# Patient Record
Sex: Female | Born: 1955 | Race: Black or African American | Hispanic: No | Marital: Single | Smoking: Never smoker
Health system: Southern US, Community
[De-identification: ages and names within clinical notes are randomized; demographics above are authoritative.]

## PROBLEM LIST (undated history)

## (undated) DIAGNOSIS — E119 Type 2 diabetes mellitus without complications: Secondary | ICD-10-CM

## (undated) DIAGNOSIS — I10 Essential (primary) hypertension: Secondary | ICD-10-CM

## (undated) HISTORY — DX: Essential (primary) hypertension: I10

## (undated) HISTORY — DX: Type 2 diabetes mellitus without complications: E11.9

## (undated) HISTORY — PX: CATARACT EXTRACTION, BILATERAL: SHX1313

---

## 2000-08-13 ENCOUNTER — Other Ambulatory Visit (HOSPITAL_COMMUNITY): Admission: RE | Admit: 2000-08-13 | Discharge: 2000-08-24 | Payer: Self-pay | Admitting: Psychiatry

## 2003-04-14 ENCOUNTER — Encounter: Admission: RE | Admit: 2003-04-14 | Discharge: 2003-05-28 | Payer: Self-pay | Admitting: Occupational Medicine

## 2003-06-19 ENCOUNTER — Encounter: Payer: Self-pay | Admitting: Occupational Medicine

## 2003-06-19 ENCOUNTER — Encounter: Admission: RE | Admit: 2003-06-19 | Discharge: 2003-06-19 | Payer: Self-pay | Admitting: Occupational Medicine

## 2004-02-03 ENCOUNTER — Encounter: Admission: RE | Admit: 2004-02-03 | Discharge: 2004-02-03 | Payer: Self-pay | Admitting: Family Medicine

## 2004-02-09 ENCOUNTER — Encounter: Admission: RE | Admit: 2004-02-09 | Discharge: 2004-05-09 | Payer: Self-pay | Admitting: Family Medicine

## 2004-02-10 ENCOUNTER — Encounter: Admission: RE | Admit: 2004-02-10 | Discharge: 2004-02-10 | Payer: Self-pay | Admitting: Family Medicine

## 2004-02-16 ENCOUNTER — Encounter: Admission: RE | Admit: 2004-02-16 | Discharge: 2004-02-16 | Payer: Self-pay | Admitting: Sports Medicine

## 2004-02-16 ENCOUNTER — Encounter: Admission: RE | Admit: 2004-02-16 | Discharge: 2004-02-16 | Payer: Self-pay | Admitting: Family Medicine

## 2004-02-18 ENCOUNTER — Encounter: Admission: RE | Admit: 2004-02-18 | Discharge: 2004-02-18 | Payer: Self-pay | Admitting: Sports Medicine

## 2004-03-08 ENCOUNTER — Encounter: Admission: RE | Admit: 2004-03-08 | Discharge: 2004-03-08 | Payer: Self-pay | Admitting: Sports Medicine

## 2004-04-20 ENCOUNTER — Encounter: Admission: RE | Admit: 2004-04-20 | Discharge: 2004-04-20 | Payer: Self-pay | Admitting: Family Medicine

## 2004-08-15 ENCOUNTER — Ambulatory Visit: Payer: Self-pay | Admitting: Family Medicine

## 2004-08-18 ENCOUNTER — Encounter: Admission: RE | Admit: 2004-08-18 | Discharge: 2004-11-16 | Payer: Self-pay | Admitting: Family Medicine

## 2004-09-13 ENCOUNTER — Ambulatory Visit: Payer: Self-pay | Admitting: Sports Medicine

## 2005-02-10 ENCOUNTER — Ambulatory Visit: Payer: Self-pay | Admitting: Family Medicine

## 2005-02-13 ENCOUNTER — Ambulatory Visit: Payer: Self-pay | Admitting: Family Medicine

## 2005-02-24 ENCOUNTER — Ambulatory Visit: Payer: Self-pay | Admitting: Family Medicine

## 2005-04-10 ENCOUNTER — Ambulatory Visit: Payer: Self-pay | Admitting: Family Medicine

## 2005-10-11 ENCOUNTER — Ambulatory Visit: Payer: Self-pay | Admitting: Family Medicine

## 2006-03-27 ENCOUNTER — Encounter (INDEPENDENT_AMBULATORY_CARE_PROVIDER_SITE_OTHER): Payer: Self-pay | Admitting: *Deleted

## 2006-03-27 LAB — CONVERTED CEMR LAB

## 2006-04-24 ENCOUNTER — Ambulatory Visit: Payer: Self-pay | Admitting: Family Medicine

## 2006-06-21 ENCOUNTER — Ambulatory Visit: Payer: Self-pay | Admitting: Sports Medicine

## 2006-06-22 ENCOUNTER — Ambulatory Visit: Payer: Self-pay | Admitting: Family Medicine

## 2006-08-10 ENCOUNTER — Ambulatory Visit: Payer: Self-pay | Admitting: Family Medicine

## 2007-01-17 ENCOUNTER — Ambulatory Visit: Payer: Self-pay | Admitting: Family Medicine

## 2007-01-24 DIAGNOSIS — E785 Hyperlipidemia, unspecified: Secondary | ICD-10-CM | POA: Insufficient documentation

## 2007-01-24 DIAGNOSIS — F339 Major depressive disorder, recurrent, unspecified: Secondary | ICD-10-CM

## 2007-01-24 DIAGNOSIS — J309 Allergic rhinitis, unspecified: Secondary | ICD-10-CM | POA: Insufficient documentation

## 2007-01-24 DIAGNOSIS — D509 Iron deficiency anemia, unspecified: Secondary | ICD-10-CM

## 2007-01-24 DIAGNOSIS — E119 Type 2 diabetes mellitus without complications: Secondary | ICD-10-CM | POA: Insufficient documentation

## 2007-01-24 DIAGNOSIS — E669 Obesity, unspecified: Secondary | ICD-10-CM | POA: Insufficient documentation

## 2007-01-24 DIAGNOSIS — D649 Anemia, unspecified: Secondary | ICD-10-CM | POA: Insufficient documentation

## 2007-01-25 ENCOUNTER — Encounter (INDEPENDENT_AMBULATORY_CARE_PROVIDER_SITE_OTHER): Payer: Self-pay | Admitting: *Deleted

## 2007-04-10 ENCOUNTER — Encounter: Payer: Self-pay | Admitting: Family Medicine

## 2007-04-18 ENCOUNTER — Encounter: Payer: Self-pay | Admitting: Family Medicine

## 2007-04-18 ENCOUNTER — Ambulatory Visit: Payer: Self-pay | Admitting: Family Medicine

## 2007-04-18 LAB — CONVERTED CEMR LAB: Hgb A1c MFr Bld: 5.1 %

## 2007-06-27 ENCOUNTER — Encounter: Payer: Self-pay | Admitting: Family Medicine

## 2007-11-27 ENCOUNTER — Emergency Department (HOSPITAL_COMMUNITY): Admission: EM | Admit: 2007-11-27 | Discharge: 2007-11-27 | Payer: Self-pay | Admitting: Emergency Medicine

## 2008-11-17 ENCOUNTER — Telehealth: Payer: Self-pay | Admitting: *Deleted

## 2008-11-18 ENCOUNTER — Encounter: Payer: Self-pay | Admitting: *Deleted

## 2011-01-13 ENCOUNTER — Encounter: Payer: Self-pay | Admitting: *Deleted

## 2017-05-11 ENCOUNTER — Ambulatory Visit (INDEPENDENT_AMBULATORY_CARE_PROVIDER_SITE_OTHER): Payer: Self-pay | Admitting: Family Medicine

## 2017-05-11 ENCOUNTER — Encounter: Payer: Self-pay | Admitting: Family Medicine

## 2017-05-11 VITALS — BP 122/60 | HR 80 | Temp 98.2°F | Ht 63.0 in | Wt 162.0 lb

## 2017-05-11 DIAGNOSIS — E119 Type 2 diabetes mellitus without complications: Secondary | ICD-10-CM

## 2017-05-11 DIAGNOSIS — D509 Iron deficiency anemia, unspecified: Secondary | ICD-10-CM

## 2017-05-11 LAB — POCT GLYCOSYLATED HEMOGLOBIN (HGB A1C): Hemoglobin A1C: 13.5

## 2017-05-11 MED ORDER — INSULIN GLARGINE 100 UNIT/ML SOLOSTAR PEN: 10.0000 [IU] | PEN_INJECTOR | Freq: Every day | SUBCUTANEOUS | 99 refills | Status: DC

## 2017-05-11 NOTE — Patient Instructions (Addendum)
Mary Swanson, you were seen today to reestablish care. Unfortunately your hemoglobin A1C was 13.5 today.    We are starting you on lantus (insulin) 10U daily.  You received 10U today in the office. If your glucose level is >120 tomorrow morning, please take 11U tomorrow night.  Continue to check your sugars every morning and increase by 1U each night if your sugars are >120.   I will follow up with you regarding the rest of your results.   Please follow up in two weeks and call me on Monday with your sugar readings.    Please follow up with Annice PihJackie (front office) to schedule a meeting to get an orange card.  This will help you pay for medical care.   We have a lot to catch you up on, but I would like to make this as affordable as possible.   Very nice seeing you, Mary Swanson L. Myrtie SomanWarden, MD 05/11/2017 5:15 PM

## 2017-05-11 NOTE — Assessment & Plan Note (Signed)
History of type 2 diabetes previously on metformin and lisinopril, but has not taken medication since 2009.  Hemoglobin A1C to 13.5 today and complaining of increased thirst and urination.  Consulted Dr. Raymondo BandKoval who provided patient with insulin education and demonstration.   -administered 10U lantus in office -provided lantus solostar pen with instructions to increase by 1U nightly if CBGs >120 - f/u BMP to assess creatinine and will start lisinopril - patient to call with CBGs - f/u 2 weeks

## 2017-05-11 NOTE — Progress Notes (Signed)
    Subjective:  Mary Swanson is a 61 y.o. female [facility]   HPI:  Mary Swanson is a 61yo F presenting today to establish care. She has a history of type 2 diabetes had a left eye cataract surgery done 4/18 and was set to have right eye cataract surgery on 05/09/17 was noted to have glucose to 345 and was not cleared for surgery. She was previous treated with metformin 500mg  BID and her sugars improved and she went down to 500mg  daily.  Also on lisinopril 5mg  daily for kidney protection. Does report increased thirst and urination, but no vision changes.   Denies weakness, SOB, chest pain or LE edema.  No changes in urination, bowel habits, vaginal bleeding or discharge.  Has never had a colonoscopy, had mammogram in 2010 or 2011 and last pap smear was probably 2009 or 2010.    PMH: type 2 diabetes Tobacco use: no tobacco Medication: reviewed and updated ROS: see HPI   Objective:  Physical Exam: BP 122/60   Pulse 80   Temp 98.2 F (36.8 C) (Oral)   Ht 5\' 3"  (1.6 m)   Wt 162 lb (73.5 kg)   SpO2 97%   BMI 28.70 kg/m   Gen: 60yo F in NAD, resting comfortably CV: RRR with no murmurs appreciated Pulm: NWOB, CTAB with no crackles, wheezes, or rhonchi GI: Normal bowel sounds present. Soft, Nontender, Nondistended. MSK: no edema, cyanosis, or clubbing noted Skin: warm, dry Neuro: grossly normal, moves all extremities Psych: Normal affect and thought content  Results for orders placed or performed in visit on 05/11/17 (from the past 72 hour(s))  POCT glycosylated hemoglobin (Hb A1C)     Status: Abnormal   Collection Time: 05/11/17  4:21 PM  Result Value Ref Range   Hemoglobin A1C 13.5      Assessment/Plan:  DIABETES MELLITUS II, UNCOMPLICATED History of type 2 diabetes previously on metformin and lisinopril, but has not taken medication since 2009.  Hemoglobin A1C to 13.5 today and complaining of increased thirst and urination.  Consulted Dr. Raymondo BandKoval  who provided patient with insulin education and demonstration.   -administered 10U lantus in office -provided lantus solostar pen with instructions to increase by 1U nightly if CBGs >120 - f/u BMP to assess creatinine and will start lisinopril - patient to call with CBGs - f/u 2 weeks  Healthcare maintenance: Patient has had no primary care in nearly 10 years.  She is due for just about everything.  At this point, I wanted to address life threatening issues (A1C of 13.5). Currently has no insurance and will need to discuss priorities with patient that are cost effective for her.  - arrange for orange card eval on Monday - vaccinations at health department - sign up for medicaid?

## 2017-05-12 LAB — BASIC METABOLIC PANEL
BUN / CREAT RATIO: 14 (ref 12–28)
BUN: 10 mg/dL (ref 8–27)
CALCIUM: 10.4 mg/dL — AB (ref 8.7–10.3)
CHLORIDE: 101 mmol/L (ref 96–106)
CO2: 24 mmol/L (ref 20–29)
CREATININE: 0.71 mg/dL (ref 0.57–1.00)
GFR calc non Af Amer: 92 mL/min/{1.73_m2} (ref >59)
GFR, EST AFRICAN AMERICAN: 106 mL/min/{1.73_m2} (ref >59)
Glucose: 298 mg/dL — ABNORMAL HIGH (ref 65–99)
Potassium: 4.5 mmol/L (ref 3.5–5.2)
Sodium: 142 mmol/L (ref 134–144)

## 2017-05-12 LAB — CBC
HEMOGLOBIN: 14.8 g/dL (ref 11.1–15.9)
Hematocrit: 44.2 % (ref 34.0–46.6)
MCH: 31.9 pg (ref 26.6–33.0)
MCHC: 33.5 g/dL (ref 31.5–35.7)
MCV: 95 fL (ref 79–97)
PLATELETS: 232 10*3/uL (ref 150–379)
RBC: 4.64 x10E6/uL (ref 3.77–5.28)
RDW: 12.6 % (ref 12.3–15.4)
WBC: 4.6 10*3/uL (ref 3.4–10.8)

## 2017-05-14 ENCOUNTER — Telehealth: Payer: Self-pay | Admitting: Family Medicine

## 2017-05-14 NOTE — Telephone Encounter (Signed)
Saturday blood sugar was 338 and she injected herself with 11units at 10pm. Sunday:  Blood sugar at 10am 296 and she injected herself with 12 units. Today her blood sugar was 304 at 9:30. These are fasting readings. Please advise

## 2017-05-14 NOTE — Telephone Encounter (Signed)
Called patient to discuss elevated blood sugars >300.  She was set to take 13U lantus this evening, but I instructed her to increase to 20U this evening.  If AM sugars >120, she will continue to increase by 1U daily until we meet on 7/3.  Dr. Abelardo DieselMcMullen will be covering my inbox over the next week and I have discussed this with him.   Additionally, spoke with Annice PihJackie this morning about setting patient up with orange card and she will be calling her.  I have also asked her to call the office tomorrow or Wednesday and come by to pick up additional solostar pens until her orange card kicks in.

## 2017-05-15 NOTE — Progress Notes (Signed)
Medication Samples have been provided to the patient.  Drug name: Lantus solostar       Strength: 100units/mL        Qty: 1  LOT: [id number]: [id number]  Exp.Date: 08/27/2019  Dosing instructions: 21 + 1 units daily titration   The patient has been instructed regarding the correct time, dose, and frequency of taking this medication, including desired effects and most common side effects.   Katherine MantleHeysel Chantrell Apsey, PharmD candidate 8:53 AM 05/15/2017

## 2017-05-17 ENCOUNTER — Telehealth: Payer: Self-pay | Admitting: Family Medicine

## 2017-05-17 NOTE — Telephone Encounter (Signed)
Pt came to the office. I gave her the medication from the fridge. I also explained that Dr. Myrtie SomanWarden is out of the office and it will be Monday before I have an answer on the Rx for the Lisinopril. Pt understood. Sunday SpillersSharon T Kelley Knoth, CMA

## 2017-05-17 NOTE — Telephone Encounter (Signed)
Pt called because she will be coming up her to pick up her medication that is in the Fridge. Also she needs a prescription of Lisinopril. Can we call this to outpatient pharmacy 30 day supply that we can take from our fund for her? She is in the process of getting her FA approved. She is going to be up her at 10:30-11:00 this morning. jw

## 2017-05-17 NOTE — Telephone Encounter (Signed)
The pt is asking for Lisinopril but I am not seeing it on her med list. We would need a Rx before I can go any further. Dr. Myrtie SomanWarden please advise. Sunday SpillersSharon T Meagon Duskin, CMA

## 2017-05-17 NOTE — Telephone Encounter (Signed)
According to last office visit by Dr. Myrtie SomanWarden, patient previously on lisinopril but discontinued since 2009. Patient will need to return for follow-up visit to reassess if lisinopril is indicated. Thank you. -- Durward Parcelavid Mateen Franssen, DO [facility], PGY-1

## 2017-05-21 ENCOUNTER — Telehealth: Payer: Self-pay | Admitting: Family Medicine

## 2017-05-21 NOTE — Telephone Encounter (Signed)
Pt came into the office. She had been given samples of BD Ultra fined pen needles but she has run out. She went to the drug store and a box of 100 cost $45.  She doesn't want to buy that many because she doesn't know how long she will be on insulin. She had fasting reading of 125 and 96. Please advise

## 2017-05-22 NOTE — Telephone Encounter (Signed)
Patient is currently working on obtaining orange card is my understanding. In the meantime, should be able to use samples from the clinic until this is obtained. I would advise her to continue checking her blood sugar levels as her A1c indicates poorly controlled diabetes. Thanks. -- Durward Parcelavid McMullen, DO [facility], PGY-1

## 2017-05-25 NOTE — Telephone Encounter (Signed)
Patient informed, expressed understanding. 

## 2017-05-28 NOTE — Progress Notes (Signed)
Subjective:  Mary Swanson is a 61 y.o. female who [facility] for [facility] follow up  HPI:  DIABETES Type II:  Patient seen on 05/11/17 and noted to have A1C to 13.5. At that time was prescribed lantus 10U and has been increasing 1U daily if sugars >120. Currently at 31U lantus at night.  Cr is WNL and did not start ace inhibitor.  Medications: Reports taking and tolerating without side effects. Blood Sugars per patient: Fasting: 75-258, High: 258, Low:75 (only hypoglycemic episode) Diet-breakfast: oatmeal, few blueberries and sometimes some peanut butter.  Drinks water during day and no juice/sodas.  Snacks: frozen veggies, dinner: tuna w/ veggies and maybe some brown rice.  Regular Exercise: stationary bike, treadmill and weight lifting 3-4x week  Health Maintenance Due  Topic Date Due  . Hepatitis C Screening  [birth year]  . PNEUMOCOCCAL POLYSACCHARIDE VACCINE (1) 09/11/1956  . FOOT EXAM  09/11/1964  . OPHTHALMOLOGY EXAM  09/11/1964  . URINE MICROALBUMIN  09/11/1964  . HIV Screening  09/11/1969  . MAMMOGRAM  09/11/2004  . COLONOSCOPY  09/11/2004  . PAP SMEAR  03/27/2009  . TETANUS/TDAP  01/25/2014   On Aspirin- 81mg  On statin-need to check LFTs first Last eye exam:  Last foot exam: Diabetic Foot Exam - Simple   Simple Foot Form Diabetic Foot exam was performed with the following findings:  Yes 05/29/2017  5:23 PM  Visual Inspection No deformities, no ulcerations, no other skin breakdown bilaterally:  Yes Sensation Testing Intact to touch and monofilament testing bilaterally:  Yes Pulse Check Posterior Tibialis and Dorsalis pulse intact bilaterally:  Yes Comments    Nephropathy screen: next visit, patient has no insurance  ROS- Denies Polyuria,Polydipsia, nocturia, Vision changes, feet or hand numbness/pain/tingling. Denies  Hypoglycemia symptoms (shaky, sweaty, hungry, weak anxious, tremor, palpitations, confusion, behavior change).   Hemoglobin a1c:    Lab Results  Component Value Date   HGBA1C 13.5 05/11/2017   HGBA1C 5.1 04/18/2007     PMH: type 2 diabetes Tobacco use: none Medication: reviewed and updated ROS: see HPI   Objective:  Physical Exam: BP 122/60   Pulse 86   Temp 98.4 F (36.9 C) (Oral)   Ht 5\' 3"  (1.6 m)   Wt 172 lb 3.2 oz (78.1 kg)   SpO2 97%   BMI 30.50 kg/m   Gen: 60yo F in NAD, resting comfortably CV: RRR with systolic murmur heard best at the RUSB Pulm: NWOB, CTAB with no crackles, wheezes, or rhonchi GI: Normal bowel sounds present. Soft, Nontender, Nondistended. MSK: no edema, cyanosis, or clubbing noted Skin: warm, dry Extremities: bilateral ankle edema Neuro: grossly normal, moves all extremities Psych: Normal affect and thought content No results found for this or any previous visit (from the past 72 hour(s)).   Assessment/Plan:  Heart murmur, systolic New systolic murmur. Patient denies SOB or chest pain.  Does have a history of anemia, but hgb was WNL last visit in June.  Does have some new onset ankle edema, but this does not appear to be cardiac related given clear lungs and no symptoms with ambulation or changes in position.  - will hold off on workup until patient has orange card  DIABETES MELLITUS II, UNCOMPLICATED Presenting today for diabetes follow up and has had a few low sugars with the lowest being 75, but denies any hypoglycemic episodes.  Currently on 31U lantus at night and reports good diet and has started exercising.  Educated patient on proper diet.  Given fluctuations in sugars, recommended sticking with 28U lantus and asked her to call me if any <85.  Orange card still pending, so provided her with a card for month supply solostar pen.  - 28U lantus at night, call if sugars <85 - continue 81mg  ASA daily - holding on statin until I can check LFTs (no insurance)

## 2017-05-29 ENCOUNTER — Encounter: Payer: Self-pay | Admitting: Family Medicine

## 2017-05-29 ENCOUNTER — Ambulatory Visit (INDEPENDENT_AMBULATORY_CARE_PROVIDER_SITE_OTHER): Payer: Self-pay | Admitting: Family Medicine

## 2017-05-29 VITALS — BP 122/60 | HR 86 | Temp 98.4°F | Ht 63.0 in | Wt 172.2 lb

## 2017-05-29 DIAGNOSIS — E119 Type 2 diabetes mellitus without complications: Secondary | ICD-10-CM

## 2017-05-29 DIAGNOSIS — Z794 Long term (current) use of insulin: Secondary | ICD-10-CM

## 2017-05-29 DIAGNOSIS — R011 Cardiac murmur, unspecified: Secondary | ICD-10-CM | POA: Insufficient documentation

## 2017-05-29 MED ORDER — INSULIN GLARGINE 100 UNIT/ML SOLOSTAR PEN
30.0000 [IU] | PEN_INJECTOR | Freq: Every day | SUBCUTANEOUS | 99 refills | Status: DC
Start: 2017-05-29 — End: 2018-09-18

## 2017-05-29 NOTE — Patient Instructions (Addendum)
   Mary Swanson it was very nice seeing you today.  I want you to take 28units of lantus every night. If you have sugars <85 please call me. We discussed diet and the details are below.   As your your swelling in your ankles I would recommend elevating your legs and some compression socks.    Please call me with any issues and come back and see me in one month.  I gave you a card to get one month of lantus at the pharmacy.   Take care, Mary Uhrich L. Myrtie SomanWarden, MD [facility] Resident PGY-2 05/29/2017 4:44 PM        Diet Recommendations for Diabetes   Starchy (carb) foods: Bread, rice, pasta, potatoes, corn, cereal, grits, crackers, bagels, muffins, all baked goods.  (Fruits, milk, and yogurt also have carbohydrate, but most of these foods will not spike your blood sugar as most starchy foods will.)  A few fruits do cause high blood sugars; use small portions of bananas (limit to 1/2 at a time), grapes, watermelon, oranges, and most tropical fruits.    Protein foods: Meat, fish, poultry, eggs, dairy foods, and beans such as pinto and kidney beans (beans also provide carbohydrate).   1. Eat at least 3 meals and 1-2 snacks per day. Never go more than 4-5 hours while awake without eating. Eat breakfast within the first hour of getting up.   2. Limit starchy foods to TWO per meal and ONE per snack. ONE portion of a starchy  food is equal to the following:   - ONE slice of bread (or its equivalent, such as half of a hamburger bun).   - 1/2 cup of a "scoopable" starchy food such as potatoes or rice.   - 15 grams of Total Carbohydrate as shown on food label.  3. Include at every meal: a protein food, a carb food, and vegetables and/or fruit.   - Obtain twice the volume of veg's as protein or carbohydrate foods for both lunch and dinner.   - Fresh or frozen veg's are best.   - Keep frozen veg's on hand for a quick vegetable serving.

## 2017-05-29 NOTE — Assessment & Plan Note (Signed)
Presenting today for diabetes follow up and has had a few low sugars with the lowest being 75, but denies any hypoglycemic episodes.  Currently on 31U lantus at night and reports good diet and has started exercising.  Educated patient on proper diet.  Given fluctuations in sugars, recommended sticking with 28U lantus and asked her to call me if any <85.  Orange card still pending, so provided her with a card for month supply solostar pen.  - 28U lantus at night, call if sugars <85 - continue 81mg  ASA daily - holding on statin until I can check LFTs (no insurance)

## 2017-05-29 NOTE — Assessment & Plan Note (Signed)
New systolic murmur. Patient denies SOB or chest pain.  Does have a history of anemia, but hgb was WNL last visit in June.  Does have some new onset ankle edema, but this does not appear to be cardiac related given clear lungs and no symptoms with ambulation or changes in position.  - will hold off on workup until patient has orange card

## 2017-05-31 ENCOUNTER — Encounter: Payer: Self-pay | Admitting: Family Medicine

## 2017-05-31 ENCOUNTER — Telehealth: Payer: Self-pay | Admitting: Family Medicine

## 2017-05-31 NOTE — Telephone Encounter (Signed)
Informed pt of the below information. Pt had good understanding. Sunday SpillersSharon T Vikki Swanson, CMA

## 2017-05-31 NOTE — Telephone Encounter (Signed)
Please ask patient to decrease to 26U and call me on Monday with her fasting sugars from 7/6, 7/7 and 7/8 and we can see if we need to adjust from there.  I'll send some needles.    Thanks so much, Dr. Myrtie SomanWarden

## 2017-05-31 NOTE — Telephone Encounter (Signed)
Her blood sugar today at 6;30 this morning was 81--fasting. She doesn't feel bad.  Dr Myrtie Somanwarden wanted pt to report when her BS fell below 100. She needs more needles.

## 2017-06-01 NOTE — Telephone Encounter (Signed)
Pt still needs needles. She would prefer sample needles. She hasnt received Medicaid or the orange card. Please advise

## 2017-06-05 ENCOUNTER — Other Ambulatory Visit: Payer: Self-pay | Admitting: Family Medicine

## 2017-06-05 ENCOUNTER — Telehealth: Payer: Self-pay

## 2017-06-05 MED ORDER — INSULIN PEN NEEDLE 32G X 4 MM MISC: 1.0000 | Freq: Every morning | 30 refills | Status: DC

## 2017-06-05 NOTE — Telephone Encounter (Signed)
Pt needs refill on insulin needles, Novo Fine Plus 32G, or BD nano. She is Needs enough to last to August 3rd. approx 30. She is completely out of needles. Sunday SpillersSharon T Saunders, CMA

## 2017-06-05 NOTE — Telephone Encounter (Signed)
Send in some BD nano.  Thanks, Rande Bruntaniel L. Myrtie SomanWarden, MD [facility] Resident PGY-2 06/05/2017 5:33 PM

## 2017-06-07 ENCOUNTER — Other Ambulatory Visit: Payer: Self-pay | Admitting: Family Medicine

## 2017-06-07 NOTE — Telephone Encounter (Signed)
[facility]. Pt informed. Sunday SpillersSharon T Eliane Hammersmith, CMA

## 2017-06-07 NOTE — Telephone Encounter (Signed)
Still hasnt received her needles. She hasnt had an injection in 4 days.

## 2017-06-11 ENCOUNTER — Telehealth: Payer: Self-pay

## 2017-06-11 NOTE — Telephone Encounter (Signed)
Pt left an envelope for Dr. Myrtie SomanWarden at the front desk. No form to fill out. Information on [facility] Screening and questions from the pt to Dr. Myrtie SomanWarden. Envelope put in Dr. Kristopher OppenheimWarden's box. No form to fill out. Sunday SpillersSharon T Saunders, CMA

## 2017-06-29 ENCOUNTER — Ambulatory Visit: Payer: Self-pay | Admitting: Family Medicine

## 2018-08-28 ENCOUNTER — Ambulatory Visit: Payer: Self-pay | Admitting: Internal Medicine

## 2018-09-18 ENCOUNTER — Encounter: Payer: Self-pay | Admitting: Internal Medicine

## 2018-09-18 ENCOUNTER — Ambulatory Visit: Payer: Self-pay | Admitting: Internal Medicine

## 2018-09-18 VITALS — BP 142/80 | HR 76 | Resp 12 | Ht 62.25 in | Wt 171.0 lb

## 2018-09-18 DIAGNOSIS — E119 Type 2 diabetes mellitus without complications: Secondary | ICD-10-CM

## 2018-09-18 DIAGNOSIS — I10 Essential (primary) hypertension: Secondary | ICD-10-CM

## 2018-09-18 DIAGNOSIS — E785 Hyperlipidemia, unspecified: Secondary | ICD-10-CM

## 2018-09-18 MED ORDER — INSULIN NPH (HUMAN) (ISOPHANE) 100 UNIT/ML ~~LOC~~ SUSP: 12.0000 [IU] | Freq: Two times a day (BID) | SUBCUTANEOUS | 11 refills | Status: DC

## 2018-09-18 MED ORDER — LISINOPRIL 5 MG PO TABS: ORAL_TABLET | ORAL | 11 refills | Status: DC

## 2018-09-18 MED ORDER — NOVOLIN R RELION 100 UNIT/ML IJ SOLN: INTRAMUSCULAR | 0 refills | Status: DC

## 2018-09-18 MED ORDER — METFORMIN HCL ER 500 MG PO TB24: ORAL_TABLET | ORAL | 11 refills | Status: DC

## 2018-09-18 MED ORDER — INSULIN GLARGINE 100 UNIT/ML SOLOSTAR PEN: PEN_INJECTOR | SUBCUTANEOUS | 99 refills | Status: DC

## 2018-09-18 MED ORDER — INSULIN LISPRO 100 UNIT/ML (KWIKPEN): PEN_INJECTOR | SUBCUTANEOUS | 11 refills | Status: DC

## 2018-09-18 NOTE — Progress Notes (Signed)
   Subjective:    Patient ID: Mary Swanson, female    DOB: 1956/01/16, 62 y.o.   MRN: [id number]  HPI  Here to establish Time limited as work in as missed morning appt  1. DM:  Was hospitalized in September at [facility] with sugars above 600.  Had been previously diagnosed with prediabetes 14 years ago and then found to have eye changes in 2018 and subsequently diagnosed with DM.  She stopped taking her oral medications as she thought she was doing okay.  Records from July 2018 do not support this history--appears diagnosed with DM in 2009 and stopped taking oral meds then.  She was actually started on glargine insulin in 2018 when established with [facility].  C peptide was 1.18 (0.81 - 3.85) She is on twice daily dosing of NPH 12 units twice daily and Regular 6 units twice daily + sliding scale insulin.   She is no longer having polyuria or polydipsia as she was before her hospitalization. She did not get her cholesterol checked during the hospitalization. Obtained flu vaccine last day of hospitalization. Not sure about pneumococcal, but did see something in her chart with [facility]--will need to find again.  2.  Hypertension:  Taking Lisinopril 2.5 mg daily and bp still a bit high.    Current Meds  Medication Sig  . aspirin 81 MG chewable tablet Chew 81 mg by mouth. 1 every other day  . insulin NPH Human (HUMULIN N,NOVOLIN N) 100 UNIT/ML injection Inject 12 Units into the skin 2 (two) times daily before a meal.   . lisinopril (PRINIVIL,ZESTRIL) 2.5 MG tablet Take by mouth daily.   . metFORMIN (GLUCOPHAGE-XR) 500 MG 24 hr tablet TAKE 1 TABLET BY MOUTH ONCE DAILY AT 6PM  . NOVOLIN R RELION 100 UNIT/ML injection INJECT 2 12 UNITS INTO THE SKIN 3 TIMES DAILY BEFORE MEALS. CHECK BLOOD SUGAR PRIOR TO EACH MEAL.    Allergies  Allergen Reactions  . Erythromycin Diarrhea  . Latex Hives    Review of Systems     Objective:   Physical Exam Odor of old urine NAD Lungs:  CTA CV:  RRR  with normal S1 and S2, No S3, S4 or murmur.  Radial and DP pulses normal and equal Abd:  S, NT, No HSM or mass, + bs LE:  No edema    Assessment & Plan:  1.  DM:  Needs orange card, but can have her get set up with [facility] at [facility].  Not clear how long this will take. Will send in Rx also for NPH and regular to [facility]  Sending Lantus and Humalog to [facility] and to call if gets this quickly --do not pick up the insulin at [facility] then. Checking on immunizations Went over documenting blood sugars.  2.  Hypertension:  Increase Lisinopril to 5 mg daily and return in 2 weeks for fasting labs:  FLP, BMP with bp check.  3.  Hyperlipidemia:  Found this listed in problem list, though unable to find the labs.  Labs in 2 weeks.

## 2018-09-18 NOTE — Patient Instructions (Addendum)
Drink a glass of water before every meal Drink 6-8 glasses of water daily Eat three meals daily Eat a protein and healthy fat with every meal (eggs,fish, chicken, [place] and limit red meats) Eat 5 servings of vegetables daily, mix the colors Eat 2 servings of fruit daily with skin, if skin is edible Use smaller plates Put food/utensils down as you chew and swallow each bite Eat at a table with friends/family at least once daily, no TV Do not eat in front of the TV  Recent studies show that people who consume all of their calories in a 12 hour period lose weight more efficiently.  For example, if you eat your first meal at 7:00 a.m., your last meal of the day should be completed by 7:00 p.m.  If you get the Lantus and Humalog insulin at [facility], do not pick up the N and R insulin at [facility]

## 2018-09-19 ENCOUNTER — Encounter: Payer: Self-pay | Admitting: Internal Medicine

## 2018-09-19 NOTE — Progress Notes (Signed)
Social Work Barrister's clerk completed new patient screening to  assess for mental health status and social determinants of health. Patient stated that she has been experiencing some anhedonia, stress and fatigue. She also disclosed having some stress due to life circumstances.  She denied any suicidal thoughts. Patient stated she received assistance once this year with her utilities, but denied problems with any other SDOH. SWI gave her resource guide. She stated that she does not want counseling at this time, but is opened to receiving a call from [facility] in a week  to check in.

## 2018-10-04 ENCOUNTER — Other Ambulatory Visit: Payer: Self-pay

## 2018-10-04 DIAGNOSIS — E785 Hyperlipidemia, unspecified: Secondary | ICD-10-CM

## 2018-10-04 DIAGNOSIS — Z79899 Other long term (current) drug therapy: Secondary | ICD-10-CM

## 2018-10-05 LAB — BASIC METABOLIC PANEL
BUN/Creatinine Ratio: 18 (ref 12–28)
BUN: 14 mg/dL (ref 8–27)
CALCIUM: 10 mg/dL (ref 8.7–10.3)
CO2: 24 mmol/L (ref 20–29)
Chloride: 104 mmol/L (ref 96–106)
Creatinine, Ser: 0.76 mg/dL (ref 0.57–1.00)
GFR calc Af Amer: 96 mL/min/{1.73_m2} (ref >59)
GFR calc non Af Amer: 83 mL/min/{1.73_m2} (ref >59)
GLUCOSE: 108 mg/dL — AB (ref 65–99)
POTASSIUM: 4.7 mmol/L (ref 3.5–5.2)
Sodium: 145 mmol/L — ABNORMAL HIGH (ref 134–144)

## 2018-10-05 LAB — LIPID PANEL W/O CHOL/HDL RATIO
CHOLESTEROL TOTAL: 161 mg/dL (ref 100–199)
HDL: 61 mg/dL (ref >39)
LDL CALC: 84 mg/dL (ref 0–99)
Triglycerides: 78 mg/dL (ref 0–149)
VLDL CHOLESTEROL CAL: 16 mg/dL (ref 5–40)

## 2018-10-28 ENCOUNTER — Ambulatory Visit: Payer: Self-pay

## 2018-10-28 VITALS — BP 138/78 | HR 82

## 2018-10-28 DIAGNOSIS — I10 Essential (primary) hypertension: Secondary | ICD-10-CM

## 2018-10-28 NOTE — Progress Notes (Signed)
Patient BP better. Patient has still been taking Lisinopril 2.5 mg. Patient states she wanted to finish those first. Informed she can double them then pick up Rx for 5 mg. Patient verbalized understanding. Will start 5 mg tomorrow. Patient has follow up on 11/22/18. Will recheck BP at that time.

## 2018-11-05 ENCOUNTER — Encounter (HOSPITAL_COMMUNITY): Payer: Self-pay

## 2018-11-05 ENCOUNTER — Emergency Department (HOSPITAL_COMMUNITY)
Admission: EM | Admit: 2018-11-05 | Discharge: 2018-11-06 | Disposition: A | Discharge status: Home / Self Care | Payer: Self-pay | Attending: Emergency Medicine | Admitting: Emergency Medicine

## 2018-11-05 DIAGNOSIS — Z794 Long term (current) use of insulin: Secondary | ICD-10-CM | POA: Insufficient documentation

## 2018-11-05 DIAGNOSIS — Z79899 Other long term (current) drug therapy: Secondary | ICD-10-CM | POA: Insufficient documentation

## 2018-11-05 DIAGNOSIS — I10 Essential (primary) hypertension: Secondary | ICD-10-CM | POA: Insufficient documentation

## 2018-11-05 DIAGNOSIS — E11649 Type 2 diabetes mellitus with hypoglycemia without coma: Secondary | ICD-10-CM | POA: Insufficient documentation

## 2018-11-05 DIAGNOSIS — E162 Hypoglycemia, unspecified: Secondary | ICD-10-CM

## 2018-11-05 DIAGNOSIS — Z7982 Long term (current) use of aspirin: Secondary | ICD-10-CM | POA: Insufficient documentation

## 2018-11-05 LAB — BASIC METABOLIC PANEL
ANION GAP: 9 (ref 5–15)
BUN: 15 mg/dL (ref 8–23)
CALCIUM: 9 mg/dL (ref 8.9–10.3)
CO2: 27 mmol/L (ref 22–32)
Chloride: 104 mmol/L (ref 98–111)
Creatinine, Ser: 0.71 mg/dL (ref 0.44–1.00)
GLUCOSE: 26 mg/dL — AB (ref 70–99)
POTASSIUM: 4 mmol/L (ref 3.5–5.1)
SODIUM: 140 mmol/L (ref 135–145)

## 2018-11-05 LAB — CBG MONITORING, ED
GLUCOSE-CAPILLARY: 27 mg/dL — AB (ref 70–99)
GLUCOSE-CAPILLARY: 101 mg/dL — AB (ref 70–99)
GLUCOSE-CAPILLARY: 247 mg/dL — AB (ref 70–99)
Glucose-Capillary: 35 mg/dL — CL (ref 70–99)
Glucose-Capillary: 362 mg/dL — ABNORMAL HIGH (ref 70–99)

## 2018-11-05 LAB — CBC WITH DIFFERENTIAL/PLATELET
ABS IMMATURE GRANULOCYTES: 0.01 10*3/uL (ref 0.00–0.07)
Basophils Absolute: 0 10*3/uL (ref 0.0–0.1)
Basophils Relative: 0 %
Eosinophils Absolute: 0.1 10*3/uL (ref 0.0–0.5)
Eosinophils Relative: 1 %
HCT: 41.5 % (ref 36.0–46.0)
HEMOGLOBIN: 13 g/dL (ref 12.0–15.0)
IMMATURE GRANULOCYTES: 0 %
LYMPHS PCT: 18 %
Lymphs Abs: 1.1 10*3/uL (ref 0.7–4.0)
MCH: 31 pg (ref 26.0–34.0)
MCHC: 31.3 g/dL (ref 30.0–36.0)
MCV: 98.8 fL (ref 80.0–100.0)
MONO ABS: 0.5 10*3/uL (ref 0.1–1.0)
MONOS PCT: 8 %
NEUTROS ABS: 4.5 10*3/uL (ref 1.7–7.7)
NEUTROS PCT: 73 %
PLATELETS: 258 10*3/uL (ref 150–400)
RBC: 4.2 MIL/uL (ref 3.87–5.11)
RDW: 11.4 % — AB (ref 11.5–15.5)
WBC: 6.2 10*3/uL (ref 4.0–10.5)
nRBC: 0 % (ref 0.0–0.2)

## 2018-11-05 MED ORDER — GLUCOSE 40 % PO GEL
ORAL | Status: AC
  Administered 2018-11-05: 37.5 g via ORAL
  Filled 2018-11-05: qty 1

## 2018-11-05 MED ORDER — GLUCOSE 40 % PO GEL
1.0000 | ORAL | Status: DC | PRN
  Administered 2018-11-05: 37.5 g via ORAL

## 2018-11-05 MED ORDER — DEXTROSE 50 % IV SOLN
0.5000 | Freq: Once | INTRAVENOUS | Status: AC
  Administered 2018-11-05: 25 mL via INTRAVENOUS
  Filled 2018-11-05: qty 50

## 2018-11-05 NOTE — Discharge Instructions (Addendum)
For now:  Go back to taking the Metformin TWICE a day, instead of THREE times a day.   For your insulin:  YOU SHOULD ONLY BE TAKING YOUR NOVOLIN-N TWICE A DAY, NOT THREE TIMES A DAY; TAKE THIS WITH BREAKFAST AND DINNER, AND MAKE SURE YOU EAT AFTER EVERY DOSE OF INSULIN  To be safe, I recommend using only HALF the dose that is recommended based on your sliding scale. For example, if your scale says to take 12 units, take 6 units instead. So:  -Novolin N 6 units twice a day (with breakfast and dinner), instead of 12 -Novolin R 3 units three times a day with meals, instead of 6; (or half the dose you normally take)  Monitor your sugar closely  Eat frequent, small meals to help keep your blood sugar in range  CALL TO SEE YOUR DOCTOR IN THE NEXT 2-3 DAYS

## 2018-11-05 NOTE — ED Notes (Signed)
ED Provider at bedside. 

## 2018-11-05 NOTE — ED Triage Notes (Signed)
Pt is diabetic, did not each much dinner, check CBG around 5pm and it was 123. Pt cool, diaphoretic, but alert, but confused pt drinking second cup of orange juice.

## 2018-11-05 NOTE — ED Provider Notes (Signed)
[facility]   CSN: [id number] Arrival date & time: 11/05/18  2027     History   Chief Complaint Chief Complaint  Patient presents with  . Hypoglycemia    HPI Mary Swanson is a 62 y.o. female.  HPI   62 year old female with past medical history as below here with hyperglycemia.  The patient states that her primary care doctor has been adjusting her insulin doses.  She is on both Novolin and and Novolin R.  She is also on long-acting metformin 3 times daily.  She states that her blood sugar has been consistently dropping in the mornings, down to the 40s, and she feels shaky and lightheaded.  Earlier today, her blood sugar was 105.  She then took her normal dose of insulin, but forgot to eat.  She reportedly was found confused in her room, with slurred speech and closed eyes.  She did not fully lose consciousness, but was noted to have a glucose of 27 on arrival here.  She is able to drink juice and eat crackers, and now feels significantly better.  Denies any recent fevers or chills.  No other medical complaints.  No recent illnesses.  No chest pain or shortness of breath.  Past Medical History:  Diagnosis Date  . Diabetes mellitus without complication (HCC)   . Hypertension     Patient Active Problem List   Diagnosis Date Noted  . Diabetes mellitus without complication (HCC)   . Heart murmur, systolic 05/29/2017  . DIABETES MELLITUS II, UNCOMPLICATED 01/24/2007  . HYPERLIPIDEMIA 01/24/2007  . OBESITY, NOS 01/24/2007  . ANEMIA, IRON DEFICIENCY, UNSPEC. 01/24/2007  . DEPRESSION, MAJOR, RECURRENT 01/24/2007  . RHINITIS, ALLERGIC 01/24/2007    Past Surgical History:  Procedure Laterality Date  . CATARACT EXTRACTION, BILATERAL Bilateral      OB History   None      Home Medications    Prior to Admission medications   Medication Sig Start Date End Date Taking? Authorizing Provider  aspirin 81 MG chewable tablet Chew  81 mg by mouth. 1 every other day    [provider]  Insulin Glargine (LANTUS SOLOSTAR) 100 UNIT/ML Solostar Pen 30 units injected subcutaneously in the morning once daily 09/18/18   Julieanne MansonMulberry, Elizabeth, MD  insulin lispro (HUMALOG KWIKPEN) 100 UNIT/ML KiwkPen 6 units injected subcutaneously before meals 3 times daily 09/18/18   Julieanne MansonMulberry, Elizabeth, MD  insulin NPH Human (HUMULIN N,NOVOLIN N) 100 UNIT/ML injection Inject 0.12 mLs (12 Units total) into the skin 2 (two) times daily before a meal. 09/18/18   Julieanne MansonMulberry, Elizabeth, MD  Insulin Pen Needle (BD PEN NEEDLE NANO U/F) 32G X 4 MM MISC 1 Stick by Does not apply route every morning. Patient not taking: Reported on 09/18/2018 06/05/17   Renne MuscaWarden, Daniel L, MD  lisinopril (PRINIVIL,ZESTRIL) 5 MG tablet 1 tab by mouth once daily 09/18/18   Julieanne MansonMulberry, Elizabeth, MD  metFORMIN (GLUCOPHAGE-XR) 500 MG 24 hr tablet 1 tab by mouth twice daily with meals 09/18/18   Julieanne MansonMulberry, Elizabeth, MD  NOVOLIN R RELION 100 UNIT/ML injection INJECT 12 UNITS INTO THE SKIN 3 TIMES DAILY BEFORE MEALS. CHECK BLOOD SUGAR PRIOR TO EACH MEAL. 09/18/18   Julieanne MansonMulberry, Elizabeth, MD    Family History No family history on file.  Social History Social History   Tobacco Use  . Smoking status: Never Smoker  . Smokeless tobacco: Never Used  Substance Use Topics  . Alcohol use: Not on file  . Drug use:  Not on file     Allergies   Erythromycin and Latex   Review of Systems Review of Systems  Constitutional: Positive for fatigue. Negative for chills and fever.  HENT: Negative for congestion and rhinorrhea.   Eyes: Negative for visual disturbance.  Respiratory: Negative for cough, shortness of breath and wheezing.   Cardiovascular: Negative for chest pain and leg swelling.  Gastrointestinal: Negative for abdominal pain, diarrhea, nausea and vomiting.  Genitourinary: Negative for dysuria and flank pain.  Musculoskeletal: Negative for neck pain and neck stiffness.    Skin: Negative for rash and wound.  Allergic/Immunologic: Negative for immunocompromised state.  Neurological: Positive for weakness. Negative for syncope and headaches.  All other systems reviewed and are negative.    Physical Exam Updated Vital Signs BP 127/62   Pulse 91   Temp 97.6 F (36.4 C) (Tympanic)   Resp 16   SpO2 96%   Physical Exam  Constitutional: She is oriented to person, place, and time. She appears well-developed and well-nourished. No distress.  HENT:  Head: Normocephalic and atraumatic.  Eyes: Conjunctivae are normal.  Neck: Neck supple.  Cardiovascular: Normal rate, regular rhythm and normal heart sounds. Exam reveals no friction rub.  No murmur heard. Pulmonary/Chest: Effort normal and breath sounds normal. No respiratory distress. She has no wheezes. She has no rales.  Abdominal: She exhibits no distension.  Musculoskeletal: She exhibits no edema.  Neurological: She is alert and oriented to person, place, and time. She exhibits normal muscle tone.  Skin: Skin is warm. Capillary refill takes less than 2 seconds.  Psychiatric: She has a normal mood and affect.  Nursing note and vitals reviewed.    ED Treatments / Results  Labs (all labs ordered are listed, but only abnormal results are displayed) Labs Reviewed  CBC WITH DIFFERENTIAL/PLATELET - Abnormal; Notable for the following components:      Result Value   RDW 11.4 (*)    All other components within normal limits  BASIC METABOLIC PANEL - Abnormal; Notable for the following components:   Glucose, Bld 26 (*)    All other components within normal limits  CBG MONITORING, ED - Abnormal; Notable for the following components:   Glucose-Capillary 27 (*)    All other components within normal limits  CBG MONITORING, ED - Abnormal; Notable for the following components:   Glucose-Capillary 35 (*)    All other components within normal limits  CBG MONITORING, ED - Abnormal; Notable for the following  components:   Glucose-Capillary 101 (*)    All other components within normal limits  CBG MONITORING, ED - Abnormal; Notable for the following components:   Glucose-Capillary 247 (*)    All other components within normal limits  CBG MONITORING, ED - Abnormal; Notable for the following components:   Glucose-Capillary 362 (*)    All other components within normal limits    EKG EKG Interpretation  Date/Time:  Tuesday November 05 2018 22:59:42 EST Ventricular Rate:  97 PR Interval:    QRS Duration: 74 QT Interval:  364 QTC Calculation: 463 R Axis:   17 Text Interpretation:  Sinus rhythm Consider left ventricular hypertrophy No significant change since last tracing Confirmed by Shaune Pollack [phone]) on 11/05/2018 11:14:51 PM   Radiology No results found.  Procedures Procedures (including critical care time)  Medications Ordered in ED Medications  dextrose (GLUTOSE) 40 % oral gel 37.5 g (37.5 g Oral Given 11/05/18 2105)  dextrose 50 % solution 25 mL (25 mLs Intravenous Given 11/05/18  2150)     Initial Impression / Assessment and Plan / ED Course  I have reviewed the triage vital signs and the nursing notes.  Pertinent labs & imaging results that were available during my care of the patient were reviewed by me and considered in my medical decision making (see chart for details).     62 yo F with PMHx as above here w/ hypoglycemia.  I suspect this is likely secondary to taking increased doses of insulin as well as not eating.  She has no evidence of infection, ischemia, or alternative medical etiology.  On further discussion with the patient, she has been taking her NPH 3 times a day instead of twice a day, her metformin 3 times a day, as well as her regular insulin.  Given that she is persistently hypoglycemic at home, will have her decrease her metformin back to twice a day, as well as half her dose of insulin.  We discussed the importance of taking NPH only twice a day, and  she will follow-up with her PCP in 24 to 48 hours.  Her friend will stay with her.  Her blood sugars were monitored for several hours with progressive improvement.  She has remained stable.  No focal deficits.  Discharge home.  Final Clinical Impressions(s) / ED Diagnoses   Final diagnoses:  Hypoglycemia    ED Discharge Orders    None       Shaune Pollack, MD 11/06/18 [phone]

## 2018-11-05 NOTE — ED Notes (Signed)
Pt alert at this time and given orange juice to drink. Taken to treatment room.

## 2018-11-12 ENCOUNTER — Ambulatory Visit: Payer: Self-pay | Admitting: Internal Medicine

## 2018-11-12 ENCOUNTER — Encounter: Payer: Self-pay | Admitting: Internal Medicine

## 2018-11-12 VITALS — BP 130/80 | HR 76 | Resp 12 | Ht 62.25 in | Wt 174.0 lb

## 2018-11-12 DIAGNOSIS — E119 Type 2 diabetes mellitus without complications: Secondary | ICD-10-CM

## 2018-11-12 MED ORDER — INSULIN NPH (HUMAN) (ISOPHANE) 100 UNIT/ML ~~LOC~~ SUSP: SUBCUTANEOUS | 11 refills | Status: DC

## 2018-11-12 NOTE — Progress Notes (Signed)
Subjective:    Patient ID: Mary Swanson, female    DOB: [birth year], 62 y.o.   MRN: [id number]  HPI   Here in follow up from ED visit on the 10th of December with low blood sugar of 27 and confusion. Her blood sugar had been dropping regularly into the 40s in the morning. She apparently increased her metformin ER to 3 times daily and NPH 3 times daily somewhere in the days following her appt with me.  She cannot say why this happened.  She brings in multiple old bottles with differing dosing.  Reportedly took insulin the day her sugar dropped so low, but then did not eat as well.  She never went to MAP at [facility] pharmacy to sign up for Lantus and Humalog to replace NPH and Regular insulin.  States she had developed chest discomfort in the past during a time she was having increasing doses of Lantus,  but forgot to mention to me and was afraid to start.  She wasn't sure if the Lantus was the cause.    She is currently taking half doses of her insulin:  Regular 3 units 3 times daily. NPH down from 12 units twice daily to 6 units twice daily  Metformin ER 500 mg twice daily. Sugars since then have ranbe  Her sugars were running in high 100s to low 200s until the 15th of December, when for some reason, she changed her NPH to 6 units 3 times daily, then into low 100s to high 100s. She has very dense and meticulous notes about blood sugars, medication dosing and BP/pulse throughout the day.  Somewhat confusing to read.  Current Meds  Medication Sig  . aspirin 81 MG chewable tablet Chew 81 mg by mouth. 1 every other day  . insulin lispro (HUMALOG KWIKPEN) 100 UNIT/ML KiwkPen 6 units injected subcutaneously before meals 3 times daily  . insulin NPH Human (HUMULIN N,NOVOLIN N) 100 UNIT/ML injection Inject 0.12 mLs (12 Units total) into the skin 2 (two) times daily before a meal. (Patient taking differently: Inject 6 Units into the skin 2 (two) times daily before a meal. )  . Insulin Pen Needle (BD  PEN NEEDLE NANO U/F) 32G X 4 MM MISC 1 Stick by Does not apply route every morning.  Marland Kitchen lisinopril (PRINIVIL,ZESTRIL) 5 MG tablet 1 tab by mouth once daily  . metFORMIN (GLUCOPHAGE-XR) 500 MG 24 hr tablet 1 tab by mouth twice daily with meals  . NOVOLIN R RELION 100 UNIT/ML injection INJECT 12 UNITS INTO THE SKIN 3 TIMES DAILY BEFORE MEALS. CHECK BLOOD SUGAR PRIOR TO EACH MEAL. (Patient taking differently: INJECT 3 UNITS INTO THE SKIN 3 TIMES DAILY BEFORE MEALS. CHECK BLOOD SUGAR PRIOR TO EACH MEAL.)   Allergies  Allergen Reactions  . Erythromycin Diarrhea  . Latex Hives      Review of Systems     Objective:   Physical Exam NAD Lungs:  CTA CV:  RRR without murmur or rub.  Radial pulses normal and equal.       Assessment & Plan:  1.  DM with patient confusion about medication. Discussed at length and diagrammed usage of insulin:  To use NPH back to 10 units only twice daily . Regular insulin 3 units the only insulin to use 3 times daily about 30 minutes before meal. Discussed obtaining Lantus and Humalog would allow her to dose her short acting insulin only 10 to 15 minutes before eating. Instructions for MAP given again.  Not clear where difficulties in following instructions lies.  Able to repeat plan back today.

## 2018-11-18 ENCOUNTER — Ambulatory Visit: Payer: Self-pay | Admitting: Internal Medicine

## 2018-11-22 ENCOUNTER — Ambulatory Visit: Payer: Self-pay | Admitting: Internal Medicine

## 2018-11-25 MED ORDER — INSULIN NPH (HUMAN) (ISOPHANE) 100 UNIT/ML ~~LOC~~ SUSP: SUBCUTANEOUS | 11 refills | Status: DC

## 2018-12-11 ENCOUNTER — Ambulatory Visit: Payer: Self-pay | Admitting: Internal Medicine

## 2018-12-12 ENCOUNTER — Ambulatory Visit: Payer: Self-pay | Admitting: Internal Medicine

## 2018-12-12 ENCOUNTER — Other Ambulatory Visit: Payer: Self-pay

## 2018-12-12 ENCOUNTER — Encounter: Payer: Self-pay | Admitting: Internal Medicine

## 2018-12-12 VITALS — BP 162/88 | HR 74 | Resp 12 | Ht 62.25 in | Wt 176.0 lb

## 2018-12-12 DIAGNOSIS — R059 Cough, unspecified: Secondary | ICD-10-CM

## 2018-12-12 DIAGNOSIS — F419 Anxiety disorder, unspecified: Secondary | ICD-10-CM

## 2018-12-12 DIAGNOSIS — E119 Type 2 diabetes mellitus without complications: Secondary | ICD-10-CM

## 2018-12-12 DIAGNOSIS — R05 Cough: Secondary | ICD-10-CM

## 2018-12-12 DIAGNOSIS — Z23 Encounter for immunization: Secondary | ICD-10-CM

## 2018-12-12 DIAGNOSIS — Z794 Long term (current) use of insulin: Secondary | ICD-10-CM

## 2018-12-12 DIAGNOSIS — I10 Essential (primary) hypertension: Secondary | ICD-10-CM

## 2018-12-12 MED ORDER — NOVOLIN R RELION 100 UNIT/ML IJ SOLN: INTRAMUSCULAR | 11 refills | Status: DC

## 2018-12-12 MED ORDER — INSULIN NPH (HUMAN) (ISOPHANE) 100 UNIT/ML ~~LOC~~ SUSP: SUBCUTANEOUS | 11 refills | Status: DC

## 2018-12-12 MED ORDER — LOSARTAN POTASSIUM 50 MG PO TABS: 50.0000 mg | ORAL_TABLET | Freq: Every day | ORAL | 11 refills | Status: DC
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
## 2018-12-12 NOTE — Patient Instructions (Addendum)
Ask about pneumococcal vaccine at [facility]--how much it costs, etc. And get it if affordable for you.  Bring in sugar log with each visit

## 2018-12-27 ENCOUNTER — Other Ambulatory Visit: Payer: Self-pay

## 2018-12-27 VITALS — BP 150/80 | HR 70

## 2018-12-27 DIAGNOSIS — I10 Essential (primary) hypertension: Secondary | ICD-10-CM

## 2018-12-27 MED ORDER — LOSARTAN POTASSIUM 100 MG PO TABS: ORAL_TABLET | ORAL | 11 refills | Status: DC

## 2018-12-27 NOTE — Progress Notes (Signed)
Discussed with Cherice--to increase Losartan to 100 mg daily and repeat BP check with BMP in 1 month. 100 mg Rx sent to [facility] Double up on the 50 mg tabs she has until runs out of this bottle

## 2018-12-28 LAB — BASIC METABOLIC PANEL
BUN / CREAT RATIO: 18 (ref 12–28)
BUN: 13 mg/dL (ref 8–27)
CO2: 25 mmol/L (ref 20–29)
Calcium: 9.7 mg/dL (ref 8.7–10.3)
Chloride: 99 mmol/L (ref 96–106)
Creatinine, Ser: 0.74 mg/dL (ref 0.57–1.00)
GFR calc Af Amer: 99 mL/min/{1.73_m2} (ref >59)
GFR calc non Af Amer: 86 mL/min/{1.73_m2} (ref >59)
GLUCOSE: 189 mg/dL — AB (ref 65–99)
Potassium: 4.5 mmol/L (ref 3.5–5.2)
Sodium: 140 mmol/L (ref 134–144)

## 2019-01-24 ENCOUNTER — Ambulatory Visit: Payer: Self-pay

## 2019-01-24 VITALS — BP 132/80 | HR 72

## 2019-01-24 DIAGNOSIS — I10 Essential (primary) hypertension: Secondary | ICD-10-CM

## 2019-01-24 NOTE — Progress Notes (Signed)
Patient BP started out high. Patient sat for 10 minutes and with recheck now in the normal range. Informed patient to continue taking her medication daily and do not miss doses. Patient verbalized understanding.

## 2019-03-14 ENCOUNTER — Other Ambulatory Visit: Payer: Self-pay

## 2019-03-14 ENCOUNTER — Telehealth (INDEPENDENT_AMBULATORY_CARE_PROVIDER_SITE_OTHER): Payer: Self-pay | Admitting: Internal Medicine

## 2019-03-14 DIAGNOSIS — E119 Type 2 diabetes mellitus without complications: Secondary | ICD-10-CM

## 2019-03-14 DIAGNOSIS — R058 Other specified cough: Secondary | ICD-10-CM

## 2019-03-14 DIAGNOSIS — F419 Anxiety disorder, unspecified: Secondary | ICD-10-CM

## 2019-03-14 DIAGNOSIS — I10 Essential (primary) hypertension: Secondary | ICD-10-CM

## 2019-03-14 DIAGNOSIS — T464X5A Adverse effect of angiotensin-converting-enzyme inhibitors, initial encounter: Secondary | ICD-10-CM

## 2019-03-14 DIAGNOSIS — R05 Cough: Secondary | ICD-10-CM

## 2019-03-14 DIAGNOSIS — R059 Cough, unspecified: Secondary | ICD-10-CM

## 2019-03-14 DIAGNOSIS — Z794 Long term (current) use of insulin: Secondary | ICD-10-CM

## 2019-03-14 HISTORY — DX: Other specified cough: R05.8

## 2019-03-14 HISTORY — DX: Adverse effect of angiotensin-converting-enzyme inhibitors, initial encounter: T46.4X5A

## 2019-03-14 NOTE — Patient Instructions (Addendum)
[facility]  Directions  Website Address: [address] [place], [place], [place] [address]  Phone: [phone]   To return in 2 weeks for fasting labs:  FLP, CMP, A1C, urine microalbumin/crea, Hep C and HIV

## 2019-03-14 NOTE — Progress Notes (Signed)
    Subjective:    Patient ID: Mary Swanson, female   DOB: [birth year], 63 y.o.   MRN: [id number]   HPI   1.  Hypertension:  Cough resolved after about 3 weeks following discontinuation of Lisinopril BP running 120/70 range or lower.  She is able to check at home.  2.  DM:  Checking sugars 3 times daily before meals. Morning:  139 has been the highest--generally around 120 Lunch:  175 has been highest.  But generally 120 or lower.   Dinner:  106 has been highest.  Ranges 83 to 106 These have been her sugars the past 2 months. She did not apply to MAP for the Lantus and Humalog. She still has not obtained her orange card as well.  She states, however, that she has been able to purchase her meds at [facility] pharmacy despite this, which does not make sense. Had last eye exam in August of 2019.  States no diabetic changes Has had cataract extraction.   3.  Stress/anxiety:  Attempted contact x 3 to start counseling/behavioral management/calming techniques with Daphene Calamity, MSW intern.  She never returned call.   She does not recall a voicemail. She still is interested in counseling.  Current Meds  Medication Sig  . aspirin 81 MG chewable tablet Chew 81 mg by mouth daily.   . insulin NPH Human (HUMULIN N,NOVOLIN N) 100 UNIT/ML injection Inject 10 units twice daily before breakfast and evening meals.  . Insulin Pen Needle (BD PEN NEEDLE NANO U/F) 32G X 4 MM MISC 1 Stick by Does not apply route every morning.  Marland Kitchen losartan (COZAAR) 100 MG tablet 1 tab by mouth daily.  . metFORMIN (GLUCOPHAGE-XR) 500 MG 24 hr tablet 1 tab by mouth twice daily with meals  . NOVOLIN R RELION 100 UNIT/ML injection INJECT 3 UNITS INTO THE SKIN 3 TIMES DAILY BEFORE MEALS. CHECK BLOOD SUGAR PRIOR TO EACH MEAL.   Allergies  Allergen Reactions  . Erythromycin Diarrhea  . Latex Hives  . Lisinopril Cough     Review of Systems    Objective:   LMP  (LMP Unknown)   Physical Exam   Assessment &  Plan   1.  Hypertension:  Sounds controlled on Losartan 100 mg. Need to confirm she is actually getting her oral meds with [facility] pharmacy Able to reach pharmacy subsequently and found out patient has not applied for MAP, no has she obtained orange card.   She was given her last samples of Losartan 100 mg on March 30.  2.  DM:  Sounds well controlled, however called [facility] and patient has not picked up insulin (humulin N and R) since 12/12/2018 and has done nothing to apply for either MAP or [facility] orange card. Called and left a message with patient to get on this and called our office manager to call her and see if we can get her moving to apply for the orange card. To come in for fasting labs in 2 weeks:  FLP, CMP, A1C, urine microalbumin/crea, Hep C, HIV screen. With me in 3 months  3.  ACE I cough:  Resolved with switch to Losartan.    4.  Stress/anxiety:  Gave her contact info for [employer] of the [place].

## 2019-04-01 ENCOUNTER — Other Ambulatory Visit: Payer: Self-pay

## 2019-06-13 ENCOUNTER — Ambulatory Visit: Payer: Self-pay | Admitting: Internal Medicine

## 2019-06-20 ENCOUNTER — Telehealth: Payer: Self-pay | Admitting: Internal Medicine

## 2019-06-20 MED ORDER — Medication
5.00 | Status: DC
Start: ? — End: 2019-06-20

## 2019-06-20 MED ORDER — METFORMIN HCL ER 500 MG PO TB24: ORAL_TABLET | ORAL | 11 refills | Status: DC

## 2019-06-20 MED ORDER — BENICAR 20 MG PO TABS
25.00 | ORAL_TABLET | ORAL | Status: DC
Start: ? — End: 2019-06-20

## 2019-06-20 MED ORDER — Medication
25.00 | Status: DC
Start: ? — End: 2019-06-20

## 2019-06-20 MED ORDER — EQUATE NICOTINE 4 MG MT GUM
4.00 | CHEWING_GUM | OROMUCOSAL | Status: DC
Start: ? — End: 2019-06-20

## 2019-06-20 MED ORDER — LOSARTAN POTASSIUM 100 MG PO TABS: ORAL_TABLET | ORAL | 11 refills | Status: DC

## 2019-06-20 MED ORDER — AMINO ACIDS PO BAR
0.50 | CHEWABLE_BAR | ORAL | Status: DC
Start: ? — End: 2019-06-20

## 2019-06-20 MED ORDER — NUTRINATE PO CHEW
0.50 | CHEWABLE_TABLET | ORAL | Status: DC
Start: ? — End: 2019-06-20

## 2019-06-20 MED ORDER — Medication
324.00 | Status: DC
Start: 2019-06-20 — End: 2019-06-20

## 2019-06-20 MED ORDER — Medication
100.00 | Status: DC
Start: 2019-06-20 — End: 2019-06-20

## 2019-06-20 MED ORDER — OLANZAPINE-FLUOXETINE HCL 6-50 MG PO CAPS
3.00 | ORAL_CAPSULE | ORAL | Status: DC
Start: ? — End: 2019-06-20

## 2019-06-20 MED ORDER — COMPOUND W FREEZE OFF EX AERO
0.00 | INHALATION_SPRAY | CUTANEOUS | Status: DC
Start: 2019-06-19 — End: 2019-06-20

## 2019-06-20 MED ORDER — NOVOLIN R RELION 100 UNIT/ML IJ SOLN: INTRAMUSCULAR | 0 refills | Status: DC

## 2019-06-20 MED ORDER — FE BISGLY-FE POLYSAC-C-THREON 150-50 MG PO CAPS
5.00 | ORAL_CAPSULE | ORAL | Status: DC
Start: 2019-06-19 — End: 2019-06-20

## 2019-06-20 MED ORDER — INSULIN NPH (HUMAN) (ISOPHANE) 100 UNIT/ML ~~LOC~~ SUSP: SUBCUTANEOUS | 0 refills | Status: DC

## 2019-06-20 MED ORDER — LURASIDONE HCL 40 MG PO TABS
200.00 | ORAL_TABLET | ORAL | Status: DC
Start: ? — End: 2019-06-20

## 2019-06-20 MED ORDER — ACETAMINOPHEN 325 MG PO TABS
650.00 | ORAL_TABLET | ORAL | Status: DC
Start: ? — End: 2019-06-20

## 2019-06-20 MED ORDER — GLUCOSAMINE-CHONDROIT-COLLAGEN PO
100.00 | ORAL | Status: DC
Start: 2019-06-19 — End: 2019-06-20

## 2019-06-20 NOTE — Telephone Encounter (Signed)
Long time neighbor, Lynnae Prude and patient called in. She was unable to obtain medication at [facility]. Called [facility] to find out she had brought unsigned paperwork with prescriptions on it to the pharmacy, so could not fill. Switched her Lispro to Relion Novolin R 3 units 3 times daily before meals Relion NPH 10 units in AM and 5 units in PM before meals. Losartan 100 mg daily Metformin XR 500 mg twice daily Box of syringes and box of needles.  Mr. Belenda Cruise is to call Monday to get instructions on how to get patient set up with MAP for insulin and orange card for Losartan at [facility].   Keep Met[facility] and [facility]. He is to make an appt for Ms. Klahr with me and promises to accompany her.

## 2019-06-20 NOTE — Telephone Encounter (Signed)
Spoke with patient. Was given all her medications at the hospital just needs to go to [facility] to fill them. Patient verbalized understanding

## 2019-06-20 NOTE — Telephone Encounter (Signed)
Patient called requesting to speak with Dr. Amil Amen regarding information were can get medication at low cost that was supposed to be taken today and is out of them. Patient states was approved for MAP and did not know had to go to [facility] to fill it every month her insulin.  Patient still on the line transfering  to Cherice to proceed with patient.

## 2019-06-24 ENCOUNTER — Ambulatory Visit (INDEPENDENT_AMBULATORY_CARE_PROVIDER_SITE_OTHER): Payer: Self-pay | Admitting: Internal Medicine

## 2019-06-24 ENCOUNTER — Other Ambulatory Visit: Payer: Self-pay

## 2019-06-24 ENCOUNTER — Encounter: Payer: Self-pay | Admitting: Internal Medicine

## 2019-06-24 VITALS — BP 160/78 | HR 80 | Resp 12 | Ht 62.25 in | Wt 182.0 lb

## 2019-06-24 DIAGNOSIS — I10 Essential (primary) hypertension: Secondary | ICD-10-CM

## 2019-06-24 DIAGNOSIS — E1165 Type 2 diabetes mellitus with hyperglycemia: Secondary | ICD-10-CM

## 2019-06-24 DIAGNOSIS — Z794 Long term (current) use of insulin: Secondary | ICD-10-CM

## 2019-06-24 DIAGNOSIS — E119 Type 2 diabetes mellitus without complications: Secondary | ICD-10-CM

## 2019-06-24 DIAGNOSIS — F423 Hoarding disorder: Secondary | ICD-10-CM

## 2019-06-24 DIAGNOSIS — R413 Other amnesia: Secondary | ICD-10-CM

## 2019-06-24 LAB — GLUCOSE, POCT (MANUAL RESULT ENTRY): POC Glucose: 173 mg/dl — AB (ref 70–99)

## 2019-06-24 NOTE — Telephone Encounter (Signed)
DONE

## 2019-06-24 NOTE — Patient Instructions (Signed)
Daily schedule:  Check sugar at 6:30 a.m. Give morning NPH and regular by 6:40 a.m. Eat at 7:10 a.m. Take Losartan and Metformin with meal  Check sugar at 12:00 noon Give regular insulin by 12:10 Eat at 12:40 p.m.  Check sugar at 6:00 p.m. Give NPH and regular insulin by 6:10 Eat at 6:40 pm. Take Metformin with meal  Write down your sugars as I wrote on back of papers.  Bring your meds and sugars in with each visit.  If you don't feel right--check your sugar

## 2019-06-24 NOTE — Progress Notes (Signed)
    Subjective:    Patient ID: Mary Swanson, female   DOB: 03/14/1956, 63 y.o.   MRN: [id number]   HPI   Here after hospitalization for high blood sugars and concern possibly for mental health issues.  Up at 6:15 a.m. Washes up Checks blood sugar between 6:30 and 7:00 a.m.  Gives herself her insulin in 10-15 minutes.  Does not sound like she eats at a set time after giving insulin. Takes her Losartan and Metformin with the morning insulin before eating breakfast. Discussed taking oral meds with breakfast.  Checks sugar at 2 to 2:30 p.m. Gives insulin much later than 15 minutes after checking sugar and not clear how much later she eats.  Female friend states she would check her sugar 2 hours before dinner and take her insulin after she eats.    She is having difficulty affording her insulin.  Discussed at length she needs to go apply to MAP as we have instructed multiple times so that her insulin is free.  Discussed with her female neighbor and friend present, who may be able to support her in this. Discussed at length also obtaining orange card so can obtain glucose monitoring equipment for much less.   Friend states her home is difficult to maneuver in --describes piles of things throughout home/hoarding behaviors.  Concern for memory as well per friend.  Current Meds  Medication Sig  . insulin NPH Human (NOVOLIN N) 100 UNIT/ML injection 10 units injected Subcutaneously in AM and 5 units injected in PM before meals  . Insulin Pen Needle (BD PEN NEEDLE NANO U/F) 32G X 4 MM MISC 1 Stick by Does not apply route every morning.  Marland Kitchen losartan (COZAAR) 100 MG tablet 1 tab by mouth daily.  . metFORMIN (GLUCOPHAGE-XR) 500 MG 24 hr tablet 1 tab by mouth twice daily with meals  . NOVOLIN R RELION 100 UNIT/ML injection INJECT 3 UNITS INTO THE SKIN 3 TIMES DAILY BEFORE MEALS. CHECK BLOOD SUGAR PRIOR TO EACH MEAL.   Allergies  Allergen Reactions  . Erythromycin Diarrhea  . Latex Hives  .  Lisinopril Cough     Review of Systems    Objective:   BP (!) 160/78 (BP Location: Left Arm, Cuff Size: Normal)   Pulse 80   Resp 12   Ht 5' 2.25" (1.581 m)   Wt 182 lb (82.6 kg)   LMP  (LMP Unknown)   BMI 33.02 kg/m   Physical Exam  NAD Lungs:  CTA CV: RRR  I-II/VI SEM murmur,no rub.  Radial and DP pulses normal and equal Abd:  S, NT, No HSM or mass, + BS LE:  No edema   Assessment & Plan  1.  DM:  Orders for both Lantus and Humalog pens and dosing to switch to when she applies to MAP. In meantime to continue NPH and regular.   Wrote out schedule for checking sugars, giving insulin and eating 3 times daily based on her current schedule. Have asked her to check her sugar if she does not feel well also. To document sugars   2.  Hypertension:  Not clear if missing meds--wrote these into her schedule.  3.  Likely hoarding behavior:  [name] not available to start work with her today.  Will refer for future evaluation and work.  4.  Memory concerns:  Will evaluate at next visit.

## 2019-06-25 MED ORDER — AGAMATRIX PRESTO W/DEVICE KIT: PACK | 0 refills | Status: DC

## 2019-06-25 MED ORDER — AGAMATRIX PRESTO TEST VI STRP: ORAL_STRIP | 11 refills | Status: DC

## 2019-06-25 MED ORDER — AGAMATRIX ULTRA-THIN LANCETS MISC: 11 refills | Status: DC

## 2019-06-25 MED ORDER — LANTUS SOLOSTAR 100 UNIT/ML ~~LOC~~ SOPN: PEN_INJECTOR | SUBCUTANEOUS | 11 refills | Status: DC

## 2019-06-25 MED ORDER — LOSARTAN POTASSIUM 100 MG PO TABS: ORAL_TABLET | ORAL | 11 refills | Status: DC

## 2019-06-25 MED ORDER — INSULIN LISPRO (1 UNIT DIAL) 100 UNIT/ML (KWIKPEN): PEN_INJECTOR | SUBCUTANEOUS | 11 refills | Status: DC

## 2019-07-07 ENCOUNTER — Telehealth: Payer: Self-pay | Admitting: Licensed Clinical Social Worker

## 2019-07-25 ENCOUNTER — Encounter: Payer: Self-pay | Admitting: Internal Medicine

## 2019-07-25 ENCOUNTER — Ambulatory Visit: Payer: Self-pay | Admitting: Internal Medicine

## 2019-07-27 ENCOUNTER — Encounter (HOSPITAL_COMMUNITY): Payer: Self-pay

## 2019-07-27 ENCOUNTER — Emergency Department (HOSPITAL_COMMUNITY)
Admission: EM | Admit: 2019-07-27 | Discharge: 2019-07-27 | Disposition: A | Discharge status: Home / Self Care | Payer: Self-pay | Attending: Emergency Medicine | Admitting: Emergency Medicine

## 2019-07-27 ENCOUNTER — Other Ambulatory Visit: Payer: Self-pay

## 2019-07-27 ENCOUNTER — Emergency Department (HOSPITAL_COMMUNITY): Payer: Self-pay

## 2019-07-27 DIAGNOSIS — Z91040 Latex allergy status: Secondary | ICD-10-CM | POA: Insufficient documentation

## 2019-07-27 DIAGNOSIS — Z79899 Other long term (current) drug therapy: Secondary | ICD-10-CM | POA: Insufficient documentation

## 2019-07-27 DIAGNOSIS — E119 Type 2 diabetes mellitus without complications: Secondary | ICD-10-CM | POA: Insufficient documentation

## 2019-07-27 DIAGNOSIS — Z7982 Long term (current) use of aspirin: Secondary | ICD-10-CM | POA: Insufficient documentation

## 2019-07-27 DIAGNOSIS — I10 Essential (primary) hypertension: Secondary | ICD-10-CM | POA: Insufficient documentation

## 2019-07-27 DIAGNOSIS — Z794 Long term (current) use of insulin: Secondary | ICD-10-CM | POA: Insufficient documentation

## 2019-07-27 DIAGNOSIS — R404 Transient alteration of awareness: Secondary | ICD-10-CM | POA: Insufficient documentation

## 2019-07-27 LAB — URINALYSIS, ROUTINE W REFLEX MICROSCOPIC
Bacteria, UA: NONE SEEN
Bilirubin Urine: NEGATIVE
Glucose, UA: >=500 mg/dL — AB
Hgb urine dipstick: NEGATIVE
Ketones, ur: NEGATIVE mg/dL
Leukocytes,Ua: NEGATIVE
Nitrite: NEGATIVE
Protein, ur: NEGATIVE mg/dL
Specific Gravity, Urine: 1.022 (ref 1.005–1.030)
pH: 6 (ref 5.0–8.0)

## 2019-07-27 LAB — COMPREHENSIVE METABOLIC PANEL
ALT: 17 U/L (ref 0–44)
AST: 18 U/L (ref 15–41)
Albumin: 3.6 g/dL (ref 3.5–5.0)
Alkaline Phosphatase: 80 U/L (ref 38–126)
Anion gap: 13 (ref 5–15)
BUN: 20 mg/dL (ref 8–23)
CO2: 24 mmol/L (ref 22–32)
Calcium: 9.6 mg/dL (ref 8.9–10.3)
Chloride: 104 mmol/L (ref 98–111)
Creatinine, Ser: 0.66 mg/dL (ref 0.44–1.00)
GFR calc Af Amer: >60 mL/min (ref >60)
GFR calc non Af Amer: >60 mL/min (ref >60)
Glucose, Bld: 295 mg/dL — ABNORMAL HIGH (ref 70–99)
Potassium: 3.9 mmol/L (ref 3.5–5.1)
Sodium: 141 mmol/L (ref 135–145)
Total Bilirubin: 0.6 mg/dL (ref 0.3–1.2)
Total Protein: 6.5 g/dL (ref 6.5–8.1)

## 2019-07-27 LAB — CBC WITH DIFFERENTIAL/PLATELET
Abs Immature Granulocytes: 0.01 K/uL (ref 0.00–0.07)
Basophils Absolute: 0 K/uL (ref 0.0–0.1)
Basophils Relative: 1 %
Eosinophils Absolute: 0.2 K/uL (ref 0.0–0.5)
Eosinophils Relative: 3 %
HCT: 37.6 % (ref 36.0–46.0)
Hemoglobin: 11.3 g/dL — ABNORMAL LOW (ref 12.0–15.0)
Immature Granulocytes: 0 %
Lymphocytes Relative: 17 %
Lymphs Abs: 0.9 K/uL (ref 0.7–4.0)
MCH: 28.2 pg (ref 26.0–34.0)
MCHC: 30.1 g/dL (ref 30.0–36.0)
MCV: 93.8 fL (ref 80.0–100.0)
Monocytes Absolute: 0.4 K/uL (ref 0.1–1.0)
Monocytes Relative: 7 %
Neutro Abs: 4.1 K/uL (ref 1.7–7.7)
Neutrophils Relative %: 72 %
Platelets: 269 K/uL (ref 150–400)
RBC: 4.01 MIL/uL (ref 3.87–5.11)
RDW: 18.2 % — ABNORMAL HIGH (ref 11.5–15.5)
WBC: 5.6 K/uL (ref 4.0–10.5)
nRBC: 0 % (ref 0.0–0.2)

## 2019-07-27 LAB — ETHANOL: Alcohol, Ethyl (B): <10 mg/dL (ref <10)

## 2019-07-27 LAB — AMMONIA: Ammonia: 11 umol/L (ref 9–35)

## 2019-07-27 LAB — RAPID URINE DRUG SCREEN, HOSP PERFORMED
Amphetamines: NOT DETECTED
Barbiturates: NOT DETECTED
Benzodiazepines: NOT DETECTED
Cocaine: NOT DETECTED
Opiates: NOT DETECTED
Tetrahydrocannabinol: NOT DETECTED

## 2019-07-27 IMAGING — CT CT HEAD WITHOUT CONTRAST
3 series · 16 of 47 positions shown, 19 images · non-contrast
Comparison: None.

CLINICAL DATA: Altered consciousness.

EXAM:
CT HEAD WITHOUT CONTRAST
TECHNIQUE: Contiguous axial images were obtained from the base of the skull
through the vertex without intravenous contrast.

[Series 2: head 5.0 h30s · axial · 0.41mm/px · z∈[-139,-4]mm · 10 of 33 slices shown, 13 images]
[im 3/33  brain]
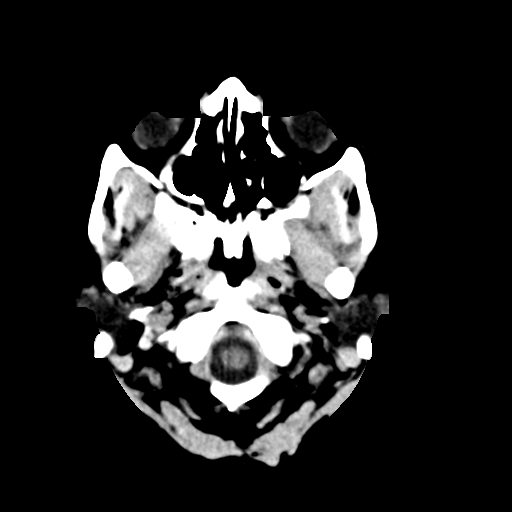
[im 3/33  bone]
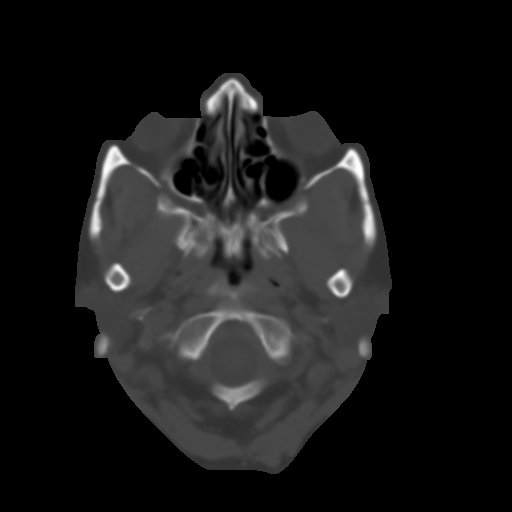
[im 6/33  brain]
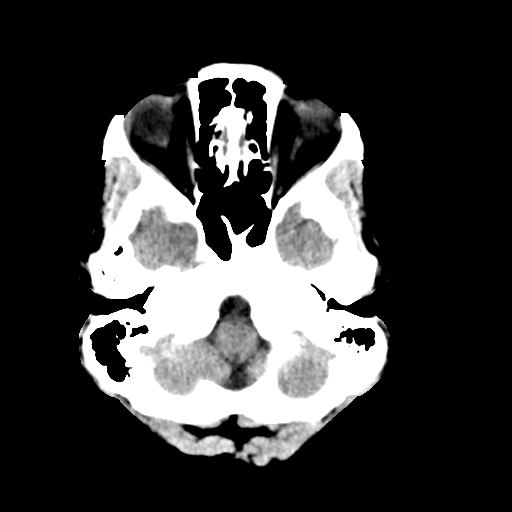
[im 9/33  brain]
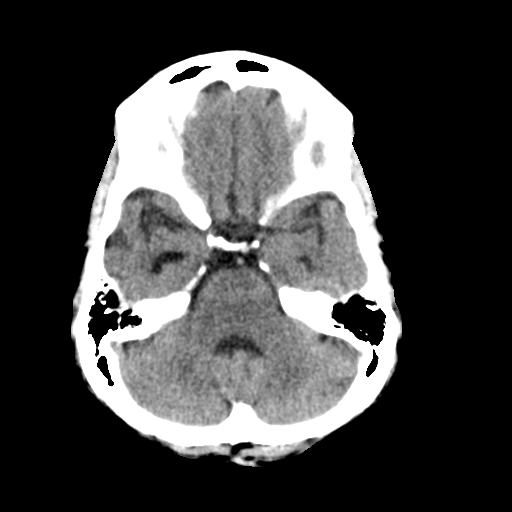
[im 12/33  brain]
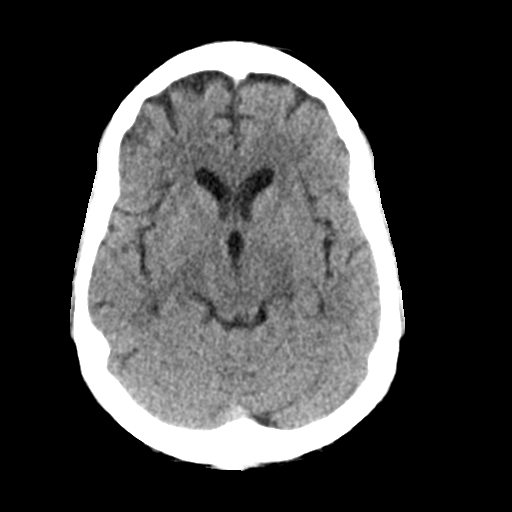
[im 15/33  brain]
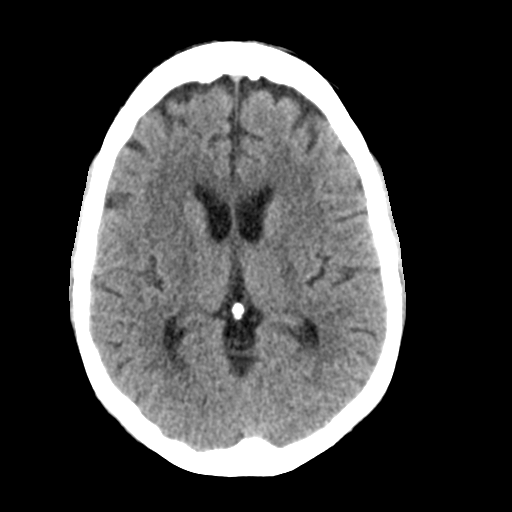
[im 15/33  bone]
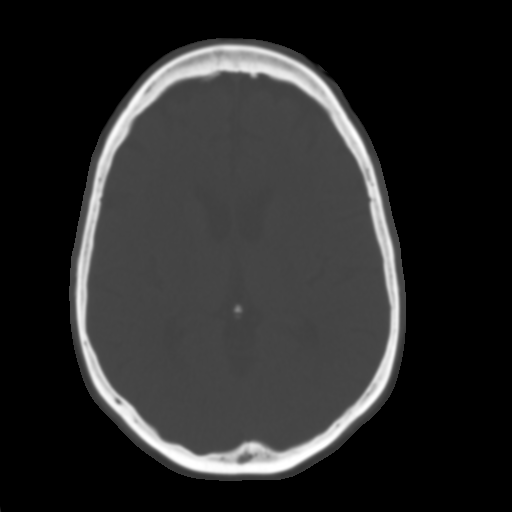
[im 18/33  brain]
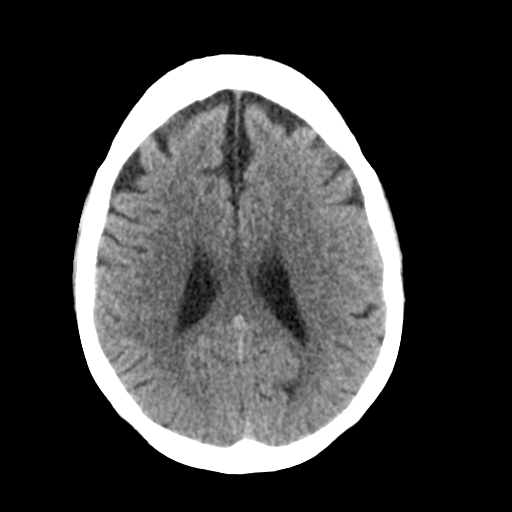
[im 21/33  brain]
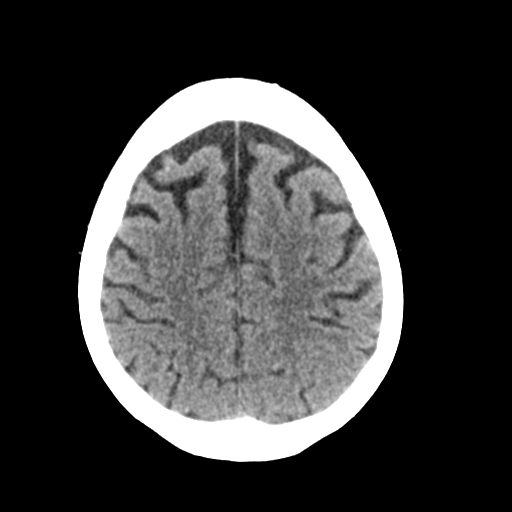
[im 25/33  brain]
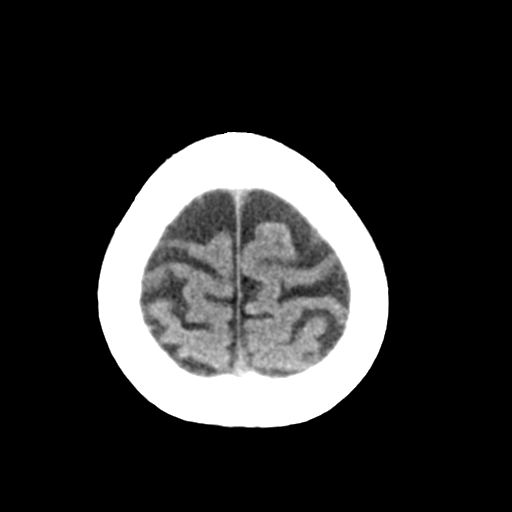
[im 27/33  brain]
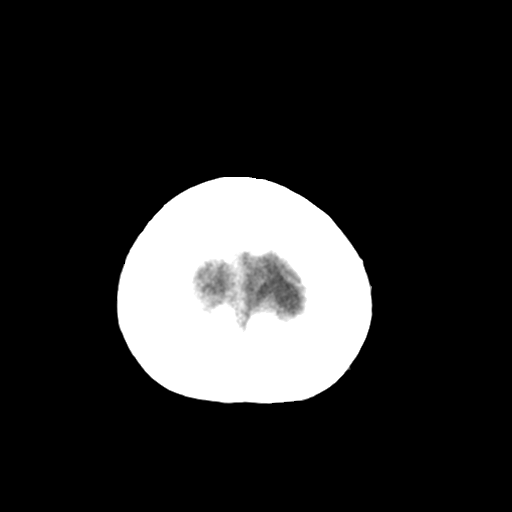
[im 27/33  bone]
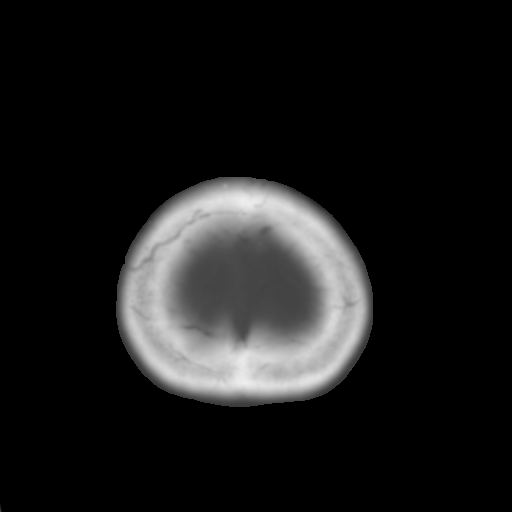
[im 30/33  brain]
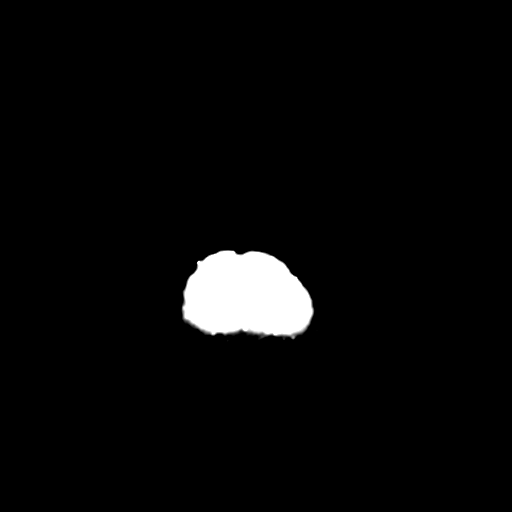

[Series 4: head 3.0 mpr cor · coronal · 0.32mm/px · 3 of 67 slices shown]
[im 23/67  brain]
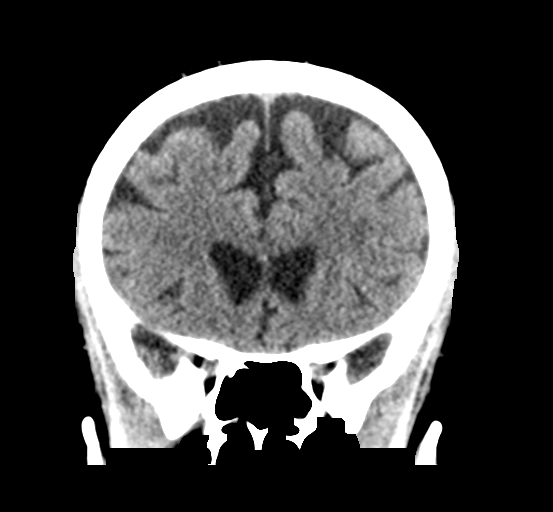
[im 30/67  brain]
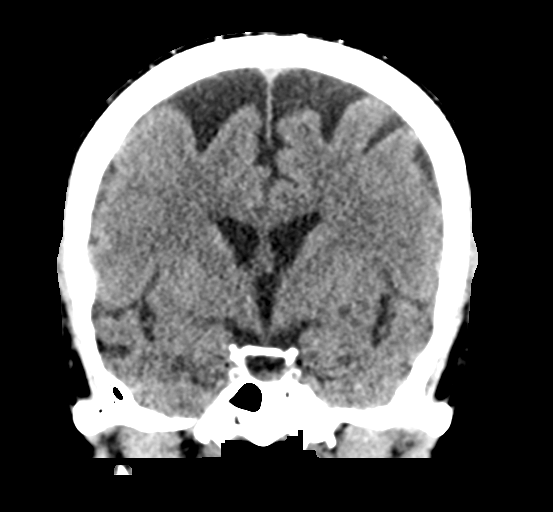
[im 37/67  brain]
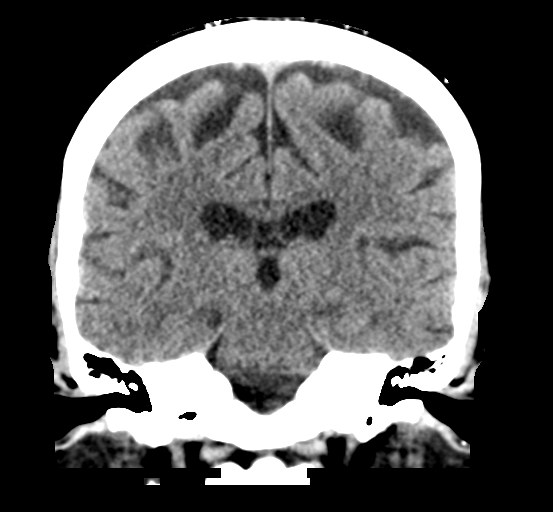

[Series 5: head 3.0 mpr sag · sagittal · 0.32mm/px · 3 of 59 slices shown]
[im 20/59  brain]
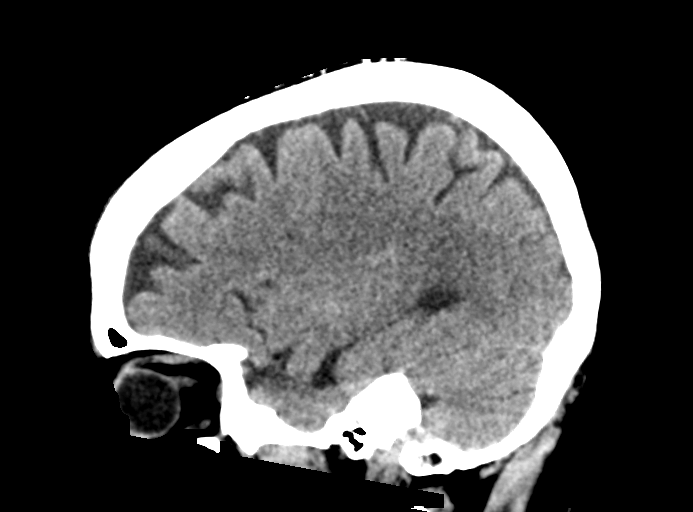
[im 30/59  brain]
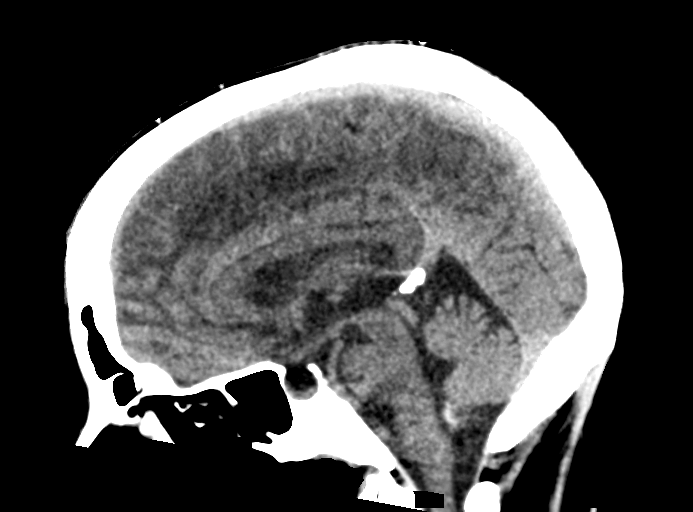
[im 39/59  brain]
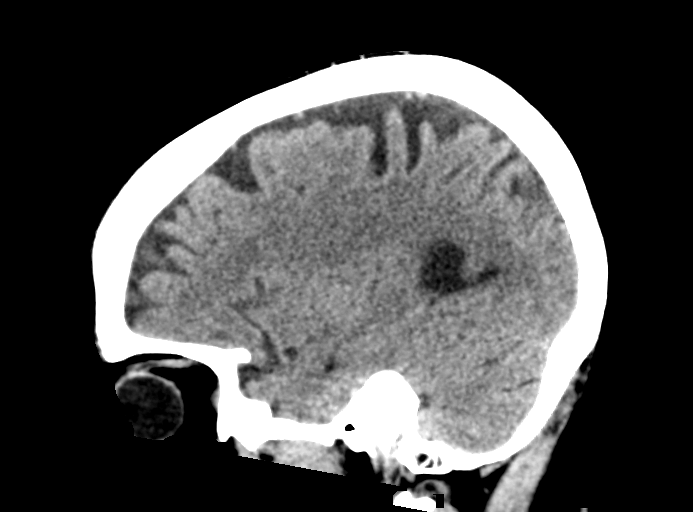

[16 of 47 positions shown; findings below may reference images not displayed]

FINDINGS: Brain: Mild cerebral atrophy for age. No mass lesion, hemorrhage,
hydrocephalus, acute infarct, intra-axial, or extra-axial fluid
collection.

Vascular: No hyperdense vessel or unexpected calcification.

Skull: No significant soft tissue swelling.  No skull fracture.

Sinuses/Orbits: Normal imaged portions of the orbits and globes.
Clear paranasal sinuses and mastoid air cells.

Other: None.
IMPRESSION: Mild cerebral atrophy.  No acute findings.
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
## 2019-07-27 NOTE — ED Triage Notes (Signed)
Case Mang order in place for contact/family  Contacts. Pt unable to remember any contacts ,names or phone numbers.
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
## 2019-07-27 NOTE — Discharge Instructions (Signed)
Refrain from driving until cleared by your PCP.
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
## 2019-07-27 NOTE — ED Notes (Signed)
Spoke to pt about continued need for urine sample, states she does not need to urinate at this time but will call for assistance when she thinks she can. Resting comfortably and denies pain

## 2019-07-27 NOTE — ED Notes (Signed)
Put hat in toilet for urine collection. Pt had bowel movement as well. Told pt to let us know when she has to pee again

## 2019-07-29 NOTE — Progress Notes (Addendum)
07/29/2019 1227 pm TOC CM reached out to [place] for charity referral for [facility]. States they will screen to see if pt will qualify. Jonnie Finner RN CCM, [facility] ED TOC CM [phone]  07/29/2019 2:04 pm. Spoke to [facility] rep, Sharin Grave and they accepted referral and have attempted to reach pt. [facility] unable to accept pt for [facility]. Contacted pt's emergency contact. She was with her friend, Lynnae Prude. Spoke to pt and she declined HH. States she will contact her PCP, Dr Amil Amen to schedule appt. Notified [place] that pt declined HH. [place], [place] ED TOC CM [phone]

## 2019-08-05 NOTE — Telephone Encounter (Signed)
Called patient to leave scheduling appointment.  Left voicemail message.

## 2019-08-05 NOTE — Telephone Encounter (Signed)
Spoke with patient about housing and voter's registration.  See notes in patient chart.

## 2019-09-02 ENCOUNTER — Ambulatory Visit: Payer: Self-pay | Admitting: Internal Medicine

## 2019-09-25 DIAGNOSIS — H43813 Vitreous degeneration, bilateral: Secondary | ICD-10-CM | POA: Diagnosis not present

## 2019-09-25 DIAGNOSIS — E119 Type 2 diabetes mellitus without complications: Secondary | ICD-10-CM | POA: Diagnosis not present

## 2019-09-25 DIAGNOSIS — H04123 Dry eye syndrome of bilateral lacrimal glands: Secondary | ICD-10-CM | POA: Diagnosis not present

## 2019-09-25 DIAGNOSIS — Z961 Presence of intraocular lens: Secondary | ICD-10-CM | POA: Diagnosis not present

## 2019-10-01 ENCOUNTER — Ambulatory Visit (INDEPENDENT_AMBULATORY_CARE_PROVIDER_SITE_OTHER): Payer: Medicare Other | Admitting: Family Medicine

## 2019-10-01 ENCOUNTER — Encounter: Payer: Self-pay | Admitting: Family Medicine

## 2019-10-01 ENCOUNTER — Other Ambulatory Visit: Payer: Self-pay

## 2019-10-01 ENCOUNTER — Telehealth: Payer: Self-pay | Admitting: Family Medicine

## 2019-10-01 VITALS — BP 138/58 | HR 86 | Ht 62.0 in | Wt 175.1 lb

## 2019-10-01 DIAGNOSIS — Z0000 Encounter for general adult medical examination without abnormal findings: Secondary | ICD-10-CM

## 2019-10-01 DIAGNOSIS — Z23 Encounter for immunization: Secondary | ICD-10-CM | POA: Diagnosis not present

## 2019-10-01 DIAGNOSIS — E119 Type 2 diabetes mellitus without complications: Secondary | ICD-10-CM

## 2019-10-01 DIAGNOSIS — D509 Iron deficiency anemia, unspecified: Secondary | ICD-10-CM

## 2019-10-01 DIAGNOSIS — Z8659 Personal history of other mental and behavioral disorders: Secondary | ICD-10-CM

## 2019-10-01 LAB — POCT GLYCOSYLATED HEMOGLOBIN (HGB A1C): HbA1c, POC (controlled diabetic range): 9.9 % — AB (ref 0.0–7.0)

## 2019-10-01 NOTE — Telephone Encounter (Signed)
Patient's caregiver did not get to go back with the patient today and as the patient has memory issues, he would like to know if they can get a referral for a memory issue type specialist.  Please call him at [facility], [phone] to discuss this asap, thanks.

## 2019-10-01 NOTE — Progress Notes (Signed)
Subjective:    Patient ID: Mary Swanson, female    DOB: February 21, 1956, 63 y.o.   MRN: [id number]   CC: Establish care  HPI:   Anxiety/Depression disorder Feels as if she may have issues with her memory as she is often forgetful. Has headaches sometimes diagonally across her brain spanning from front to back. She is adamant that these are not migraines. Feels as if 1 half of her brain playing basketball and the other half is playing football when talking to her friend when she gets excited and is talking too much. Basically other people's excitement gives her anxiety. Is open to medication for anxiety.  Last year depressed because a family member was ill. Non medicated and improved with exercise and self talk. PHQ 9 score is 3 today.   T2DM Last A1c was 8.6 today it is 9.9 she checks her blood sugar 1x daily. If in the morning it is high she will recheck again in the afternoon. Novolin was adjust by previous PCP from 10 to 6 units. Says she mostly gets a two digit reading that is around 85 or 90. She is not on a statin.  Anemia  Not taking iron for iron deficiency anemia. Has not had a colonoscopy in several years. Denies dizziness lightheadedness, shortness of breath, chest pain, fatigue.  No acute bleeding.  Smoking status reviewed  Review of Systems Per HPI, also denies recent illness, fever, headache, changes in vision, chest pain, shortness of breath, abdominal pain, N/V/D, weakness   Patient Active Problem List   Diagnosis Date Noted   ACE-inhibitor cough 03/14/2019   Diabetes mellitus without complication ([facility])    Heart murmur, systolic 69/67/8938   DIABETES MELLITUS II, UNCOMPLICATED 09/12/5101   HYPERLIPIDEMIA 01/24/2007   OBESITY, NOS 01/24/2007   ANEMIA, IRON DEFICIENCY, UNSPEC. 01/24/2007   DEPRESSION, MAJOR, RECURRENT 01/24/2007   RHINITIS, ALLERGIC 01/24/2007     Objective:  BP (!) 138/58    Pulse 86    Ht 5\' 2"  (1.575 m)    Wt 175 lb 2 oz (79.4 kg)    LMP   (LMP Unknown)    SpO2 97%    BMI 32.03 kg/m  Vitals and nursing note reviewed  General: Appears well, no acute distress. Age appropriate. Cardiac: RRR, normal heart sounds, no murmurs Respiratory: CTAB, normal effort Abdomen: soft, nontender, nondistended Extremities: No edema or cyanosis. Skin: Warm and dry, no rashes noted Neuro: alert and oriented, no focal deficits Psych: normal affect  Assessment & Plan:    Diabetes mellitus without complication ([place]) Not well controlled. She has had a significant jump in her A1c.  It seems as if she is not checking her blood sugar as she should or taking her insulin as directed.  She has had a history of hypoglycemia blood sugar as low as 26.  Currently with readings as low as 85.  I would like to do some education today reiterating how many times to check the blood sugar would be 3 times a day before meals and before bedtime.  And I would like to follow-up in 3 months with another A1c check and make adjustments as necessary then.  Anemia Last hemoglobin was in August and that was 11.3. She is currently asymptomatic. She denies any acute bleeding.  She has never had a colonoscopy. Colonoscopy referral today.  History of depression Patient stated last bout of depression was last year.  PHQ 9 today is 3. Will reassess at our next visit as there are some  concerns for memory which could either be depression or age related. Her increased anxiety could also be a contributing factor but does not appear to be affecting her life at this time. She has been asked to keep a mood diary and to bring to our next visit in January.  Healthcare maintenance -lipid  Panel (LDL 80) -rosuvastatin 20 mg sent to pharmacy -GI referral (anemia, colonoscopy) -flu vaccine -hep c antibody -mammogram referral -pcv 23 -diabetic foot exam     Lavonda Jumbo, DO [facility] PGY-1

## 2019-10-01 NOTE — Patient Instructions (Addendum)
It was very nice to meet you today. Please enjoy the rest of your week. Today you were seen to establish care. Please continue your insulin regimen and check your blood sugars 3 x a day with meals and before bed time. Keep a log of blood sugars and bring to your next visit. Keep a log of your mood and bring to next visit. Follow up in 3 months for pap smear and A1c check or sooner if needed.   Please call the clinic at [phone] if your symptoms worsen or you have any concerns. It was our pleasure to serve you.  [place] instructions:  Lantus: Inject 10 Units into the skin 3 (three) times daily. 30 units injected subcutaneously once daily in morning before breakfast  Humalog: 3 units injected subcutaneously 10 minutes before meals 3 times daily  Novolin: Inject 6-10 Units into the skin 2 (two) times daily before a meal. 10 units injected Subcutaneously in AM and 5 units injected in PM before meals   Diabetes Basics  Diabetes (diabetes mellitus) is a long-term (chronic) disease. It occurs when the body does not properly use sugar (glucose) that is released from food after you eat. Diabetes may be caused by one or both of these problems:  Your pancreas does not make enough of a hormone called insulin.  Your body does not react in a normal way to insulin that it makes. Insulin lets sugars (glucose) go into cells in your body. This gives you energy. If you have diabetes, sugars cannot get into cells. This causes high blood sugar (hyperglycemia). Follow these instructions at home: How is diabetes treated? ? You may need to take insulin or other diabetes medicines daily to keep your blood sugar in balance. Take your diabetes medicines every day as told by your doctor.  How do I manage my blood sugar?  Check your blood sugar levels using a blood glucose monitor as directed by your doctor. Your doctor will set treatment goals for you. Generally, you should have these blood sugar  levels:  Before meals (preprandial): 80-130 mg/dL (4.4-7.2 mmol/L).  After meals (postprandial): below 180 mg/dL (10 mmol/L).  A1c level: less than 7%. Write down the times that you will check your blood sugar levels.  What do I need to know about low blood sugar? Low blood sugar is called hypoglycemia. This is when blood sugar is at or below 70 mg/dL (3.9 mmol/L). Symptoms may include:  Feeling: ? Hungry. ? Worried or nervous (anxious). ? Sweaty and clammy. ? Confused. ? Dizzy. ? Sleepy. ? Sick to your stomach (nauseous).  Having: ? A fast heartbeat. ? A headache. ? A change in your vision. ? Tingling or no feeling (numbness) around the mouth, lips, or tongue. ? Jerky movements that you cannot control (seizure).  Having trouble with: ? Moving (coordination). ? Sleeping. ? Passing out (fainting). ? Getting upset easily (irritability). Treating low blood sugar To treat low blood sugar, eat or drink something sugary right away. If you can think clearly and swallow safely, follow the 15:15 rule:  Take 15 grams of a fast-acting carb (carbohydrate). Talk with your doctor about how much you should take.  Some fast-acting carbs are: ? Sugar tablets (glucose pills). Take 3-4 glucose pills. ? 6-8 pieces of hard candy. ? 4-6 oz (120-150 mL) of fruit juice. ? 4-6 oz (120-150 mL) of regular (not diet) soda. ? 1 Tbsp (15 mL) honey or sugar.  Check your blood sugar 15 minutes after you take the  carb.  If your blood sugar is still at or below 70 mg/dL (3.9 mmol/L), take 15 grams of a carb again.  If your blood sugar does not go above 70 mg/dL (3.9 mmol/L) after 3 tries, get help right away.  After your blood sugar goes back to normal, eat a meal or a snack within 1 hour. Treating very low blood sugar If your blood sugar is at or below 54 mg/dL (3 mmol/L), you have very low blood sugar (severe hypoglycemia). This is an emergency. Do not wait to see if the symptoms will go away.  Get medical help right away. Call your local emergency services (911 in the [place] Do not drive yourself to the hospital. Questions to ask your health care provider  Do I need to meet with a diabetes educator?  What equipment will I need to care for myself at home?  What diabetes medicines do I need? When should I take them?  How often do I need to check my blood sugar?  What number can I call if I have questions?  When is my next doctor's visit?  Where can I find a support group for people with diabetes? Where to find more information  American Diabetes Association: www.diabetes.org  American Association of Diabetes Educators: www.diabeteseducator.org/patient-resources Contact a doctor if:  Your blood sugar is at or above 240 mg/dL (29.9 mmol/L) for 2 days in a row.  You have been sick or have had a fever for 2 days or more, and you are not getting better.  You have any of these problems for more than 6 hours: ? You cannot eat or drink. ? You feel sick to your stomach (nauseous). ? You throw up (vomit). ? You have watery poop (diarrhea). Get help right away if:  Your blood sugar is lower than 54 mg/dL (3 mmol/L).  You get confused.  You have trouble: ? Thinking clearly. ? Breathing. Summary  Diabetes (diabetes mellitus) is a long-term (chronic) disease. It occurs when the body does not properly use sugar (glucose) that is released from food after digestion.  Take insulin and diabetes medicines as told.  Check your blood sugar every day, as often as told.  Keep all follow-up visits as told by your doctor. This is important. This information is not intended to replace advice given to you by your health care provider. Make sure you discuss any questions you have with your health care provider. Document Released: 02/15/2018 Document Revised: 01/03/2019 Document Reviewed: 02/15/2018 Elsevier Patient Education  2020 [employer].

## 2019-10-02 DIAGNOSIS — Z0000 Encounter for general adult medical examination without abnormal findings: Secondary | ICD-10-CM | POA: Insufficient documentation

## 2019-10-02 LAB — LIPID PANEL
Chol/HDL Ratio: 2.8 ratio (ref 0.0–4.4)
Cholesterol, Total: 160 mg/dL (ref 100–199)
HDL: 57 mg/dL (ref >39)
LDL Chol Calc (NIH): 80 mg/dL (ref 0–99)
Triglycerides: 130 mg/dL (ref 0–149)
VLDL Cholesterol Cal: 23 mg/dL (ref 5–40)

## 2019-10-02 LAB — HEPATITIS C ANTIBODY: Hep C Virus Ab: <0.1 s/co ratio (ref 0.0–0.9)

## 2019-10-02 MED ORDER — ROSUVASTATIN CALCIUM 20 MG PO TABS: 20.0000 mg | ORAL_TABLET | Freq: Every day | ORAL | 3 refills | Status: DC

## 2019-10-02 NOTE — Progress Notes (Signed)
I have reviewed all lab results which are normal or stable. Please inform the patient that although her LDL (the bad cholesterol) is within the normal range at 80 current guidelines is for diabetics have a goal of LDL less than 70. We will need to start a high intensity statin to achieve this goal (we discussed this at our visit yesterday). High intensity statins can lower LDL by up to 50-60%. I will send it to the pharmacy today, please pick up at her earliest convienence.

## 2019-10-03 NOTE — Assessment & Plan Note (Addendum)
Last hemoglobin was in August and that was 11.3. She is currently asymptomatic. She denies any acute bleeding.  She has never had a colonoscopy. Colonoscopy referral today.

## 2019-10-03 NOTE — Assessment & Plan Note (Signed)
Not well controlled. She has had a significant jump in her A1c.  It seems as if she is not checking her blood sugar as she should or taking her insulin as directed.  She has had a history of hypoglycemia blood sugar as low as 26.  Currently with readings as low as 85.  I would like to do some education today reiterating how many times to check the blood sugar would be 3 times a day before meals and before bedtime.  And I would like to follow-up in 3 months with another A1c check and make adjustments as necessary then.

## 2019-10-03 NOTE — Assessment & Plan Note (Addendum)
-  lipid  Panel (LDL 80) -rosuvastatin 20 mg sent to pharmacy -GI referral (anemia, colonoscopy) -flu vaccine -hep c antibody -mammogram referral -pcv 23 -diabetic foot exam

## 2019-10-03 NOTE — Assessment & Plan Note (Addendum)
Patient stated last bout of depression was last year.  PHQ 9 today is 3. Will reassess at our next visit as there are some concerns for memory which could either be depression or age related. Her increased anxiety could also be a contributing factor but does not appear to be affecting her life at this time. She has been asked to keep a mood diary and to bring to our next visit in January.

## 2019-10-09 NOTE — Telephone Encounter (Signed)
Yes there is a DPR in chart. Tammera Engert Kennon Holter, CMA

## 2019-10-09 NOTE — Telephone Encounter (Signed)
Mr. Arnette Norris was called discussed doing cognitive testing at her next visit in January to assess the need for further evaluation.  He voiced understanding and feels this is a good plan.

## 2019-10-16 ENCOUNTER — Ambulatory Visit: Payer: Self-pay | Admitting: Internal Medicine

## 2019-10-31 ENCOUNTER — Encounter: Payer: Self-pay | Admitting: Family Medicine

## 2019-12-03 ENCOUNTER — Other Ambulatory Visit: Payer: Self-pay

## 2019-12-03 ENCOUNTER — Ambulatory Visit (INDEPENDENT_AMBULATORY_CARE_PROVIDER_SITE_OTHER): Payer: Medicare Other | Admitting: Student in an Organized Health Care Education/Training Program

## 2019-12-03 VITALS — BP 150/80 | HR 97 | Wt 171.2 lb

## 2019-12-03 DIAGNOSIS — R739 Hyperglycemia, unspecified: Secondary | ICD-10-CM | POA: Diagnosis not present

## 2019-12-03 DIAGNOSIS — R030 Elevated blood-pressure reading, without diagnosis of hypertension: Secondary | ICD-10-CM | POA: Insufficient documentation

## 2019-12-03 DIAGNOSIS — R413 Other amnesia: Secondary | ICD-10-CM | POA: Diagnosis not present

## 2019-12-03 HISTORY — DX: Other amnesia: R41.3

## 2019-12-03 LAB — GLUCOSE, POCT (MANUAL RESULT ENTRY): POC Glucose: 345 mg/dl — AB (ref 70–99)
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
## 2019-12-03 NOTE — Assessment & Plan Note (Signed)
BP 150/80 this visit

## 2019-12-03 NOTE — Assessment & Plan Note (Addendum)
Glucose is 345 today.  Patient denied signs of hyperglycemia. I have some suspicion that patient is not taking her medications correctly.  According to her medication list, she appears to be on 30 units Lantus daily, Humalog 3 units at meals, NPH 10 units in the morning and 5 units at night and also 3 units at each meal +500 mg twice daily of Metformin. Last A1c was 9.5. Patient would probably benefit from home health or some type of assistance with medications. I would recommend asking patient to bring in all of her medications for evaluation and confirmation of what she is actually taking on a daily basis as this cannot be accurate.

## 2019-12-03 NOTE — Assessment & Plan Note (Addendum)
Was chart review, this does not appear to be an acute change.  Had extensive work-up including head CT and MRI which was positive only for age-appropriate atrophy. Referred patient to geriatric clinic for further cognitive and dementia work-up. In the meantime, also referred to CCM for social work assistance in assessing patient's living conditions as it seems that it may be unsafe or unhygienic for her to be living in her current conditions. -Obtaining labs that were not assessed at previous work-up for possible contributions to mental status changes including TSH, B12, folate, glucose.

## 2019-12-03 NOTE — Patient Instructions (Signed)
It was a pleasure to see you today!  To summarize our discussion for this visit:  Your physical exam looked normal. You may have some memory or cognitive decline which could be a sign of a disease such as dementia. I will refer you to our geriatric clinic to be evaluated for this disease.   Some additional health maintenance measures we should update are: Health Maintenance Due  Topic Date Due  . OPHTHALMOLOGY EXAM  09/11/1964  . HIV Screening  09/11/1969  . MAMMOGRAM  09/11/2004  . COLONOSCOPY  09/11/2004  . PAP SMEAR-Modifier  03/27/2009  . DEXA SCAN  09/12/2019  .   Please return to our clinic to see [place] for your memory assessment appointment.  Call the clinic at [phone] if your symptoms worsen or you have any concerns.   Thank you for allowing me to take part in your care,  Dr. Jamelle Rushing

## 2019-12-04 LAB — FOLATE: Folate: 16.2 ng/mL (ref >3.0)

## 2019-12-04 LAB — TSH: TSH: 1.47 u[IU]/mL (ref 0.450–4.500)

## 2019-12-04 LAB — VITAMIN B12: Vitamin B-12: 566 pg/mL (ref 232–1245)

## 2019-12-09 ENCOUNTER — Ambulatory Visit: Payer: Self-pay | Admitting: Licensed Clinical Social Worker
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
## 2019-12-16 ENCOUNTER — Ambulatory Visit: Payer: Self-pay | Admitting: Licensed Clinical Social Worker
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
## 2019-12-19 ENCOUNTER — Ambulatory Visit: Payer: Self-pay | Admitting: Licensed Clinical Social Worker

## 2019-12-19 NOTE — Chronic Care Management (AMB) (Signed)
   Social Work  Care Management Consultation  12/19/2019 Name: Mary Swanson MRN: [id number] DOB: Dec 26, 1955  Mary Swanson is a 64 y.o. year old female who sees Autry-Lott, Randa Evens, DO for primary care. LCSW was contact by Richardson Landry APS worker [phone] to follow up on report filed.  APS worker has completed a home visit with patient,  case remains open at this time with disposition pending. LCSW has not been able to contact patient via phone. Patient was not seen or contacted during this encounter however LCSW reviewed chart, notes, and collaboration with appropriate team members.     Recommendation: After consultation with APS worker and CMA with Geri team, a letter will be mailed to patient's home asking her to call the office to schedule with [facility].    Plan:  1. The care management team is available to follow up with the patient during office visit or during [place] clinic.  2. APS disposition is pending, currently active with Koleen Nimrod.  3. LCSW will wait until patient contacts office or disposition from APS 4. Nothing further can be done by LCSW at this time    Sammuel Hines, LCSW Clinical Social Worker [facility] / [facility]   [phone] 2:32 PM

## 2019-12-25 ENCOUNTER — Encounter: Payer: Self-pay | Admitting: *Deleted

## 2019-12-25 ENCOUNTER — Telehealth: Payer: Self-pay | Admitting: Family Medicine

## 2019-12-25 ENCOUNTER — Ambulatory Visit: Payer: Self-pay | Admitting: Licensed Clinical Social Worker

## 2019-12-25 NOTE — Telephone Encounter (Signed)
Ms. Mary Swanson is calling and has come questions and concerns about [name]. Please call her as soon as you can. She has left you a voicemail. jw

## 2019-12-25 NOTE — Chronic Care Management (AMB) (Signed)
   Clinical Social Work  Care Management referral   12/25/2019 Name: Mary Swanson MRN: [id number] DOB: 06/30/1956  Mary Swanson is a 64 y.o. year old female who is a primary care patient of Autry-Lott, Randa Evens, DO .   LCSW received voice message from patient's friend [name] [phone] requesting a return call as it was very important.  LCSW returned call.  [name] inquiring about home visit made by Child psychotherapist.  Reports continued concerns with patient's behavior and lack of cleanliness in her home and personal hygiene  LCSW explained to Ms. Cobb this LCSW does not do home visits.  She confused this LCSW with DSS APS Child psychotherapist.  LCSW explained she will need to contact DSS social work to share concerns or new information.  Phone number to DSS Adult Protective services provided.   Intervention: Patient was not interviewed or contacted during this encounter however LCSW reviewed chart, notes, insurance and collaborated with CMA reference calling neighbor to remind patient about upcoming appointment with PCP Feb. 5th.  Patient has been referred to [facility] clinic for further evaluation.   Plan:   1. CMA [facility] ) will meet with patient after visit with PCP to provide [facility] clinic packet and answer any questions. 2. If patient does not come to appointment CMA will mail patient a letter. 3. LCSW will meet with patient during Geri Clinic to assess needs  Sammuel Hines, LCSW Clinical Social Worker [facility] / [facility]   [phone] 2:24 PM

## 2019-12-27 ENCOUNTER — Emergency Department (HOSPITAL_COMMUNITY): Payer: Medicare Other

## 2019-12-27 ENCOUNTER — Encounter (HOSPITAL_COMMUNITY): Payer: Self-pay | Admitting: Internal Medicine

## 2019-12-27 ENCOUNTER — Inpatient Hospital Stay (HOSPITAL_COMMUNITY)
Admission: EM | Admit: 2019-12-27 | Discharge: 2020-01-01 | DRG: 637 | Disposition: A | Discharge status: Skilled Nursing Facility | Payer: Medicare Other | Attending: Internal Medicine | Admitting: Internal Medicine

## 2019-12-27 ENCOUNTER — Other Ambulatory Visit: Payer: Self-pay

## 2019-12-27 DIAGNOSIS — E669 Obesity, unspecified: Secondary | ICD-10-CM | POA: Diagnosis present

## 2019-12-27 DIAGNOSIS — E870 Hyperosmolality and hypernatremia: Secondary | ICD-10-CM | POA: Diagnosis present

## 2019-12-27 DIAGNOSIS — Z6832 Body mass index (BMI) 32.0-32.9, adult: Secondary | ICD-10-CM | POA: Diagnosis not present

## 2019-12-27 DIAGNOSIS — F05 Delirium due to known physiological condition: Secondary | ICD-10-CM | POA: Diagnosis present

## 2019-12-27 DIAGNOSIS — E161 Other hypoglycemia: Secondary | ICD-10-CM | POA: Diagnosis not present

## 2019-12-27 DIAGNOSIS — R404 Transient alteration of awareness: Secondary | ICD-10-CM | POA: Diagnosis not present

## 2019-12-27 DIAGNOSIS — N179 Acute kidney failure, unspecified: Secondary | ICD-10-CM

## 2019-12-27 DIAGNOSIS — R410 Disorientation, unspecified: Secondary | ICD-10-CM | POA: Diagnosis not present

## 2019-12-27 DIAGNOSIS — Z794 Long term (current) use of insulin: Secondary | ICD-10-CM | POA: Diagnosis not present

## 2019-12-27 DIAGNOSIS — Z9119 Patient's noncompliance with other medical treatment and regimen: Secondary | ICD-10-CM

## 2019-12-27 DIAGNOSIS — M6282 Rhabdomyolysis: Secondary | ICD-10-CM | POA: Diagnosis present

## 2019-12-27 DIAGNOSIS — E1165 Type 2 diabetes mellitus with hyperglycemia: Secondary | ICD-10-CM | POA: Diagnosis not present

## 2019-12-27 DIAGNOSIS — G9341 Metabolic encephalopathy: Secondary | ICD-10-CM | POA: Diagnosis not present

## 2019-12-27 DIAGNOSIS — Z20822 Contact with and (suspected) exposure to covid-19: Secondary | ICD-10-CM | POA: Diagnosis not present

## 2019-12-27 DIAGNOSIS — E1100 Type 2 diabetes mellitus with hyperosmolarity without nonketotic hyperglycemic-hyperosmolar coma (NKHHC): Principal | ICD-10-CM | POA: Diagnosis present

## 2019-12-27 DIAGNOSIS — R279 Unspecified lack of coordination: Secondary | ICD-10-CM | POA: Diagnosis not present

## 2019-12-27 DIAGNOSIS — E11649 Type 2 diabetes mellitus with hypoglycemia without coma: Secondary | ICD-10-CM | POA: Diagnosis not present

## 2019-12-27 DIAGNOSIS — R739 Hyperglycemia, unspecified: Secondary | ICD-10-CM

## 2019-12-27 DIAGNOSIS — Z743 Need for continuous supervision: Secondary | ICD-10-CM | POA: Diagnosis not present

## 2019-12-27 DIAGNOSIS — R9389 Abnormal findings on diagnostic imaging of other specified body structures: Secondary | ICD-10-CM | POA: Diagnosis not present

## 2019-12-27 DIAGNOSIS — E785 Hyperlipidemia, unspecified: Secondary | ICD-10-CM | POA: Diagnosis present

## 2019-12-27 DIAGNOSIS — E860 Dehydration: Secondary | ICD-10-CM | POA: Diagnosis present

## 2019-12-27 DIAGNOSIS — R000 Tachycardia, unspecified: Secondary | ICD-10-CM | POA: Diagnosis not present

## 2019-12-27 DIAGNOSIS — I10 Essential (primary) hypertension: Secondary | ICD-10-CM

## 2019-12-27 DIAGNOSIS — Z03818 Encounter for observation for suspected exposure to other biological agents ruled out: Secondary | ICD-10-CM | POA: Diagnosis not present

## 2019-12-27 DIAGNOSIS — G9340 Encephalopathy, unspecified: Secondary | ICD-10-CM | POA: Diagnosis not present

## 2019-12-27 DIAGNOSIS — M6281 Muscle weakness (generalized): Secondary | ICD-10-CM | POA: Diagnosis not present

## 2019-12-27 DIAGNOSIS — F0390 Unspecified dementia without behavioral disturbance: Secondary | ICD-10-CM | POA: Diagnosis present

## 2019-12-27 DIAGNOSIS — E119 Type 2 diabetes mellitus without complications: Secondary | ICD-10-CM

## 2019-12-27 DIAGNOSIS — R2681 Unsteadiness on feet: Secondary | ICD-10-CM | POA: Diagnosis not present

## 2019-12-27 DIAGNOSIS — E162 Hypoglycemia, unspecified: Secondary | ICD-10-CM | POA: Diagnosis not present

## 2019-12-27 HISTORY — DX: Dehydration: E86.0

## 2019-12-27 HISTORY — DX: Acute kidney failure, unspecified: N17.9

## 2019-12-27 LAB — URINALYSIS, ROUTINE W REFLEX MICROSCOPIC
Bilirubin Urine: NEGATIVE
Glucose, UA: >=500 mg/dL — AB
Ketones, ur: NEGATIVE mg/dL
Leukocytes,Ua: NEGATIVE
Nitrite: NEGATIVE
Protein, ur: NEGATIVE mg/dL
Specific Gravity, Urine: 1.031 — ABNORMAL HIGH (ref 1.005–1.030)
pH: 5 (ref 5.0–8.0)

## 2019-12-27 LAB — MAGNESIUM: Magnesium: 2.6 mg/dL — ABNORMAL HIGH (ref 1.7–2.4)

## 2019-12-27 LAB — RAPID URINE DRUG SCREEN, HOSP PERFORMED
Amphetamines: NOT DETECTED
Barbiturates: NOT DETECTED
Benzodiazepines: NOT DETECTED
Cocaine: NOT DETECTED
Opiates: NOT DETECTED
Tetrahydrocannabinol: NOT DETECTED

## 2019-12-27 LAB — COMPREHENSIVE METABOLIC PANEL
ALT: 28 U/L (ref 0–44)
AST: 52 U/L — ABNORMAL HIGH (ref 15–41)
Albumin: 4.3 g/dL (ref 3.5–5.0)
Alkaline Phosphatase: 105 U/L (ref 38–126)
Anion gap: 13 (ref 5–15)
BUN: 19 mg/dL (ref 8–23)
CO2: 25 mmol/L (ref 22–32)
Calcium: 10.3 mg/dL (ref 8.9–10.3)
Chloride: 102 mmol/L (ref 98–111)
Creatinine, Ser: 1.09 mg/dL — ABNORMAL HIGH (ref 0.44–1.00)
GFR calc Af Amer: >60 mL/min (ref >60)
GFR calc non Af Amer: 53 mL/min — ABNORMAL LOW (ref >60)
Glucose, Bld: 911 mg/dL (ref 70–99)
Potassium: 5.2 mmol/L — ABNORMAL HIGH (ref 3.5–5.1)
Sodium: 140 mmol/L (ref 135–145)
Total Bilirubin: 0.9 mg/dL (ref 0.3–1.2)
Total Protein: 8.1 g/dL (ref 6.5–8.1)

## 2019-12-27 LAB — RESPIRATORY PANEL BY RT PCR (FLU A&B, COVID)
Influenza A by PCR: NEGATIVE
Influenza B by PCR: NEGATIVE
SARS Coronavirus 2 by RT PCR: NEGATIVE

## 2019-12-27 LAB — BLOOD GAS, VENOUS
Acid-Base Excess: 2.7 mmol/L — ABNORMAL HIGH (ref 0.0–2.0)
Bicarbonate: 29.4 mmol/L — ABNORMAL HIGH (ref 20.0–28.0)
O2 Saturation: 46.7 %
Patient temperature: 98.6
pCO2, Ven: 56.9 mmHg (ref 44.0–60.0)
pH, Ven: 7.33 (ref 7.250–7.430)
pO2, Ven: <31 mmHg — CL (ref 32.0–45.0)

## 2019-12-27 LAB — CBC WITH DIFFERENTIAL/PLATELET
Abs Immature Granulocytes: 0.04 10*3/uL (ref 0.00–0.07)
Basophils Absolute: 0 10*3/uL (ref 0.0–0.1)
Basophils Relative: 0 %
Eosinophils Absolute: 0 10*3/uL (ref 0.0–0.5)
Eosinophils Relative: 0 %
HCT: 42.6 % (ref 36.0–46.0)
Hemoglobin: 13.1 g/dL (ref 12.0–15.0)
Immature Granulocytes: 0 %
Lymphocytes Relative: 6 %
Lymphs Abs: 0.6 10*3/uL — ABNORMAL LOW (ref 0.7–4.0)
MCH: 29.1 pg (ref 26.0–34.0)
MCHC: 30.8 g/dL (ref 30.0–36.0)
MCV: 94.7 fL (ref 80.0–100.0)
Monocytes Absolute: 0.4 10*3/uL (ref 0.1–1.0)
Monocytes Relative: 5 %
Neutro Abs: 8.5 10*3/uL — ABNORMAL HIGH (ref 1.7–7.7)
Neutrophils Relative %: 89 %
Platelets: 321 10*3/uL (ref 150–400)
RBC: 4.5 MIL/uL (ref 3.87–5.11)
RDW: 13.2 % (ref 11.5–15.5)
WBC: 9.5 10*3/uL (ref 4.0–10.5)
nRBC: 0 % (ref 0.0–0.2)

## 2019-12-27 LAB — CBG MONITORING, ED
Glucose-Capillary: >600 mg/dL (ref 70–99)
Glucose-Capillary: >600 mg/dL (ref 70–99)
Glucose-Capillary: 524 mg/dL (ref 70–99)
Glucose-Capillary: 287 mg/dL — ABNORMAL HIGH (ref 70–99)

## 2019-12-27 LAB — BASIC METABOLIC PANEL
Anion gap: 9 (ref 5–15)
BUN: 16 mg/dL (ref 8–23)
CO2: 29 mmol/L (ref 22–32)
Calcium: 10.1 mg/dL (ref 8.9–10.3)
Chloride: 116 mmol/L — ABNORMAL HIGH (ref 98–111)
Creatinine, Ser: 0.68 mg/dL (ref 0.44–1.00)
GFR calc Af Amer: >60 mL/min (ref >60)
GFR calc non Af Amer: >60 mL/min (ref >60)
Glucose, Bld: 140 mg/dL — ABNORMAL HIGH (ref 70–99)
Potassium: 3.5 mmol/L (ref 3.5–5.1)
Sodium: 154 mmol/L — ABNORMAL HIGH (ref 135–145)

## 2019-12-27 LAB — PHOSPHORUS: Phosphorus: 5.1 mg/dL — ABNORMAL HIGH (ref 2.5–4.6)

## 2019-12-27 LAB — TROPONIN I (HIGH SENSITIVITY): Troponin I (High Sensitivity): 3 ng/L (ref <18)

## 2019-12-27 LAB — ETHANOL: Alcohol, Ethyl (B): <10 mg/dL (ref <10)

## 2019-12-27 LAB — GLUCOSE, CAPILLARY
Glucose-Capillary: 271 mg/dL — ABNORMAL HIGH (ref 70–99)
Glucose-Capillary: 120 mg/dL — ABNORMAL HIGH (ref 70–99)

## 2019-12-27 LAB — CK: Total CK: 1948 U/L — ABNORMAL HIGH (ref 38–234)

## 2019-12-27 LAB — LIPASE, BLOOD: Lipase: 19 U/L (ref 11–51)

## 2019-12-27 LAB — BETA-HYDROXYBUTYRIC ACID: Beta-Hydroxybutyric Acid: 0.26 mmol/L (ref 0.05–0.27)

## 2019-12-27 IMAGING — DX DG CHEST 1V PORT
1 series · 1 of 1 positions shown · non-contrast
Comparison: None.

CLINICAL DATA: Altered mental status

EXAM:
PORTABLE CHEST 1 VIEW

[chest ap]
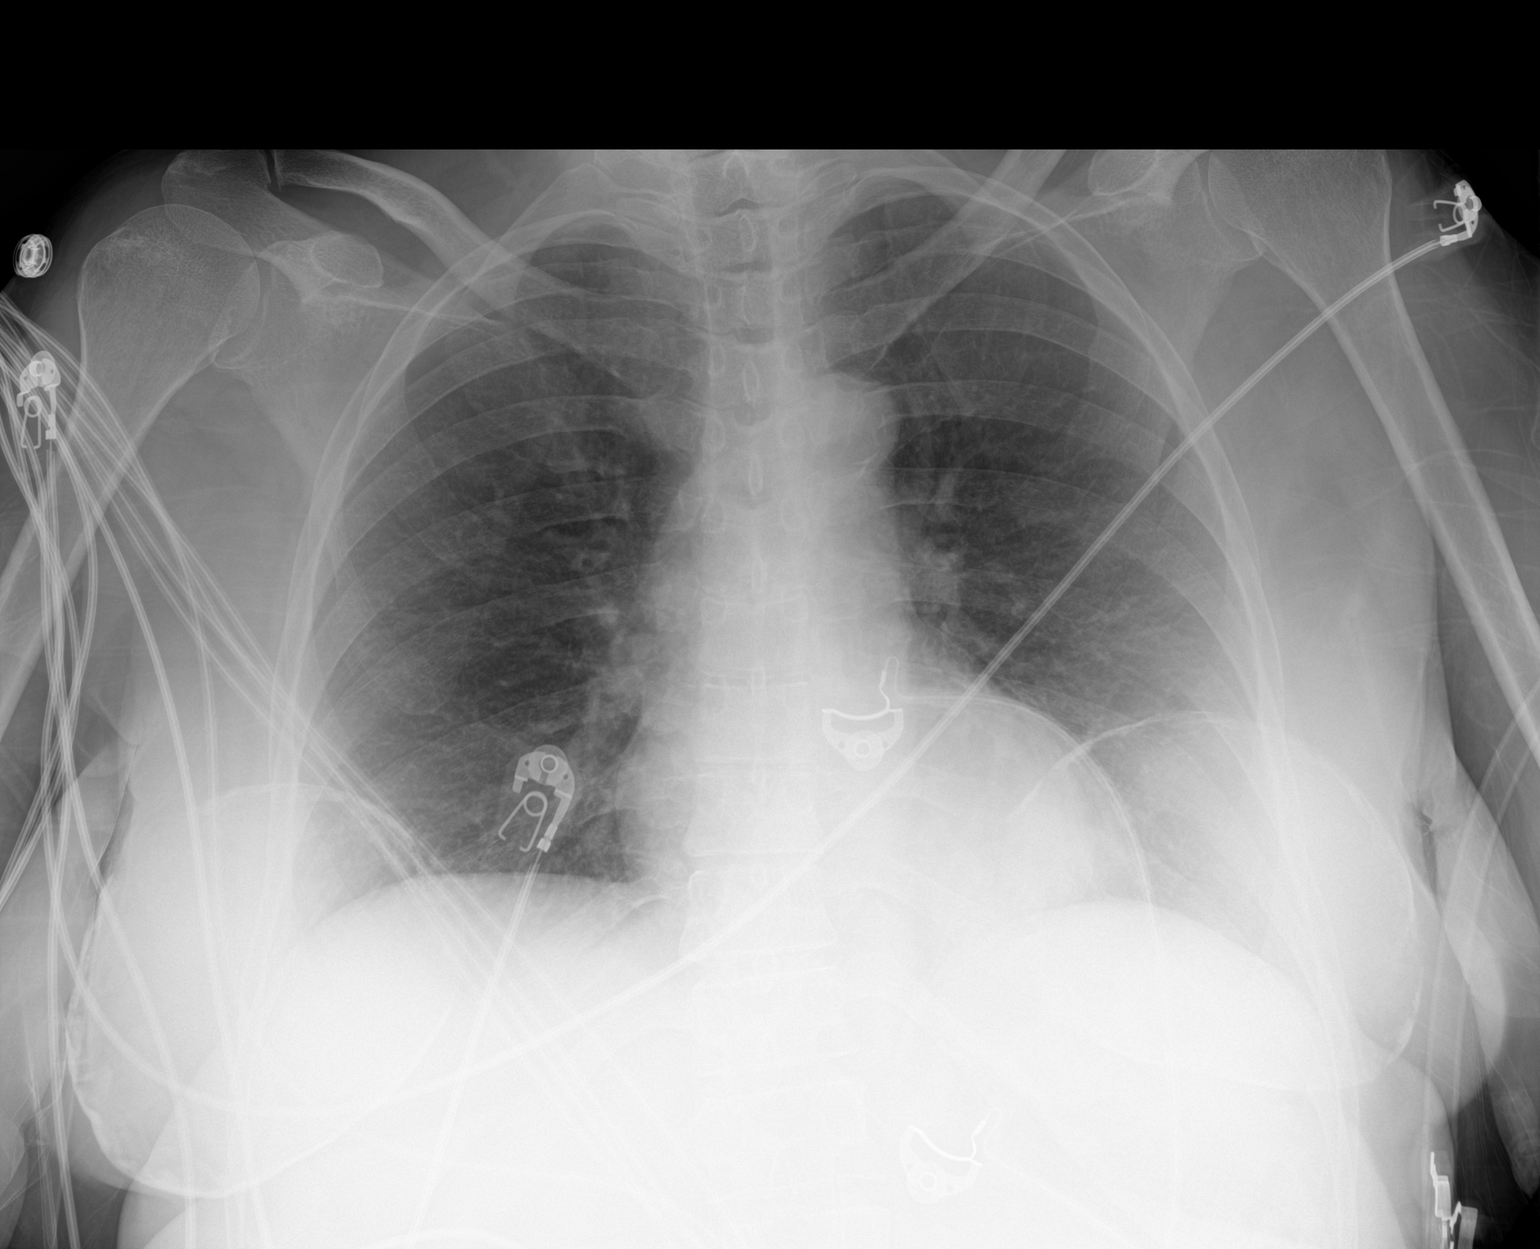

[1 of 1 positions shown; findings below may reference images not displayed]

FINDINGS: The heart size is normal. There is no pneumothorax or large pleural
effusion. There is a possible retrocardiac opacity that is somewhat
suboptimally evaluated secondary to an overlapping breast implant.
Aortic calcifications are noted. There are peripherally calcified
breast implants.
IMPRESSION: Questionable retrocardiac opacity which may represent atelectasis or
infiltrate. An overlapping breast implant limits evaluation of this
finding.

## 2019-12-27 IMAGING — CT CT HEAD W/O CM
3 series · 15 of 47 positions shown, 18 images · non-contrast
Comparison: [DATE]

CLINICAL DATA: Altered mental status

EXAM:
CT HEAD WITHOUT CONTRAST
TECHNIQUE: Contiguous axial images were obtained from the base of the skull
through the vertex without intravenous contrast.

[Series 2: head wo · axial · 0.44mm/px · z∈[-160,-35]mm · 9 of 31 slices shown, 12 images]
[im 3/31  brain]
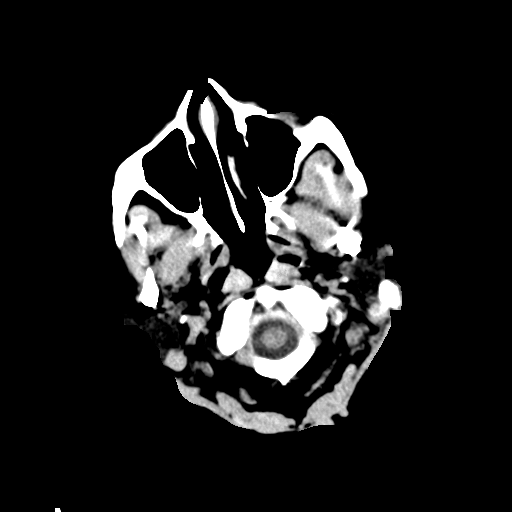
[im 3/31  bone]
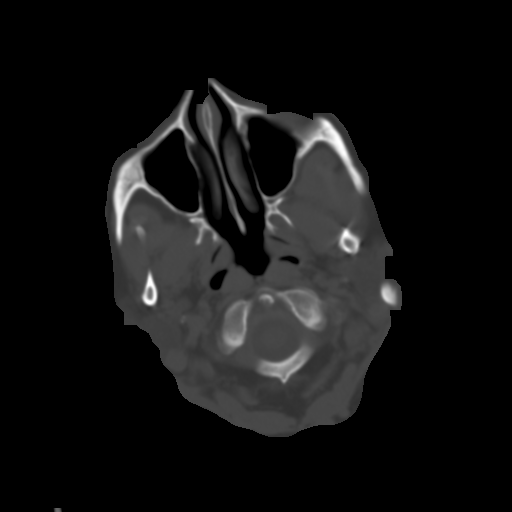
[im 6/31  brain]
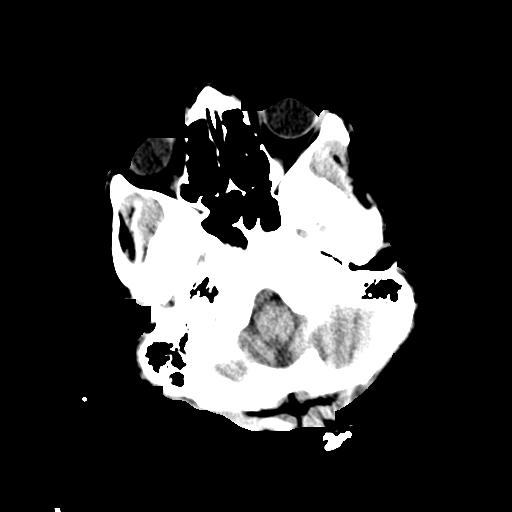
[im 9/31  brain]
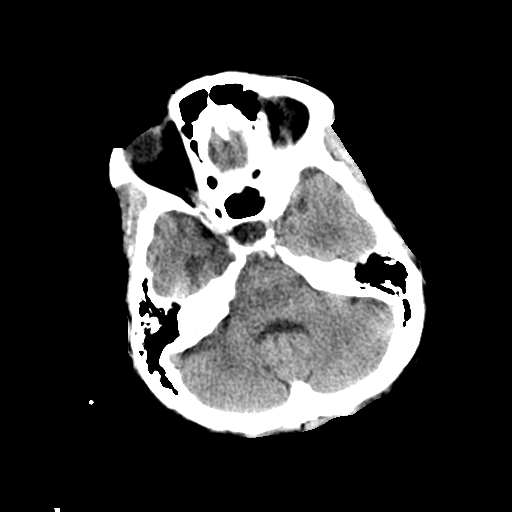
[im 12/31  brain]
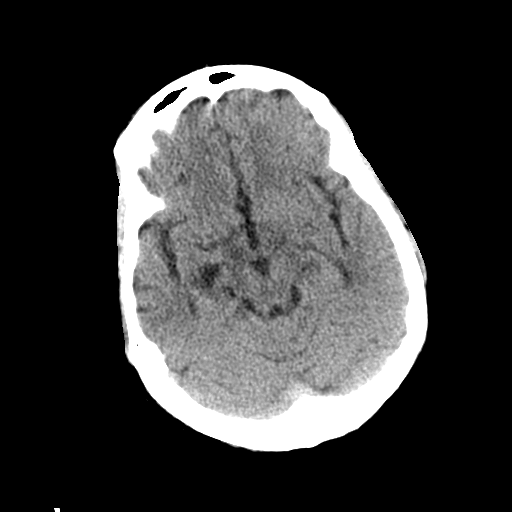
[im 16/31  brain]
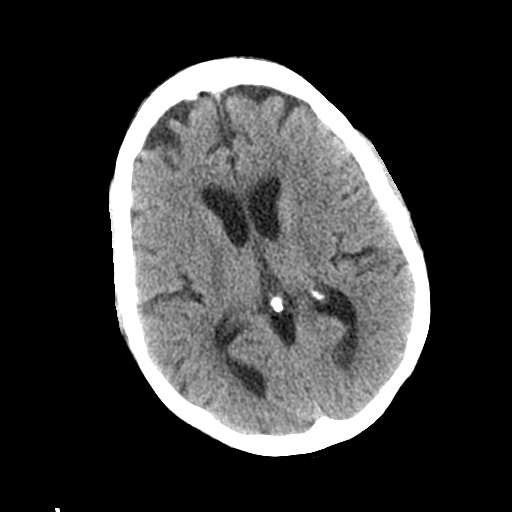
[im 16/31  bone]
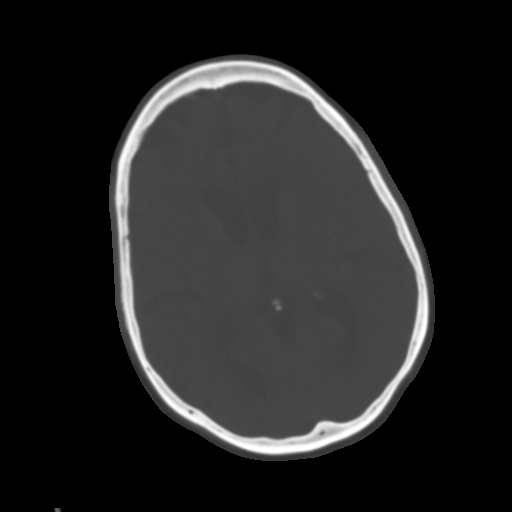
[im 19/31  brain]
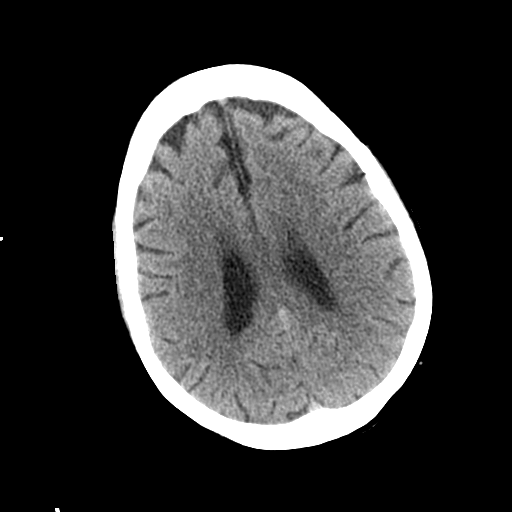
[im 22/31  brain]
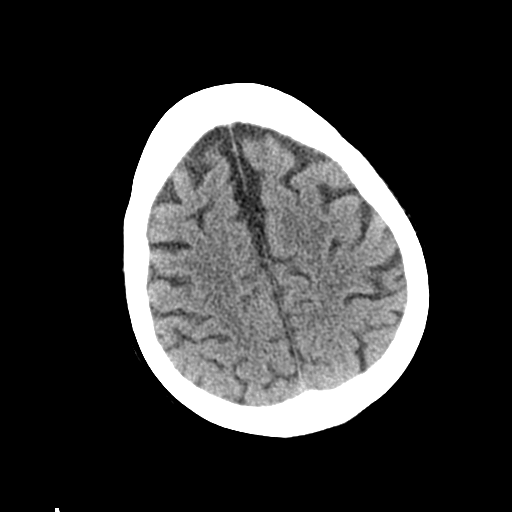
[im 25/31  brain]
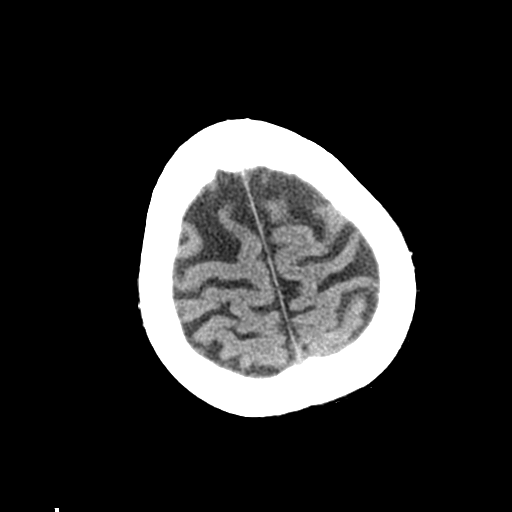
[im 28/31  brain]
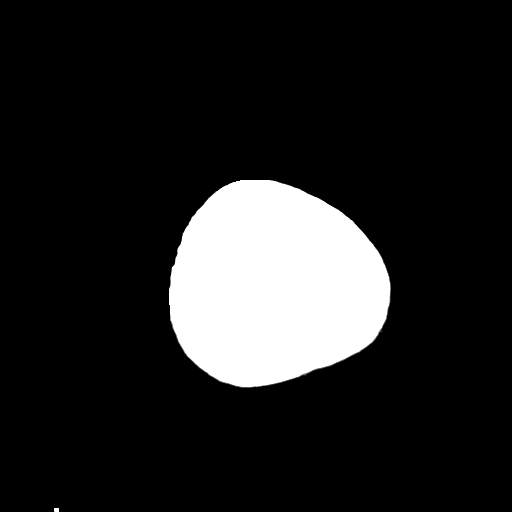
[im 28/31  bone]
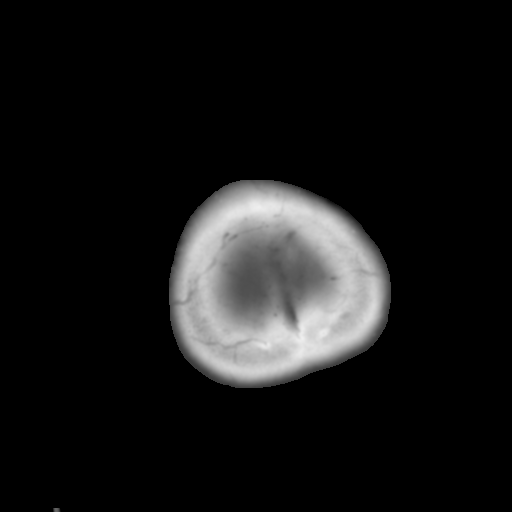

[Series 4: coronal soft tissue · coronal · 0.33mm/px · 3 of 59 slices shown]
[im 20/59  brain]
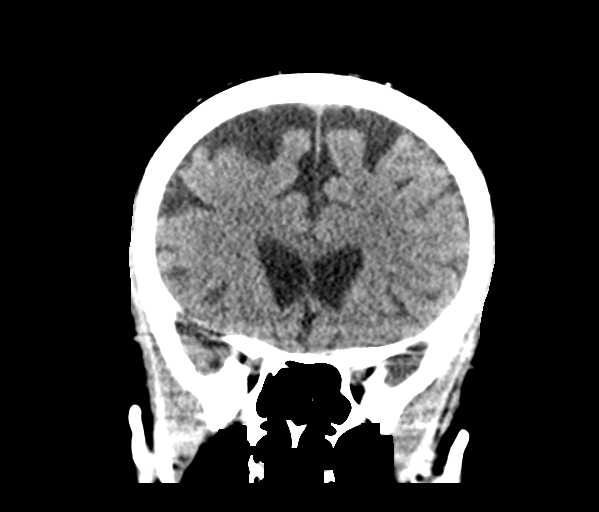
[im 26/59  brain]
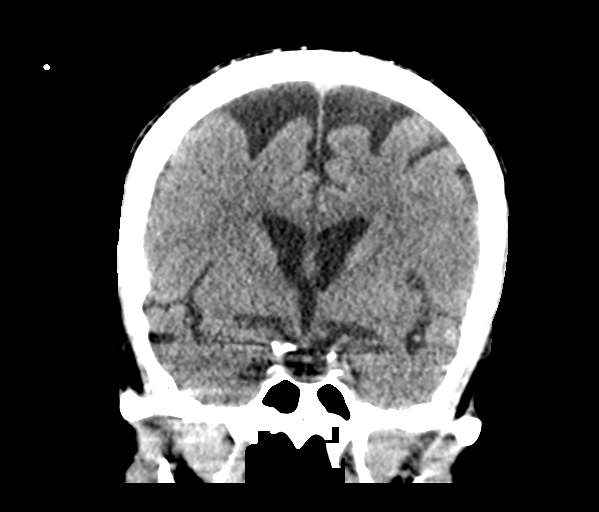
[im 33/59  brain]
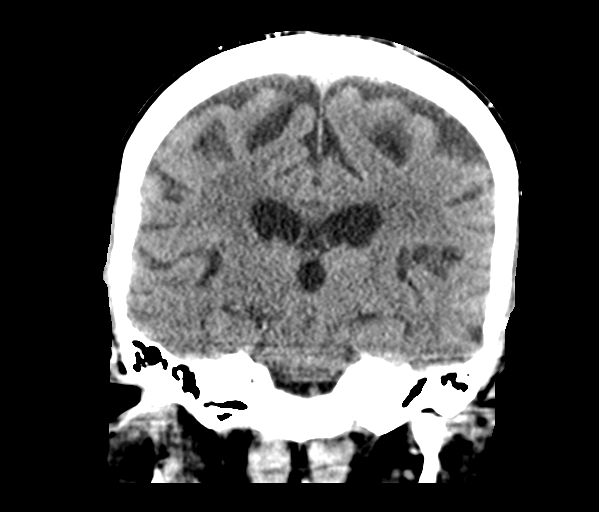

[Series 5: sagittal soft tissue · sagittal · 0.34mm/px · 3 of 46 slices shown]
[im 16/46  brain]
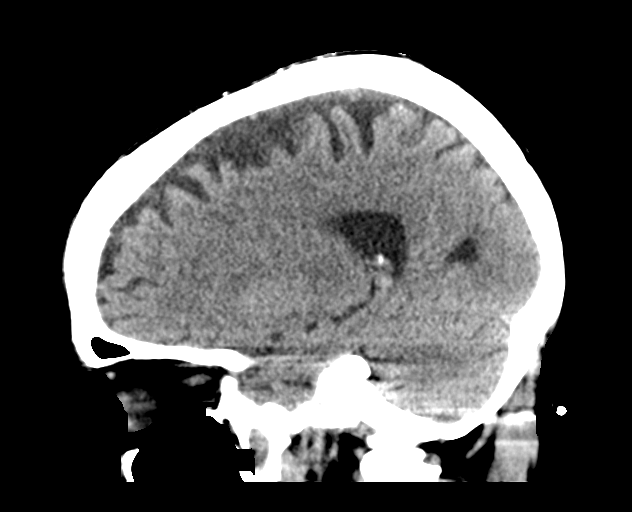
[im 23/46  brain]
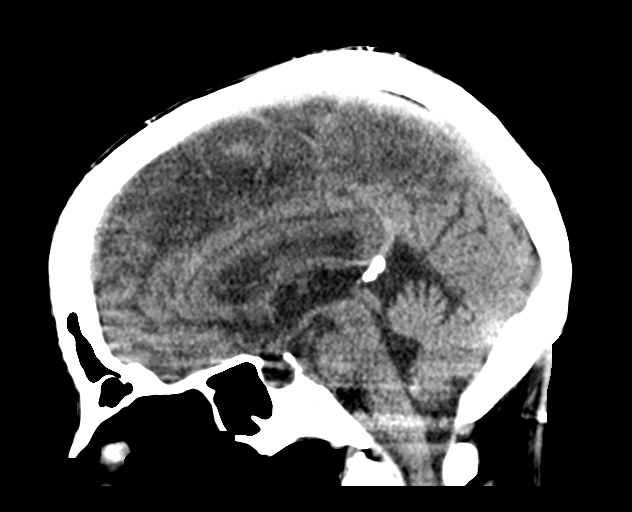
[im 31/46  brain]
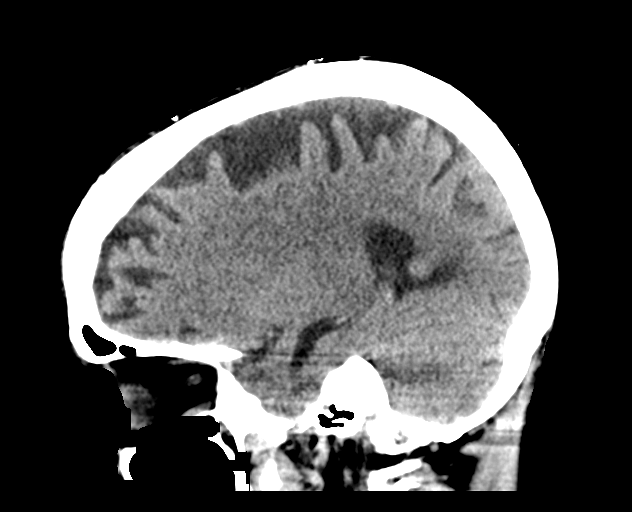

[15 of 47 positions shown; findings below may reference images not displayed]

FINDINGS: Brain: Mildly advanced cerebral atrophy. This results in mild
prominence of the the lateral ventricles, similar. No mass lesion,
acute infarct, intra-axial, or extra-axial fluid collection.

Vascular: No hyperdense vessel or unexpected calcification.

Skull: Normal skull.

Sinuses/Orbits: Normal imaged portions of the orbits and globes.
Minimal fluid or mucosal thickening in the right maxillary sinus.
Clear mastoid air cells.

Other: None.
IMPRESSION: 1.  No acute intracranial abnormality.
2. Cerebral atrophy

## 2019-12-27 MED ORDER — INSULIN REGULAR(HUMAN) IN NACL 100-0.9 UT/100ML-% IV SOLN
INTRAVENOUS | Status: DC
  Administered 2019-12-27: 2.8 [IU]/h via INTRAVENOUS

## 2019-12-27 MED ORDER — SODIUM CHLORIDE 0.9 % IV BOLUS
1000.0000 mL | Freq: Once | INTRAVENOUS | Status: AC
  Administered 2019-12-27: 18:00:00 1000 mL via INTRAVENOUS

## 2019-12-27 MED ORDER — ROSUVASTATIN CALCIUM 20 MG PO TABS
20.0000 mg | ORAL_TABLET | Freq: Every day | ORAL | Status: DC
  Administered 2019-12-27: 20 mg via ORAL
  Filled 2019-12-27: qty 1

## 2019-12-27 MED ORDER — SODIUM CHLORIDE 0.9 % IV SOLN: INTRAVENOUS | Status: DC

## 2019-12-27 MED ORDER — SODIUM CHLORIDE 0.9 % IV BOLUS
500.0000 mL | Freq: Once | INTRAVENOUS | Status: AC
  Administered 2019-12-27: 500 mL via INTRAVENOUS

## 2019-12-27 MED ORDER — ASPIRIN 81 MG PO CHEW
81.0000 mg | CHEWABLE_TABLET | Freq: Every day | ORAL | Status: DC
  Administered 2019-12-27 – 2020-01-01 (×6): 81 mg via ORAL
  Filled 2019-12-27 (×6): qty 1

## 2019-12-27 MED ORDER — DEXTROSE 50 % IV SOLN: 0.0000 mL | INTRAVENOUS | Status: DC | PRN

## 2019-12-27 MED ORDER — INSULIN REGULAR(HUMAN) IN NACL 100-0.9 UT/100ML-% IV SOLN
INTRAVENOUS | Status: DC
  Administered 2019-12-27 (×2): 6 [IU]/h via INTRAVENOUS
  Filled 2019-12-27: qty 100

## 2019-12-27 MED ORDER — THIAMINE HCL 100 MG/ML IJ SOLN
100.0000 mg | Freq: Every day | INTRAMUSCULAR | Status: DC
  Administered 2019-12-27: 100 mg via INTRAVENOUS
  Filled 2019-12-27: qty 2

## 2019-12-27 MED ORDER — DEXTROSE-NACL 5-0.45 % IV SOLN: INTRAVENOUS | Status: DC

## 2019-12-27 MED ORDER — ENOXAPARIN SODIUM 30 MG/0.3ML ~~LOC~~ SOLN
30.0000 mg | Freq: Every day | SUBCUTANEOUS | Status: DC
  Administered 2019-12-27: 30 mg via SUBCUTANEOUS
  Filled 2019-12-27: qty 0.3

## 2019-12-27 MED ORDER — SODIUM CHLORIDE 0.9 % IV BOLUS: 1000.0000 mL | Freq: Once | INTRAVENOUS | Status: DC
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
## 2019-12-27 NOTE — ED Notes (Signed)
ED TO INPATIENT HANDOFF REPORT  Name/Age/Gender Mary Swanson 64 y.o. female  Code Status   Home/SNF/Other Home  Chief Complaint Hyperosmolar hyperglycemic state (HHS) (HCC) [E11.00, E11.65]  Level of Care/Admitting Diagnosis ED Disposition    ED Disposition Condition Comment   Admit  Hospital Area: [facility] [[id number]]  Level of Care: Stepdown [14]  Admit to SDU based on following criteria: Other see comments  Comments: hyperglycemia  Covid Evaluation: Confirmed COVID Negative  Date Laboratory Confirmed COVID Negative: 12/27/2019  Diagnosis: Hyperosmolar hyperglycemic state (HHS) [facility]) [[id number]]  Admitting Physician: Therisa Doyne [[id number]]  Attending Physician: Therisa Doyne [[id number]]       Medical History Past Medical History:  Diagnosis Date  . Diabetes mellitus without complication (HCC)   . Hypertension     Allergies Allergies  Allergen Reactions  . Erythromycin Diarrhea  . Latex Hives  . Lisinopril Cough    IV Location/Drains/Wounds Patient Lines/Drains/Airways Status   Active Line/Drains/Airways    Name:   Placement date:   Placement time:   Site:   Days:   Peripheral IV 12/27/19 Left Hand   12/27/19    1738    Hand   less than 1   Peripheral IV 12/27/19 Left Antecubital   12/27/19    1749    Antecubital   less than 1   External Urinary Catheter   12/27/19    1921    --   less than 1          Labs/Imaging Results for orders placed or performed during the hospital encounter of 12/27/19 (from the past 48 hour(s))  POC CBG, ED     Status: Abnormal   Collection Time: 12/27/19  4:35 PM  Result Value Ref Range   Glucose-Capillary >600 (HH) 70 - 99 mg/dL  Urinalysis, Routine w reflex microscopic     Status: Abnormal   Collection Time: 12/27/19  4:35 PM  Result Value Ref Range   Color, Urine STRAW (A) YELLOW   APPearance CLEAR CLEAR   Specific Gravity, Urine 1.031 (H) 1.005 - 1.030   pH 5.0 5.0 - 8.0   Glucose, UA >=500  (A) NEGATIVE mg/dL   Hgb urine dipstick SMALL (A) NEGATIVE   Bilirubin Urine NEGATIVE NEGATIVE   Ketones, ur NEGATIVE NEGATIVE mg/dL   Protein, ur NEGATIVE NEGATIVE mg/dL   Nitrite NEGATIVE NEGATIVE   Leukocytes,Ua NEGATIVE NEGATIVE   RBC / HPF 0-5 0 - 5 RBC/hpf   WBC, UA 0-5 0 - 5 WBC/hpf   Bacteria, UA RARE (A) NONE SEEN    Comment: Performed at [facility], [address] [place], [place] [address]  Rapid urine drug screen (hospital performed)     Status: None   Collection Time: 12/27/19  4:35 PM  Result Value Ref Range   Opiates NONE DETECTED NONE DETECTED   Cocaine NONE DETECTED NONE DETECTED   Benzodiazepines NONE DETECTED NONE DETECTED   Amphetamines NONE DETECTED NONE DETECTED   Tetrahydrocannabinol NONE DETECTED NONE DETECTED   Barbiturates NONE DETECTED NONE DETECTED    Comment: (NOTE) DRUG SCREEN FOR MEDICAL PURPOSES ONLY.  IF CONFIRMATION IS NEEDED FOR ANY PURPOSE, NOTIFY LAB WITHIN 5 DAYS. LOWEST DETECTABLE LIMITS FOR URINE DRUG SCREEN Drug Class                     Cutoff (ng/mL) Amphetamine and metabolites    1000 Barbiturate and metabolites    200 Benzodiazepine  741 Tricyclics and metabolites     300 Opiates and metabolites        300 Cocaine and metabolites        300 THC                            50 Performed at [facility], [place] [address], [place], [place] [address]   CBC with Differential     Status: Abnormal   Collection Time: 12/27/19  5:20 PM  Result Value Ref Range   WBC 9.5 4.0 - 10.5 K/uL   RBC 4.50 3.87 - 5.11 MIL/uL   Hemoglobin 13.1 12.0 - 15.0 g/dL   HCT 42.6 36.0 - 46.0 %   MCV 94.7 80.0 - 100.0 fL   MCH 29.1 26.0 - 34.0 pg   MCHC 30.8 30.0 - 36.0 g/dL   RDW 13.2 11.5 - 15.5 %   Platelets 321 150 - 400 K/uL   nRBC 0.0 0.0 - 0.2 %   Neutrophils Relative % 89 %   Neutro Abs 8.5 (H) 1.7 - 7.7 K/uL   Lymphocytes Relative 6 %   Lymphs Abs 0.6 (L) 0.7 - 4.0 K/uL   Monocytes  Relative 5 %   Monocytes Absolute 0.4 0.1 - 1.0 K/uL   Eosinophils Relative 0 %   Eosinophils Absolute 0.0 0.0 - 0.5 K/uL   Basophils Relative 0 %   Basophils Absolute 0.0 0.0 - 0.1 K/uL   Immature Granulocytes 0 %   Abs Immature Granulocytes 0.04 0.00 - 0.07 K/uL    Comment: Performed at [facility], [place] [address], [place], [place] [address]  Comprehensive metabolic panel     Status: Abnormal   Collection Time: 12/27/19  5:20 PM  Result Value Ref Range   Sodium 140 135 - 145 mmol/L   Potassium 5.2 (H) 3.5 - 5.1 mmol/L   Chloride 102 98 - 111 mmol/L   CO2 25 22 - 32 mmol/L   Glucose, Bld 911 (HH) 70 - 99 mg/dL    Comment: CRITICAL RESULT CALLED TO, READ BACK BY AND VERIFIED WITH: K.Kerrianne Jeng,RN [id number] @1824  BY V.WILKINS    BUN 19 8 - 23 mg/dL   Creatinine, Ser 1.09 (H) 0.44 - 1.00 mg/dL   Calcium 10.3 8.9 - 10.3 mg/dL   Total Protein 8.1 6.5 - 8.1 g/dL   Albumin 4.3 3.5 - 5.0 g/dL   AST 52 (H) 15 - 41 U/L   ALT 28 0 - 44 U/L   Alkaline Phosphatase 105 38 - 126 U/L   Total Bilirubin 0.9 0.3 - 1.2 mg/dL   GFR calc non Af Amer 53 (L) >60 mL/min   GFR calc Af Amer >60 >60 mL/min   Anion gap 13 5 - 15    Comment: Performed at [facility], [place] [address] [place], [place] [address]  Lipase, blood     Status: None   Collection Time: 12/27/19  5:20 PM  Result Value Ref Range   Lipase 19 11 - 51 U/L    Comment: Performed at [facility], [place] [address], [place], [place] [address]  Ethanol     Status: None   Collection Time: 12/27/19  5:20 PM  Result Value Ref Range   Alcohol, Ethyl (B) <10 <10 mg/dL    Comment: (NOTE) Lowest detectable limit for serum alcohol is 10 mg/dL. For medical purposes only. Performed at [facility], [place]  [address] [place], [place] [address]   Blood gas, venous (at [facility], not at [facility])     Status: Abnormal   Collection Time: 12/27/19  5:20 PM  Result Value Ref Range   pH,  Ven 7.33 7.250 - 7.430   pCO2, Ven 56.9 44.0 - 60.0 mmHg   pO2, Ven <31.0 (LL) 32.0 - 45.0 mmHg    Comment: CRITICAL RESULT CALLED TO, READ BACK BY AND VERIFIED WITH: K.Rutger Salton,RN [id number] @1746  BY V.WILKINS    Bicarbonate 29.4 (H) 20.0 - 28.0 mmol/L   Acid-Base Excess 2.7 (H) 0.0 - 2.0 mmol/L   O2 Saturation 46.7 %   Patient temperature 98.6     Comment: Performed at [facility], [address] [place], [place]  Beta-hydroxybutyric acid     Status: None   Collection Time: 12/27/19  5:20 PM  Result Value Ref Range   Beta-Hydroxybutyric Acid 0.26 0.05 - 0.27 mmol/L    Comment: Performed at [facility], [address] [place], [place]  Respiratory Panel by RT PCR (Flu A&B, Covid) - Nasopharyngeal Swab     Status: None   Collection Time: 12/27/19  5:45 PM   Specimen: Nasopharyngeal Swab  Result Value Ref Range   SARS Coronavirus 2 by RT PCR NEGATIVE NEGATIVE    Comment: (NOTE) SARS-CoV-2 target nucleic acids are NOT DETECTED. The SARS-CoV-2 RNA is generally detectable in upper respiratoy specimens during the acute phase of infection. The lowest concentration of SARS-CoV-2 viral copies this assay can detect is 131 copies/mL. A negative result does not preclude SARS-Cov-2 infection and should not be used as the sole basis for treatment or other patient management decisions. A negative result may occur with  improper specimen collection/handling, submission of specimen other than nasopharyngeal swab, presence of viral mutation(s) within the areas targeted by this assay, and inadequate number of viral copies (<131 copies/mL). A negative result must be combined with clinical observations, patient history, and epidemiological information. The expected result is Negative. Fact Sheet for Patients:  12/29/19 Fact Sheet for Healthcare Providers:  https://www.moore.com/ This test is  not yet ap proved or cleared by the https://www.young.biz/ FDA and  has been authorized for detection and/or diagnosis of SARS-CoV-2 by FDA under an Emergency Use Authorization (EUA). This EUA will remain  in effect (meaning this test can be used) for the duration of the COVID-19 declaration under Section 564(b)(1) of the Act, 21 U.S.C. section 360bbb-3(b)(1), unless the authorization is terminated or revoked sooner.    Influenza A by PCR NEGATIVE NEGATIVE   Influenza B by PCR NEGATIVE NEGATIVE    Comment: (NOTE) The Xpert Xpress SARS-CoV-2/FLU/RSV assay is intended as an aid in  the diagnosis of influenza from Nasopharyngeal swab specimens and  should not be used as a sole basis for treatment. Nasal washings and  aspirates are unacceptable for Xpert Xpress SARS-CoV-2/FLU/RSV  testing. Fact Sheet for Patients: [place] Fact Sheet for Healthcare Providers: https://www.moore.com/ This test is not yet approved or cleared by the https://www.young.biz/ FDA and  has been authorized for detection and/or diagnosis of SARS-CoV-2 by  FDA under an Emergency Use Authorization (EUA). This EUA will remain  in effect (meaning this test can be used) for the duration of the  Covid-19 declaration under Section 564(b)(1) of the Act, 21  U.S.C. section 360bbb-3(b)(1), unless the authorization is  terminated or revoked. Performed at [facility], [address], [place], [place]   CBG monitoring,  ED     Status: Abnormal   Collection Time: 12/27/19  6:51 PM  Result Value Ref Range   Glucose-Capillary >600 (HH) 70 - 99 mg/dL  Troponin I (High Sensitivity)     Status: None   Collection Time: 12/27/19  7:17 PM  Result Value Ref Range   Troponin I (High Sensitivity) 3 <18 ng/L    Comment: (NOTE) Elevated high sensitivity troponin I (hsTnI) values and significant  changes across serial measurements may suggest ACS but many other  chronic  and acute conditions are known to elevate hsTnI results.  Refer to the "Links" section for chest pain algorithms and additional  guidance. Performed at [facility], [address] [place], [place] [address]   CK     Status: Abnormal   Collection Time: 12/27/19  7:17 PM  Result Value Ref Range   Total CK 1,948 (H) 38 - 234 U/L    Comment: Performed at [facility], [address] [place], [place] [address]  Magnesium     Status: Abnormal   Collection Time: 12/27/19  7:17 PM  Result Value Ref Range   Magnesium 2.6 (H) 1.7 - 2.4 mg/dL    Comment: Performed at [facility], [address] [place], [place] [address]  Phosphorus     Status: Abnormal   Collection Time: 12/27/19  7:17 PM  Result Value Ref Range   Phosphorus 5.1 (H) 2.5 - 4.6 mg/dL    Comment: Performed at [facility], [address] [place], [place] [address]  CBG monitoring, ED     Status: Abnormal   Collection Time: 12/27/19  8:20 PM  Result Value Ref Range   Glucose-Capillary 524 (HH) 70 - 99 mg/dL  CBG monitoring, ED     Status: Abnormal   Collection Time: 12/27/19  8:57 PM  Result Value Ref Range   Glucose-Capillary 287 (H) 70 - 99 mg/dL   CT Head Wo Contrast  Result Date: 12/27/2019 CLINICAL DATA:  Altered mental status EXAM: CT HEAD WITHOUT CONTRAST TECHNIQUE: Contiguous axial images were obtained from the base of the skull through the vertex without intravenous contrast. COMPARISON:  07/27/2019 FINDINGS: Brain: Mildly advanced cerebral atrophy. This results in mild prominence of the the lateral ventricles, similar. No mass lesion, acute infarct, intra-axial, or extra-axial fluid collection. Vascular: No hyperdense vessel or unexpected calcification. Skull: Normal skull. Sinuses/Orbits: Normal imaged portions of the orbits and globes. Minimal fluid or mucosal thickening in the right maxillary sinus. Clear mastoid air cells. Other: None.  IMPRESSION: 1.  No acute intracranial abnormality. 2. Cerebral atrophy Electronically Signed   By: Jeronimo Greaves M.D.   On: 12/27/2019 17:55   DG Chest Portable 1 View  Result Date: 12/27/2019 CLINICAL DATA:  Altered mental status EXAM: PORTABLE CHEST 1 VIEW COMPARISON:  None. FINDINGS: The heart size is normal. There is no pneumothorax or large pleural effusion. There is a possible retrocardiac opacity that is somewhat suboptimally evaluated secondary to an overlapping breast implant. Aortic calcifications are noted. There are peripherally calcified breast implants. IMPRESSION: Questionable retrocardiac opacity which may represent atelectasis or infiltrate. An overlapping breast implant limits evaluation of this finding. Electronically Signed   By: Katherine Mantle M.D.   On: 12/27/2019 17:43    Pending Labs Unresulted Labs (From admission, onward)    Start     Ordered   12/28/19 0500  Prealbumin  Tomorrow morning,   R     12/27/19 1940  12/28/19 0500  CK  Tomorrow morning,   R     12/27/19 2056   12/27/19 2015  Creatinine, urine, random  Once,   STAT     12/27/19 2014   12/27/19 2015  Sodium, urine, random  Once,   STAT     12/27/19 2014   12/27/19 1835  Osmolality  (Hyperglycemic Hyperosmolar State (HHS))  Once,   STAT     12/27/19 1836   12/27/19 1635  Urine culture  ONCE - STAT,   STAT     12/27/19 1635   Signed and Held  HIV Antibody (routine testing w rflx)  (HIV Antibody (Routine testing w reflex) panel)  Tomorrow morning,   R     Signed and Held   Signed and Held  Basic metabolic panel  (Hyperglycemic Hyperosmolar State (HHS))  STAT Now then every 4 hours ,   STAT     Signed and Held   Signed and Held  Osmolality  (Hyperglycemic Hyperosmolar State (HHS))  Once,   R     Signed and Held   Signed and Held  Hemoglobin A1c  (Hyperglycemic Hyperosmolar State (HHS))  Tomorrow morning,   R    Comments: To assess prior glycemic control.    Signed and Held   Signed and Held   Magnesium  (Hyperglycemic Hyperosmolar State (HHS))  Tomorrow morning,   R     Signed and Held   Signed and Held  Phosphorus  (Hyperglycemic Hyperosmolar State (HHS))  Tomorrow morning,   R     Signed and Held          Vitals/Pain Today's Vitals   12/27/19 1930 12/27/19 2000 12/27/19 2030 12/27/19 2100  BP: (!) 171/100 (!) 184/75 (!) 150/69 (!) 168/72  Pulse: (!) 102 (!) 105 (!) 104 (!) 105  Resp: 20 16 (!) 22 14  Temp:      TempSrc:      SpO2: 100% 100% 100% 100%    Isolation Precautions No active isolations  Medications Medications  insulin regular, human (MYXREDLIN) 100 units/ 100 mL infusion (2.2 Units/hr Intravenous Rate/Dose Change 12/27/19 2058)  0.9 %  sodium chloride infusion ( Intravenous New Bag/Given 12/27/19 2025)  dextrose 5 %-0.45 % sodium chloride infusion (has no administration in time range)  dextrose 50 % solution 0-50 mL (has no administration in time range)  thiamine (B-1) injection 100 mg (has no administration in time range)  sodium chloride 0.9 % bolus 500 mL (has no administration in time range)  sodium chloride 0.9 % bolus 1,000 mL (0 mLs Intravenous Stopped 12/27/19 2048)    Mobility walks

## 2019-12-27 NOTE — ED Notes (Signed)
When pt arrived her brief was full of urine and feces.  RN and tech cleaned pt up and put a new brief on pt.

## 2019-12-27 NOTE — ED Triage Notes (Signed)
Pt BIB [facility] from home. EMS was called out by [employer] due to pt being altered, walking around neighborhood with only a brief on. Per EMS pts living conditions are poor with urine, feces, trash and bugs all over the house. Pt is only oriented to first name and city.

## 2019-12-27 NOTE — ED Notes (Signed)
Date and time results received: 12/27/19 1824  (use smartphrase ".now" to insert current time)  Test: Glucose Critical Value: 911  Name of Provider Notified: [place], DO  Orders Received? Or Actions Taken?: Orders Received - See Orders for details

## 2019-12-27 NOTE — ED Notes (Signed)
Date and time results received: 12/27/19 1745  (use smartphrase ".now" to insert current time)  Test: VBG Critical Value: O2<31  Name of Provider Notified: [place], DO  Orders Received? Or Actions Taken?: Orders Received - See Orders for details

## 2019-12-27 NOTE — ED Provider Notes (Signed)
[facility] DEPT Provider Note   CSN: [id number] Arrival date & time: 12/27/19  1609     History Chief Complaint  Patient presents with  . Hyperglycemia  . Altered Mental Status    Mary Swanson is a 64 y.o. female.  The history is provided by the patient and the EMS personnel.  Altered Mental Status Presenting symptoms: confusion (hyperglycemic)   Episode history:  Unable to specify Timing:  Unable to specify Progression:  Unable to specify Chronicity:  Recurrent Context comment:  Diabetes hx, patient found walking outside with underwear on. Patient per EMS living in very poor condition by herself. [place] state hx of drug use possibly per EMS. Although patient denies. Patient found to have high blood suger with EMS.  Associated symptoms: no abdominal pain, no agitation, no bladder incontinence, no decreased appetite, no depression, no eye deviation, no fever, no hallucinations, no headaches, no light-headedness, no nausea, no palpitations, no rash, no seizures, no vomiting and no weakness        Past Medical History:  Diagnosis Date  . Diabetes mellitus without complication ([place])   . Hypertension     Patient Active Problem List   Diagnosis Date Noted  . Elevated blood pressure reading 12/03/2019  . Memory loss of unknown cause 12/03/2019  . Hyperglycemia 12/03/2019  . ACE-inhibitor cough 03/14/2019  . Diabetes mellitus without complication ([place])   . Heart murmur, systolic 35/24/8185  . DIABETES MELLITUS II, UNCOMPLICATED 90/93/1121  . HYPERLIPIDEMIA 01/24/2007  . OBESITY, NOS 01/24/2007  . Anemia 01/24/2007  . History of depression 01/24/2007  . RHINITIS, ALLERGIC 01/24/2007    Past Surgical History:  Procedure Laterality Date  . CATARACT EXTRACTION, BILATERAL Bilateral      OB History   No obstetric history on file.     No family history on file.  Social History   Tobacco Use  . Smoking status: Never Smoker  .  Smokeless tobacco: Never Used  Substance Use Topics  . Alcohol use: Not Currently  . Drug use: Never    Home Medications Prior to Admission medications   Medication Sig Start Date End Date Taking? Authorizing Provider  AgaMatrix Ultra-Thin Lancets MISC Check blood glucose 3 times daily before meals and as needed 06/25/19   Mack Hook, MD  aspirin 81 MG chewable tablet Chew 81 mg by mouth daily.     [provider]  Blood Glucose Monitoring Suppl (AGAMATRIX PRESTO) w/Device KIT Check blood glucose three times daily before meals and as needed 06/25/19   Mack Hook, MD  Ferrous Sulfate (SLOW FE) 142 (45 Fe) MG TBCR Take 1 tablet by mouth daily.    [provider]  glucose 4 GM chewable tablet Chew 1 tablet by mouth as needed for low blood sugar.    [provider]  glucose blood (AGAMATRIX PRESTO TEST) test strip Check blood glucose 3 times daily before meals and as needed 06/25/19   Mack Hook, MD  Insulin Glargine (LANTUS SOLOSTAR) 100 UNIT/ML Solostar Pen 30 units injected subcutaneously once daily in morning before breakfast Patient taking differently: Inject 10 Units into the skin 3 (three) times daily. 30 units injected subcutaneously once daily in morning before breakfast 06/25/19   Mack Hook, MD  insulin lispro (HUMALOG KWIKPEN) 100 UNIT/ML KwikPen 3 units injected subcutaneously 10 minutes before meals 3 times daily Patient not taking: Reported on 07/27/2019 06/25/19   Mack Hook, MD  insulin NPH Human (NOVOLIN N) 100 UNIT/ML injection 10 units injected Subcutaneously  in AM and 5 units injected in PM before meals Patient taking differently: Inject 6-10 Units into the skin 2 (two) times daily before a meal. 10 units injected Subcutaneously in AM and 5 units injected in PM before meals 06/20/19   Mack Hook, MD  Insulin Pen Needle (BD PEN NEEDLE NANO U/F) 32G X 4 MM MISC 1 Stick by Does not apply route every morning.  06/05/17   Eloise Levels, MD  losartan (COZAAR) 100 MG tablet 1 tab by mouth daily. Patient taking differently: Take 100 mg by mouth daily. 1 tab by mouth daily. 06/25/19   Mack Hook, MD  metFORMIN (GLUCOPHAGE-XR) 500 MG 24 hr tablet 1 tab by mouth twice daily with meals Patient taking differently: Take 500 mg by mouth 2 (two) times daily. 1 tab by mouth twice daily with meals 06/20/19   Mack Hook, MD  NOVOLIN R RELION 100 UNIT/ML injection INJECT 3 UNITS INTO THE SKIN 3 TIMES DAILY BEFORE MEALS. CHECK BLOOD SUGAR PRIOR TO EACH MEAL. Patient not taking: Reported on 07/27/2019 06/20/19   Mack Hook, MD  rosuvastatin (CRESTOR) 20 MG tablet Take 1 tablet (20 mg total) by mouth daily. 10/02/19   Autry-Lott, Naaman Plummer, DO    Allergies    Erythromycin, Latex, and Lisinopril  Review of Systems   Review of Systems  Constitutional: Negative for chills, decreased appetite and fever.  HENT: Negative for ear pain and sore throat.   Eyes: Negative for pain and visual disturbance.  Respiratory: Negative for cough and shortness of breath.   Cardiovascular: Negative for chest pain and palpitations.  Gastrointestinal: Negative for abdominal pain, nausea and vomiting.  Genitourinary: Negative for bladder incontinence, dysuria and hematuria.  Musculoskeletal: Negative for arthralgias and back pain.  Skin: Negative for color change and rash.  Neurological: Negative for seizures, syncope, weakness, light-headedness and headaches.  Psychiatric/Behavioral: Positive for confusion (hyperglycemic). Negative for agitation and hallucinations.  All other systems reviewed and are negative.   Physical Exam Updated Vital Signs  ED Triage Vitals [12/27/19 1642]  Enc Vitals Group     BP (!) 172/99     Pulse Rate (!) 104     Resp 13     Temp 97.6 F (36.4 C)     Temp Source Oral     SpO2 97 %     Weight      Height      Head Circumference      Peak Flow      Pain Score      Pain Loc       Pain Edu?      Excl. in [place]?     Physical Exam Vitals and nursing note reviewed.  Constitutional:      General: She is not in acute distress.    Appearance: She is well-developed. She is not ill-appearing.  HENT:     Head: Normocephalic and atraumatic.     Nose: Nose normal.     Mouth/Throat:     Mouth: Mucous membranes are moist.     Pharynx: No oropharyngeal exudate or posterior oropharyngeal erythema.  Eyes:     Extraocular Movements: Extraocular movements intact.     Conjunctiva/sclera: Conjunctivae normal.     Pupils: Pupils are equal, round, and reactive to light.  Cardiovascular:     Rate and Rhythm: Normal rate and regular rhythm.     Pulses: Normal pulses.     Heart sounds: Normal heart sounds. No murmur.  Pulmonary:  Effort: Pulmonary effort is normal. No respiratory distress.     Breath sounds: Normal breath sounds.  Abdominal:     General: There is no distension.     Palpations: Abdomen is soft.     Tenderness: There is no abdominal tenderness.  Musculoskeletal:        General: Normal range of motion.     Cervical back: Neck supple.     Right lower leg: No edema.     Left lower leg: No edema.  Skin:    General: Skin is warm and dry.  Neurological:     General: No focal deficit present.     Mental Status: She is alert.     Comments: Patient moves all extremities, normal speech, slow to answer questions but no obvious aphasia or facial droop  Psychiatric:        Mood and Affect: Mood normal.     ED Results / Procedures / Treatments   Labs (all labs ordered are listed, but only abnormal results are displayed) Labs Reviewed  CBC WITH DIFFERENTIAL/PLATELET - Abnormal; Notable for the following components:      Result Value   Neutro Abs 8.5 (*)    Lymphs Abs 0.6 (*)    All other components within normal limits  COMPREHENSIVE METABOLIC PANEL - Abnormal; Notable for the following components:   Potassium 5.2 (*)    Glucose, Bld 911 (*)     Creatinine, Ser 1.09 (*)    AST 52 (*)    GFR calc non Af Amer 53 (*)    All other components within normal limits  BLOOD GAS, VENOUS - Abnormal; Notable for the following components:   pO2, Ven <31.0 (*)    Bicarbonate 29.4 (*)    Acid-Base Excess 2.7 (*)    All other components within normal limits  CBG MONITORING, ED - Abnormal; Notable for the following components:   Glucose-Capillary >600 (*)    All other components within normal limits  CBG MONITORING, ED - Abnormal; Notable for the following components:   Glucose-Capillary >600 (*)    All other components within normal limits  URINE CULTURE  RESPIRATORY PANEL BY RT PCR (FLU A&B, COVID)  LIPASE, BLOOD  ETHANOL  URINALYSIS, ROUTINE W REFLEX MICROSCOPIC  RAPID URINE DRUG SCREEN, HOSP PERFORMED  BETA-HYDROXYBUTYRIC ACID  OSMOLALITY    EKG EKG Interpretation  Date/Time:  Saturday December 27 2019 16:29:53 EST Ventricular Rate:  105 PR Interval:    QRS Duration: 82 QT Interval:  339 QTC Calculation: 448 R Axis:   15 Text Interpretation: Sinus tachycardia Confirmed by Lennice Sites [phone]) on 12/27/2019 6:37:43 PM   Radiology CT Head Wo Contrast  Result Date: 12/27/2019 CLINICAL DATA:  Altered mental status EXAM: CT HEAD WITHOUT CONTRAST TECHNIQUE: Contiguous axial images were obtained from the base of the skull through the vertex without intravenous contrast. COMPARISON:  07/27/2019 FINDINGS: Brain: Mildly advanced cerebral atrophy. This results in mild prominence of the the lateral ventricles, similar. No mass lesion, acute infarct, intra-axial, or extra-axial fluid collection. Vascular: No hyperdense vessel or unexpected calcification. Skull: Normal skull. Sinuses/Orbits: Normal imaged portions of the orbits and globes. Minimal fluid or mucosal thickening in the right maxillary sinus. Clear mastoid air cells. Other: None. IMPRESSION: 1.  No acute intracranial abnormality. 2. Cerebral atrophy Electronically Signed   By: Abigail Miyamoto M.D.   On: 12/27/2019 17:55   DG Chest Portable 1 View  Result Date: 12/27/2019 CLINICAL DATA:  Altered mental status EXAM: PORTABLE  CHEST 1 VIEW COMPARISON:  None. FINDINGS: The heart size is normal. There is no pneumothorax or large pleural effusion. There is a possible retrocardiac opacity that is somewhat suboptimally evaluated secondary to an overlapping breast implant. Aortic calcifications are noted. There are peripherally calcified breast implants. IMPRESSION: Questionable retrocardiac opacity which may represent atelectasis or infiltrate. An overlapping breast implant limits evaluation of this finding. Electronically Signed   By: Constance Holster M.D.   On: 12/27/2019 17:43    Procedures .Critical Care Performed by: Lennice Sites, DO Authorized by: Lennice Sites, DO   Critical care provider statement:    Critical care time (minutes):  40   Critical care was necessary to treat or prevent imminent or life-threatening deterioration of the following conditions:  Metabolic crisis   Critical care was time spent personally by me on the following activities:  Blood draw for specimens, development of treatment plan with patient or surrogate, discussions with primary provider, evaluation of patient's response to treatment, examination of patient, obtaining history from patient or surrogate, ordering and performing treatments and interventions, ordering and review of laboratory studies, ordering and review of radiographic studies, pulse oximetry, re-evaluation of patient's condition and review of old charts   I assumed direction of critical care for this patient from another provider in my specialty: no     (including critical care time)  Medications Ordered in ED Medications  insulin regular, human (MYXREDLIN) 100 units/ 100 mL infusion (has no administration in time range)  0.9 %  sodium chloride infusion (has no administration in time range)  dextrose 5 %-0.45 % sodium chloride  infusion (has no administration in time range)  dextrose 50 % solution 0-50 mL (has no administration in time range)  sodium chloride 0.9 % bolus 1,000 mL (1,000 mLs Intravenous New Bag/Given 12/27/19 1748)    ED Course  I have reviewed the triage vital signs and the nursing notes.  Pertinent labs & imaging results that were available during my care of the patient were reviewed by me and considered in my medical decision making (see chart for details).    MDM Rules/Calculators/A&P  Mary Swanson is a 64 year old female with history of hypertension, insulin-dependent diabetes who presents to the ED with high blood sugar, confusion.  Patient with unremarkable vitals.  No fever.  Patient was found walking in the streets in her underwear.  EMS was able to locate her home in which they were concerned about the poor condition of the home.  Patient appears to live by herself.  Neighbors told EMS that she may use drugs and has episodes of confusion like this.  EMS found blood sugar elevated above the level of limit on their monitor.  Patient with similar reading here.  Concern for hyperglycemic emergency include DKA, HHS.  Patient neurologically appears intact.  She is mildly confused.  No signs of trauma.  Upon chart review patient has had memory issues recently.  There is concerned about her home environment.  Lab work will be initiated including normal saline bolus, head CT.  Patient with blood sugar of 911 but CO2 25.  Anion gap is 13.  Blood gas unremarkable.  Overall patient appears to have HHS.  HHS protocol initiated with insulin infusion.  Mentation is stable.  Head CT was unremarkable.  Patient with potassium of 5.2.  Creatinine of 1.09.  Will admit for further insulin therapy.  Case management was consulted as patient likely unsafe to live at home.  This chart was dictated using voice  recognition software.  Despite best efforts to proofread,  errors can occur which can change the documentation  meaning.    Final Clinical Impression(s) / ED Diagnoses Final diagnoses:  Hyperosmolar hyperglycemic state (HHS) ([place])  Hyperglycemia    Rx / DC Orders ED Discharge Orders    None       Lennice Sites, DO 12/27/19 1857

## 2019-12-27 NOTE — H&P (Addendum)
Mary Swanson CVE:[id number] DOB: 05-25-1956 DOA: 12/27/2019     PCP: Mary Fee, DO   Outpatient Specialists:  NONE    Patient arrived to ER on 12/27/19 at 1609  Patient coming from: home Lives alone,      Chief Complaint:   Chief Complaint  Patient presents with  . Hyperglycemia  . Altered Mental Status    HPI: Mary Swanson is a 64 y.o. female with medical history significant of Dm 2 poorly controlled due to noncompliance, HLD, obesity, HTN    Presented with altered mental status patient was found today outside walking in her underwear nearly freezing temperatures.  EMS was able to assess her living conditions found to be very poor.  Per neighbors she may have history of drug abuse although she denies.  She was found to have high blood sugar by EMS patient herself denied any abdominal pain no fever no chills but altered and unable to provide detailed history.  recently has undergone work up for early onset dementia including head CT and MRI which was positive only for age-appropriate atrophy.  TSH B12 folate and glucose was all checked in the beginning of January. CCM has been involved in assessing living conditions there has been concerns that she lives in an unsafe and unhygienic environment. In the beginning of January patient already found to be hyperglycemic there was suspicion that she has not taken her medications.  She is supposed to be on 30 units of Lantus 3 units of Humalog with meals as well as NPH 10 units in the morning and 5 units at night and Metformin. Her last hemoglobin A1c was 9.5 indicative of poor control at home. Patient has no local family. She has been 6 showing signs of self-neglect not bathing not eating properly wearing the floor. Her neighbors have been somewhat involved and have been accompanying her to his medical appointments. Adult Protective Services report has been filled Infectious risk factors:  Altered unable to provide detailed  history    In  ER RAPID COVID TEST     PCR testing  Pending  No results found for: SARSCOV2NAA   Regarding pertinent Chronic problems:     Hyperlipidemia -  on statins supposed to be on Crestor   HTN on Cozaar       DM 2 -  Lab Results  Component Value Date   HGBA1C 9.9 (A) 10/01/2019   on insulin, PO meds               obesity-   BMI Readings from Last 1 Encounters:  12/03/19 31.31 kg/m        Asthma -well  controlled on home inhalers       While in ER:  Initial blood sugar found to be 911.  Potassium elevated 5.2 creatinine elevated 109 Patient started on insulin drip and IV fluids  CT head unremarkable no evidence of infectious process Lipase 19 alcohol less than 10 Blood gas showed no evidence of acidosis Normal anion gap UA without any evidence of infection  urine toxicity screen unremarkable  the following Work up has been ordered so far:  Orders Placed This Encounter  Procedures  . Critical Care  . Urine culture  . Respiratory Panel by RT PCR (Flu A&B, Covid) - Nasopharyngeal Swab  . CT Head Wo Contrast  . DG Chest Portable 1 View  . CBC with Differential  . Comprehensive metabolic panel  . Lipase, blood  . Urinalysis, Routine w reflex microscopic  .  Rapid urine drug screen (hospital performed)  . Ethanol  . Blood gas, venous (at [facility] and [facility], not at [facility])  . Beta-hydroxybutyric acid  . Osmolality  . Diet NPO time specified  . Cardiac monitoring  . Initiate Carrier Fluid Protocol  . Notify physician  . If present, discontinue Insulin Pump after IV Insulin is initiated.  . Do NOT use lab glucose values in [employer].  If CBG meter reads "Critical High", enter 600.  Marland Kitchen Upon IV fluid bolus completion, place order for STAT BMET (LAB15) and call provider with results.  . K+ > 5 mEq/L and/or K+ addressed separately  . Consult to Transition of Care Team (SW and CM)  . Consult to hospitalist  ALL PATIENTS BEING ADMITTED/HAVING PROCEDURES NEED COVID-19  SCREENING  . Pulse oximetry, continuous  . POC CBG, ED  . CBG monitoring, ED  . CBG monitoring, ED  . ED EKG  . Insert peripheral IV  . Insert peripheral IV    Following Medications were ordered in ER: Medications  insulin regular, human (MYXREDLIN) 100 units/ 100 mL infusion (6 Units/hr Intravenous New Bag/Given 12/27/19 1911)  0.9 %  sodium chloride infusion (has no administration in time range)  dextrose 5 %-0.45 % sodium chloride infusion (has no administration in time range)  dextrose 50 % solution 0-50 mL (has no administration in time range)  sodium chloride 0.9 % bolus 1,000 mL (1,000 mLs Intravenous New Bag/Given 12/27/19 1748)        Consult Orders  (From admission, onward)         Start     Ordered   12/27/19 1835  Consult to hospitalist  ALL PATIENTS BEING ADMITTED/HAVING PROCEDURES NEED COVID-19 SCREENING  Once    Comments: ALL PATIENTS BEING ADMITTED/HAVING PROCEDURES NEED COVID-19 SCREENING  Provider:  (Not yet assigned)  Question Answer Comment  Place call to: [facility]   Reason for Consult Admit      12/27/19 1834          Significant initial  Findings: Abnormal Labs Reviewed  CBC WITH DIFFERENTIAL/PLATELET - Abnormal; Notable for the following components:      Result Value   Neutro Abs 8.5 (*)    Lymphs Abs 0.6 (*)    All other components within normal limits  COMPREHENSIVE METABOLIC PANEL - Abnormal; Notable for the following components:   Potassium 5.2 (*)    Glucose, Bld 911 (*)    Creatinine, Ser 1.09 (*)    AST 52 (*)    GFR calc non Af Amer 53 (*)    All other components within normal limits  URINALYSIS, ROUTINE W REFLEX MICROSCOPIC - Abnormal; Notable for the following components:   Color, Urine STRAW (*)    Specific Gravity, Urine 1.031 (*)    Glucose, UA >=500 (*)    Hgb urine dipstick SMALL (*)    Bacteria, UA RARE (*)    All other components within normal limits  BLOOD GAS, VENOUS - Abnormal; Notable for the following  components:   pO2, Ven <31.0 (*)    Bicarbonate 29.4 (*)    Acid-Base Excess 2.7 (*)    All other components within normal limits  CBG MONITORING, ED - Abnormal; Notable for the following components:   Glucose-Capillary >600 (*)    All other components within normal limits  CBG MONITORING, ED - Abnormal; Notable for the following components:   Glucose-Capillary >600 (*)    All other components within normal limits     Otherwise  labs showing:    Recent Labs  Lab 12/27/19 1720  NA 140  K 5.2*  CO2 25  GLUCOSE 911*  BUN 19  CREATININE 1.09*  CALCIUM 10.3    Cr   Up from baseline see below Lab Results  Component Value Date   CREATININE 1.09 (H) 12/27/2019   CREATININE 0.66 07/27/2019   CREATININE 0.74 12/27/2018    Recent Labs  Lab 12/27/19 1720  AST 52*  ALT 28  ALKPHOS 105  BILITOT 0.9  PROT 8.1  ALBUMIN 4.3   Lab Results  Component Value Date   CALCIUM 10.3 12/27/2019      WBC      Component Value Date/Time   WBC 9.5 12/27/2019 1720   ANC    Component Value Date/Time   NEUTROABS 8.5 (H) 12/27/2019 1720   ALC No components found for: LYMPHAB    Plt: Lab Results  Component Value Date   PLT 321 12/27/2019        COVID-19 Labs  No results for input(s): DDIMER, FERRITIN, LDH, CRP in the last 72 hours.  No results found for: SARSCOV2NAA  Venous  Blood Gas result:  pH 7.33 pCO2 56.9;    ABG    Component Value Date/Time   HCO3 29.4 (H) 12/27/2019 1720   O2SAT 46.7 12/27/2019 1720      HG/HCT  Stable     Component Value Date/Time   HGB 13.1 12/27/2019 1720   HGB 14.8 05/11/2017 1639   HCT 42.6 12/27/2019 1720   HCT 44.2 05/11/2017 1639    Recent Labs  Lab 12/27/19 1720  LIPASE 19   No results for input(s): AMMONIA in the last 168 hours.  No components found for: LABALBU   Troponin  ordered   ECG: Ordered Personally reviewed by me showing: HR : 105 Rhythm:  Sinus tachycardia    no evidence of ischemic changes QTC  448   DM  labs:  HbA1C: Recent Labs    10/01/19 0926  HGBA1C 9.9*     CBG (last 3)  Recent Labs    12/27/19 1635 12/27/19 1851  GLUCAP >600* >600*       UA no evidence of UTI    Urine analysis:    Component Value Date/Time   COLORURINE STRAW (A) 12/27/2019 1635   APPEARANCEUR CLEAR 12/27/2019 1635   LABSPEC 1.031 (H) 12/27/2019 1635   PHURINE 5.0 12/27/2019 1635   GLUCOSEU >=500 (A) 12/27/2019 1635   HGBUR SMALL (A) 12/27/2019 1635   BILIRUBINUR NEGATIVE 12/27/2019 1635   KETONESUR NEGATIVE 12/27/2019 1635   PROTEINUR NEGATIVE 12/27/2019 1635   NITRITE NEGATIVE 12/27/2019 1635   LEUKOCYTESUR NEGATIVE 12/27/2019 1635       Ordered  CT HEAD  NON acute  CXR -questionable retrocardiac opacity atelectasis and or infiltrate      ED Triage Vitals [12/27/19 1642]  Enc Vitals Group     BP (!) 172/99     Pulse Rate (!) 104     Resp 13     Temp 97.6 F (36.4 C)     Temp Source Oral     SpO2 97 %     Weight      Height      Head Circumference      Peak Flow      Pain Score      Pain Loc      Pain Edu?      Excl. in [place]?   KKXF(81)@  Latest  Blood pressure 137/77, pulse 98, temperature 97.6 F (36.4 C), temperature source Oral, resp. rate 18, SpO2 100 %.     Hospitalist was called for admission for hyperglycemic hyperosmolar state   Review of Systems:    Pertinent positives include: Confusion  Constitutional:  No weight loss, night sweats, Fevers, chills, fatigue, weight loss  HEENT:  No headaches, Difficulty swallowing,Tooth/dental problems,Sore throat,  No sneezing, itching, ear ache, nasal congestion, post nasal drip,  Cardio-vascular:  No chest pain, Orthopnea, PND, anasarca, dizziness, palpitations.no Bilateral lower extremity swelling  GI:  No heartburn, indigestion, abdominal pain, nausea, vomiting, diarrhea, change in bowel habits, loss of appetite, melena, blood in stool, hematemesis Resp:  no shortness of breath at rest. No dyspnea  on exertion, No excess mucus, no productive cough, No non-productive cough, No coughing up of blood.No change in color of mucus.No wheezing. Skin:  no rash or lesions. No jaundice GU:  no dysuria, change in color of urine, no urgency or frequency. No straining to urinate.  No flank pain.  Musculoskeletal:  No joint pain or no joint swelling. No decreased range of motion. No back pain.  Psych:  No change in mood or affect. No depression or anxiety. No memory loss.  Neuro: no localizing neurological complaints, no tingling, no weakness, no double vision, no gait abnormality, no slurred speech,   All systems reviewed and apart from [place] all are negative  Past Medical History:   Past Medical History:  Diagnosis Date  . Diabetes mellitus without complication ([place])   . Hypertension       Past Surgical History:  Procedure Laterality Date  . CATARACT EXTRACTION, BILATERAL Bilateral     Social History:  Ambulatory independently      reports that she has never smoked. She has never used smokeless tobacco. She reports previous alcohol use. She reports that she does not use drugs.     Family History:   Family History  Problem Relation Age of Onset  . Diabetes Neg Hx     Allergies: Allergies  Allergen Reactions  . Erythromycin Diarrhea  . Latex Hives  . Lisinopril Cough     Prior to Admission medications   Medication Sig Start Date End Date Taking? Authorizing Provider  AgaMatrix Ultra-Thin Lancets MISC Check blood glucose 3 times daily before meals and as needed 06/25/19   Mack Hook, MD  aspirin 81 MG chewable tablet Chew 81 mg by mouth daily.     [provider]  Blood Glucose Monitoring Suppl (AGAMATRIX PRESTO) w/Device KIT Check blood glucose three times daily before meals and as needed 06/25/19   Mack Hook, MD  Ferrous Sulfate (SLOW FE) 142 (45 Fe) MG TBCR Take 1 tablet by mouth daily.    [provider]  glucose 4 GM chewable  tablet Chew 1 tablet by mouth as needed for low blood sugar.    [provider]  glucose blood (AGAMATRIX PRESTO TEST) test strip Check blood glucose 3 times daily before meals and as needed 06/25/19   Mack Hook, MD  Insulin Glargine (LANTUS SOLOSTAR) 100 UNIT/ML Solostar Pen 30 units injected subcutaneously once daily in morning before breakfast Patient taking differently: Inject 10 Units into the skin 3 (three) times daily. 30 units injected subcutaneously once daily in morning before breakfast 06/25/19   Mack Hook, MD  insulin lispro (HUMALOG KWIKPEN) 100 UNIT/ML KwikPen 3 units injected subcutaneously 10 minutes before meals 3 times daily Patient not taking: Reported on 07/27/2019 06/25/19  Mack Hook, MD  insulin NPH Human (NOVOLIN N) 100 UNIT/ML injection 10 units injected Subcutaneously in AM and 5 units injected in PM before meals Patient taking differently: Inject 6-10 Units into the skin 2 (two) times daily before a meal. 10 units injected Subcutaneously in AM and 5 units injected in PM before meals 06/20/19   Mack Hook, MD  Insulin Pen Needle (BD PEN NEEDLE NANO U/F) 32G X 4 MM MISC 1 Stick by Does not apply route every morning. 06/05/17   Eloise Levels, MD  losartan (COZAAR) 100 MG tablet 1 tab by mouth daily. Patient taking differently: Take 100 mg by mouth daily. 1 tab by mouth daily. 06/25/19   Mack Hook, MD  metFORMIN (GLUCOPHAGE-XR) 500 MG 24 hr tablet 1 tab by mouth twice daily with meals Patient taking differently: Take 500 mg by mouth 2 (two) times daily. 1 tab by mouth twice daily with meals 06/20/19   Mack Hook, MD  NOVOLIN R RELION 100 UNIT/ML injection INJECT 3 UNITS INTO THE SKIN 3 TIMES DAILY BEFORE MEALS. CHECK BLOOD SUGAR PRIOR TO EACH MEAL. Patient not taking: Reported on 07/27/2019 06/20/19   Mack Hook, MD  rosuvastatin (CRESTOR) 20 MG tablet Take 1 tablet (20 mg total) by mouth daily. 10/02/19    Autry-Lott, Naaman Plummer, DO   Physical Exam: Blood pressure 137/77, pulse 98, temperature 97.6 F (36.4 C), temperature source Oral, resp. rate 18, SpO2 100 %. 1. General:  in No  Acute distress    Chronically ill -appearing 2. Psychological: Alert and  Oriented to self only not to situation 3. Head/ENT:    Dry Mucous Membranes                          Head Non traumatic, neck supple                           Poor Dentition 4. SKIN:   decreased Skin turgor,  Skin clean Dry and intact no rash 5. Heart: Regular rate and rhythm no Murmur, no Rub or gallop 6. Lungs:  no wheezes or crackles   7. Abdomen: Soft,  non-tender, Non distended  Obese  8. Lower extremities: no clubbing, cyanosis, no  edema 9. Neurologically Grossly intact, moving all 4 extremities equally  10. MSK: Normal range of motion   All other LABS:     Recent Labs  Lab 12/27/19 1720  WBC 9.5  NEUTROABS 8.5*  HGB 13.1  HCT 42.6  MCV 94.7  PLT 321     Recent Labs  Lab 12/27/19 1720  NA 140  K 5.2*  CL 102  CO2 25  GLUCOSE 911*  BUN 19  CREATININE 1.09*  CALCIUM 10.3     Recent Labs  Lab 12/27/19 1720  AST 52*  ALT 28  ALKPHOS 105  BILITOT 0.9  PROT 8.1  ALBUMIN 4.3       Cultures: No results found for: SDES, SPECREQUEST, CULT, REPTSTATUS   Radiological Exams on Admission: CT Head Wo Contrast  Result Date: 12/27/2019 CLINICAL DATA:  Altered mental status EXAM: CT HEAD WITHOUT CONTRAST TECHNIQUE: Contiguous axial images were obtained from the base of the skull through the vertex without intravenous contrast. COMPARISON:  07/27/2019 FINDINGS: Brain: Mildly advanced cerebral atrophy. This results in mild prominence of the the lateral ventricles, similar. No mass lesion, acute infarct, intra-axial, or extra-axial fluid collection. Vascular: No hyperdense vessel or unexpected calcification.  Skull: Normal skull. Sinuses/Orbits: Normal imaged portions of the orbits and globes. Minimal fluid or mucosal  thickening in the right maxillary sinus. Clear mastoid air cells. Other: None. IMPRESSION: 1.  No acute intracranial abnormality. 2. Cerebral atrophy Electronically Signed   By: Abigail Miyamoto M.D.   On: 12/27/2019 17:55   DG Chest Portable 1 View  Result Date: 12/27/2019 CLINICAL DATA:  Altered mental status EXAM: PORTABLE CHEST 1 VIEW COMPARISON:  None. FINDINGS: The heart size is normal. There is no pneumothorax or large pleural effusion. There is a possible retrocardiac opacity that is somewhat suboptimally evaluated secondary to an overlapping breast implant. Aortic calcifications are noted. There are peripherally calcified breast implants. IMPRESSION: Questionable retrocardiac opacity which may represent atelectasis or infiltrate. An overlapping breast implant limits evaluation of this finding. Electronically Signed   By: Constance Holster M.D.   On: 12/27/2019 17:43    Chart has been reviewed    Assessment/Plan   64 y.o. female with medical history significant of Dm 2 poorly controlled due to noncompliance, HLD, obesity, HTN  Admitted for hyperglycemic hyperosmolar state  Present on Admission: . Hyperosmolar hyperglycemic state (HHS) [facility]) - will admit per HHS protocol, obtain serial BMET, start on glucosestabalizer,   IVF.  Most likely cause for hyperglycemia is noncompliance  So far work-up with CXR, ECG one set of cardiac enzymes, UA.  Has been unremarkable Monitor in Stepdown. Replace potassium as needed.    . Hyperlipidemia -   stable continue home medications   . AKI (acute kidney injury) ([place]) -likely secondary to dehydration will rehydrate and follow order urine electrolytes . Dehydration -we will rehydrate . Type 2 diabetes mellitus with hyperglycemia (HCC) -will need diabetic and care coordinator consult  Abnormal Chest x-ray -no clinical evidence of pneumonia most likely atelectasis but will repeat in a.m. to further evaluate  Acute encephalopathy -in the setting of  hyperosmolar state.  Patient may have underlying dementia.  Recently have undergone outpatient work-up with her so far been unremarkable. Expect some degree of sundowning while hospitalized. Urine toxicity screen unremarkable Alcohol level unremarkable  May need placement In the past social worker has been involved secondary to the need for Adult Protective Services Reconsult social work Other plan as per orders. Check further electrolytes in initiate thiamin as patient likely at risk for malnutrition    essential hypertension hold Cozaar given AKI  Rhabdomyolysis  - in a setting of dehydration, will rehydrate and follow CK  DVT prophylaxis:   Lovenox     Code Status:  FULL CODE  as per patient  I had personally discussed CODE STATUS with patient    Family Communication:   Family not at  Bedside    Disposition Plan:   likely will need placement for rehabilitation                                              Would benefit from PT/OT eval prior to DC  Ordered                   Swallow eval - SLP ordered                   Social Work  consulted                   Nutrition    consulted  Consults called: none  Admission status:  ED Disposition    ED Disposition Condition [place]: [place] [100102]  Level of Care: Stepdown [14]  Admit to SDU based on following criteria: Other see comments  Comments: hyperglycemia  Covid Evaluation: Symptomatic Person Under Investigation (PUI)  Diagnosis: Hyperosmolar hyperglycemic state (HHS) [facility]) [[id number]]  Admitting Physician: Toy Baker [[id number]]  Attending Physician: Toy Baker [[id number]]       Obs     Level of care   SDU tele indefinitely please discontinue once patient no longer qualifies   Precautions: admitted as  PUI      If Covid PCR is negative  - please DC precautions    PPE: Used by the provider:   P100  eye Goggles,  Gloves     Arleigh Odowd 12/27/2019, 8:15 PM    [facility]     after 2 AM please page floor coverage PA If 7AM-7PM, please contact the day team taking care of the patient using [email or web address]   Patient was evaluated in the context of the global COVID-19 pandemic, which necessitated consideration that the patient might be at risk for infection with the SARS-CoV-2 virus that causes COVID-19. Institutional protocols and algorithms that pertain to the evaluation of patients at risk for COVID-19 are in a state of rapid change based on information released by regulatory bodies including the CDC and federal and state organizations. These policies and algorithms were followed during the patient's care.

## 2019-12-28 ENCOUNTER — Observation Stay (HOSPITAL_COMMUNITY): Payer: Medicare Other

## 2019-12-28 ENCOUNTER — Other Ambulatory Visit: Payer: Self-pay

## 2019-12-28 DIAGNOSIS — F05 Delirium due to known physiological condition: Secondary | ICD-10-CM | POA: Diagnosis present

## 2019-12-28 DIAGNOSIS — F0390 Unspecified dementia without behavioral disturbance: Secondary | ICD-10-CM | POA: Diagnosis present

## 2019-12-28 DIAGNOSIS — E860 Dehydration: Secondary | ICD-10-CM | POA: Diagnosis present

## 2019-12-28 DIAGNOSIS — Z9119 Patient's noncompliance with other medical treatment and regimen: Secondary | ICD-10-CM | POA: Diagnosis not present

## 2019-12-28 DIAGNOSIS — E11649 Type 2 diabetes mellitus with hypoglycemia without coma: Secondary | ICD-10-CM | POA: Diagnosis not present

## 2019-12-28 DIAGNOSIS — E119 Type 2 diabetes mellitus without complications: Secondary | ICD-10-CM

## 2019-12-28 DIAGNOSIS — G9341 Metabolic encephalopathy: Secondary | ICD-10-CM | POA: Diagnosis present

## 2019-12-28 DIAGNOSIS — N179 Acute kidney failure, unspecified: Secondary | ICD-10-CM | POA: Diagnosis present

## 2019-12-28 DIAGNOSIS — E1100 Type 2 diabetes mellitus with hyperosmolarity without nonketotic hyperglycemic-hyperosmolar coma (NKHHC): Secondary | ICD-10-CM | POA: Diagnosis present

## 2019-12-28 DIAGNOSIS — Z20822 Contact with and (suspected) exposure to covid-19: Secondary | ICD-10-CM | POA: Diagnosis present

## 2019-12-28 DIAGNOSIS — R739 Hyperglycemia, unspecified: Secondary | ICD-10-CM | POA: Diagnosis present

## 2019-12-28 DIAGNOSIS — E1165 Type 2 diabetes mellitus with hyperglycemia: Secondary | ICD-10-CM | POA: Diagnosis not present

## 2019-12-28 DIAGNOSIS — G9340 Encephalopathy, unspecified: Secondary | ICD-10-CM | POA: Diagnosis not present

## 2019-12-28 DIAGNOSIS — Z6832 Body mass index (BMI) 32.0-32.9, adult: Secondary | ICD-10-CM | POA: Diagnosis not present

## 2019-12-28 DIAGNOSIS — Z794 Long term (current) use of insulin: Secondary | ICD-10-CM | POA: Diagnosis not present

## 2019-12-28 DIAGNOSIS — I10 Essential (primary) hypertension: Secondary | ICD-10-CM | POA: Diagnosis present

## 2019-12-28 DIAGNOSIS — E785 Hyperlipidemia, unspecified: Secondary | ICD-10-CM | POA: Diagnosis present

## 2019-12-28 DIAGNOSIS — E870 Hyperosmolality and hypernatremia: Secondary | ICD-10-CM | POA: Diagnosis present

## 2019-12-28 DIAGNOSIS — E669 Obesity, unspecified: Secondary | ICD-10-CM | POA: Diagnosis present

## 2019-12-28 DIAGNOSIS — R9389 Abnormal findings on diagnostic imaging of other specified body structures: Secondary | ICD-10-CM | POA: Diagnosis not present

## 2019-12-28 DIAGNOSIS — M6282 Rhabdomyolysis: Secondary | ICD-10-CM | POA: Diagnosis present

## 2019-12-28 HISTORY — DX: Metabolic encephalopathy: G93.41

## 2019-12-28 HISTORY — DX: Type 2 diabetes mellitus without complications: E11.9

## 2019-12-28 LAB — PHOSPHORUS: Phosphorus: 3.5 mg/dL (ref 2.5–4.6)

## 2019-12-28 LAB — BASIC METABOLIC PANEL
Anion gap: 8 (ref 5–15)
Anion gap: 8 (ref 5–15)
Anion gap: 8 (ref 5–15)
BUN: 15 mg/dL (ref 8–23)
BUN: 14 mg/dL (ref 8–23)
BUN: 12 mg/dL (ref 8–23)
CO2: 28 mmol/L (ref 22–32)
CO2: 28 mmol/L (ref 22–32)
CO2: 27 mmol/L (ref 22–32)
Calcium: 9.7 mg/dL (ref 8.9–10.3)
Calcium: 9 mg/dL (ref 8.9–10.3)
Calcium: 9.5 mg/dL (ref 8.9–10.3)
Chloride: 116 mmol/L — ABNORMAL HIGH (ref 98–111)
Chloride: 115 mmol/L — ABNORMAL HIGH (ref 98–111)
Chloride: 114 mmol/L — ABNORMAL HIGH (ref 98–111)
Creatinine, Ser: 0.64 mg/dL (ref 0.44–1.00)
Creatinine, Ser: 0.58 mg/dL (ref 0.44–1.00)
Creatinine, Ser: 0.78 mg/dL (ref 0.44–1.00)
GFR calc Af Amer: >60 mL/min (ref >60)
GFR calc Af Amer: >60 mL/min (ref >60)
GFR calc Af Amer: >60 mL/min (ref >60)
GFR calc non Af Amer: >60 mL/min (ref >60)
GFR calc non Af Amer: >60 mL/min (ref >60)
GFR calc non Af Amer: >60 mL/min (ref >60)
Glucose, Bld: 131 mg/dL — ABNORMAL HIGH (ref 70–99)
Glucose, Bld: 225 mg/dL — ABNORMAL HIGH (ref 70–99)
Glucose, Bld: 74 mg/dL (ref 70–99)
Potassium: 3.3 mmol/L — ABNORMAL LOW (ref 3.5–5.1)
Potassium: 3.2 mmol/L — ABNORMAL LOW (ref 3.5–5.1)
Potassium: 3.6 mmol/L (ref 3.5–5.1)
Sodium: 152 mmol/L — ABNORMAL HIGH (ref 135–145)
Sodium: 151 mmol/L — ABNORMAL HIGH (ref 135–145)
Sodium: 149 mmol/L — ABNORMAL HIGH (ref 135–145)

## 2019-12-28 LAB — PREALBUMIN: Prealbumin: 14.7 mg/dL — ABNORMAL LOW (ref 18–38)

## 2019-12-28 LAB — GLUCOSE, CAPILLARY
Glucose-Capillary: 124 mg/dL — ABNORMAL HIGH (ref 70–99)
Glucose-Capillary: 150 mg/dL — ABNORMAL HIGH (ref 70–99)
Glucose-Capillary: 154 mg/dL — ABNORMAL HIGH (ref 70–99)
Glucose-Capillary: 161 mg/dL — ABNORMAL HIGH (ref 70–99)
Glucose-Capillary: 182 mg/dL — ABNORMAL HIGH (ref 70–99)
Glucose-Capillary: 226 mg/dL — ABNORMAL HIGH (ref 70–99)
Glucose-Capillary: 231 mg/dL — ABNORMAL HIGH (ref 70–99)
Glucose-Capillary: 272 mg/dL — ABNORMAL HIGH (ref 70–99)
Glucose-Capillary: 280 mg/dL — ABNORMAL HIGH (ref 70–99)
Glucose-Capillary: 62 mg/dL — ABNORMAL LOW (ref 70–99)
Glucose-Capillary: 75 mg/dL (ref 70–99)

## 2019-12-28 LAB — SODIUM, URINE, RANDOM: Sodium, Ur: 25 mmol/L

## 2019-12-28 LAB — HEMOGLOBIN A1C
Hgb A1c MFr Bld: 11.9 % — ABNORMAL HIGH (ref 4.8–5.6)
Mean Plasma Glucose: 294.83 mg/dL

## 2019-12-28 LAB — VITAMIN B12: Vitamin B-12: 307 pg/mL (ref 180–914)

## 2019-12-28 LAB — AMMONIA: Ammonia: 25 umol/L (ref 9–35)

## 2019-12-28 LAB — MAGNESIUM: Magnesium: 2 mg/dL (ref 1.7–2.4)

## 2019-12-28 LAB — CREATININE, URINE, RANDOM: Creatinine, Urine: 16.73 mg/dL

## 2019-12-28 LAB — HIV ANTIBODY (ROUTINE TESTING W REFLEX): HIV Screen 4th Generation wRfx: NONREACTIVE

## 2019-12-28 LAB — OSMOLALITY: Osmolality: 327 mOsm/kg (ref 275–295)

## 2019-12-28 LAB — TSH: TSH: 1.031 u[IU]/mL (ref 0.350–4.500)

## 2019-12-28 LAB — MRSA PCR SCREENING: MRSA by PCR: POSITIVE — AB

## 2019-12-28 LAB — TROPONIN I (HIGH SENSITIVITY): Troponin I (High Sensitivity): 7 ng/L (ref <18)

## 2019-12-28 LAB — CK: Total CK: 782 U/L — ABNORMAL HIGH (ref 38–234)

## 2019-12-28 LAB — FOLATE: Folate: 9.6 ng/mL (ref >5.9)

## 2019-12-28 IMAGING — MR MR HEAD W/O CM
10 series · 41 of 48 positions shown · non-contrast
Comparison: Head CT yesterday.

CLINICAL DATA: Encephalopathy. Hypertension and diabetes. Altered
mental status.

EXAM:
MRI HEAD WITHOUT CONTRAST
TECHNIQUE: Multiplanar, multiecho pulse sequences of the brain and surrounding
structures were obtained without intravenous contrast.

[Series 3: T1 · sagittal · 5.0mm · 0.47mm/px · 1 of 19 slices shown (1 of 2)]
[im 1/19]
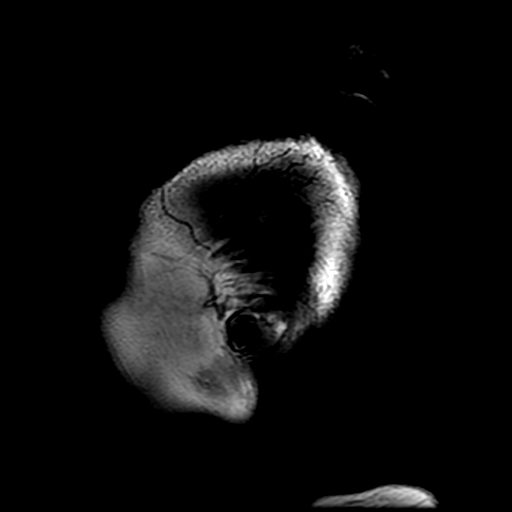

[Series 4: DWI · axial · 4.0mm · 1.17mm/px · z∈[-69,+75]mm · 7 of 66 slices shown (1 of 4)]
[im 1/66]
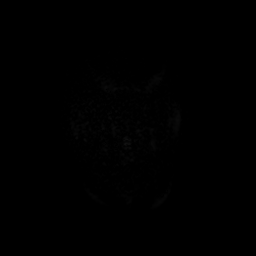
[im 11/66]
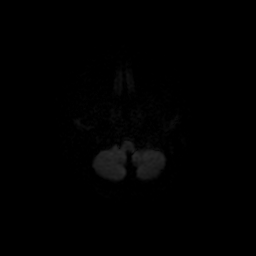
[im 22/66]
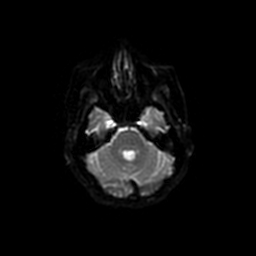
[im 33/66]
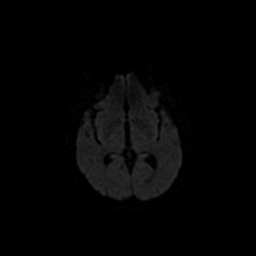
[im 44/66]
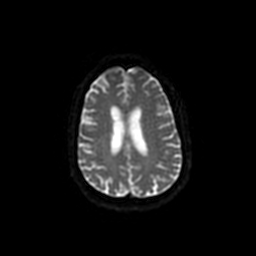
[im 55/66]
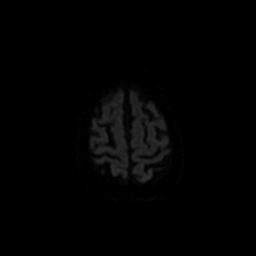
[im 66/66]
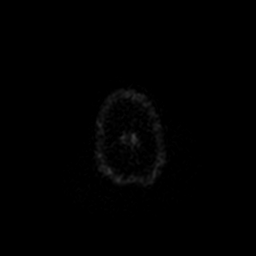

[Series 5: T2 · axial · 5.0mm · 0.86mm/px · z∈[-64,+79]mm · 3 of 25 slices shown (1 of 2)]
[im 1/25]
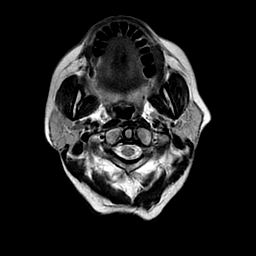
[im 13/25]
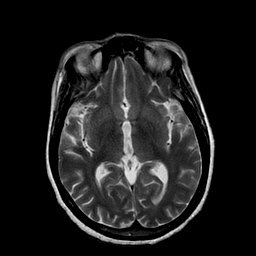
[im 25/25]
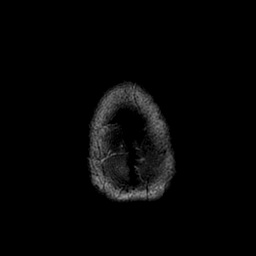

[Series 6: FLAIR · axial · 3.0mm · 0.86mm/px · z∈[-64,+79]mm · 3 of 25 slices shown]
[im 1/25]
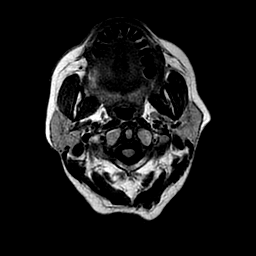
[im 13/25]
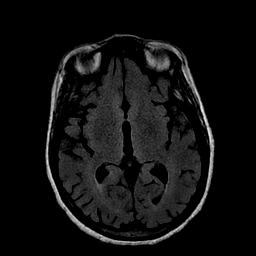
[im 25/25]
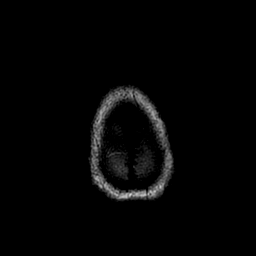

[Series 7: T2-star · axial · 3.0mm · 0.43mm/px · 1 of 28 slices shown]
[im 1/28]
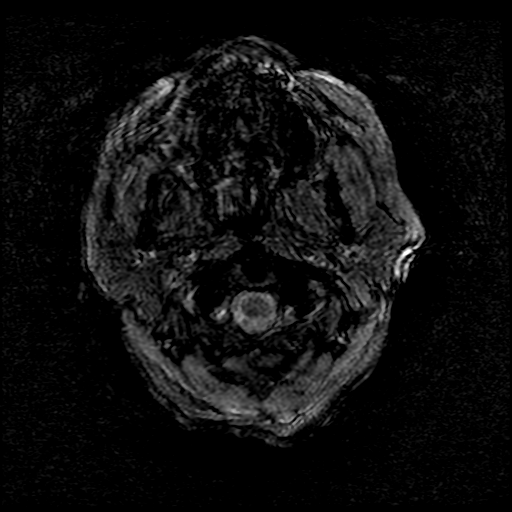

[Series 8: T1 · axial · 1.0mm · 0.47mm/px · z∈[-56,+74]mm · 8 of 132 slices shown (2 of 2)]
[im 1/132]
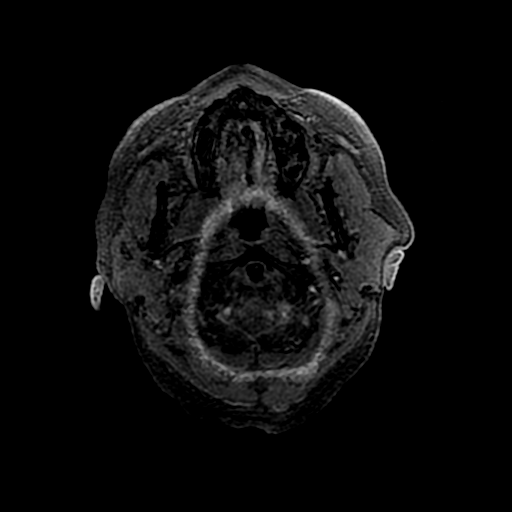
[im 22/132]
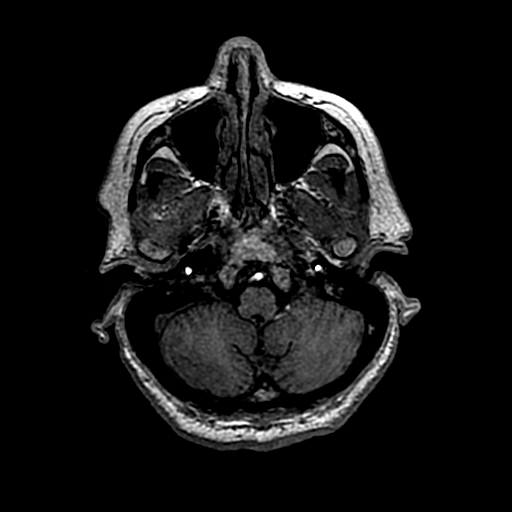
[im 44/132]
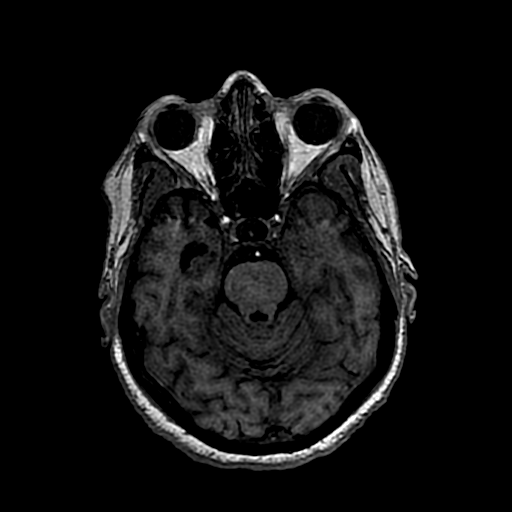
[im 55/132]
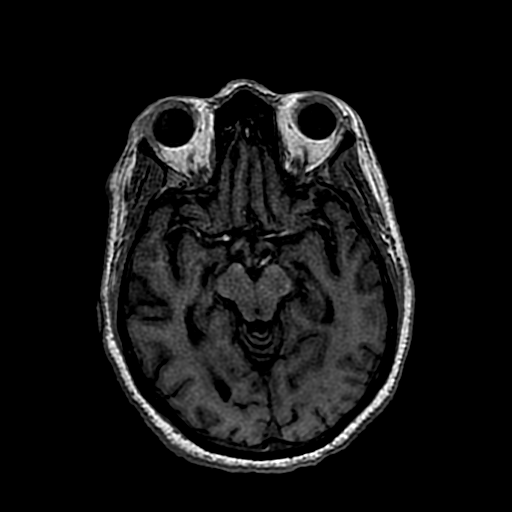
[im 77/132]
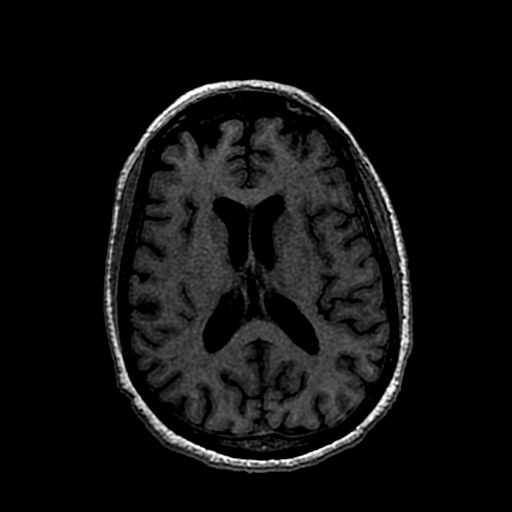
[im 88/132]
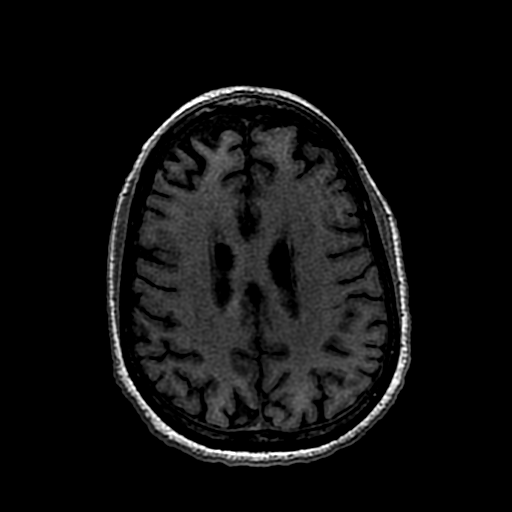
[im 110/132]
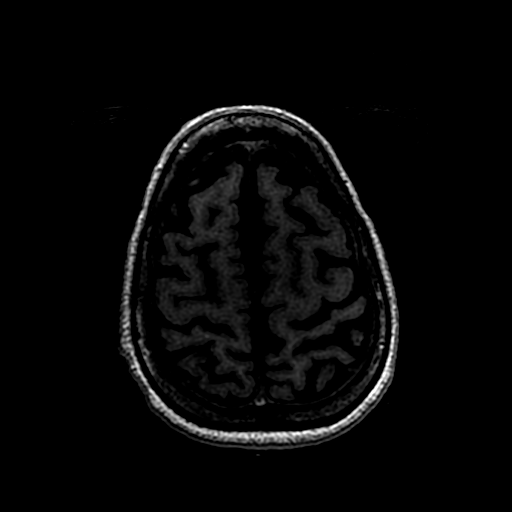
[im 132/132]
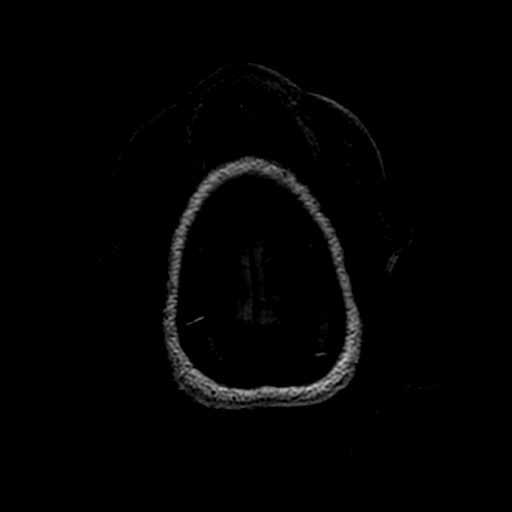

[Series 9: DWI · coronal · 4.0mm · 1.09mm/px · 8 of 82 slices shown (2 of 4)]
[im 1/82]
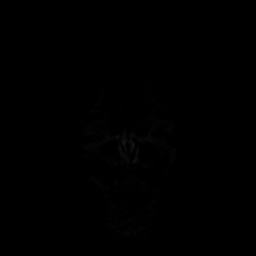
[im 12/82]
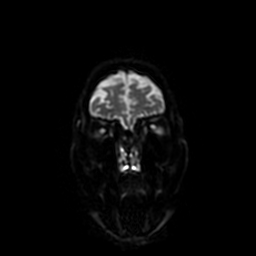
[im 24/82]
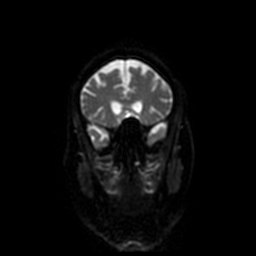
[im 35/82]
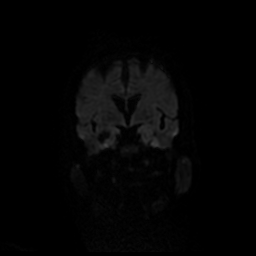
[im 47/82]
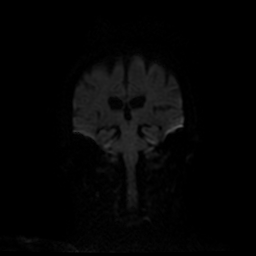
[im 58/82]
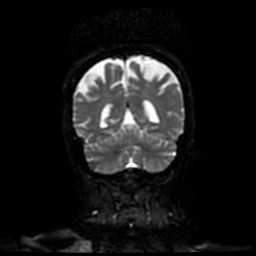
[im 70/82]
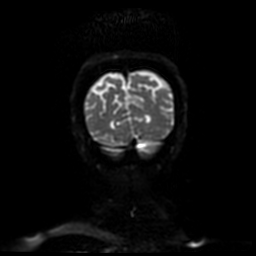
[im 82/82]
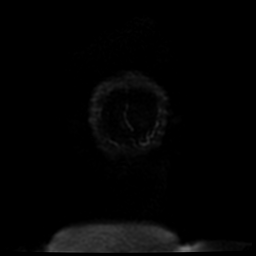

[Series 10: T2 · coronal · 5.0mm · 0.90mm/px · 3 of 26 slices shown (2 of 2)]
[im 1/26]
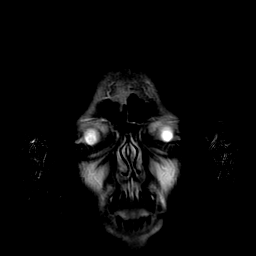
[im 13/26]
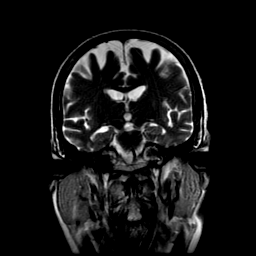
[im 26/26]
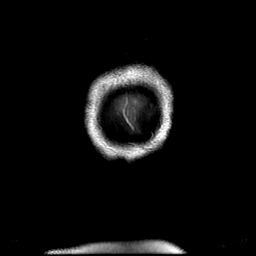

[Series 400: DWI · axial · 4.0mm · 1.17mm/px · z∈[-69,+75]mm · 3 of 33 slices shown (3 of 4)]
[im 1/33]
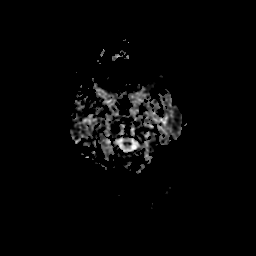
[im 17/33]
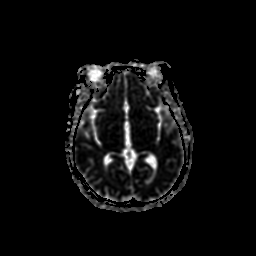
[im 33/33]
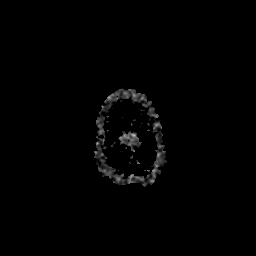

[Series 900: DWI · coronal · 4.0mm · 1.09mm/px · 4 of 41 slices shown (4 of 4)]
[im 1/41]
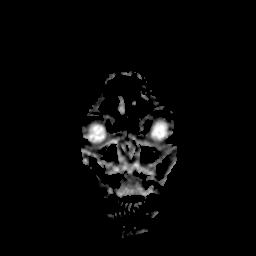
[im 14/41]
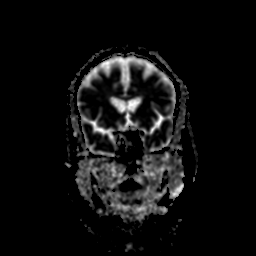
[im 27/41]
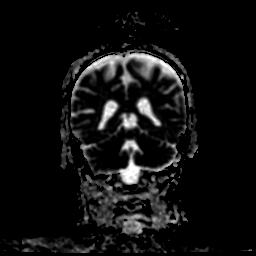
[im 41/41]
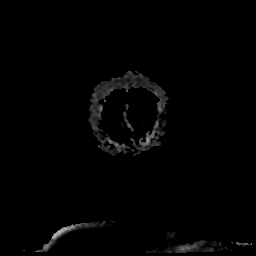

[41 of 48 positions shown; findings below may reference images not displayed]

FINDINGS: Brain: Diffusion imaging does not show any acute or subacute
infarction. The brainstem and cerebellum are normal. Cerebral
hemispheres are normal for age. No small or large vessel infarction.
No mass lesion, hemorrhage, hydrocephalus or extra-axial collection.

Vascular: Major vessels at the base of the brain show flow.

Skull and upper cervical spine: Negative

Sinuses/Orbits: Clear/normal

Other: None
IMPRESSION: Normal exam for age.

## 2019-12-28 IMAGING — DX DG CHEST 2V
2 series · 2 of 2 positions shown · non-contrast
Comparison: [DATE]

CLINICAL DATA: Altered mental status.

EXAM:
CHEST - 2 VIEW

[chest pa]
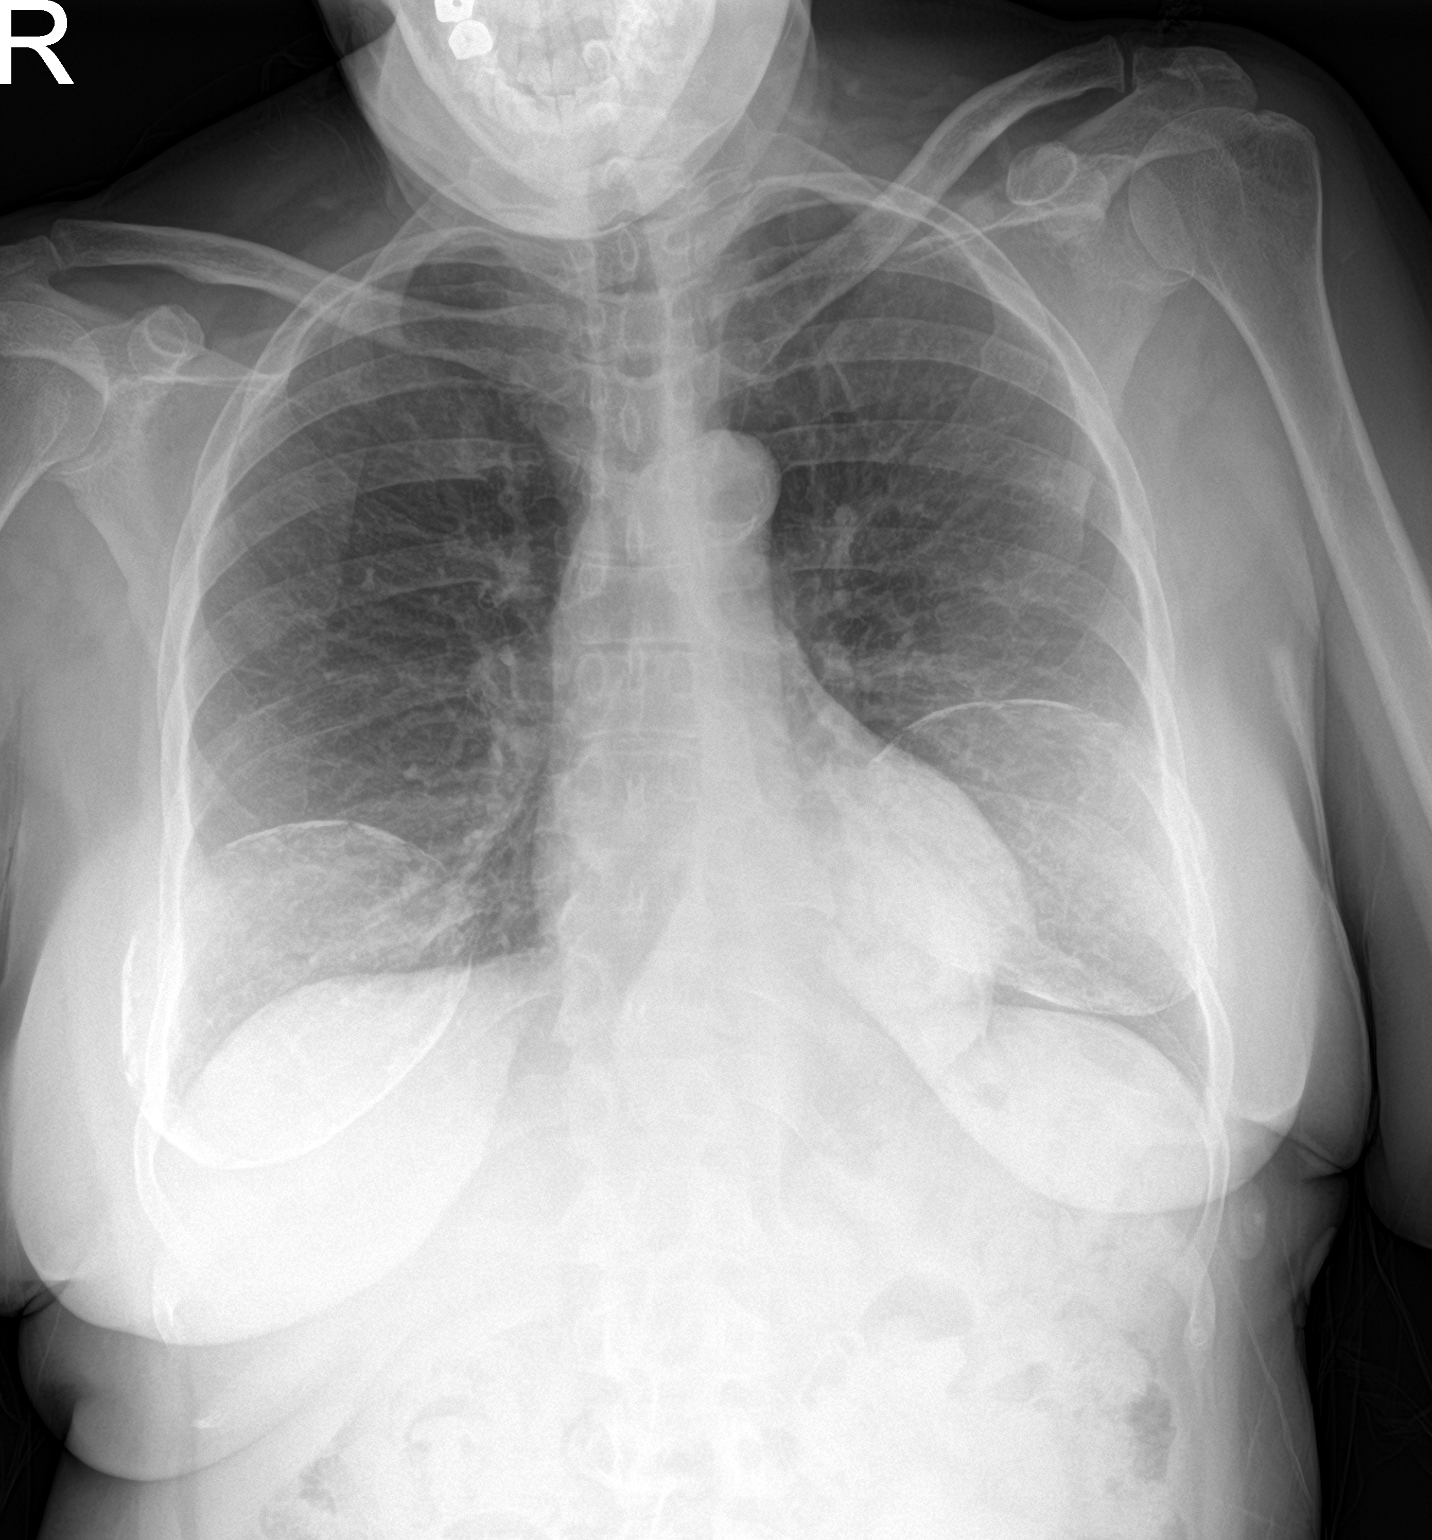

[chest lat]
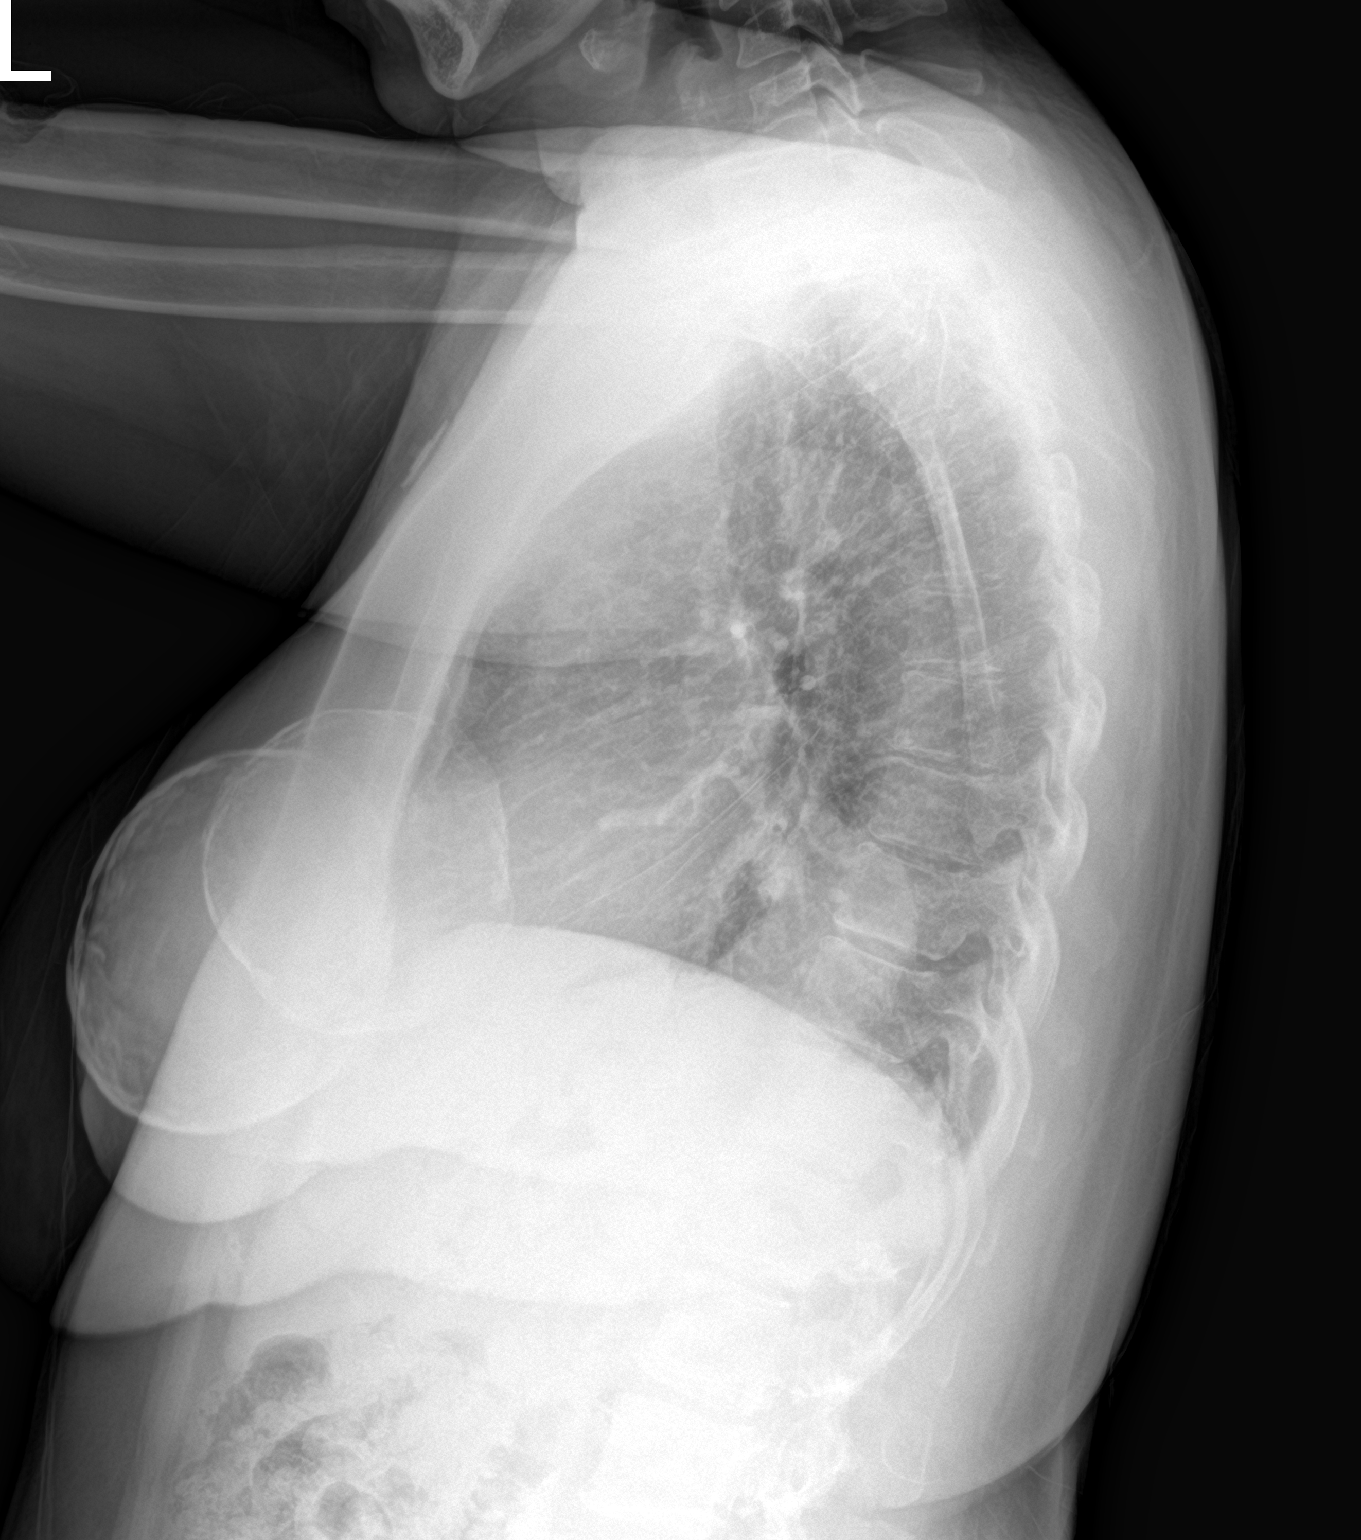

[2 of 2 positions shown; findings below may reference images not displayed]

FINDINGS: Lungs are adequately inflated without focal airspace consolidation
or effusion. Cardiomediastinal silhouette is within normal. Evidence
of moderate size hiatal hernia. Remainder the exam is unchanged.
IMPRESSION: 1.  No acute cardiopulmonary disease.

2.  Moderate size hiatal hernia.

## 2019-12-28 MED ORDER — INSULIN ASPART 100 UNIT/ML ~~LOC~~ SOLN
5.0000 [IU] | Freq: Three times a day (TID) | SUBCUTANEOUS | Status: DC
  Administered 2019-12-28: 5 [IU] via SUBCUTANEOUS

## 2019-12-28 MED ORDER — INSULIN GLARGINE 100 UNIT/ML ~~LOC~~ SOLN
20.0000 [IU] | Freq: Every day | SUBCUTANEOUS | Status: DC
  Administered 2019-12-28: 20 [IU] via SUBCUTANEOUS
  Filled 2019-12-28: qty 0.2

## 2019-12-28 MED ORDER — THIAMINE HCL 100 MG PO TABS
100.0000 mg | ORAL_TABLET | Freq: Every day | ORAL | Status: DC
  Administered 2019-12-28 – 2019-12-31 (×4): 100 mg via ORAL
  Filled 2019-12-28 (×4): qty 1

## 2019-12-28 MED ORDER — MUPIROCIN 2 % EX OINT
TOPICAL_OINTMENT | Freq: Two times a day (BID) | CUTANEOUS | Status: DC
  Administered 2019-12-30: 1 via NASAL
  Filled 2019-12-28 (×4): qty 22

## 2019-12-28 MED ORDER — AMLODIPINE BESYLATE 5 MG PO TABS
5.0000 mg | ORAL_TABLET | Freq: Every day | ORAL | Status: DC
  Administered 2019-12-28 – 2019-12-29 (×2): 5 mg via ORAL
  Filled 2019-12-28 (×2): qty 1

## 2019-12-28 MED ORDER — CHLORHEXIDINE GLUCONATE CLOTH 2 % EX PADS: 6.0000 | MEDICATED_PAD | Freq: Every day | CUTANEOUS | Status: DC

## 2019-12-28 MED ORDER — POTASSIUM CHLORIDE CRYS ER 20 MEQ PO TBCR
40.0000 meq | EXTENDED_RELEASE_TABLET | Freq: Once | ORAL | Status: AC
  Administered 2019-12-28: 40 meq via ORAL
  Filled 2019-12-28: qty 2

## 2019-12-28 MED ORDER — INSULIN ASPART 100 UNIT/ML ~~LOC~~ SOLN: 0.0000 [IU] | Freq: Three times a day (TID) | SUBCUTANEOUS | Status: DC

## 2019-12-28 MED ORDER — ENOXAPARIN SODIUM 40 MG/0.4ML ~~LOC~~ SOLN
40.0000 mg | SUBCUTANEOUS | Status: DC
  Administered 2019-12-28 – 2019-12-31 (×4): 40 mg via SUBCUTANEOUS
  Filled 2019-12-28 (×4): qty 0.4

## 2019-12-28 MED ORDER — CHLORHEXIDINE GLUCONATE CLOTH 2 % EX PADS
6.0000 | MEDICATED_PAD | Freq: Every day | CUTANEOUS | Status: DC
  Administered 2019-12-28 (×2): 6 via TOPICAL

## 2019-12-28 MED ORDER — INSULIN ASPART 100 UNIT/ML ~~LOC~~ SOLN
0.0000 [IU] | Freq: Three times a day (TID) | SUBCUTANEOUS | Status: DC
  Administered 2019-12-28 (×2): 8 [IU] via SUBCUTANEOUS

## 2019-12-28 MED ORDER — INSULIN GLARGINE 100 UNIT/ML ~~LOC~~ SOLN
15.0000 [IU] | Freq: Every day | SUBCUTANEOUS | Status: DC
  Administered 2019-12-28: 15 [IU] via SUBCUTANEOUS
  Filled 2019-12-28: qty 0.15

## 2019-12-28 MED ORDER — INSULIN ASPART 100 UNIT/ML ~~LOC~~ SOLN
3.0000 [IU] | Freq: Three times a day (TID) | SUBCUTANEOUS | Status: DC
  Administered 2019-12-28: 3 [IU] via SUBCUTANEOUS

## 2019-12-28 NOTE — Progress Notes (Addendum)
Per MD Rai, continue 500 ml of D5/0.45 NS that is currently infusing, then SL PIV.

## 2019-12-28 NOTE — TOC Initial Note (Cosign Needed)
Transition of Care [facility]) - Initial/Assessment Note    Patient Details  Name: Mary Swanson MRN: [id number] Date of Birth: 13-Dec-1955  Transition of Care [facility] CM/SW Contact:    Greg Cutter, LCSW Phone Number: 12/28/2019, 10:03 AM  Clinical Narrative:   LCSW received referral for SNF placement. Emergency Contact call made to Vincent Peyer who is agreeable to SNF placement. Awaiting PASRR. APS involved.              Expected Discharge Plan: Skilled Nursing Facility Barriers to Discharge: [facility] [facility])   Patient Goals and CMS Choice Patient states their goals for this hospitalization and ongoing recovery are:: To get better CMS Medicare.gov Compare Post Acute Care list provided to:: Patient Represenative (must comment)(Patient's emergency contact Doren Custard)    Expected Discharge Plan and Services Expected Discharge Plan: [place]       Living arrangements for the past 2 months: Single Family Home                  Prior Living Arrangements/Services Living arrangements for the past 2 months: Single Family Home Lives with:: Self Patient language and need for interpreter reviewed:: Yes Do you feel safe going back to the place where you live?: No   She needs 24/7 care per friend  Need for Family Participation in Patient Care: Yes (Comment) Care giver support system in place?: Yes (comment)   Criminal Activity/Legal Involvement Pertinent to Current Situation/Hospitalization: No - Comment as needed  Activities of Daily Living Home Assistive Devices/Equipment: None ADL Screening (condition at time of admission) Patient's cognitive ability adequate to safely complete daily activities?: No Is the patient deaf or have difficulty hearing?: No Does the patient have difficulty seeing, even when wearing glasses/contacts?: No Does the patient have difficulty concentrating, remembering, or making decisions?: Yes Patient able to express need for assistance  with ADLs?: No Does the patient have difficulty dressing or bathing?: Yes Independently performs ADLs?: No Communication: Independent Dressing (OT): Needs assistance Is this a change from baseline?: Pre-admission baseline Grooming: Needs assistance Is this a change from baseline?: Pre-admission baseline Feeding: Needs assistance Is this a change from baseline?: Pre-admission baseline Bathing: Needs assistance Is this a change from baseline?: Pre-admission baseline Toileting: Needs assistance Is this a change from baseline?: Pre-admission baseline In/Out Bed: Needs assistance Is this a change from baseline?: Pre-admission baseline Walks in Home: Independent Does the patient have difficulty walking or climbing stairs?: No Weakness of Legs: None Weakness of Arms/Hands: None  Permission Sought/Granted Permission sought to share information with : Case Manager, Customer service manager, PCP, Other (comment)(APS) Permission granted to share information with : Yes, Release of Information Signed  Share Information with NAME: APS  Permission granted to share info w AGENCY: APS  Permission granted to share info w Relationship: Tally Joe     Emotional Assessment Appearance:: Appears stated age     Orientation: : Oriented to Self, Oriented to Place, Oriented to Situation, Oriented to  Time Alcohol / Substance Use: Never Used Psych Involvement: No (comment)  Admission diagnosis:  Hyperglycemia [R73.9] Abnormal CXR [R93.89] Hyperosmolar hyperglycemic state (HHS) ([place]) [E11.00, E11.65] Patient Active Problem List   Diagnosis Date Noted  . Hyperosmolar hyperglycemic state (HHS) ([place]) 12/27/2019  . AKI (acute kidney injury) ([place]) 12/27/2019  . Dehydration 12/27/2019  . Elevated blood pressure reading 12/03/2019  . Memory loss of unknown cause 12/03/2019  . Hyperglycemia 12/03/2019  . ACE-inhibitor cough 03/14/2019  . Diabetes mellitus without complication ([place])   .  Heart murmur, systolic 05/29/2017  . DIABETES MELLITUS II, UNCOMPLICATED 01/24/2007  . Hyperlipidemia 01/24/2007  . OBESITY, NOS 01/24/2007  . Anemia 01/24/2007  . History of depression 01/24/2007  . RHINITIS, ALLERGIC 01/24/2007  . Type 2 diabetes mellitus with hyperglycemia (HCC) 01/24/2007   PCP:  Lavonda Jumbo, DO Pharmacy:   [id number] - [place] - [address] [place] ([place]) [place] [address] Phone: [phone] Fax: [phone]   Readmission Risk Interventions No flowsheet data found.  Dickie La, BSW, MSW, LCSW

## 2019-12-28 NOTE — Progress Notes (Signed)
Inpatient Diabetes Program Recommendations  AACE/ADA: New Consensus Statement on Inpatient Glycemic Control   Target Ranges:  Prepandial:   less than 140 mg/dL      Peak postprandial:   less than 180 mg/dL (1-2 hours)      Critically ill patients:  140 - 180 mg/dL   Results for NELLIE, PESTER (MRN [id number]) as of 12/28/2019 09:29  Ref. Range 12/28/2019 00:51 12/28/2019 02:02 12/28/2019 02:57 12/28/2019 03:51 12/28/2019 04:59 12/28/2019 08:15  Glucose-Capillary Latest Ref Range: 70 - 99 mg/dL 081 (H) 448 (H) 185 (H) 161 (H) 226 (H) 272 (H)   Results for PALMA, BUSTER (MRN [id number]) as of 12/28/2019 09:29  Ref. Range 12/27/2019 16:35 12/27/2019 18:51 12/27/2019 20:20 12/27/2019 20:57 12/27/2019 21:45 12/27/2019 22:48 12/27/2019 23:56  Glucose-Capillary Latest Ref Range: 70 - 99 mg/dL >378 (HH) >588 (HH) 502 (HH) 287 (H) 271 (H) 120 (H) 124 (H)  Results for VALI, CAPANO (MRN [id number]) as of 12/28/2019 09:29  Ref. Range 12/28/2019 06:00  Hemoglobin A1C Latest Ref Range: 4.8 - 5.6 % 11.9 (H)  Results for LIZZETH, MEDER (MRN [id number]) as of 12/28/2019 09:29  Ref. Range 10/01/2019 09:26  HbA1c, POC (controlled diabetic range) Latest Ref Range: 0.0 - 7.0 % 9.9 (A)   Review of Glycemic Control  Diabetes history: DM2 Outpatient Diabetes medications: Lantus 10 units TID (also noted 30 units daily), Humalog 3 units TID with meals (not taking), NPH 10 units QAM, NPH 5 units QPM, Metformin XR 500 mg BID, Regular 3 units TID with meals (not taking) Current orders for Inpatient glycemic control: Lantus 15 units QHS, Novolog 0-15 units TID with meals, Novolog 3 units TID with meals  Inpatient Diabetes Program Recommendations:    Insulin-Basal: Please consider increasing Lantus to 20 units QHS.  Insulin-Meal Coverage: Please consider increasing meal coverage to Novolog 5 units TID with meals.  Insulin-Correction: Please consider ordering Novolog 0-5 units QHS.  NOTE: Noted consult for Diabetes Coordinator.  Diabetes Coordinator is not on campus over the weekend but available by pager from 8am to 5pm for questions or concerns. Chart reviewed. Noted patient seen by Dr. Dareen Piano at [facility] on 12/03/19 for memory loss and per note "referred to CCM for social work assistance in assessing patient's living conditions as it seems that it may be unsafe or unhygienic for her to be living in her current conditions.  I have some suspicion that patient is not taking her medications correctly.  According to her medication list, she appears to be on 30 units Lantus daily, Humalog 3 units at meals, NPH 10 units in the morning and 5 units at night and also 3 units at each meal +500 mg twice daily of Metformin." Noted D. Christell Constant, Social Worker followed up on 12/09/19 and made a referral to adult protective services. Per Dr. Lockie Mola note on 12/28/19, patient was found walking outside with underwear on and EMS noted living in very poor conditions by herself. Anticipate patient is unable to provide adequate care for self to effectively perform daily task for DM self management. Sent chat message to Dr. Isidoro Donning regarding recommendations and concern about patient's ability to self manage DM.  Thanks, Orlando Penner, RN, MSN, CDE Diabetes Coordinator Inpatient Diabetes Program [phone] (Team Pager from 8am to 5pm)

## 2019-12-28 NOTE — Evaluation (Signed)
Physical Therapy Evaluation Patient Details Name: Mary Swanson MRN: [id number] DOB: 02/24/1956 Today's Date: 12/28/2019   History of Present Illness  64 yo female admitted with hyperglycemic state, AMS. Pt was found outside walking in her underwear. Hx of obesity, memory loss, anemia, DM  Clinical Impression  On eval, pt was Min assist for mobility. She walked ~500 feet around the unit with support of IV pole for steadying. She participated well. She is talkative and conversation is tangential at times. She has some difficulty with completing ADLs. Per chart, plan is for SNF. May need to consider exploring transitioning to an ALF instead of living at home alone. Will follow during hospital stay.     Follow Up Recommendations SNF    Equipment Recommendations  None recommended by PT    Recommendations for Other Services       Precautions / Restrictions Precautions Precautions: Fall Restrictions Weight Bearing Restrictions: No      Mobility  Bed Mobility Overal bed mobility: Needs Assistance Bed Mobility: Supine to Sit;Sit to Supine     Supine to sit: Supervision;HOB elevated Sit to supine: Supervision;HOB elevated   General bed mobility comments: for safety, lines  Transfers Overall transfer level: Needs assistance Equipment used: None Transfers: Sit to/from Stand Sit to Stand: Min assist         General transfer comment: Assist to steady  Ambulation/Gait Ambulation/Gait assistance: Min assist Gait Distance (Feet): 500 Feet Assistive device: IV Pole Gait Pattern/deviations: Step-through pattern;Decreased stride length     General Gait Details: Assist to steady.  Stairs            Wheelchair Mobility    Modified Rankin (Stroke Patients Only)       Balance Overall balance assessment: Needs assistance           Standing balance-Leahy Scale: Fair                               Pertinent Vitals/Pain Pain Assessment: No/denies  pain    Home Living Family/patient expects to be discharged to:: Unsure Living Arrangements: Alone                    Prior Function Level of Independence: Independent               Hand Dominance        Extremity/Trunk Assessment   Upper Extremity Assessment Upper Extremity Assessment: Overall WFL for tasks assessed    Lower Extremity Assessment Lower Extremity Assessment: Generalized weakness    Cervical / Trunk Assessment Cervical / Trunk Assessment: Normal  Communication   Communication: No difficulties  Cognition Arousal/Alertness: Awake/alert Behavior During Therapy: WFL for tasks assessed/performed Overall Cognitive Status: No family/caregiver present to determine baseline cognitive functioning Area of Impairment: Memory;Safety/judgement;Problem solving                     Memory: Decreased short-term memory   Safety/Judgement: Decreased awareness of safety;Decreased awareness of deficits   Problem Solving: Requires tactile cues        General Comments      Exercises     Assessment/Plan    PT Assessment Patient needs continued PT services  PT Problem List Decreased mobility;Decreased balance       PT Treatment Interventions DME instruction;Gait training;Therapeutic exercise;Therapeutic activities;Patient/family education;Balance training;Functional mobility training    PT Goals (Current goals can be found in the Care Plan section)  Acute  Rehab PT Goals Patient Stated Goal: to go home PT Goal Formulation: With patient Time For Goal Achievement: 01/11/20 Potential to Achieve Goals: Good    Frequency Min 3X/week   Barriers to discharge        Co-evaluation               AM-PAC PT "6 Clicks" Mobility  Outcome Measure Help needed turning from your back to your side while in a flat bed without using bedrails?: A Little Help needed moving from lying on your back to sitting on the side of a flat bed without using  bedrails?: A Little Help needed moving to and from a bed to a chair (including a wheelchair)?: A Little Help needed standing up from a chair using your arms (e.g., wheelchair or bedside chair)?: A Little Help needed to walk in hospital room?: A Little Help needed climbing 3-5 steps with a railing? : A Little 6 Click Score: 18    End of Session Equipment Utilized During Treatment: Gait belt Activity Tolerance: Patient tolerated treatment well Patient left: in bed;with call bell/phone within reach;with bed alarm set(with mittens on)   PT Visit Diagnosis: Unsteadiness on feet (R26.81)    Time: 4917-9150 PT Time Calculation (min) (ACUTE ONLY): 38 min   Charges:   PT Evaluation $PT Eval Low Complexity: 1 Low PT Treatments $Gait Training: 8-22 mins $Therapeutic Activity: 8-22 mins          Doreatha Massed, PT Acute Rehabilitation

## 2019-12-28 NOTE — Progress Notes (Signed)
[facility]                                                                              Patient Demographics  Mary Swanson, is a 64 y.o. female, DOB - November 07, 1956, UJW:[id number]  Admit date - 12/27/2019   Admitting Physician Therisa Doyne, MD  Outpatient Primary MD for the patient is Autry-Lott, Randa Evens, DO  Outpatient specialists:   LOS - 0  days   Medical records reviewed and are as summarized below:    Chief Complaint  Patient presents with  . Hyperglycemia  . Altered Mental Status       Brief summary   Patient is a 64 yo female with a history of diabetes mellitus type 2, poorly controlled, noncompliance, obesity, hypertension, hyperlipidemia presented to ED with altered mental status.  Patient was found outside walking in her underwear in nearly freezing temperatures.  Neighbor called the EMS.  EMS was able to assess her living conditions and found to be very poor.  Per neighbors, she may have a history of drug abuse.  She was found to have high blood sugar by EMS and was brought to ED. Case management has been involved in assessing the living conditions and were deemed unsafe.  APS report has been filed. HbA1c 11.9   Assessment & Plan    Principal Problem:   Hyperosmolar hyperglycemic state (HHS) (HCC) - history of uncontrolled diabetes mellitus, hemoglobin A1c 11.9, non compliant -Unclear if patient is even taking insulin at home, very confused on examination, unhygienic living standards at home.  APS involved. -Patient has been transitioned from insulin drip to subcutaneous insulin -CBGs still elevated, increase Lantus to 20 units nightly, increase meal coverage to NovoLog 5 units 3 times daily AC, continue sliding scale insulin   Active Problems: Diabetes mellitus type 2, IDDM, uncontrolled with hyperglycemia -As #1    Hyperlipidemia -CK elevated, hold statin    AKI (acute kidney injury) (HCC), hypernatremia, dehydration,  rhabdomyolysis -Creatinine 1.09 at the time of admission, -Patient received aggressive IV fluid hydration with normal saline, sodium trended up to 154 -Changed IV fluids to D5 half-normal saline, will recheck bmet at 12 PM    Acute metabolic encephalopathy -Likely has underlying dementia, unclear if patient has alcoholism or substance use -Ammonia level normal, B12 folate normal, TSH 1.03.  Follow B1, copper level, ceruloplasmin -CT head showed cerebral atrophy.  Follow MRI brain, likely has underlying dementia   Obesity Estimated body mass index is 32.33 kg/m as calculated from the following:   Height as of this encounter: 5' (1.524 m).   Weight as of this encounter: 75.1 kg.    Code Status: Full CODE STATUS DVT Prophylaxis:  Lovenox  Family Communication: Patient states that she lives alone, has no family around.  Disposition Plan: Patient from home, unsafe for living, social work consulted for skilled nursing facility   Time Spent in minutes     Procedures:  None  Consultants:   None   Antimicrobials:   Anti-infectives (From admission, onward)   None         Medications  Scheduled Meds: . aspirin  81 mg  Oral Daily  . Chlorhexidine Gluconate Cloth  6 each Topical Daily  . enoxaparin (LOVENOX) injection  40 mg Subcutaneous Q24H  . insulin aspart  0-15 Units Subcutaneous TID WC  . insulin aspart  3 Units Subcutaneous TID WC  . insulin glargine  15 Units Subcutaneous QHS  . mupirocin ointment   Nasal BID  . thiamine  100 mg Oral Daily   Continuous Infusions: . dextrose 5 % and 0.45% NaCl 75 mL/hr at 12/27/19 2255   PRN Meds:.dextrose, dextrose      Subjective:   Mary Swanson was seen and examined today.  Confused, tangential, states she is at her home, does not remember why she was brought to ED.  Does not remember her insulin regimen at home.  Currently no fevers or chills, no ongoing nausea vomiting or diarrhea.    Objective:   Vitals:    12/28/19 0000 12/28/19 0042 12/28/19 0400 12/28/19 0800  BP: (!) 155/54   (!) 173/68  Pulse: 100     Resp: 15   13  Temp:  98.3 F (36.8 C) 98.7 F (37.1 C) 98.6 F (37 C)  TempSrc:  Oral Oral Oral  SpO2: 96%   94%  Weight:      Height:        Intake/Output Summary (Last 24 hours) at 12/28/2019 1040 Last data filed at 12/28/2019 0500 Gross per 24 hour  Intake 1320.47 ml  Output 200 ml  Net 1120.47 ml     Wt Readings from Last 3 Encounters:  12/27/19 75.1 kg  12/03/19 77.7 kg  10/01/19 79.4 kg     Exam  General: Alert and awake, confused  Eyes:  HEENT:  Atraumatic, normocephalic  Cardiovascular: S1 S2 auscultated, no murmurs, RRR  Respiratory: Clear to auscultation bilaterally, no wheezing, rales or rhonchi  Gastrointestinal: Soft, nontender, nondistended, + bowel sounds  Ext: no pedal edema bilaterally  Neuro: Strength 5/5 upper and lower extremities bilaterally  Musculoskeletal: No digital cyanosis, clubbing  Skin: No rashes  Psych: Confused   Data Reviewed:  I have personally reviewed following labs and imaging studies  Micro Results Recent Results (from the past 240 hour(s))  Respiratory Panel by RT PCR (Flu A&B, Covid) - Nasopharyngeal Swab     Status: None   Collection Time: 12/27/19  5:45 PM   Specimen: Nasopharyngeal Swab  Result Value Ref Range Status   SARS Coronavirus 2 by RT PCR NEGATIVE NEGATIVE Final    Comment: (NOTE) SARS-CoV-2 target nucleic acids are NOT DETECTED. The SARS-CoV-2 RNA is generally detectable in upper respiratoy specimens during the acute phase of infection. The lowest concentration of SARS-CoV-2 viral copies this assay can detect is 131 copies/mL. A negative result does not preclude SARS-Cov-2 infection and should not be used as the sole basis for treatment or other patient management decisions. A negative result may occur with  improper specimen collection/handling, submission of specimen other than nasopharyngeal  swab, presence of viral mutation(s) within the areas targeted by this assay, and inadequate number of viral copies (<131 copies/mL). A negative result must be combined with clinical observations, patient history, and epidemiological information. The expected result is Negative. Fact Sheet for Patients:  PinkCheek.be Fact Sheet for Healthcare Providers:  GravelBags.it This test is not yet ap proved or cleared by the [place] FDA and  has been authorized for detection and/or diagnosis of SARS-CoV-2 by FDA under an Emergency Use Authorization (EUA). This EUA will remain  in effect (meaning this test can be used)  for the duration of the COVID-19 declaration under Section 564(b)(1) of the Act, 21 U.S.C. section 360bbb-3(b)(1), unless the authorization is terminated or revoked sooner.    Influenza A by PCR NEGATIVE NEGATIVE Final   Influenza B by PCR NEGATIVE NEGATIVE Final    Comment: (NOTE) The Xpert Xpress SARS-CoV-2/FLU/RSV assay is intended as an aid in  the diagnosis of influenza from Nasopharyngeal swab specimens and  should not be used as a sole basis for treatment. Nasal washings and  aspirates are unacceptable for Xpert Xpress SARS-CoV-2/FLU/RSV  testing. Fact Sheet for Patients: https://www.moore.com/ Fact Sheet for Healthcare Providers: https://www.young.biz/ This test is not yet approved or cleared by the [place] FDA and  has been authorized for detection and/or diagnosis of SARS-CoV-2 by  FDA under an Emergency Use Authorization (EUA). This EUA will remain  in effect (meaning this test can be used) for the duration of the  Covid-19 declaration under Section 564(b)(1) of the Act, 21  U.S.C. section 360bbb-3(b)(1), unless the authorization is  terminated or revoked. Performed at [facility], [address] [place], [place] [address]   MRSA PCR  Screening     Status: Abnormal   Collection Time: 12/27/19  9:37 PM   Specimen: Nasal Mucosa; Nasopharyngeal  Result Value Ref Range Status   MRSA by PCR POSITIVE (A) NEGATIVE Final    Comment:        The GeneXpert MRSA Assay (FDA approved for NASAL specimens only), is one component of a comprehensive MRSA colonization surveillance program. It is not intended to diagnose MRSA infection nor to guide or monitor treatment for MRSA infections. RESULT CALLED TO, READ BACK BY AND VERIFIED WITH: WEAVER, RN ON 12/28/19 @ 0058 BY LE Performed at [facility], [address], [place], [place] [address]     Radiology Reports CT Head Wo Contrast  Result Date: 12/27/2019 CLINICAL DATA:  Altered mental status EXAM: CT HEAD WITHOUT CONTRAST TECHNIQUE: Contiguous axial images were obtained from the base of the skull through the vertex without intravenous contrast. COMPARISON:  07/27/2019 FINDINGS: Brain: Mildly advanced cerebral atrophy. This results in mild prominence of the the lateral ventricles, similar. No mass lesion, acute infarct, intra-axial, or extra-axial fluid collection. Vascular: No hyperdense vessel or unexpected calcification. Skull: Normal skull. Sinuses/Orbits: Normal imaged portions of the orbits and globes. Minimal fluid or mucosal thickening in the right maxillary sinus. Clear mastoid air cells. Other: None. IMPRESSION: 1.  No acute intracranial abnormality. 2. Cerebral atrophy Electronically Signed   By: Jeronimo Greaves M.D.   On: 12/27/2019 17:55   DG Chest Portable 1 View  Result Date: 12/27/2019 CLINICAL DATA:  Altered mental status EXAM: PORTABLE CHEST 1 VIEW COMPARISON:  None. FINDINGS: The heart size is normal. There is no pneumothorax or large pleural effusion. There is a possible retrocardiac opacity that is somewhat suboptimally evaluated secondary to an overlapping breast implant. Aortic calcifications are noted. There are peripherally calcified breast  implants. IMPRESSION: Questionable retrocardiac opacity which may represent atelectasis or infiltrate. An overlapping breast implant limits evaluation of this finding. Electronically Signed   By: Katherine Mantle M.D.   On: 12/27/2019 17:43    Lab Data:  CBC: Recent Labs  Lab 12/27/19 1720  WBC 9.5  NEUTROABS 8.5*  HGB 13.1  HCT 42.6  MCV 94.7  PLT 321   Basic Metabolic Panel: Recent Labs  Lab 12/27/19 1720 12/27/19 1917 12/27/19 2222 12/28/19 0000 12/28/19 0600  NA 140  --  154* 152*  151*  K 5.2*  --  3.5 3.3* 3.2*  CL 102  --  116* 116* 115*  CO2 25  --  29 28 28   GLUCOSE 911*  --  140* 131* 225*  BUN 19  --  16 15 14   CREATININE 1.09*  --  0.68 0.64 0.58  CALCIUM 10.3  --  10.1 9.7 9.0  MG  --  2.6*  --   --  2.0  PHOS  --  5.1*  --   --  3.5   GFR: Estimated Creatinine Clearance: 63.4 mL/min (by C-G formula based on SCr of 0.58 mg/dL). Liver Function Tests: Recent Labs  Lab 12/27/19 1720  AST 52*  ALT 28  ALKPHOS 105  BILITOT 0.9  PROT 8.1  ALBUMIN 4.3   Recent Labs  Lab 12/27/19 1720  LIPASE 19   Recent Labs  Lab 12/28/19 0846  AMMONIA 25   Coagulation Profile: No results for input(s): INR, PROTIME in the last 168 hours. Cardiac Enzymes: Recent Labs  Lab 12/27/19 1917 12/28/19 0600  CKTOTAL 1,948* 782*   BNP (last 3 results) No results for input(s): PROBNP in the last 8760 hours. HbA1C: Recent Labs    12/28/19 0600  HGBA1C 11.9*   CBG: Recent Labs  Lab 12/28/19 0202 12/28/19 0257 12/28/19 0351 12/28/19 0459 12/28/19 0815  GLUCAP 154* 182* 161* 226* 272*   Lipid Profile: No results for input(s): CHOL, HDL, LDLCALC, TRIG, CHOLHDL, LDLDIRECT in the last 72 hours. Thyroid Function Tests: Recent Labs    12/28/19 0846  TSH 1.031   Anemia Panel: Recent Labs    12/28/19 0846  VITAMINB12 307  FOLATE 9.6   Urine analysis:    Component Value Date/Time   COLORURINE STRAW (A) 12/27/2019 1635   APPEARANCEUR CLEAR  12/27/2019 1635   LABSPEC 1.031 (H) 12/27/2019 1635   PHURINE 5.0 12/27/2019 1635   GLUCOSEU >=500 (A) 12/27/2019 1635   HGBUR SMALL (A) 12/27/2019 1635   BILIRUBINUR NEGATIVE 12/27/2019 1635   KETONESUR NEGATIVE 12/27/2019 1635   PROTEINUR NEGATIVE 12/27/2019 1635   NITRITE NEGATIVE 12/27/2019 1635   LEUKOCYTESUR NEGATIVE 12/27/2019 1635       M.D. [facility] 12/28/2019, 10:40 AM   Call night coverage person covering after 7pm

## 2019-12-29 ENCOUNTER — Telehealth: Payer: Self-pay | Admitting: Family Medicine

## 2019-12-29 LAB — BASIC METABOLIC PANEL
Anion gap: 6 (ref 5–15)
BUN: 15 mg/dL (ref 8–23)
CO2: 27 mmol/L (ref 22–32)
Calcium: 8.7 mg/dL — ABNORMAL LOW (ref 8.9–10.3)
Chloride: 106 mmol/L (ref 98–111)
Creatinine, Ser: 0.65 mg/dL (ref 0.44–1.00)
GFR calc Af Amer: >60 mL/min (ref >60)
GFR calc non Af Amer: >60 mL/min (ref >60)
Glucose, Bld: 93 mg/dL (ref 70–99)
Potassium: 3.6 mmol/L (ref 3.5–5.1)
Sodium: 139 mmol/L (ref 135–145)

## 2019-12-29 LAB — CBC
HCT: 34.6 % — ABNORMAL LOW (ref 36.0–46.0)
Hemoglobin: 10.8 g/dL — ABNORMAL LOW (ref 12.0–15.0)
MCH: 29.2 pg (ref 26.0–34.0)
MCHC: 31.2 g/dL (ref 30.0–36.0)
MCV: 93.5 fL (ref 80.0–100.0)
Platelets: 242 10*3/uL (ref 150–400)
RBC: 3.7 MIL/uL — ABNORMAL LOW (ref 3.87–5.11)
RDW: 12.9 % (ref 11.5–15.5)
WBC: 6.4 10*3/uL (ref 4.0–10.5)
nRBC: 0 % (ref 0.0–0.2)

## 2019-12-29 LAB — CK: Total CK: 507 U/L — ABNORMAL HIGH (ref 38–234)

## 2019-12-29 LAB — GLUCOSE, CAPILLARY
Glucose-Capillary: 118 mg/dL — ABNORMAL HIGH (ref 70–99)
Glucose-Capillary: 129 mg/dL — ABNORMAL HIGH (ref 70–99)
Glucose-Capillary: 236 mg/dL — ABNORMAL HIGH (ref 70–99)
Glucose-Capillary: 320 mg/dL — ABNORMAL HIGH (ref 70–99)
Glucose-Capillary: 81 mg/dL (ref 70–99)

## 2019-12-29 LAB — URINE CULTURE

## 2019-12-29 LAB — CERULOPLASMIN: Ceruloplasmin: 28.9 mg/dL (ref 19.0–39.0)

## 2019-12-29 MED ORDER — AMLODIPINE BESYLATE 10 MG PO TABS
10.0000 mg | ORAL_TABLET | Freq: Every day | ORAL | Status: DC
  Administered 2019-12-30 – 2020-01-01 (×3): 10 mg via ORAL
  Filled 2019-12-29 (×3): qty 1

## 2019-12-29 MED ORDER — INSULIN ASPART 100 UNIT/ML ~~LOC~~ SOLN
0.0000 [IU] | Freq: Every day | SUBCUTANEOUS | Status: DC
  Administered 2019-12-30: 2 [IU] via SUBCUTANEOUS
  Administered 2019-12-30: 3 [IU] via SUBCUTANEOUS
  Administered 2019-12-31: 2 [IU] via SUBCUTANEOUS

## 2019-12-29 MED ORDER — INSULIN ASPART 100 UNIT/ML ~~LOC~~ SOLN: 4.0000 [IU] | Freq: Three times a day (TID) | SUBCUTANEOUS | Status: DC

## 2019-12-29 MED ORDER — ADULT MULTIVITAMIN W/MINERALS CH
1.0000 | ORAL_TABLET | Freq: Every day | ORAL | Status: DC
  Administered 2019-12-29 – 2020-01-01 (×4): 1 via ORAL
  Filled 2019-12-29 (×4): qty 1

## 2019-12-29 MED ORDER — PRO-STAT SUGAR FREE PO LIQD
30.0000 mL | Freq: Two times a day (BID) | ORAL | Status: DC
  Administered 2019-12-29 – 2019-12-31 (×6): 30 mL via ORAL
  Filled 2019-12-29 (×5): qty 30

## 2019-12-29 MED ORDER — INSULIN GLARGINE 100 UNIT/ML ~~LOC~~ SOLN
15.0000 [IU] | Freq: Every day | SUBCUTANEOUS | Status: DC
  Administered 2019-12-30: 15 [IU] via SUBCUTANEOUS
  Filled 2019-12-29 (×2): qty 0.15

## 2019-12-29 MED ORDER — ENSURE MAX PROTEIN PO LIQD
11.0000 [oz_av] | Freq: Two times a day (BID) | ORAL | Status: DC
  Administered 2019-12-29 – 2020-01-01 (×6): 11 [oz_av] via ORAL
  Filled 2019-12-29 (×7): qty 330

## 2019-12-29 MED ORDER — INSULIN ASPART 100 UNIT/ML ~~LOC~~ SOLN
0.0000 [IU] | Freq: Three times a day (TID) | SUBCUTANEOUS | Status: DC
  Administered 2019-12-29: 3 [IU] via SUBCUTANEOUS
  Administered 2019-12-29: 7 [IU] via SUBCUTANEOUS
  Administered 2019-12-30: 2 [IU] via SUBCUTANEOUS
  Administered 2019-12-30 (×2): 3 [IU] via SUBCUTANEOUS
  Administered 2019-12-31: 7 [IU] via SUBCUTANEOUS
  Administered 2019-12-31 (×2): 2 [IU] via SUBCUTANEOUS

## 2019-12-29 NOTE — TOC Progression Note (Signed)
Transition of Care [facility]) - Progression Note    Patient Details  Name: Mary Swanson MRN: [id number] Date of Birth: 01-11-1956  [facility] CM/SW Contact  Geni Bers, RN Phone Number: 12/29/2019, 4:07 PM  Clinical Narrative:    Pt's clinicals faxed to [facility].    Expected Discharge Plan: Skilled Nursing Facility Barriers to Discharge: Awaiting State Approval [facility])  Expected Discharge Plan and Services Expected Discharge Plan: Skilled Nursing Facility       Living arrangements for the past 2 months: Single Family Home                                       Social Determinants of Health (SDOH) Interventions    Readmission Risk Interventions No flowsheet data found.

## 2019-12-29 NOTE — Telephone Encounter (Signed)
Ms. Allison Quarry is calling for the patient. Mary Swanson is in the hospital at this time and would like to speak to the social worker for her mother. Please call her at [phone]. [name]

## 2019-12-29 NOTE — Progress Notes (Signed)
Inpatient Diabetes Program Recommendations  AACE/ADA: New Consensus Statement on Inpatient Glycemic Control (2015)  Target Ranges:  Prepandial:   less than 140 mg/dL      Peak postprandial:   less than 180 mg/dL (1-2 hours)      Critically ill patients:  140 - 180 mg/dL   Lab Results  Component Value Date   GLUCAP 236 (H) 12/29/2019   HGBA1C 11.9 (H) 12/28/2019    Review of Glycemic Control  Went to see pt in room this afternoon and pt was sleeping. Spoke to RN and she said pt was only oriented to self, and would not be appropriate for conversation about her diabetes.   TOC working on placement in SNF.  Blood sugars 81, 93, 118, 320, 236. PO intake is improving today. Pro-Stat Sugar Free supplement ordered bid   Inpatient Diabetes Program Recommendations:     Decrease Novolog to 3 units tidwc for meal coverage Decrease Novolog to 0-9 units tidwc and 0-5 units QHS Decrease Lantus to 15 units QHS  Will follow closely throughout hospitalization.  Thank you. Ailene Ards, RD, LDN, CDE Inpatient Diabetes Coordinator [phone]

## 2019-12-29 NOTE — Progress Notes (Signed)
[facility]                                                                              Patient Demographics  Mary Swanson, is a 64 y.o. female, DOB - [birth year], ONG:[id number]  Admit date - 12/27/2019   Admitting Physician Therisa DoyneAnastassia Doutova, MD  Outpatient Primary MD for the patient is Autry-Lott, Randa EvensSimone, DO  Outpatient specialists:   LOS - 1  days   Medical records reviewed and are as summarized below:    Chief Complaint  Patient presents with  . Hyperglycemia  . Altered Mental Status       Brief summary   Patient is a 64 yo female with a history of diabetes mellitus type 2, poorly controlled, noncompliance, obesity, hypertension, hyperlipidemia presented to ED with altered mental status.  Patient was found outside walking in her underwear in nearly freezing temperatures.  Neighbor called the EMS.  EMS was able to assess her living conditions and found to be very poor.  Per neighbors, she may have a history of drug abuse.  She was found to have high blood sugar by EMS and was brought to ED. Case management has been involved in assessing the living conditions and were deemed unsafe.  APS report has been filed. HbA1c 11.9   Assessment & Plan    Principal Problem:   Hyperosmolar hyperglycemic state (HHS) (HCC) - history of uncontrolled diabetes mellitus, hemoglobin A1c 11.9, non compliant -Unclear if patient is even taking insulin at home, very confused on examination, unhygienic living standards at home.  APS involved. -Patient has been transitioned from insulin drip to subcutaneous insulin -Patient was transitioned to subcu insulin, multiple episodes of hypoglycemia, overnight -Decrease Lantus to 15 units nightly, decreased NovoLog sliding scale to sensitive.  Currently holding meal coverage.  Active Problems: Diabetes mellitus type 2, IDDM, uncontrolled with hyperglycemia -As #1    Hyperlipidemia -CK elevated, hold statin    AKI (acute kidney  injury) (HCC), hypernatremia, dehydration, rhabdomyolysis -Creatinine 1.09 at the time of admission, -CK improving, sodium improving.  IV fluids discontinued    Acute metabolic encephalopathy -Likely has underlying dementia, unclear if patient has alcoholism or substance use -Ammonia level normal, B12 folate normal, TSH 1.03.  -B1, copper level, ceruloplasmin pending -CT head showed cerebral atrophy.  MRI brain normal for age, no acute or subacute infarction.  Obesity Estimated body mass index is 32.33 kg/m as calculated from the following:   Height as of this encounter: 5' (1.524 m).   Weight as of this encounter: 75.1 kg.   Essential hypertension Placed on Norvasc, increase to 10 mg daily   Code Status: Full CODE STATUS DVT Prophylaxis:  Lovenox  Family Communication: Patient states that she lives alone, states has a son who lives in [place], does not know about her hospitalization.  She was not able to give me any number for her son.  Called the patient's friend, Catheryn Baconhilip Cobb, who states that he will be making the decisions for her.  Social work aware  Disposition Plan: Patient from home, unsafe for living, social work consulted for skilled nursing facility   Time Spent in  minutes   25 minutes  Procedures:  None  Consultants:   None   Antimicrobials:   Anti-infectives (From admission, onward)   None         Medications  Scheduled Meds: . amLODipine  5 mg Oral Daily  . aspirin  81 mg Oral Daily  . Chlorhexidine Gluconate Cloth  6 each Topical Daily  . enoxaparin (LOVENOX) injection  40 mg Subcutaneous Q24H  . feeding supplement (PRO-STAT SUGAR FREE 64)  30 mL Oral BID  . insulin aspart  0-5 Units Subcutaneous QHS  . insulin aspart  0-9 Units Subcutaneous TID WC  . insulin glargine  15 Units Subcutaneous QHS  . multivitamin with minerals  1 tablet Oral Daily  . mupirocin ointment   Nasal BID  . Ensure Max Protein  11 oz Oral BID  . thiamine  100 mg Oral  Daily   Continuous Infusions:  PRN Meds:.dextrose, dextrose      Subjective:   Tanyia Grabbe was seen and examined today.  Oriented to self, knows she is in the hospital [place] system, knows who is the president.  Tangential otherwise and not oriented to time.  No acute issues overnight.  Objective:   Vitals:   12/28/19 0800 12/28/19 1338 12/28/19 2025 12/29/19 0417  BP: (!) 173/68 (!) 162/66 (!) 147/58 (!) 143/75  Pulse:  86 84 70  Resp: 13 16 16 18   Temp: 98.6 F (37 C) 98.4 F (36.9 C) 98.8 F (37.1 C) 98.1 F (36.7 C)  TempSrc: Oral Oral Oral   SpO2: 94% 98% 99% 96%  Weight:      Height:        Intake/Output Summary (Last 24 hours) at 12/29/2019 1248 Last data filed at 12/29/2019 0900 Gross per 24 hour  Intake 1612.85 ml  Output --  Net 1612.85 ml     Wt Readings from Last 3 Encounters:  12/27/19 75.1 kg  12/03/19 77.7 kg  10/01/19 79.4 kg    Physical Exam  General: Alert and oriented x 2, NAD, much more alert and pleasant  Eyes: PERRLA, EOMI, Anicteric Sclera,  HEENT:  Atraumatic, normocephalic  Cardiovascular: S1 S2 clear, no murmurs, RRR. No pedal edema b/l  Respiratory: CTAB, no wheezing, rales or rhonchi  Gastrointestinal: Soft, nontender, nondistended, NBS  Ext: no pedal edema bilaterally  Neuro: no new deficits  Musculoskeletal: No cyanosis, clubbing  Skin: No rashes  Psych: Still confused however much more alert and pleasant today   Data Reviewed:  I have personally reviewed following labs and imaging studies  Micro Results Recent Results (from the past 240 hour(s))  Urine culture     Status: Abnormal   Collection Time: 12/27/19  4:35 PM   Specimen: Urine, Random  Result Value Ref Range Status   Specimen Description   Final    URINE, RANDOM Performed at [facility], [address] [place], [place]    Special Requests   Final    NONE Performed at [facility], [address], [place], [place]    Culture MULTIPLE SPECIES PRESENT, SUGGEST RECOLLECTION (A)  Final   Report Status 12/29/2019 FINAL  Final  Respiratory Panel by RT PCR (Flu A&B, Covid) - Nasopharyngeal Swab     Status: None   Collection Time: 12/27/19  5:45 PM   Specimen: Nasopharyngeal Swab  Result Value Ref Range Status   SARS Coronavirus 2 by RT PCR NEGATIVE NEGATIVE Final    Comment: (NOTE)  SARS-CoV-2 target nucleic acids are NOT DETECTED. The SARS-CoV-2 RNA is generally detectable in upper respiratoy specimens during the acute phase of infection. The lowest concentration of SARS-CoV-2 viral copies this assay can detect is 131 copies/mL. A negative result does not preclude SARS-Cov-2 infection and should not be used as the sole basis for treatment or other patient management decisions. A negative result may occur with  improper specimen collection/handling, submission of specimen other than nasopharyngeal swab, presence of viral mutation(s) within the areas targeted by this assay, and inadequate number of viral copies (<131 copies/mL). A negative result must be combined with clinical observations, patient history, and epidemiological information. The expected result is Negative. Fact Sheet for Patients:  https://www.moore.com/ Fact Sheet for Healthcare Providers:  https://www.young.biz/ This test is not yet ap proved or cleared by the [place] FDA and  has been authorized for detection and/or diagnosis of SARS-CoV-2 by FDA under an Emergency Use Authorization (EUA). This EUA will remain  in effect (meaning this test can be used) for the duration of the COVID-19 declaration under Section 564(b)(1) of the Act, 21 U.S.C. section 360bbb-3(b)(1), unless the authorization is terminated or revoked sooner.    Influenza A by PCR NEGATIVE NEGATIVE Final   Influenza B by PCR NEGATIVE NEGATIVE Final    Comment: (NOTE) The Xpert Xpress  SARS-CoV-2/FLU/RSV assay is intended as an aid in  the diagnosis of influenza from Nasopharyngeal swab specimens and  should not be used as a sole basis for treatment. Nasal washings and  aspirates are unacceptable for Xpert Xpress SARS-CoV-2/FLU/RSV  testing. Fact Sheet for Patients: https://www.moore.com/ Fact Sheet for Healthcare Providers: https://www.young.biz/ This test is not yet approved or cleared by the [place] FDA and  has been authorized for detection and/or diagnosis of SARS-CoV-2 by  FDA under an Emergency Use Authorization (EUA). This EUA will remain  in effect (meaning this test can be used) for the duration of the  Covid-19 declaration under Section 564(b)(1) of the Act, 21  U.S.C. section 360bbb-3(b)(1), unless the authorization is  terminated or revoked. Performed at [facility], [address], [place], [place] [address]   MRSA PCR Screening     Status: Abnormal   Collection Time: 12/27/19  9:37 PM   Specimen: Nasal Mucosa; Nasopharyngeal  Result Value Ref Range Status   MRSA by PCR POSITIVE (A) NEGATIVE Final    Comment:        The GeneXpert MRSA Assay (FDA approved for NASAL specimens only), is one component of a comprehensive MRSA colonization surveillance program. It is not intended to diagnose MRSA infection nor to guide or monitor treatment for MRSA infections. RESULT CALLED TO, READ BACK BY AND VERIFIED WITH: WEAVER, RN ON 12/28/19 @ 0058 BY LE Performed at [facility], [address] [place], [place] [address]     Radiology Reports DG Chest 2 View  Result Date: 12/28/2019 CLINICAL DATA:  Altered mental status. EXAM: CHEST - 2 VIEW COMPARISON:  12/27/2019 FINDINGS: Lungs are adequately inflated without focal airspace consolidation or effusion. Cardiomediastinal silhouette is within normal. Evidence of moderate size hiatal hernia. Remainder the exam is unchanged.  IMPRESSION: 1.  No acute cardiopulmonary disease. 2.  Moderate size hiatal hernia. Electronically Signed   By: Elberta Fortis M.D.   On: 12/28/2019 12:19   CT Head Wo Contrast  Result Date: 12/27/2019 CLINICAL DATA:  Altered mental status EXAM: CT HEAD WITHOUT CONTRAST TECHNIQUE: Contiguous axial images were obtained from the base of the  skull through the vertex without intravenous contrast. COMPARISON:  07/27/2019 FINDINGS: Brain: Mildly advanced cerebral atrophy. This results in mild prominence of the the lateral ventricles, similar. No mass lesion, acute infarct, intra-axial, or extra-axial fluid collection. Vascular: No hyperdense vessel or unexpected calcification. Skull: Normal skull. Sinuses/Orbits: Normal imaged portions of the orbits and globes. Minimal fluid or mucosal thickening in the right maxillary sinus. Clear mastoid air cells. Other: None. IMPRESSION: 1.  No acute intracranial abnormality. 2. Cerebral atrophy Electronically Signed   By: Abigail Miyamoto M.D.   On: 12/27/2019 17:55   MR BRAIN WO CONTRAST  Result Date: 12/28/2019 CLINICAL DATA:  Encephalopathy. Hypertension and diabetes. Altered mental status. EXAM: MRI HEAD WITHOUT CONTRAST TECHNIQUE: Multiplanar, multiecho pulse sequences of the brain and surrounding structures were obtained without intravenous contrast. COMPARISON:  Head CT yesterday. FINDINGS: Brain: Diffusion imaging does not show any acute or subacute infarction. The brainstem and cerebellum are normal. Cerebral hemispheres are normal for age. No small or large vessel infarction. No mass lesion, hemorrhage, hydrocephalus or extra-axial collection. Vascular: Major vessels at the base of the brain show flow. Skull and upper cervical spine: Negative Sinuses/Orbits: Clear/normal Other: None IMPRESSION: Normal exam for age. Electronically Signed   By: Nelson Chimes M.D.   On: 12/28/2019 12:20   DG Chest Portable 1 View  Result Date: 12/27/2019 CLINICAL DATA:  Altered mental  status EXAM: PORTABLE CHEST 1 VIEW COMPARISON:  None. FINDINGS: The heart size is normal. There is no pneumothorax or large pleural effusion. There is a possible retrocardiac opacity that is somewhat suboptimally evaluated secondary to an overlapping breast implant. Aortic calcifications are noted. There are peripherally calcified breast implants. IMPRESSION: Questionable retrocardiac opacity which may represent atelectasis or infiltrate. An overlapping breast implant limits evaluation of this finding. Electronically Signed   By: Constance Holster M.D.   On: 12/27/2019 17:43    Lab Data:  CBC: Recent Labs  Lab 12/27/19 1720 12/29/19 0454  WBC 9.5 6.4  NEUTROABS 8.5*  --   HGB 13.1 10.8*  HCT 42.6 34.6*  MCV 94.7 93.5  PLT 321 174   Basic Metabolic Panel: Recent Labs  Lab 12/27/19 1720 12/27/19 1917 12/27/19 2222 12/28/19 0000 12/28/19 0600 12/28/19 1218 12/29/19 0454  NA   < >  --  154* 152* 151* 149* 139  K   < >  --  3.5 3.3* 3.2* 3.6 3.6  CL   < >  --  116* 116* 115* 114* 106  CO2   < >  --  29 28 28 27 27   GLUCOSE   < >  --  140* 131* 225* 74 93  BUN   < >  --  16 15 14 12 15   CREATININE   < >  --  0.68 0.64 0.58 0.78 0.65  CALCIUM   < >  --  10.1 9.7 9.0 9.5 8.7*  MG  --  2.6*  --   --  2.0  --   --   PHOS  --  5.1*  --   --  3.5  --   --    < > = values in this interval not displayed.   GFR: Estimated Creatinine Clearance: 63.4 mL/min (by C-G formula based on SCr of 0.65 mg/dL). Liver Function Tests: Recent Labs  Lab 12/27/19 1720  AST 52*  ALT 28  ALKPHOS 105  BILITOT 0.9  PROT 8.1  ALBUMIN 4.3   Recent Labs  Lab 12/27/19 1720  LIPASE 19  Recent Labs  Lab 12/28/19 0846  AMMONIA 25   Coagulation Profile: No results for input(s): INR, PROTIME in the last 168 hours. Cardiac Enzymes: Recent Labs  Lab 12/27/19 1917 12/28/19 0600 12/29/19 0454  CKTOTAL 1,948* 782* 507*   BNP (last 3 results) No results for input(s): PROBNP in the last 8760  hours. HbA1C: Recent Labs    12/28/19 0600  HGBA1C 11.9*   CBG: Recent Labs  Lab 12/28/19 2355 12/29/19 0020 12/29/19 0414 12/29/19 0732 12/29/19 1146  GLUCAP 62* 129* 81 118* 320*   Lipid Profile: No results for input(s): CHOL, HDL, LDLCALC, TRIG, CHOLHDL, LDLDIRECT in the last 72 hours. Thyroid Function Tests: Recent Labs    12/28/19 0846  TSH 1.031   Anemia Panel: Recent Labs    12/28/19 0846  VITAMINB12 307  FOLATE 9.6   Urine analysis:    Component Value Date/Time   COLORURINE STRAW (A) 12/27/2019 1635   APPEARANCEUR CLEAR 12/27/2019 1635   LABSPEC 1.031 (H) 12/27/2019 1635   PHURINE 5.0 12/27/2019 1635   GLUCOSEU >=500 (A) 12/27/2019 1635   HGBUR SMALL (A) 12/27/2019 1635   BILIRUBINUR NEGATIVE 12/27/2019 1635   KETONESUR NEGATIVE 12/27/2019 1635   PROTEINUR NEGATIVE 12/27/2019 1635   NITRITE NEGATIVE 12/27/2019 1635   LEUKOCYTESUR NEGATIVE 12/27/2019 1635     Myrissa Chipley M.D. [facility] 12/29/2019, 12:48 PM   Call night coverage person covering after 7pm

## 2019-12-29 NOTE — Progress Notes (Signed)
Hypoglycemic Event  CBG: 62  Treatment: 4 oz juice/soda  Symptoms: None  Follow-up CBG: Time:0022 CBG Result:129  Possible Reasons for Event: Unknown  Comments/MD notified:[place]    Mauriana Dann A Deidra Spease

## 2019-12-29 NOTE — Progress Notes (Signed)
Initial Nutrition Assessment  DOCUMENTATION CODES:   Obesity unspecified  INTERVENTION:  - will order Ensure Max BID, each supplement provides 150 kcal and 30 grams protein. - will order 30 mL Prostat BID, each supplement provides 100 kcal and 15 grams of protein. - will order daily multivitamin with minerals.  - will monitor for need for diet education prior to d/c.    NUTRITION DIAGNOSIS:   Increased nutrient needs related to acute illness as evidenced by estimated needs.  GOAL:   Patient will meet greater than or equal to 90% of their needs  MONITOR:   PO intake, Supplement acceptance, Labs, Weight trends  REASON FOR ASSESSMENT:   Malnutrition Screening Tool, Consult Assessment of nutrition requirement/status  ASSESSMENT:   64 year-old female with medical history of type 2 DM--poorly controlled and with non-compliance, obesity, HTN, and hyperlipidemia. She presented to the ED with AMS. She was noted to be outside walking in her underwear in near-freezing temps. A neighbor called EMS; had evaluated living situation/conditions which were found to be poor. Possible hx of drug abuse.  Patient noted to be a/o to self only. Per flow sheet documentation, she consumed 100% of lunch 1/31 (547 kcal, 33 grams protein) and 20% of breakfast this AM. Per chart review, weight on 1/30 was 166 lb and weight on 1/6 was 171 lb. This indicates 5 lb weight loss (2.9% body weight) in the past 3 weeks; not significant for time frame. Weight on 10/01/19 was 175 lb which indicates 9 lb weight loss (5.1% body weight) in ~3 months; not significant for time frame.  Per notes: - hyperosmolar hyperglycemic state--hx of uncontrolled DM, non-compliant, unsure if taking insulin at home - AKI - acute metabolic encephalopathy--thought to have possible underlying dementia; uncertain alcohol and/or substance abuse - CT head showed cerebral atrophy   Labs reviewed; CBGs: 129, 81, and 118 mg/dl; HgbA1c:  11.9%. Medications reviewed; sliding scale novolog, 15 units lantus/day, 100 mg oral thiamine/day.     NUTRITION - FOCUSED PHYSICAL EXAM:  completed; no muscle and no fat wasting.   Diet Order:   Diet Order            Diet heart healthy/carb modified Room service appropriate? Yes; Fluid consistency: Thin  Diet effective now              EDUCATION NEEDS:   No education needs have been identified at this time  Skin:  Skin Assessment: Reviewed RN Assessment  Last BM:  1/31  Height:   Ht Readings from Last 1 Encounters:  12/27/19 5' (1.524 m)    Weight:   Wt Readings from Last 1 Encounters:  12/27/19 75.1 kg    Ideal Body Weight:  45.4 kg  BMI:  Body mass index is 32.33 kg/m.  Estimated Nutritional Needs:   Kcal:  1875-2100 kcal  Protein:  90-105 grams  Fluid:  >/= 2 L/day     Trenton Gammon, MS, RD, LDN, [facility] Inpatient Clinical Dietitian Pager # [phone] After hours/weekend pager # [phone]

## 2019-12-30 ENCOUNTER — Ambulatory Visit: Payer: Self-pay | Admitting: Licensed Clinical Social Worker

## 2019-12-30 LAB — BASIC METABOLIC PANEL
Anion gap: 7 (ref 5–15)
BUN: 19 mg/dL (ref 8–23)
CO2: 27 mmol/L (ref 22–32)
Calcium: 8.9 mg/dL (ref 8.9–10.3)
Chloride: 102 mmol/L (ref 98–111)
Creatinine, Ser: 0.76 mg/dL (ref 0.44–1.00)
GFR calc Af Amer: >60 mL/min (ref >60)
GFR calc non Af Amer: >60 mL/min (ref >60)
Glucose, Bld: 265 mg/dL — ABNORMAL HIGH (ref 70–99)
Potassium: 4.2 mmol/L (ref 3.5–5.1)
Sodium: 136 mmol/L (ref 135–145)

## 2019-12-30 LAB — GLUCOSE, CAPILLARY
Glucose-Capillary: 164 mg/dL — ABNORMAL HIGH (ref 70–99)
Glucose-Capillary: 206 mg/dL — ABNORMAL HIGH (ref 70–99)
Glucose-Capillary: 230 mg/dL — ABNORMAL HIGH (ref 70–99)
Glucose-Capillary: 231 mg/dL — ABNORMAL HIGH (ref 70–99)
Glucose-Capillary: 271 mg/dL — ABNORMAL HIGH (ref 70–99)

## 2019-12-30 LAB — CBC
HCT: 34.6 % — ABNORMAL LOW (ref 36.0–46.0)
Hemoglobin: 11 g/dL — ABNORMAL LOW (ref 12.0–15.0)
MCH: 29.5 pg (ref 26.0–34.0)
MCHC: 31.8 g/dL (ref 30.0–36.0)
MCV: 92.8 fL (ref 80.0–100.0)
Platelets: 249 10*3/uL (ref 150–400)
RBC: 3.73 MIL/uL — ABNORMAL LOW (ref 3.87–5.11)
RDW: 12.7 % (ref 11.5–15.5)
WBC: 5.2 10*3/uL (ref 4.0–10.5)
nRBC: 0 % (ref 0.0–0.2)

## 2019-12-30 MED ORDER — INSULIN GLARGINE 100 UNIT/ML ~~LOC~~ SOLN
18.0000 [IU] | Freq: Every day | SUBCUTANEOUS | Status: DC
  Administered 2019-12-30 – 2019-12-31 (×2): 18 [IU] via SUBCUTANEOUS
  Filled 2019-12-30 (×4): qty 0.18

## 2019-12-30 MED ORDER — INSULIN ASPART 100 UNIT/ML ~~LOC~~ SOLN
3.0000 [IU] | Freq: Three times a day (TID) | SUBCUTANEOUS | Status: DC
  Administered 2019-12-30 – 2019-12-31 (×4): 3 [IU] via SUBCUTANEOUS

## 2019-12-30 NOTE — Progress Notes (Signed)
Assumed care of pt from another nurse at approximately 2330.  Pt denies needs at this time

## 2019-12-30 NOTE — Progress Notes (Signed)
Pt states that she does not want to name a designated visitor just yet because she does not want Phyilis to be her visitor "she's crazy". Unsure if she is able to make this decision so RN told pt that no visitor would be entered just yet and that Case Management would be notified.

## 2019-12-30 NOTE — Progress Notes (Signed)
[facility]                                                                              Patient Demographics  Mary Swanson, is a 64 y.o. female, DOB - 11/07/1956, ZOX:[id number]  Admit date - 12/27/2019   Admitting Physician Therisa DoyneAnastassia Doutova, MD  Outpatient Primary MD for the patient is Autry-Lott, Randa EvensSimone, DO  Outpatient specialists:   LOS - 2  days   Medical records reviewed and are as summarized below:    Chief Complaint  Patient presents with  . Hyperglycemia  . Altered Mental Status       Brief summary   Patient is a 64 yo female with a history of diabetes mellitus type 2, poorly controlled, noncompliance, obesity, hypertension, hyperlipidemia presented to ED with altered mental status.  Patient was found outside walking in her underwear in nearly freezing temperatures.  Neighbor called the EMS.  EMS was able to assess her living conditions and found to be very poor.  Per neighbors, she may have a history of drug abuse.  She was found to have high blood sugar by EMS and was brought to ED. Case management has been involved in assessing the living conditions and were deemed unsafe.  APS report has been filed. HbA1c 11.9   Assessment & Plan    Principal Problem:   Hyperosmolar hyperglycemic state (HHS) (HCC) - history of uncontrolled diabetes mellitus, hemoglobin A1c 11.9, non compliant -Unclear if patient is even taking insulin at home, very confused on examination, unhygienic living standards at home.  APS involved. -Patient has been transitioned from insulin drip to subcutaneous insulin -Patient was transitioned to subcu insulin, multiple episodes of hypoglycemia on 2/1, overnight.  Hence insulin was decreased. -Now CBGs elevated in 200s, increased Lantus to 18 units at bedtime, added NovoLog meal coverage 3 units 3 times daily AC.  Continue sensitive sliding scale   Active Problems: Diabetes mellitus type 2, IDDM, uncontrolled with  hyperglycemia -As #1    Hyperlipidemia -CK elevated, hold statin    AKI (acute kidney injury) (HCC), hypernatremia, dehydration, rhabdomyolysis -Creatinine 1.09 at the time of admission, -Sodium improving, creatinine 0.76 - CK 507, trending down on 2/1    Acute metabolic encephalopathy -Likely has underlying dementia, patient denies alcohol or substance use. -Ammonia level normal, B12 folate normal, TSH 1.03.  -B1, copper level, ceruloplasmin pending -CT head showed cerebral atrophy.  MRI brain normal for age, no acute or subacute infarction.  Obesity Estimated body mass index is 32.33 kg/m as calculated from the following:   Height as of this encounter: 5' (1.524 m).   Weight as of this encounter: 75.1 kg.   Essential hypertension Placed on Norvasc, increase to 10 mg daily   Code Status: Full CODE STATUS DVT Prophylaxis:  Lovenox  Family Communication: Called the patient's friend, Catheryn Baconhilip Cobb, on 2/1 who states that he will be making the decisions for her.  Social work aware  Disposition Plan: Patient from home, unsafe for living, social work consulted for skilled nursing facility, currently awaiting facility   Time Spent in minutes   25 minutes  Procedures:  None  Consultants:  None   Antimicrobials:   Anti-infectives (From admission, onward)   None         Medications  Scheduled Meds: . amLODipine  10 mg Oral Daily  . aspirin  81 mg Oral Daily  . enoxaparin (LOVENOX) injection  40 mg Subcutaneous Q24H  . feeding supplement (PRO-STAT SUGAR FREE 64)  30 mL Oral BID  . insulin aspart  0-5 Units Subcutaneous QHS  . insulin aspart  0-9 Units Subcutaneous TID WC  . insulin glargine  15 Units Subcutaneous QHS  . multivitamin with minerals  1 tablet Oral Daily  . mupirocin ointment   Nasal BID  . Ensure Max Protein  11 oz Oral BID  . thiamine  100 mg Oral Daily   Continuous Infusions:  PRN Meds:.dextrose, dextrose      Subjective:   Mary Swanson  was seen and examined today.  Pleasant, no acute issues, CBGs still uncontrolled.  Still somewhat confused, oriented to self and place   Objective:   Vitals:   12/29/19 1343 12/29/19 2128 12/30/19 0646 12/30/19 0937  BP: 119/66 126/65 (!) 108/51 (!) 128/59  Pulse: 81 80 65 80  Resp: 16 20 18    Temp: 97.8 F (36.6 C) 98.7 F (37.1 C) 98.4 F (36.9 C)   TempSrc: Oral Oral Oral   SpO2: 95% 98% 95%   Weight:      Height:        Intake/Output Summary (Last 24 hours) at 12/30/2019 1251 Last data filed at 12/30/2019 1218 Gross per 24 hour  Intake 960 ml  Output 1900 ml  Net -940 ml     Wt Readings from Last 3 Encounters:  12/27/19 75.1 kg  12/03/19 77.7 kg  10/01/19 79.4 kg   Physical Exam  General: Alert and oriented x 2, NAD  Cardiovascular: S1 S2 clear,  RRR. No pedal edema b/l  Respiratory: CTAB, no wheezing, rales or rhonchi  Gastrointestinal: Soft, nontender, nondistended, NBS  Ext: no pedal edema bilaterally  Neuro: no new deficits  Musculoskeletal: No cyanosis, clubbing  Skin: No rashes  Psych: alert and oriented x2   Data Reviewed:  I have personally reviewed following labs and imaging studies  Micro Results Recent Results (from the past 240 hour(s))  Urine culture     Status: Abnormal   Collection Time: 12/27/19  4:35 PM   Specimen: Urine, Random  Result Value Ref Range Status   Specimen Description   Final    URINE, RANDOM Performed at [facility], [address] [place], [place]    Special Requests   Final    NONE Performed at [facility], [address], [place], [place]    Culture MULTIPLE SPECIES PRESENT, SUGGEST RECOLLECTION (A)  Final   Report Status 12/29/2019 FINAL  Final  Respiratory Panel by RT PCR (Flu A&B, Covid) - Nasopharyngeal Swab     Status: None   Collection Time: 12/27/19  5:45 PM   Specimen: Nasopharyngeal Swab  Result Value Ref Range Status   SARS Coronavirus  2 by RT PCR NEGATIVE NEGATIVE Final    Comment: (NOTE) SARS-CoV-2 target nucleic acids are NOT DETECTED. The SARS-CoV-2 RNA is generally detectable in upper respiratoy specimens during the acute phase of infection. The lowest concentration of SARS-CoV-2 viral copies this assay can detect is 131 copies/mL. A negative result does not preclude SARS-Cov-2 infection and should not be used as the sole basis for treatment or other patient management decisions. A negative  result may occur with  improper specimen collection/handling, submission of specimen other than nasopharyngeal swab, presence of viral mutation(s) within the areas targeted by this assay, and inadequate number of viral copies (<131 copies/mL). A negative result must be combined with clinical observations, patient history, and epidemiological information. The expected result is Negative. Fact Sheet for Patients:  https://www.moore.com/ Fact Sheet for Healthcare Providers:  https://www.young.biz/ This test is not yet ap proved or cleared by the [place] FDA and  has been authorized for detection and/or diagnosis of SARS-CoV-2 by FDA under an Emergency Use Authorization (EUA). This EUA will remain  in effect (meaning this test can be used) for the duration of the COVID-19 declaration under Section 564(b)(1) of the Act, 21 U.S.C. section 360bbb-3(b)(1), unless the authorization is terminated or revoked sooner.    Influenza A by PCR NEGATIVE NEGATIVE Final   Influenza B by PCR NEGATIVE NEGATIVE Final    Comment: (NOTE) The Xpert Xpress SARS-CoV-2/FLU/RSV assay is intended as an aid in  the diagnosis of influenza from Nasopharyngeal swab specimens and  should not be used as a sole basis for treatment. Nasal washings and  aspirates are unacceptable for Xpert Xpress SARS-CoV-2/FLU/RSV  testing. Fact Sheet for Patients: https://www.moore.com/ Fact Sheet for Healthcare  Providers: https://www.young.biz/ This test is not yet approved or cleared by the [place] FDA and  has been authorized for detection and/or diagnosis of SARS-CoV-2 by  FDA under an Emergency Use Authorization (EUA). This EUA will remain  in effect (meaning this test can be used) for the duration of the  Covid-19 declaration under Section 564(b)(1) of the Act, 21  U.S.C. section 360bbb-3(b)(1), unless the authorization is  terminated or revoked. Performed at [facility], [address], [place], [place] [address]   MRSA PCR Screening     Status: Abnormal   Collection Time: 12/27/19  9:37 PM   Specimen: Nasal Mucosa; Nasopharyngeal  Result Value Ref Range Status   MRSA by PCR POSITIVE (A) NEGATIVE Final    Comment:        The GeneXpert MRSA Assay (FDA approved for NASAL specimens only), is one component of a comprehensive MRSA colonization surveillance program. It is not intended to diagnose MRSA infection nor to guide or monitor treatment for MRSA infections. RESULT CALLED TO, READ BACK BY AND VERIFIED WITH: WEAVER, RN ON 12/28/19 @ 0058 BY LE Performed at [facility], [address] [place], [place] [address]     Radiology Reports DG Chest 2 View  Result Date: 12/28/2019 CLINICAL DATA:  Altered mental status. EXAM: CHEST - 2 VIEW COMPARISON:  12/27/2019 FINDINGS: Lungs are adequately inflated without focal airspace consolidation or effusion. Cardiomediastinal silhouette is within normal. Evidence of moderate size hiatal hernia. Remainder the exam is unchanged. IMPRESSION: 1.  No acute cardiopulmonary disease. 2.  Moderate size hiatal hernia. Electronically Signed   By: Elberta Fortis M.D.   On: 12/28/2019 12:19   CT Head Wo Contrast  Result Date: 12/27/2019 CLINICAL DATA:  Altered mental status EXAM: CT HEAD WITHOUT CONTRAST TECHNIQUE: Contiguous axial images were obtained from the base of the skull through the  vertex without intravenous contrast. COMPARISON:  07/27/2019 FINDINGS: Brain: Mildly advanced cerebral atrophy. This results in mild prominence of the the lateral ventricles, similar. No mass lesion, acute infarct, intra-axial, or extra-axial fluid collection. Vascular: No hyperdense vessel or unexpected calcification. Skull: Normal skull. Sinuses/Orbits: Normal imaged portions of the orbits and globes. Minimal fluid or mucosal thickening in the  right maxillary sinus. Clear mastoid air cells. Other: None. IMPRESSION: 1.  No acute intracranial abnormality. 2. Cerebral atrophy Electronically Signed   By: Jeronimo Greaves M.D.   On: 12/27/2019 17:55   MR BRAIN WO CONTRAST  Result Date: 12/28/2019 CLINICAL DATA:  Encephalopathy. Hypertension and diabetes. Altered mental status. EXAM: MRI HEAD WITHOUT CONTRAST TECHNIQUE: Multiplanar, multiecho pulse sequences of the brain and surrounding structures were obtained without intravenous contrast. COMPARISON:  Head CT yesterday. FINDINGS: Brain: Diffusion imaging does not show any acute or subacute infarction. The brainstem and cerebellum are normal. Cerebral hemispheres are normal for age. No small or large vessel infarction. No mass lesion, hemorrhage, hydrocephalus or extra-axial collection. Vascular: Major vessels at the base of the brain show flow. Skull and upper cervical spine: Negative Sinuses/Orbits: Clear/normal Other: None IMPRESSION: Normal exam for age. Electronically Signed   By: Paulina Fusi M.D.   On: 12/28/2019 12:20   DG Chest Portable 1 View  Result Date: 12/27/2019 CLINICAL DATA:  Altered mental status EXAM: PORTABLE CHEST 1 VIEW COMPARISON:  None. FINDINGS: The heart size is normal. There is no pneumothorax or large pleural effusion. There is a possible retrocardiac opacity that is somewhat suboptimally evaluated secondary to an overlapping breast implant. Aortic calcifications are noted. There are peripherally calcified breast implants. IMPRESSION:  Questionable retrocardiac opacity which may represent atelectasis or infiltrate. An overlapping breast implant limits evaluation of this finding. Electronically Signed   By: Katherine Mantle M.D.   On: 12/27/2019 17:43    Lab Data:  CBC: Recent Labs  Lab 12/27/19 1720 12/29/19 0454 12/30/19 0318  WBC 9.5 6.4 5.2  NEUTROABS 8.5*  --   --   HGB 13.1 10.8* 11.0*  HCT 42.6 34.6* 34.6*  MCV 94.7 93.5 92.8  PLT 321 242 249   Basic Metabolic Panel: Recent Labs  Lab 12/27/19 1917 12/27/19 2222 12/28/19 0000 12/28/19 0600 12/28/19 1218 12/29/19 0454 12/30/19 0318  NA  --    < > 152* 151* 149* 139 136  K  --    < > 3.3* 3.2* 3.6 3.6 4.2  CL  --    < > 116* 115* 114* 106 102  CO2  --    < > 28 28 27 27 27   GLUCOSE  --    < > 131* 225* 74 93 265*  BUN  --    < > 15 14 12 15 19   CREATININE  --    < > 0.64 0.58 0.78 0.65 0.76  CALCIUM  --    < > 9.7 9.0 9.5 8.7* 8.9  MG 2.6*  --   --  2.0  --   --   --   PHOS 5.1*  --   --  3.5  --   --   --    < > = values in this interval not displayed.   GFR: Estimated Creatinine Clearance: 63.4 mL/min (by C-G formula based on SCr of 0.76 mg/dL). Liver Function Tests: Recent Labs  Lab 12/27/19 1720  AST 52*  ALT 28  ALKPHOS 105  BILITOT 0.9  PROT 8.1  ALBUMIN 4.3   Recent Labs  Lab 12/27/19 1720  LIPASE 19   Recent Labs  Lab 12/28/19 0846  AMMONIA 25   Coagulation Profile: No results for input(s): INR, PROTIME in the last 168 hours. Cardiac Enzymes: Recent Labs  Lab 12/27/19 1917 12/28/19 0600 12/29/19 0454  CKTOTAL 1,948* 782* 507*   BNP (last 3 results) No results for input(s):  PROBNP in the last 8760 hours. HbA1C: Recent Labs    12/28/19 0600  HGBA1C 11.9*   CBG: Recent Labs  Lab 12/29/19 1146 12/29/19 1641 12/30/19 0013 12/30/19 0737 12/30/19 1134  GLUCAP 320* 236* 271* 164* 231*   Lipid Profile: No results for input(s): CHOL, HDL, LDLCALC, TRIG, CHOLHDL, LDLDIRECT in the last 72 hours. Thyroid  Function Tests: Recent Labs    12/28/19 0846  TSH 1.031   Anemia Panel: Recent Labs    12/28/19 0846  VITAMINB12 307  FOLATE 9.6   Urine analysis:    Component Value Date/Time   COLORURINE STRAW (A) 12/27/2019 1635   APPEARANCEUR CLEAR 12/27/2019 1635   LABSPEC 1.031 (H) 12/27/2019 1635   PHURINE 5.0 12/27/2019 1635   GLUCOSEU >=500 (A) 12/27/2019 1635   HGBUR SMALL (A) 12/27/2019 1635   BILIRUBINUR NEGATIVE 12/27/2019 1635   KETONESUR NEGATIVE 12/27/2019 1635   PROTEINUR NEGATIVE 12/27/2019 1635   NITRITE NEGATIVE 12/27/2019 1635   LEUKOCYTESUR NEGATIVE 12/27/2019 1635     Haeley Fordham M.D. [facility] 12/30/2019, 12:51 PM   Call night coverage person covering after 7pm

## 2019-12-30 NOTE — Plan of Care (Signed)
  Problem: Education: Goal: Ability to describe self-care measures that may prevent or decrease complications (Diabetes Survival Skills Education) will improve Outcome: Progressing Goal: Individualized Educational Video(s) Outcome: Progressing   Problem: Health Behavior/Discharge Planning: Goal: Ability to identify and utilize available resources and services will improve Outcome: Progressing Goal: Ability to manage health-related needs will improve Outcome: Progressing   Problem: Fluid Volume: Goal: Ability to achieve a balanced intake and output will improve Outcome: Progressing   Problem: Metabolic: Goal: Ability to maintain appropriate glucose levels will improve Outcome: Progressing   Problem: Nutritional: Goal: Maintenance of adequate nutrition will improve Outcome: Progressing Goal: Maintenance of adequate weight for body size and type will improve Outcome: Progressing   Problem: Respiratory: Goal: Will regain and/or maintain adequate ventilation Outcome: Progressing   Problem: Urinary Elimination: Goal: Ability to achieve and maintain adequate renal perfusion and functioning will improve Outcome: Progressing   Problem: Education: Goal: Knowledge of General Education information will improve Description: Including pain rating scale, medication(s)/side effects and non-pharmacologic comfort measures Outcome: Progressing   Problem: Health Behavior/Discharge Planning: Goal: Ability to manage health-related needs will improve Outcome: Progressing   Problem: Clinical Measurements: Goal: Ability to maintain clinical measurements within normal limits will improve Outcome: Progressing Goal: Will remain free from infection Outcome: Progressing Goal: Diagnostic test results will improve Outcome: Progressing Goal: Respiratory complications will improve Outcome: Progressing Goal: Cardiovascular complication will be avoided Outcome: Progressing   Problem:  Activity: Goal: Risk for activity intolerance will decrease Outcome: Progressing   Problem: Nutrition: Goal: Adequate nutrition will be maintained Outcome: Progressing   Problem: Coping: Goal: Level of anxiety will decrease Outcome: Progressing   Problem: Elimination: Goal: Will not experience complications related to bowel motility Outcome: Progressing Goal: Will not experience complications related to urinary retention Outcome: Progressing   Problem: Pain Managment: Goal: General experience of comfort will improve Outcome: Progressing   Problem: Safety: Goal: Ability to remain free from injury will improve Outcome: Progressing   Problem: Skin Integrity: Goal: Risk for impaired skin integrity will decrease Outcome: Progressing

## 2019-12-30 NOTE — Progress Notes (Signed)
Physical Therapy Treatment Patient Details Name: Andrell Tallman MRN: [id number] DOB: [birth year] Today's Date: 12/30/2019    History of Present Illness 64 yo female admitted with hyperglycemic state, AMS. Pt was found outside walking in her underwear. Hx of obesity, memory loss, anemia, DM    PT Comments    Found pt lying in bed, linens soaked with urine-pt seemed unaware. Assisted pt into bathroom to toilet and to clean up a bit/change gown. Pt ambulated well on today. No LOB during session.    Follow Up Recommendations  SNF(may need to consider ALF placement at some point)     Equipment Recommendations  None recommended by PT    Recommendations for Other Services       Precautions / Restrictions Precautions Precautions: Fall Restrictions Weight Bearing Restrictions: No    Mobility  Bed Mobility Overal bed mobility: Modified Independent                Transfers Overall transfer level: Needs assistance   Transfers: Sit to/from Stand Sit to Stand: Supervision         General transfer comment: for safety.  Ambulation/Gait Ambulation/Gait assistance: Supervision Gait Distance (Feet): 750 Feet Assistive device: None Gait Pattern/deviations: Decreased stride length         Stairs             Wheelchair Mobility    Modified Rankin (Stroke Patients Only)       Balance Overall balance assessment: Mild deficits observed, not formally tested                                          Cognition Arousal/Alertness: Awake/alert Behavior During Therapy: WFL for tasks assessed/performed Overall Cognitive Status: No family/caregiver present to determine baseline cognitive functioning Area of Impairment: Problem solving                             Problem Solving: Requires verbal cues        Exercises      General Comments        Pertinent Vitals/Pain Pain Assessment: No/denies pain    Home Living                      Prior Function            PT Goals (current goals can now be found in the care plan section) Progress towards PT goals: Progressing toward goals    Frequency    Min 3X/week      PT Plan Discharge plan needs to be updated    Co-evaluation              AM-PAC PT "6 Clicks" Mobility   Outcome Measure  Help needed turning from your back to your side while in a flat bed without using bedrails?: None Help needed moving from lying on your back to sitting on the side of a flat bed without using bedrails?: None Help needed moving to and from a bed to a chair (including a wheelchair)?: A Little Help needed standing up from a chair using your arms (e.g., wheelchair or bedside chair)?: A Little Help needed to walk in hospital room?: A Little Help needed climbing 3-5 steps with a railing? : A Little 6 Click Score: 20    End of Session Equipment Utilized During Treatment:  Gait belt Activity Tolerance: Patient tolerated treatment well Patient left: in chair;with call bell/phone within reach;with chair alarm set   PT Visit Diagnosis: Unsteadiness on feet (R26.81)     Time: 1552-0802 PT Time Calculation (min) (ACUTE ONLY): 26 min  Charges:  $Gait Training: 23-37 mins                         Doreatha Massed, PT Acute Rehabilitation

## 2019-12-30 NOTE — Plan of Care (Signed)
  Problem: Education: Goal: Ability to describe self-care measures that may prevent or decrease complications (Diabetes Survival Skills Education) will improve Outcome: Progressing Goal: Individualized Educational Video(s) Outcome: Progressing   Problem: Health Behavior/Discharge Planning: Goal: Ability to identify and utilize available resources and services will improve Outcome: Progressing Goal: Ability to manage health-related needs will improve Outcome: Progressing   Problem: Fluid Volume: Goal: Ability to achieve a balanced intake and output will improve Outcome: Progressing   Problem: Metabolic: Goal: Ability to maintain appropriate glucose levels will improve Outcome: Progressing   Problem: Nutritional: Goal: Maintenance of adequate nutrition will improve Outcome: Progressing Goal: Maintenance of adequate weight for body size and type will improve Outcome: Progressing   Problem: Respiratory: Goal: Will regain and/or maintain adequate ventilation Outcome: Progressing   Problem: Urinary Elimination: Goal: Ability to achieve and maintain adequate renal perfusion and functioning will improve Outcome: Progressing

## 2019-12-30 NOTE — Chronic Care Management (AMB) (Signed)
    Clinical Social Work  Care Management Outreach   12/30/2019 Name: [name] MRN: [id number] DOB: January 26, 1956  Mary Swanson is a 64 y.o. year old female who is a primary care patient of Autry-Lott, Simone, DO . LCSW received a voice message from Ms. Cobb to call her ref. Patient as it was very important.  LCSW returned call to Ms. [name] [phone].  Said she heard they are putting patient in a nursing home.  LCSW informed Ms. Cobb no information could be shared about patient as this LCSW was not involved in her current care plan  Plan:  1. Ms. [name] states she will contact the hospital 2. No follow up required by LCSW at this time.   Sammuel Hines, LCSW Clinical Social Worker [facility]   [phone] 12:55 PM

## 2019-12-30 NOTE — NC FL2 (Signed)
[place] LEVEL OF CARE SCREENING TOOL     IDENTIFICATION  Patient Name: Mary Swanson Birthdate: 1956/02/11 Sex: female Admission Date (Current Location): 12/27/2019  [facility] and [place] Number:  [place] and Address:  [facility],  [place], [place]      Provider Number: [id number]  Attending Physician Name and Address:  Mendel Corning, MD  Relative Name and Phone Number:  Lynnae Prude [phone]    Current Level of Care: Hospital Recommended Level of Care: [place] Prior Approval Number:    Date Approved/Denied:   PASRR Number: Waiting on this  Discharge Plan: SNF    Current Diagnoses: Patient Active Problem List   Diagnosis Date Noted  . Acute metabolic encephalopathy 54/62/7035  . Diabetes mellitus ([place]) 12/28/2019  . Hyperosmolar hyperglycemic state (HHS) ([place]) 12/27/2019  . AKI (acute kidney injury) ([place]) 12/27/2019  . Dehydration 12/27/2019  . Elevated blood pressure reading 12/03/2019  . Memory loss of unknown cause 12/03/2019  . Hyperglycemia 12/03/2019  . ACE-inhibitor cough 03/14/2019  . Diabetes mellitus without complication ([place])   . Heart murmur, systolic 00/93/8182  . DIABETES MELLITUS II, UNCOMPLICATED 99/37/1696  . Hyperlipidemia 01/24/2007  . OBESITY, NOS 01/24/2007  . Anemia 01/24/2007  . History of depression 01/24/2007  . RHINITIS, ALLERGIC 01/24/2007  . Type 2 diabetes mellitus with hyperglycemia ([place]) 01/24/2007    Orientation RESPIRATION BLADDER Height & Weight     Self, Time, Situation, Place  Normal Continent Weight: 75.1 kg Height:  5' (152.4 cm)(patient confused)  BEHAVIORAL SYMPTOMS/MOOD NEUROLOGICAL BOWEL NUTRITION STATUS      Continent Diet(Heart [place])  AMBULATORY STATUS COMMUNICATION OF NEEDS Skin   Extensive Assist Verbally Normal                       Personal Care Assistance Level of Assistance  Bathing, Feeding, Dressing Bathing  Assistance: Limited assistance Feeding assistance: Independent Dressing Assistance: Limited assistance     Functional Limitations Info  Sight, Hearing, Speech Sight Info: Adequate Hearing Info: Adequate Speech Info: Adequate    SPECIAL CARE FACTORS FREQUENCY  PT (By licensed PT), OT (By licensed OT)     PT Frequency: Eval and Treat OT Frequency: Eval and Treat            Contractures Contractures Info: Not present    Additional Factors Info  Code Status, Allergies Code Status Info: FULL Allergies Info: Erythromycin, Latex, Lisinopril           Current Medications (12/30/2019):  This is the current hospital active medication list Current Facility-Administered Medications  Medication Dose Route Frequency Provider Last Rate Last Admin  . amLODipine (NORVASC) tablet 10 mg  10 mg Oral Daily Rai, Ripudeep K, MD   10 mg at 12/30/19 0931  . aspirin chewable tablet 81 mg  81 mg Oral Daily Rai, Ripudeep K, MD   81 mg at 12/30/19 0931  . dextrose 50 % solution 0-50 mL  0-50 mL Intravenous PRN Rai, Ripudeep K, MD      . dextrose 50 % solution 0-50 mL  0-50 mL Intravenous PRN Rai, Ripudeep K, MD      . enoxaparin (LOVENOX) injection 40 mg  40 mg Subcutaneous Q24H Rai, Ripudeep K, MD   40 mg at 12/29/19 1759  . feeding supplement (PRO-STAT SUGAR FREE 64) liquid 30 mL  30 mL Oral BID Rai, Ripudeep K, MD   30 mL at 12/29/19 2359  . insulin  aspart (novoLOG) injection 0-5 Units  0-5 Units Subcutaneous QHS Rai, Ripudeep K, MD   3 Units at 12/30/19 0015  . insulin aspart (novoLOG) injection 0-9 Units  0-9 Units Subcutaneous TID WC Rai, Ripudeep K, MD   2 Units at 12/30/19 0810  . insulin glargine (LANTUS) injection 15 Units  15 Units Subcutaneous QHS Rai, Ripudeep K, MD   15 Units at 12/30/19 0014  . multivitamin with minerals tablet 1 tablet  1 tablet Oral Daily Rai, Ripudeep K, MD   1 tablet at 12/30/19 0931  . mupirocin ointment (BACTROBAN) 2 %   Nasal BID Cathren Harsh, MD   Given at  12/30/19 [phone]  . protein supplement (ENSURE MAX) liquid  11 oz Oral BID Rai, Ripudeep K, MD   11 oz at 12/30/19 0932  . thiamine tablet 100 mg  100 mg Oral Daily Rai, Ripudeep K, MD   100 mg at 12/30/19 0932     Discharge Medications: Please see discharge summary for a list of discharge medications.  Relevant Imaging Results:  Relevant Lab Results:   Additional Information SS#[id number]  Geni Bers, RN

## 2019-12-31 DIAGNOSIS — I10 Essential (primary) hypertension: Secondary | ICD-10-CM

## 2019-12-31 LAB — BASIC METABOLIC PANEL
Anion gap: 8 (ref 5–15)
BUN: 21 mg/dL (ref 8–23)
CO2: 27 mmol/L (ref 22–32)
Calcium: 9 mg/dL (ref 8.9–10.3)
Chloride: 104 mmol/L (ref 98–111)
Creatinine, Ser: 0.74 mg/dL (ref 0.44–1.00)
GFR calc Af Amer: >60 mL/min (ref >60)
GFR calc non Af Amer: >60 mL/min (ref >60)
Glucose, Bld: 192 mg/dL — ABNORMAL HIGH (ref 70–99)
Potassium: 3.9 mmol/L (ref 3.5–5.1)
Sodium: 139 mmol/L (ref 135–145)

## 2019-12-31 LAB — CBC
HCT: 33.6 % — ABNORMAL LOW (ref 36.0–46.0)
Hemoglobin: 10.5 g/dL — ABNORMAL LOW (ref 12.0–15.0)
MCH: 29.3 pg (ref 26.0–34.0)
MCHC: 31.3 g/dL (ref 30.0–36.0)
MCV: 93.9 fL (ref 80.0–100.0)
Platelets: 230 10*3/uL (ref 150–400)
RBC: 3.58 MIL/uL — ABNORMAL LOW (ref 3.87–5.11)
RDW: 12.9 % (ref 11.5–15.5)
WBC: 6.1 10*3/uL (ref 4.0–10.5)
nRBC: 0.5 % — ABNORMAL HIGH (ref 0.0–0.2)

## 2019-12-31 LAB — GLUCOSE, CAPILLARY
Glucose-Capillary: 174 mg/dL — ABNORMAL HIGH (ref 70–99)
Glucose-Capillary: 317 mg/dL — ABNORMAL HIGH (ref 70–99)
Glucose-Capillary: 155 mg/dL — ABNORMAL HIGH (ref 70–99)
Glucose-Capillary: 234 mg/dL — ABNORMAL HIGH (ref 70–99)

## 2019-12-31 LAB — COPPER, SERUM: Copper: 128 ug/dL (ref 80–158)

## 2019-12-31 LAB — SARS CORONAVIRUS 2 (TAT 6-24 HRS): SARS Coronavirus 2: NEGATIVE

## 2019-12-31 MED ORDER — THIAMINE HCL 100 MG/ML IJ SOLN
500.0000 mg | Freq: Every day | INTRAVENOUS | Status: DC
  Administered 2019-12-31 – 2020-01-01 (×2): 500 mg via INTRAVENOUS
  Filled 2019-12-31 (×2): qty 5

## 2019-12-31 NOTE — Care Management Important Message (Signed)
Important Message  Patient Details IM Letter given to Ezekiel Ina RN Case Manager to present to the Patient Name: Mary Swanson MRN: [id number] Date of Birth: 1956/11/17   Medicare Important Message Given:  Yes     Caren Macadam 12/31/2019, 10:06 AM

## 2019-12-31 NOTE — Progress Notes (Signed)
Physical Therapy Treatment Patient Details Name: Mary Swanson MRN: [id number] DOB: Jan 05, 1956 Today's Date: 12/31/2019    History of Present Illness 64 yo female admitted with hyperglycemic state, AMS. Pt was found outside walking in her underwear. Hx of obesity, memory loss, anemia, DM    PT Comments    Pt has progressed well with mobility since evaluation day. She is overall Supv-Mod Ind. Per chart review, there were some concerns about pt's home environment. Recommend CM/SW consult to investigate home situation in case pt has to return there.     Follow Up Recommendations  SNF? (needs CM/SW to investigate home situation. Pt has progressed well with mobility since eval day. May need to consider ALF placement at some point)     Equipment Recommendations  None recommended by PT    Recommendations for Other Services       Precautions / Restrictions Precautions Precautions: Fall Restrictions Weight Bearing Restrictions: No    Mobility  Bed Mobility Overal bed mobility: Modified Independent                Transfers Overall transfer level: Modified independent     Sit to Stand: Modified independent (Device/Increase time)            Ambulation/Gait Ambulation/Gait assistance: Supervision Gait Distance (Feet): 750 Feet Assistive device: None Gait Pattern/deviations: Decreased stride length     General Gait Details: No LOB on today. Pt tolerated distance well.   Stairs             Wheelchair Mobility    Modified Rankin (Stroke Patients Only)       Balance Overall balance assessment: Mild deficits observed, not formally tested           Standing balance-Leahy Scale: Fair                              Cognition Arousal/Alertness: Awake/alert Behavior During Therapy: WFL for tasks assessed/performed Overall Cognitive Status: No family/caregiver present to determine baseline cognitive functioning                                  General Comments: improved cognition over last couple of sessions      Exercises      General Comments        Pertinent Vitals/Pain Pain Assessment: No/denies pain    Home Living                      Prior Function            PT Goals (current goals can now be found in the care plan section) Progress towards PT goals: Progressing toward goals    Frequency    Min 3X/week      PT Plan Current plan remains appropriate    Co-evaluation              AM-PAC PT "6 Clicks" Mobility   Outcome Measure  Help needed turning from your back to your side while in a flat bed without using bedrails?: None Help needed moving from lying on your back to sitting on the side of a flat bed without using bedrails?: None Help needed moving to and from a bed to a chair (including a wheelchair)?: None Help needed standing up from a chair using your arms (e.g., wheelchair or bedside chair)?: None Help needed to walk  in hospital room?: A Little Help needed climbing 3-5 steps with a railing? : A Little 6 Click Score: 22    End of Session   Activity Tolerance: Patient tolerated treatment well Patient left: in bed;with call bell/phone within reach;with bed alarm set   PT Visit Diagnosis: Unsteadiness on feet (R26.81)     Time: 1610-9604 PT Time Calculation (min) (ACUTE ONLY): 25 min  Charges:  $Gait Training: 23-37 mins              Faye Ramsay, PT Acute Rehabilitation

## 2019-12-31 NOTE — Progress Notes (Signed)
PROGRESS NOTE    Mary Swanson  IRC:[id number] DOB: 08-31-1956 DOA: 12/27/2019 PCP: Gerlene Fee, DO    Brief Narrative:  Patient is a 64 yo female with a history of diabetes mellitus type 2, poorly controlled, noncompliance, obesity, hypertension, hyperlipidemia presented to ED with altered mental status.  Patient was found outside walking in her underwear in nearly freezing temperatures.  Neighbor called the EMS.  EMS was able to assess her living conditions and found to be very poor.  Per neighbors, she may have a history of drug abuse.  She was found to have high blood sugar by EMS and was brought to ED. Case management has been involved in assessing the living conditions and were deemed unsafe.  APS report has been filed.   Assessment & Plan:   Principal Problem:   Hyperosmolar hyperglycemic state (HHS) ([place]) Active Problems:   Hyperlipidemia   OBESITY, NOS   AKI (acute kidney injury) ([place])   Dehydration   Acute metabolic encephalopathy   Diabetes mellitus ([place])   Essential hypertension  #1 hyperosmolar hyperglycemic state/poorly controlled diabetes mellitus type 2 Hemoglobin A1c 11.9.  Patient noted to be noncompliant.  Patient with probable underlying dementia and not sure as to whether patient is actually taking the insulin like she is supposed to at home.  Patient alert to self and place only.  It was noted on admission that patient had unhygienic living status at home with APS involved.  Patient initially had to be placed on the insulin drip and transition to subcutaneous insulin.  Patient noted to have had some episodes of hypoglycemia overnight on 12/29/2019 and as such Lantus dose was decreased.  CBG of 174 this morning.  Continue current dose of Lantus 18 units daily.  Continue meal coverage insulin 3 units 3 times daily as well as sliding scale insulin.  Patient to be discharged to SNF will likely be managing patient's insulin at that time.  Follow.  2.   Hyperlipidemia Statin on hold due to elevated CK levels.  Follow.  3.  Acute metabolic encephalopathy Patient likely with underlying dementia.  Patient denies any alcohol use or substance use.  Ammonia level within normal limits.  Vitamin B12 levels and RBC folate levels within normal limits.  TSH at 1.03.  Vitamin B1 level pending.  Serum potassium level within normal limits at 28.9.  Copper level at 128.  Patient with no signs of infection at this time.  Will place on thiamine 500 mg IV daily x3 doses for now pending vitamin B-1 levels.  CT head with findings of cerebral atrophy.  MRI brain with no acute or subacute infarction noted.  Supportive care.  4.  Obesity  5.  Hypertension Continue current regimen of Norvasc.   DVT prophylaxis: Lovenox Code Status: Full Family Communication: Updated patient.  No family at bedside. Disposition Plan:  . Patient came from: Home            . Anticipated d/c place: SNF . Barriers to d/c OR conditions which need to be met to effect a safe d/c: Skilled nursing facility when bed available hopefully tomorrow 01/01/2020   Consultants:   None  Procedures:   CT head 12/27/2019  Chest x-ray 12/28/2019, 12/27/2019  MRI brain 12/28/2019  Antimicrobials:   None   Subjective: Patient alert to self and place.  Not sure what year it is.  Denies any chest pain or shortness of breath.  Asking whether she can go washed up in the bathroom.  Objective:  Vitals:   12/31/19 0607 12/31/19 0842 12/31/19 1157 12/31/19 2055  BP: 136/70  (!) 119/56 (!) 140/57  Pulse: 72  83 86  Resp: _0 Temp: 98.5 F (36.9 C)  97.7 F (36.5 C) 99 F (37.2 C)  TempSrc: Oral  Oral Oral  SpO2: 96% 94% 97% 97%  Weight:      Height:        Intake/Output Summary (Last 24 hours) at 12/31/2019 2117 Last data filed at 12/31/2019 1345 Gross per 24 hour  Intake 400 ml  Output 1500 ml  Net -1100 ml   Filed Weights   12/27/19 2324  Weight: 75.1 kg     Examination:  General exam: Appears calm and comfortable  Respiratory system: Clear to auscultation. Respiratory effort normal. Cardiovascular system: S1 & S2 heard, RRR. No JVD, murmurs, rubs, gallops or clicks. No pedal edema. Gastrointestinal system: Abdomen is nondistended, soft and nontender. No organomegaly or masses felt. Normal bowel sounds heard. Central nervous system: Alert and oriented. No focal neurological deficits. Extremities: Symmetric 5 x 5 power. Skin: No rashes, lesions or ulcers Psychiatry: Judgement and insight appear poor. Mood & affect appropriate.     Data Reviewed: I have personally reviewed following labs and imaging studies  CBC: Recent Labs  Lab 12/27/19 1720 12/29/19 0454 12/30/19 0318 12/31/19 0325  WBC 9.5 6.4 5.2 6.1  NEUTROABS 8.5*  --   --   --   HGB 13.1 10.8* 11.0* 10.5*  HCT 42.6 34.6* 34.6* 33.6*  MCV 94.7 93.5 92.8 93.9  PLT 321 242 249 048   Basic Metabolic Panel: Recent Labs  Lab 12/27/19 1917 12/27/19 2222 12/28/19 0600 12/28/19 1218 12/29/19 0454 12/30/19 0318 12/31/19 0325  NA  --    < > 151* 149* 139 136 139  K  --    < > 3.2* 3.6 3.6 4.2 3.9  CL  --    < > 115* 114* 106 102 104  CO2  --    < > _1 GLUCOSE  --    < > 225* 74 93 265* 192*  BUN  --    < > _2 CREATININE  --    < > 0.58 0.78 0.65 0.76 0.74  CALCIUM  --    < > 9.0 9.5 8.7* 8.9 9.0  MG 2.6*  --  2.0  --   --   --   --   PHOS 5.1*  --  3.5  --   --   --   --    < > = values in this interval not displayed.   GFR: Estimated Creatinine Clearance: 63.4 mL/min (by C-G formula based on SCr of 0.74 mg/dL). Liver Function Tests: Recent Labs  Lab 12/27/19 1720  AST 52*  ALT 28  ALKPHOS 105  BILITOT 0.9  PROT 8.1  ALBUMIN 4.3   Recent Labs  Lab 12/27/19 1720  LIPASE 19   Recent Labs  Lab 12/28/19 0846  AMMONIA 25   Coagulation Profile: No results for input(s): INR, PROTIME in the last 168 hours. Cardiac  Enzymes: Recent Labs  Lab 12/27/19 1917 12/28/19 0600 12/29/19 0454  CKTOTAL 1,948* 782* 507*   BNP (last 3 results) No results for input(s): PROBNP in the last 8760 hours. HbA1C: No results for input(s): HGBA1C in the last 72 hours. CBG: Recent Labs  Lab 12/30/19 1645 12/30/19 2156 12/31/19 0741 12/31/19 1133 12/31/19 1626  GLUCAP  206* 230* 174* 317* 155*   Lipid Profile: No results for input(s): CHOL, HDL, LDLCALC, TRIG, CHOLHDL, LDLDIRECT in the last 72 hours. Thyroid Function Tests: No results for input(s): TSH, T4TOTAL, FREET4, T3FREE, THYROIDAB in the last 72 hours. Anemia Panel: No results for input(s): VITAMINB12, FOLATE, FERRITIN, TIBC, IRON, RETICCTPCT in the last 72 hours. Sepsis Labs: No results for input(s): PROCALCITON, LATICACIDVEN in the last 168 hours.  Recent Results (from the past 240 hour(s))  Urine culture     Status: Abnormal   Collection Time: 12/27/19  4:35 PM   Specimen: Urine, Random  Result Value Ref Range Status   Specimen Description   Final    URINE, RANDOM Performed at [facility] [address] [place], [place] [address]    Special Requests   Final    NONE Performed at [facility], [place] [address], [place], [place] [address]    Culture MULTIPLE SPECIES PRESENT, SUGGEST RECOLLECTION (A)  Final   Report Status 12/29/2019 FINAL  Final  Respiratory Panel by RT PCR (Flu A&B, Covid) - Nasopharyngeal Swab     Status: None   Collection Time: 12/27/19  5:45 PM   Specimen: Nasopharyngeal Swab  Result Value Ref Range Status   SARS Coronavirus 2 by RT PCR NEGATIVE NEGATIVE Final    Comment: (NOTE) SARS-CoV-2 target nucleic acids are NOT DETECTED. The SARS-CoV-2 RNA is generally detectable in upper respiratoy specimens during the acute phase of infection. The lowest concentration of SARS-CoV-2 viral copies this assay can detect is 131 copies/mL. A negative result does not preclude SARS-Cov-2 infection  and should not be used as the sole basis for treatment or other patient management decisions. A negative result may occur with  improper specimen collection/handling, submission of specimen other than nasopharyngeal swab, presence of viral mutation(s) within the areas targeted by this assay, and inadequate number of viral copies (<131 copies/mL). A negative result must be combined with clinical observations, patient history, and epidemiological information. The expected result is Negative. Fact Sheet for Patients:  PinkCheek.be Fact Sheet for Healthcare Providers:  GravelBags.it This test is not yet ap proved or cleared by the [place] FDA and  has been authorized for detection and/or diagnosis of SARS-CoV-2 by FDA under an Emergency Use Authorization (EUA). This EUA will remain  in effect (meaning this test can be used) for the duration of the COVID-19 declaration under Section 564(b)(1) of the Act, 21 U.S.C. section 360bbb-3(b)(1), unless the authorization is terminated or revoked sooner.    Influenza A by PCR NEGATIVE NEGATIVE Final   Influenza B by PCR NEGATIVE NEGATIVE Final    Comment: (NOTE) The Xpert Xpress SARS-CoV-2/FLU/RSV assay is intended as an aid in  the diagnosis of influenza from Nasopharyngeal swab specimens and  should not be used as a sole basis for treatment. Nasal washings and  aspirates are unacceptable for Xpert Xpress SARS-CoV-2/FLU/RSV  testing. Fact Sheet for Patients: PinkCheek.be Fact Sheet for Healthcare Providers: GravelBags.it This test is not yet approved or cleared by the [place] FDA and  has been authorized for detection and/or diagnosis of SARS-CoV-2 by  FDA under an Emergency Use Authorization (EUA). This EUA will remain  in effect (meaning this test can be used) for the duration of the  Covid-19 declaration under Section  564(b)(1) of the Act, 21  U.S.C. section 360bbb-3(b)(1), unless the authorization is  terminated or revoked. Performed at [facility], [place] [address], [place], [place] [address]  MRSA PCR Screening     Status: Abnormal   Collection Time: 12/27/19  9:37 PM   Specimen: Nasal Mucosa; Nasopharyngeal  Result Value Ref Range Status   MRSA by PCR POSITIVE (A) NEGATIVE Final    Comment:        The GeneXpert MRSA Assay (FDA approved for NASAL specimens only), is one component of a comprehensive MRSA colonization surveillance program. It is not intended to diagnose MRSA infection nor to guide or monitor treatment for MRSA infections. RESULT CALLED TO, READ BACK BY AND VERIFIED WITH: WEAVER, RN ON 12/28/19 @ 0058 BY LE Performed at [facility] [address] [place], [place] [address]   SARS CORONAVIRUS 2 (TAT 6-24 HRS) Nasopharyngeal Nasopharyngeal Swab     Status: None   Collection Time: 12/31/19 10:22 AM   Specimen: Nasopharyngeal Swab  Result Value Ref Range Status   SARS Coronavirus 2 NEGATIVE NEGATIVE Final    Comment: (NOTE) SARS-CoV-2 target nucleic acids are NOT DETECTED. The SARS-CoV-2 RNA is generally detectable in upper and lower respiratory specimens during the acute phase of infection. Negative results do not preclude SARS-CoV-2 infection, do not rule out co-infections with other pathogens, and should not be used as the sole basis for treatment or other patient management decisions. Negative results must be combined with clinical observations, patient history, and epidemiological information. The expected result is Negative. Fact Sheet for Patients: SugarRoll.be Fact Sheet for Healthcare Providers: https://www.woods-mathews.com/ This test is not yet approved or cleared by the [place] FDA and  has been authorized for detection and/or diagnosis of SARS-CoV-2 by FDA under an Emergency  Use Authorization (EUA). This EUA will remain  in effect (meaning this test can be used) for the duration of the COVID-19 declaration under Section 56 4(b)(1) of the Act, 21 U.S.C. section 360bbb-3(b)(1), unless the authorization is terminated or revoked sooner. Performed at [facility], [place] [address] [place], [place] [address]          Radiology Studies: No results found.      Scheduled Meds: . amLODipine  10 mg Oral Daily  . aspirin  81 mg Oral Daily  . enoxaparin (LOVENOX) injection  40 mg Subcutaneous Q24H  . feeding supplement (PRO-STAT SUGAR FREE 64)  30 mL Oral BID  . insulin aspart  0-5 Units Subcutaneous QHS  . insulin aspart  0-9 Units Subcutaneous TID WC  . insulin aspart  3 Units Subcutaneous TID WC  . insulin glargine  18 Units Subcutaneous QHS  . multivitamin with minerals  1 tablet Oral Daily  . mupirocin ointment   Nasal BID  . Ensure Max Protein  11 oz Oral BID  . thiamine  100 mg Oral Daily   Continuous Infusions:   LOS: 3 days    Time spent: 35 minutes    Irine Seal, MD [facility]   To contact the attending provider between 7A-7P or the covering provider during after hours 7P-7A, please log into the web site [email or web address] and access using universal [place] password for that web site. If you do not have the password, please call the hospital operator.  12/31/2019, 9:17 PM

## 2019-12-31 NOTE — Plan of Care (Signed)
  Problem: Education: Goal: Knowledge of General Education information will improve Description: Including pain rating scale, medication(s)/side effects and non-pharmacologic comfort measures Outcome: Progressing   Problem: Health Behavior/Discharge Planning: Goal: Ability to manage health-related needs will improve Outcome: Progressing   Problem: Clinical Measurements: Goal: Respiratory complications will improve Outcome: Progressing   

## 2019-12-31 NOTE — TOC Progression Note (Signed)
Transition of Care [facility]) - Progression Note    Patient Details  Name: Mary Swanson MRN: [id number] Date of Birth: 1956/06/16  [facility]) CM/SW Contact  Geni Bers, RN Phone Number: 12/31/2019, 11:24 AM  Clinical Narrative:       Expected Discharge Plan: Skilled Nursing Facility Barriers to Discharge: Awaiting State Approval [facility])  Expected Discharge Plan and Services Expected Discharge Plan: Skilled Nursing Facility       Living arrangements for the past 2 months: Single Family Home                                       Social Determinants of Health (SDOH) Interventions    Readmission Risk Interventions No flowsheet data found.

## 2019-12-31 NOTE — Progress Notes (Signed)
Pt Covid test resulted, Care manager updated. SRP,RN

## 2019-12-31 NOTE — TOC Progression Note (Signed)
Transition of Care [facility]) - Progression Note    Patient Details  Name: Mary Swanson MRN: [id number] Date of Birth: 11/03/1956  [facility]) CM/SW Contact  Titianna Loomis, Ervin Knack, [place] Phone Number: 12/31/2019, 1:58 PM  Clinical Narrative:    CSW received a phone call from [place] at [employer], they have approved patient for 3 days Berkley Harvey number is [phone], next review Feb. 5.  CSW updated Olegario Messier at [facility] to contact Eloise Levels [phone] with updated clinicals.  According to [facility] patient can be discharged to their facility once Covid test comes back negative.  CSW updated unit Child psychotherapist.   Expected Discharge Plan: Skilled Nursing Facility Barriers to Discharge: Awaiting State Approval [facility])  Expected Discharge Plan and Services Expected Discharge Plan: Skilled Nursing Facility       Living arrangements for the past 2 months: Single Family Home                                       Social Determinants of Health (SDOH) Interventions    Readmission Risk Interventions No flowsheet data found.

## 2019-12-31 NOTE — Progress Notes (Signed)
Pt does not have IV access and refused to have replaced at present time. Pt on tele and  IV needed per tele protocol only, if pt allows. SRP, RN

## 2020-01-01 ENCOUNTER — Telehealth: Payer: Self-pay

## 2020-01-01 DIAGNOSIS — E1165 Type 2 diabetes mellitus with hyperglycemia: Secondary | ICD-10-CM | POA: Diagnosis not present

## 2020-01-01 DIAGNOSIS — E162 Hypoglycemia, unspecified: Secondary | ICD-10-CM | POA: Diagnosis not present

## 2020-01-01 DIAGNOSIS — M6281 Muscle weakness (generalized): Secondary | ICD-10-CM | POA: Diagnosis not present

## 2020-01-01 DIAGNOSIS — R279 Unspecified lack of coordination: Secondary | ICD-10-CM | POA: Diagnosis not present

## 2020-01-01 DIAGNOSIS — R9389 Abnormal findings on diagnostic imaging of other specified body structures: Secondary | ICD-10-CM | POA: Diagnosis not present

## 2020-01-01 DIAGNOSIS — R410 Disorientation, unspecified: Secondary | ICD-10-CM | POA: Diagnosis not present

## 2020-01-01 DIAGNOSIS — E1100 Type 2 diabetes mellitus with hyperosmolarity without nonketotic hyperglycemic-hyperosmolar coma (NKHHC): Secondary | ICD-10-CM | POA: Diagnosis not present

## 2020-01-01 DIAGNOSIS — N179 Acute kidney failure, unspecified: Secondary | ICD-10-CM | POA: Diagnosis not present

## 2020-01-01 DIAGNOSIS — E161 Other hypoglycemia: Secondary | ICD-10-CM | POA: Diagnosis not present

## 2020-01-01 DIAGNOSIS — E785 Hyperlipidemia, unspecified: Secondary | ICD-10-CM | POA: Diagnosis not present

## 2020-01-01 DIAGNOSIS — G9341 Metabolic encephalopathy: Secondary | ICD-10-CM | POA: Diagnosis not present

## 2020-01-01 DIAGNOSIS — G9340 Encephalopathy, unspecified: Secondary | ICD-10-CM | POA: Diagnosis not present

## 2020-01-01 DIAGNOSIS — Z743 Need for continuous supervision: Secondary | ICD-10-CM | POA: Diagnosis not present

## 2020-01-01 DIAGNOSIS — R2681 Unsteadiness on feet: Secondary | ICD-10-CM | POA: Diagnosis not present

## 2020-01-01 DIAGNOSIS — I10 Essential (primary) hypertension: Secondary | ICD-10-CM | POA: Diagnosis not present

## 2020-01-01 LAB — CBC
HCT: 34.7 % — ABNORMAL LOW (ref 36.0–46.0)
Hemoglobin: 10.4 g/dL — ABNORMAL LOW (ref 12.0–15.0)
MCH: 28.6 pg (ref 26.0–34.0)
MCHC: 30 g/dL (ref 30.0–36.0)
MCV: 95.3 fL (ref 80.0–100.0)
Platelets: 254 10*3/uL (ref 150–400)
RBC: 3.64 MIL/uL — ABNORMAL LOW (ref 3.87–5.11)
RDW: 13.2 % (ref 11.5–15.5)
WBC: 7.4 10*3/uL (ref 4.0–10.5)
nRBC: 0.3 % — ABNORMAL HIGH (ref 0.0–0.2)

## 2020-01-01 LAB — BASIC METABOLIC PANEL
Anion gap: 8 (ref 5–15)
BUN: 29 mg/dL — ABNORMAL HIGH (ref 8–23)
CO2: 27 mmol/L (ref 22–32)
Calcium: 9.2 mg/dL (ref 8.9–10.3)
Chloride: 104 mmol/L (ref 98–111)
Creatinine, Ser: 0.62 mg/dL (ref 0.44–1.00)
GFR calc Af Amer: >60 mL/min (ref >60)
GFR calc non Af Amer: >60 mL/min (ref >60)
Glucose, Bld: 166 mg/dL — ABNORMAL HIGH (ref 70–99)
Potassium: 4 mmol/L (ref 3.5–5.1)
Sodium: 139 mmol/L (ref 135–145)

## 2020-01-01 LAB — GLUCOSE, CAPILLARY
Glucose-Capillary: 108 mg/dL — ABNORMAL HIGH (ref 70–99)
Glucose-Capillary: 278 mg/dL — ABNORMAL HIGH (ref 70–99)

## 2020-01-01 MED ORDER — ENSURE MAX PROTEIN PO LIQD: 11.0000 [oz_av] | Freq: Two times a day (BID) | ORAL | Status: DC

## 2020-01-01 MED ORDER — THIAMINE HCL 100 MG PO TABS: 100.0000 mg | ORAL_TABLET | Freq: Every day | ORAL | Status: DC

## 2020-01-01 MED ORDER — INSULIN ASPART 100 UNIT/ML ~~LOC~~ SOLN: 3.0000 [IU] | Freq: Three times a day (TID) | SUBCUTANEOUS | 0 refills | Status: DC

## 2020-01-01 MED ORDER — AMLODIPINE BESYLATE 10 MG PO TABS: 10.0000 mg | ORAL_TABLET | Freq: Every day | ORAL | 1 refills | Status: AC

## 2020-01-01 MED ORDER — MUPIROCIN 2 % EX OINT: TOPICAL_OINTMENT | Freq: Two times a day (BID) | CUTANEOUS | 0 refills | Status: DC

## 2020-01-01 MED ORDER — LANTUS SOLOSTAR 100 UNIT/ML ~~LOC~~ SOPN: 18.0000 [IU] | PEN_INJECTOR | Freq: Every day | SUBCUTANEOUS | 0 refills | Status: DC

## 2020-01-01 MED ORDER — PRO-STAT SUGAR FREE PO LIQD: 30.0000 mL | Freq: Two times a day (BID) | ORAL | 0 refills | Status: DC

## 2020-01-01 MED ORDER — ADULT MULTIVITAMIN W/MINERALS CH: 1.0000 | ORAL_TABLET | Freq: Every day | ORAL | Status: AC
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
## 2020-01-01 NOTE — Telephone Encounter (Signed)
LVM for pt to call our office. If pt calls, please ask COVID screening questions. Jemuel Laursen T Michayla Mcneil, CMA  

## 2020-01-01 NOTE — Progress Notes (Signed)
Pt leaving for [facility], Called report to [place]. SRP, RN

## 2020-01-01 NOTE — TOC Progression Note (Signed)
Transition of Care [facility]) - Progression Note    Patient Details  Name: Mary Swanson MRN: [id number] Date of Birth: 06/22/1956  Transition of Care [facility]) CM/SW Contact  Geni Bers, RN Phone Number: 01/01/2020, 11:52 AM  Clinical Narrative:    Transportation (PTAR) was called. RN aware.    Expected Discharge Plan: Skilled Nursing Facility Barriers to Discharge: Awaiting State Approval [facility])  Expected Discharge Plan and Services Expected Discharge Plan: Skilled Nursing Facility       Living arrangements for the past 2 months: Single Family Home Expected Discharge Date: 01/01/20                                     Social Determinants of Health (SDOH) Interventions    Readmission Risk Interventions No flowsheet data found.

## 2020-01-02 ENCOUNTER — Ambulatory Visit: Payer: Medicare Other | Admitting: Family Medicine

## 2020-01-02 DIAGNOSIS — Z09 Encounter for follow-up examination after completed treatment for conditions other than malignant neoplasm: Secondary | ICD-10-CM | POA: Insufficient documentation

## 2020-01-02 LAB — VITAMIN B1: Vitamin B1 (Thiamine): 237.8 nmol/L — ABNORMAL HIGH (ref 66.5–200.0)

## 2020-01-02 NOTE — Progress Notes (Deleted)
  Patient Name: Thirza Pellicano Date of Birth: 08-01-1956 Date of Visit: 01/02/20 PCP: Lavonda Jumbo, DO  Chief Complaint:   Subjective: Quandra Fedorchak is a pleasant 64 y.o. with medical history significant for *** presenting today for ***.     ROS: Per HPI.   I have reviewed the patient's medical, surgical, family, and social history as appropriate.  There were no vitals filed for this visit.  ***  No problem-specific Assessment & Plan notes found for this encounter.    Return to care in ***.   Lavonda Jumbo, DO [facility] PGY-1

## 2020-01-08 DIAGNOSIS — E785 Hyperlipidemia, unspecified: Secondary | ICD-10-CM | POA: Diagnosis not present

## 2020-01-08 DIAGNOSIS — I10 Essential (primary) hypertension: Secondary | ICD-10-CM | POA: Diagnosis not present

## 2020-01-08 DIAGNOSIS — G9340 Encephalopathy, unspecified: Secondary | ICD-10-CM | POA: Diagnosis not present

## 2020-01-08 DIAGNOSIS — E1165 Type 2 diabetes mellitus with hyperglycemia: Secondary | ICD-10-CM | POA: Diagnosis not present

## 2020-01-20 DIAGNOSIS — E1165 Type 2 diabetes mellitus with hyperglycemia: Secondary | ICD-10-CM | POA: Diagnosis not present

## 2020-01-20 DIAGNOSIS — Z9119 Patient's noncompliance with other medical treatment and regimen: Secondary | ICD-10-CM | POA: Diagnosis not present

## 2020-01-20 DIAGNOSIS — G9341 Metabolic encephalopathy: Secondary | ICD-10-CM | POA: Diagnosis not present

## 2020-02-10 DIAGNOSIS — I10 Essential (primary) hypertension: Secondary | ICD-10-CM | POA: Diagnosis not present

## 2020-02-10 DIAGNOSIS — G9341 Metabolic encephalopathy: Secondary | ICD-10-CM | POA: Diagnosis not present

## 2020-02-10 DIAGNOSIS — E785 Hyperlipidemia, unspecified: Secondary | ICD-10-CM | POA: Diagnosis not present

## 2020-02-10 DIAGNOSIS — E1165 Type 2 diabetes mellitus with hyperglycemia: Secondary | ICD-10-CM | POA: Diagnosis not present

## 2020-02-10 DIAGNOSIS — R451 Restlessness and agitation: Secondary | ICD-10-CM | POA: Diagnosis not present

## 2020-02-11 DIAGNOSIS — E559 Vitamin D deficiency, unspecified: Secondary | ICD-10-CM | POA: Diagnosis not present

## 2020-02-11 DIAGNOSIS — E785 Hyperlipidemia, unspecified: Secondary | ICD-10-CM | POA: Diagnosis not present

## 2020-02-11 DIAGNOSIS — R5383 Other fatigue: Secondary | ICD-10-CM | POA: Diagnosis not present

## 2020-02-11 DIAGNOSIS — E1165 Type 2 diabetes mellitus with hyperglycemia: Secondary | ICD-10-CM | POA: Diagnosis not present

## 2020-02-11 DIAGNOSIS — Z5181 Encounter for therapeutic drug level monitoring: Secondary | ICD-10-CM | POA: Diagnosis not present

## 2020-02-12 ENCOUNTER — Ambulatory Visit: Payer: Medicare Other

## 2020-02-13 DIAGNOSIS — E559 Vitamin D deficiency, unspecified: Secondary | ICD-10-CM | POA: Diagnosis not present

## 2020-02-13 DIAGNOSIS — E1165 Type 2 diabetes mellitus with hyperglycemia: Secondary | ICD-10-CM | POA: Diagnosis not present

## 2020-02-13 DIAGNOSIS — E785 Hyperlipidemia, unspecified: Secondary | ICD-10-CM | POA: Diagnosis not present

## 2020-02-16 DIAGNOSIS — R451 Restlessness and agitation: Secondary | ICD-10-CM | POA: Diagnosis not present

## 2020-04-12 DIAGNOSIS — G9340 Encephalopathy, unspecified: Secondary | ICD-10-CM | POA: Diagnosis not present

## 2020-04-12 DIAGNOSIS — E1165 Type 2 diabetes mellitus with hyperglycemia: Secondary | ICD-10-CM | POA: Diagnosis not present

## 2020-04-12 DIAGNOSIS — Z9119 Patient's noncompliance with other medical treatment and regimen: Secondary | ICD-10-CM | POA: Diagnosis not present

## 2020-04-15 DIAGNOSIS — I10 Essential (primary) hypertension: Secondary | ICD-10-CM | POA: Diagnosis not present

## 2020-04-15 DIAGNOSIS — E1165 Type 2 diabetes mellitus with hyperglycemia: Secondary | ICD-10-CM | POA: Diagnosis not present

## 2020-06-02 DIAGNOSIS — E1165 Type 2 diabetes mellitus with hyperglycemia: Secondary | ICD-10-CM | POA: Diagnosis not present

## 2020-06-02 DIAGNOSIS — I10 Essential (primary) hypertension: Secondary | ICD-10-CM | POA: Diagnosis not present

## 2020-06-02 DIAGNOSIS — E785 Hyperlipidemia, unspecified: Secondary | ICD-10-CM | POA: Diagnosis not present

## 2020-06-25 DIAGNOSIS — E1165 Type 2 diabetes mellitus with hyperglycemia: Secondary | ICD-10-CM | POA: Diagnosis not present

## 2020-06-30 DIAGNOSIS — E119 Type 2 diabetes mellitus without complications: Secondary | ICD-10-CM | POA: Diagnosis not present

## 2020-06-30 DIAGNOSIS — Z7984 Long term (current) use of oral hypoglycemic drugs: Secondary | ICD-10-CM | POA: Diagnosis not present

## 2020-06-30 DIAGNOSIS — Z794 Long term (current) use of insulin: Secondary | ICD-10-CM | POA: Diagnosis not present

## 2020-06-30 DIAGNOSIS — Z961 Presence of intraocular lens: Secondary | ICD-10-CM | POA: Diagnosis not present

## 2020-07-05 DIAGNOSIS — E785 Hyperlipidemia, unspecified: Secondary | ICD-10-CM | POA: Diagnosis not present

## 2020-07-05 DIAGNOSIS — E1165 Type 2 diabetes mellitus with hyperglycemia: Secondary | ICD-10-CM | POA: Diagnosis not present

## 2020-07-05 DIAGNOSIS — I10 Essential (primary) hypertension: Secondary | ICD-10-CM | POA: Diagnosis not present

## 2020-07-05 DIAGNOSIS — G9341 Metabolic encephalopathy: Secondary | ICD-10-CM | POA: Diagnosis not present

## 2020-07-07 DIAGNOSIS — E1165 Type 2 diabetes mellitus with hyperglycemia: Secondary | ICD-10-CM | POA: Diagnosis not present

## 2020-07-07 DIAGNOSIS — I10 Essential (primary) hypertension: Secondary | ICD-10-CM | POA: Diagnosis not present

## 2020-07-07 DIAGNOSIS — E785 Hyperlipidemia, unspecified: Secondary | ICD-10-CM | POA: Diagnosis not present

## 2020-07-12 DIAGNOSIS — I10 Essential (primary) hypertension: Secondary | ICD-10-CM | POA: Diagnosis not present

## 2020-07-12 DIAGNOSIS — Z9181 History of falling: Secondary | ICD-10-CM | POA: Diagnosis not present

## 2020-07-12 DIAGNOSIS — E119 Type 2 diabetes mellitus without complications: Secondary | ICD-10-CM | POA: Diagnosis not present

## 2020-07-12 DIAGNOSIS — Z794 Long term (current) use of insulin: Secondary | ICD-10-CM | POA: Diagnosis not present

## 2020-07-12 DIAGNOSIS — E785 Hyperlipidemia, unspecified: Secondary | ICD-10-CM | POA: Diagnosis not present

## 2020-07-13 DIAGNOSIS — I10 Essential (primary) hypertension: Secondary | ICD-10-CM | POA: Diagnosis not present

## 2020-07-13 DIAGNOSIS — Z9181 History of falling: Secondary | ICD-10-CM | POA: Diagnosis not present

## 2020-07-13 DIAGNOSIS — E119 Type 2 diabetes mellitus without complications: Secondary | ICD-10-CM | POA: Diagnosis not present

## 2020-07-13 DIAGNOSIS — E785 Hyperlipidemia, unspecified: Secondary | ICD-10-CM | POA: Diagnosis not present

## 2020-07-13 DIAGNOSIS — Z794 Long term (current) use of insulin: Secondary | ICD-10-CM | POA: Diagnosis not present

## 2020-07-19 DIAGNOSIS — E785 Hyperlipidemia, unspecified: Secondary | ICD-10-CM | POA: Diagnosis not present

## 2020-07-19 DIAGNOSIS — I10 Essential (primary) hypertension: Secondary | ICD-10-CM | POA: Diagnosis not present

## 2020-07-19 DIAGNOSIS — Z9181 History of falling: Secondary | ICD-10-CM | POA: Diagnosis not present

## 2020-07-19 DIAGNOSIS — E119 Type 2 diabetes mellitus without complications: Secondary | ICD-10-CM | POA: Diagnosis not present

## 2020-07-19 DIAGNOSIS — Z794 Long term (current) use of insulin: Secondary | ICD-10-CM | POA: Diagnosis not present

## 2020-07-22 DIAGNOSIS — I10 Essential (primary) hypertension: Secondary | ICD-10-CM | POA: Diagnosis not present

## 2020-07-22 DIAGNOSIS — E785 Hyperlipidemia, unspecified: Secondary | ICD-10-CM | POA: Diagnosis not present

## 2020-07-22 DIAGNOSIS — E1165 Type 2 diabetes mellitus with hyperglycemia: Secondary | ICD-10-CM | POA: Diagnosis not present

## 2020-07-26 DIAGNOSIS — Z9181 History of falling: Secondary | ICD-10-CM | POA: Diagnosis not present

## 2020-07-26 DIAGNOSIS — E119 Type 2 diabetes mellitus without complications: Secondary | ICD-10-CM | POA: Diagnosis not present

## 2020-07-26 DIAGNOSIS — I10 Essential (primary) hypertension: Secondary | ICD-10-CM | POA: Diagnosis not present

## 2020-07-26 DIAGNOSIS — Z794 Long term (current) use of insulin: Secondary | ICD-10-CM | POA: Diagnosis not present

## 2020-07-26 DIAGNOSIS — E785 Hyperlipidemia, unspecified: Secondary | ICD-10-CM | POA: Diagnosis not present

## 2020-08-03 DIAGNOSIS — Z794 Long term (current) use of insulin: Secondary | ICD-10-CM | POA: Diagnosis not present

## 2020-08-03 DIAGNOSIS — E785 Hyperlipidemia, unspecified: Secondary | ICD-10-CM | POA: Diagnosis not present

## 2020-08-03 DIAGNOSIS — E119 Type 2 diabetes mellitus without complications: Secondary | ICD-10-CM | POA: Diagnosis not present

## 2020-08-03 DIAGNOSIS — I10 Essential (primary) hypertension: Secondary | ICD-10-CM | POA: Diagnosis not present

## 2020-08-03 DIAGNOSIS — Z9181 History of falling: Secondary | ICD-10-CM | POA: Diagnosis not present

## 2020-09-01 DIAGNOSIS — E1165 Type 2 diabetes mellitus with hyperglycemia: Secondary | ICD-10-CM | POA: Diagnosis not present

## 2020-09-01 DIAGNOSIS — Z794 Long term (current) use of insulin: Secondary | ICD-10-CM | POA: Diagnosis not present

## 2020-10-02 ENCOUNTER — Emergency Department (HOSPITAL_COMMUNITY): Payer: Medicare Other

## 2020-10-02 ENCOUNTER — Emergency Department (HOSPITAL_COMMUNITY)
Admission: EM | Admit: 2020-10-02 | Discharge: 2020-10-02 | Disposition: A | Discharge status: Other Acute Inpatient Hospital | Payer: Medicare Other | Attending: Emergency Medicine | Admitting: Emergency Medicine

## 2020-10-02 DIAGNOSIS — E1165 Type 2 diabetes mellitus with hyperglycemia: Secondary | ICD-10-CM | POA: Diagnosis not present

## 2020-10-02 DIAGNOSIS — Z91040 Latex allergy status: Secondary | ICD-10-CM | POA: Diagnosis not present

## 2020-10-02 DIAGNOSIS — Z794 Long term (current) use of insulin: Secondary | ICD-10-CM | POA: Diagnosis not present

## 2020-10-02 DIAGNOSIS — Z7982 Long term (current) use of aspirin: Secondary | ICD-10-CM | POA: Insufficient documentation

## 2020-10-02 DIAGNOSIS — Z79899 Other long term (current) drug therapy: Secondary | ICD-10-CM | POA: Diagnosis not present

## 2020-10-02 DIAGNOSIS — R413 Other amnesia: Secondary | ICD-10-CM | POA: Diagnosis not present

## 2020-10-02 DIAGNOSIS — R6889 Other general symptoms and signs: Secondary | ICD-10-CM | POA: Diagnosis not present

## 2020-10-02 DIAGNOSIS — F0390 Unspecified dementia without behavioral disturbance: Secondary | ICD-10-CM | POA: Diagnosis not present

## 2020-10-02 DIAGNOSIS — S0990XA Unspecified injury of head, initial encounter: Secondary | ICD-10-CM | POA: Diagnosis not present

## 2020-10-02 DIAGNOSIS — M47812 Spondylosis without myelopathy or radiculopathy, cervical region: Secondary | ICD-10-CM | POA: Diagnosis not present

## 2020-10-02 DIAGNOSIS — M542 Cervicalgia: Secondary | ICD-10-CM | POA: Diagnosis not present

## 2020-10-02 DIAGNOSIS — S199XXA Unspecified injury of neck, initial encounter: Secondary | ICD-10-CM | POA: Diagnosis not present

## 2020-10-02 DIAGNOSIS — R0902 Hypoxemia: Secondary | ICD-10-CM | POA: Diagnosis not present

## 2020-10-02 DIAGNOSIS — I10 Essential (primary) hypertension: Secondary | ICD-10-CM | POA: Insufficient documentation

## 2020-10-02 DIAGNOSIS — Z743 Need for continuous supervision: Secondary | ICD-10-CM | POA: Diagnosis not present

## 2020-10-02 DIAGNOSIS — M47813 Spondylosis without myelopathy or radiculopathy, cervicothoracic region: Secondary | ICD-10-CM | POA: Diagnosis not present
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
## 2020-10-02 NOTE — ED Notes (Signed)
Pt's sister arrived to transport pt back to [facility]. [facility] made aware to take pt off list. Pt ambulatory out of ED to sister car. Pt's sister was updated regarding the patients care. RN reviewed discharge papers with sister prior to leaving.
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
## 2020-10-02 NOTE — Discharge Instructions (Signed)
If she develops any new or worsening symptoms you can always bring her back to the emergency department for reevaluation.
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
## 2020-10-02 NOTE — ED Notes (Signed)
[facility] contacted regarding patient transport.  

## 2020-10-13 DIAGNOSIS — R451 Restlessness and agitation: Secondary | ICD-10-CM | POA: Diagnosis not present

## 2020-10-26 DIAGNOSIS — R451 Restlessness and agitation: Secondary | ICD-10-CM | POA: Diagnosis not present

## 2020-12-08 DIAGNOSIS — U071 COVID-19: Secondary | ICD-10-CM | POA: Diagnosis not present

## 2020-12-15 DIAGNOSIS — U071 COVID-19: Secondary | ICD-10-CM | POA: Diagnosis not present

## 2020-12-22 DIAGNOSIS — L03116 Cellulitis of left lower limb: Secondary | ICD-10-CM | POA: Diagnosis not present

## 2021-01-26 DIAGNOSIS — E1165 Type 2 diabetes mellitus with hyperglycemia: Secondary | ICD-10-CM | POA: Diagnosis not present

## 2021-01-26 DIAGNOSIS — Z794 Long term (current) use of insulin: Secondary | ICD-10-CM | POA: Diagnosis not present

## 2021-02-02 DIAGNOSIS — Z794 Long term (current) use of insulin: Secondary | ICD-10-CM | POA: Diagnosis not present

## 2021-02-02 DIAGNOSIS — E1165 Type 2 diabetes mellitus with hyperglycemia: Secondary | ICD-10-CM | POA: Diagnosis not present

## 2021-02-03 DIAGNOSIS — E785 Hyperlipidemia, unspecified: Secondary | ICD-10-CM | POA: Diagnosis not present

## 2021-02-03 DIAGNOSIS — E1165 Type 2 diabetes mellitus with hyperglycemia: Secondary | ICD-10-CM | POA: Diagnosis not present

## 2021-02-03 DIAGNOSIS — M6281 Muscle weakness (generalized): Secondary | ICD-10-CM | POA: Diagnosis not present

## 2021-02-03 DIAGNOSIS — I10 Essential (primary) hypertension: Secondary | ICD-10-CM | POA: Diagnosis not present

## 2021-02-09 DIAGNOSIS — E1165 Type 2 diabetes mellitus with hyperglycemia: Secondary | ICD-10-CM | POA: Diagnosis not present

## 2021-02-09 DIAGNOSIS — Z794 Long term (current) use of insulin: Secondary | ICD-10-CM | POA: Diagnosis not present

## 2021-04-05 DIAGNOSIS — E119 Type 2 diabetes mellitus without complications: Secondary | ICD-10-CM | POA: Diagnosis not present

## 2021-06-07 DIAGNOSIS — I10 Essential (primary) hypertension: Secondary | ICD-10-CM | POA: Diagnosis not present

## 2021-06-07 DIAGNOSIS — E1165 Type 2 diabetes mellitus with hyperglycemia: Secondary | ICD-10-CM | POA: Diagnosis not present

## 2021-06-08 DIAGNOSIS — E1165 Type 2 diabetes mellitus with hyperglycemia: Secondary | ICD-10-CM | POA: Diagnosis not present

## 2021-06-08 DIAGNOSIS — Z794 Long term (current) use of insulin: Secondary | ICD-10-CM | POA: Diagnosis not present

## 2021-06-15 ENCOUNTER — Other Ambulatory Visit: Payer: Self-pay

## 2021-06-15 ENCOUNTER — Emergency Department (HOSPITAL_COMMUNITY): Payer: Medicare Other

## 2021-06-15 ENCOUNTER — Inpatient Hospital Stay (HOSPITAL_COMMUNITY)
Admission: EM | Admit: 2021-06-15 | Discharge: 2021-06-19 | DRG: 640 | Disposition: A | Discharge status: Nursing Home (Includes ICF) | Payer: Medicare Other | Attending: Internal Medicine | Admitting: Internal Medicine

## 2021-06-15 ENCOUNTER — Encounter (HOSPITAL_COMMUNITY): Payer: Self-pay

## 2021-06-15 DIAGNOSIS — E861 Hypovolemia: Secondary | ICD-10-CM | POA: Diagnosis present

## 2021-06-15 DIAGNOSIS — F0281 Dementia in other diseases classified elsewhere with behavioral disturbance: Secondary | ICD-10-CM | POA: Diagnosis present

## 2021-06-15 DIAGNOSIS — R739 Hyperglycemia, unspecified: Secondary | ICD-10-CM | POA: Diagnosis not present

## 2021-06-15 DIAGNOSIS — R4182 Altered mental status, unspecified: Secondary | ICD-10-CM | POA: Insufficient documentation

## 2021-06-15 DIAGNOSIS — Z1611 Resistance to penicillins: Secondary | ICD-10-CM | POA: Diagnosis not present

## 2021-06-15 DIAGNOSIS — R413 Other amnesia: Secondary | ICD-10-CM | POA: Diagnosis present

## 2021-06-15 DIAGNOSIS — Z8659 Personal history of other mental and behavioral disorders: Secondary | ICD-10-CM

## 2021-06-15 DIAGNOSIS — N390 Urinary tract infection, site not specified: Secondary | ICD-10-CM | POA: Diagnosis not present

## 2021-06-15 DIAGNOSIS — Z9842 Cataract extraction status, left eye: Secondary | ICD-10-CM | POA: Diagnosis not present

## 2021-06-15 DIAGNOSIS — Z20822 Contact with and (suspected) exposure to covid-19: Secondary | ICD-10-CM | POA: Diagnosis not present

## 2021-06-15 DIAGNOSIS — E860 Dehydration: Secondary | ICD-10-CM | POA: Diagnosis not present

## 2021-06-15 DIAGNOSIS — Z91040 Latex allergy status: Secondary | ICD-10-CM | POA: Diagnosis not present

## 2021-06-15 DIAGNOSIS — G309 Alzheimer's disease, unspecified: Secondary | ICD-10-CM | POA: Diagnosis not present

## 2021-06-15 DIAGNOSIS — Z881 Allergy status to other antibiotic agents status: Secondary | ICD-10-CM

## 2021-06-15 DIAGNOSIS — E119 Type 2 diabetes mellitus without complications: Secondary | ICD-10-CM

## 2021-06-15 DIAGNOSIS — I10 Essential (primary) hypertension: Secondary | ICD-10-CM | POA: Diagnosis not present

## 2021-06-15 DIAGNOSIS — F02818 Dementia in other diseases classified elsewhere, unspecified severity, with other behavioral disturbance: Secondary | ICD-10-CM

## 2021-06-15 DIAGNOSIS — G300 Alzheimer's disease with early onset: Secondary | ICD-10-CM | POA: Diagnosis not present

## 2021-06-15 DIAGNOSIS — Z743 Need for continuous supervision: Secondary | ICD-10-CM | POA: Diagnosis not present

## 2021-06-15 DIAGNOSIS — Z9841 Cataract extraction status, right eye: Secondary | ICD-10-CM

## 2021-06-15 DIAGNOSIS — Z7982 Long term (current) use of aspirin: Secondary | ICD-10-CM

## 2021-06-15 DIAGNOSIS — Z87440 Personal history of urinary (tract) infections: Secondary | ICD-10-CM

## 2021-06-15 DIAGNOSIS — Z79899 Other long term (current) drug therapy: Secondary | ICD-10-CM | POA: Diagnosis not present

## 2021-06-15 DIAGNOSIS — E1165 Type 2 diabetes mellitus with hyperglycemia: Secondary | ICD-10-CM | POA: Diagnosis present

## 2021-06-15 DIAGNOSIS — E870 Hyperosmolality and hypernatremia: Principal | ICD-10-CM | POA: Diagnosis present

## 2021-06-15 DIAGNOSIS — Z794 Long term (current) use of insulin: Secondary | ICD-10-CM

## 2021-06-15 DIAGNOSIS — M2550 Pain in unspecified joint: Secondary | ICD-10-CM | POA: Diagnosis not present

## 2021-06-15 DIAGNOSIS — G9341 Metabolic encephalopathy: Secondary | ICD-10-CM | POA: Diagnosis not present

## 2021-06-15 DIAGNOSIS — B9620 Unspecified Escherichia coli [E. coli] as the cause of diseases classified elsewhere: Secondary | ICD-10-CM | POA: Diagnosis present

## 2021-06-15 DIAGNOSIS — E785 Hyperlipidemia, unspecified: Secondary | ICD-10-CM | POA: Diagnosis present

## 2021-06-15 DIAGNOSIS — Z888 Allergy status to other drugs, medicaments and biological substances status: Secondary | ICD-10-CM

## 2021-06-15 DIAGNOSIS — Z7401 Bed confinement status: Secondary | ICD-10-CM | POA: Diagnosis not present

## 2021-06-15 DIAGNOSIS — R6889 Other general symptoms and signs: Secondary | ICD-10-CM | POA: Diagnosis not present

## 2021-06-15 HISTORY — DX: Altered mental status, unspecified: R41.82

## 2021-06-15 LAB — CBC WITH DIFFERENTIAL/PLATELET
Abs Immature Granulocytes: 0.06 10*3/uL (ref 0.00–0.07)
Basophils Absolute: 0 10*3/uL (ref 0.0–0.1)
Basophils Relative: 0 %
Eosinophils Absolute: 0.1 10*3/uL (ref 0.0–0.5)
Eosinophils Relative: 1 %
HCT: 51.2 % — ABNORMAL HIGH (ref 36.0–46.0)
Hemoglobin: 15.4 g/dL — ABNORMAL HIGH (ref 12.0–15.0)
Immature Granulocytes: 1 %
Lymphocytes Relative: 24 %
Lymphs Abs: 2.7 10*3/uL (ref 0.7–4.0)
MCH: 30.4 pg (ref 26.0–34.0)
MCHC: 30.1 g/dL (ref 30.0–36.0)
MCV: 101.2 fL — ABNORMAL HIGH (ref 80.0–100.0)
Monocytes Absolute: 0.6 10*3/uL (ref 0.1–1.0)
Monocytes Relative: 5 %
Neutro Abs: 8 10*3/uL — ABNORMAL HIGH (ref 1.7–7.7)
Neutrophils Relative %: 69 %
Platelets: 239 10*3/uL (ref 150–400)
RBC: 5.06 MIL/uL (ref 3.87–5.11)
RDW: 12.5 % (ref 11.5–15.5)
WBC: 11.4 10*3/uL — ABNORMAL HIGH (ref 4.0–10.5)
nRBC: 0 % (ref 0.0–0.2)

## 2021-06-15 LAB — URINALYSIS, COMPLETE (UACMP) WITH MICROSCOPIC
Bilirubin Urine: NEGATIVE
Glucose, UA: >=500 mg/dL — AB
Hgb urine dipstick: NEGATIVE
Ketones, ur: NEGATIVE mg/dL
Leukocytes,Ua: NEGATIVE
Nitrite: POSITIVE — AB
Protein, ur: 30 mg/dL — AB
Specific Gravity, Urine: 1.028 (ref 1.005–1.030)
pH: 5 (ref 5.0–8.0)

## 2021-06-15 LAB — RESP PANEL BY RT-PCR (FLU A&B, COVID) ARPGX2
Influenza A by PCR: NEGATIVE
Influenza B by PCR: NEGATIVE
SARS Coronavirus 2 by RT PCR: NEGATIVE

## 2021-06-15 LAB — COMPREHENSIVE METABOLIC PANEL
ALT: 22 U/L (ref 0–44)
AST: 25 U/L (ref 15–41)
Albumin: 4.3 g/dL (ref 3.5–5.0)
Alkaline Phosphatase: 90 U/L (ref 38–126)
Anion gap: 8 (ref 5–15)
BUN: 24 mg/dL — ABNORMAL HIGH (ref 8–23)
CO2: 30 mmol/L (ref 22–32)
Calcium: 10.4 mg/dL — ABNORMAL HIGH (ref 8.9–10.3)
Chloride: 118 mmol/L — ABNORMAL HIGH (ref 98–111)
Creatinine, Ser: 0.99 mg/dL (ref 0.44–1.00)
GFR, Estimated: >60 mL/min (ref >60)
Glucose, Bld: 189 mg/dL — ABNORMAL HIGH (ref 70–99)
Potassium: 3.8 mmol/L (ref 3.5–5.1)
Sodium: 156 mmol/L — ABNORMAL HIGH (ref 135–145)
Total Bilirubin: 0.7 mg/dL (ref 0.3–1.2)
Total Protein: 8.6 g/dL — ABNORMAL HIGH (ref 6.5–8.1)

## 2021-06-15 LAB — CBG MONITORING, ED: Glucose-Capillary: 170 mg/dL — ABNORMAL HIGH (ref 70–99)

## 2021-06-15 LAB — GLUCOSE, CAPILLARY: Glucose-Capillary: 66 mg/dL — ABNORMAL LOW (ref 70–99)

## 2021-06-15 IMAGING — CT CT HEAD W/O CM
3 series · 15 of 47 positions shown, 18 images · non-contrast
Comparison: Head CT [DATE]

CLINICAL DATA: Altered mental status.

EXAM:
CT HEAD WITHOUT CONTRAST
TECHNIQUE: Contiguous axial images were obtained from the base of the skull
through the vertex without intravenous contrast.

[Series 3: head wo · axial · 0.47mm/px · z∈[+1402,+1527]mm · 9 of 31 slices shown, 12 images]
[im 3/31  brain]
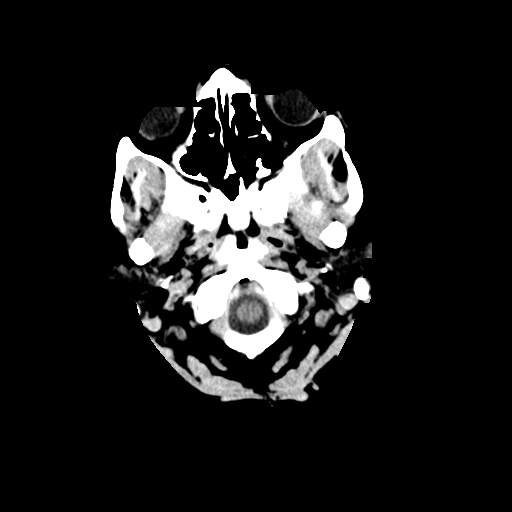
[im 3/31  bone]
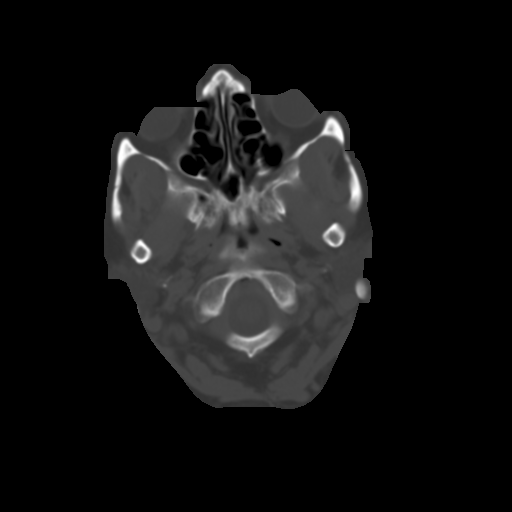
[im 6/31  brain]
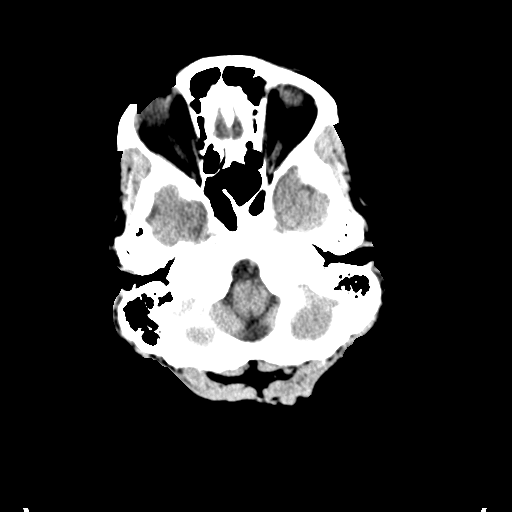
[im 9/31  brain]
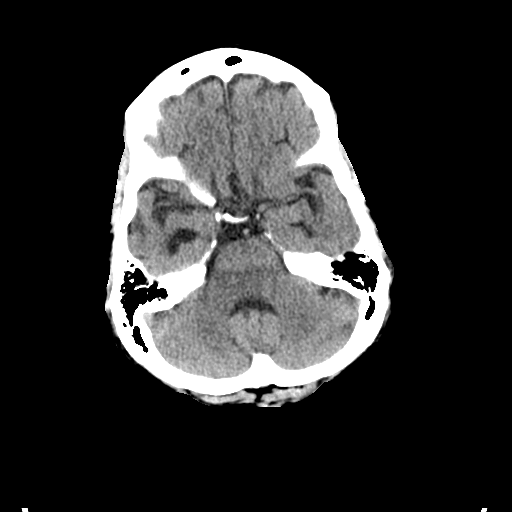
[im 12/31  brain]
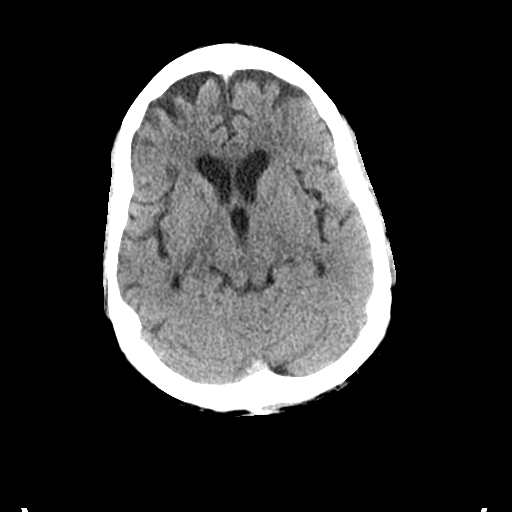
[im 16/31  brain]
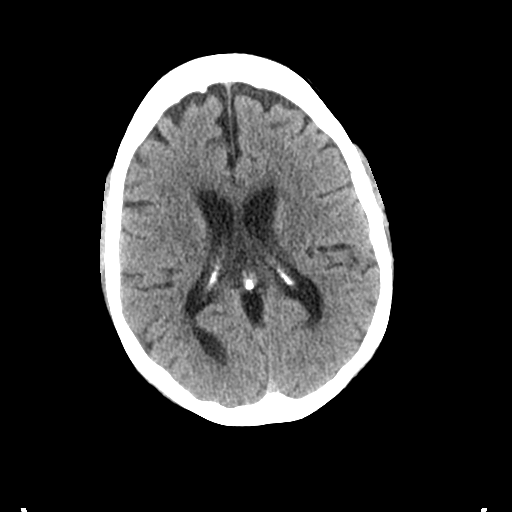
[im 16/31  bone]
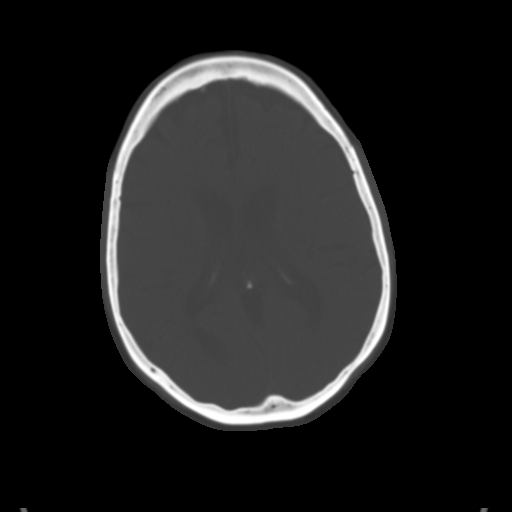
[im 19/31  brain]
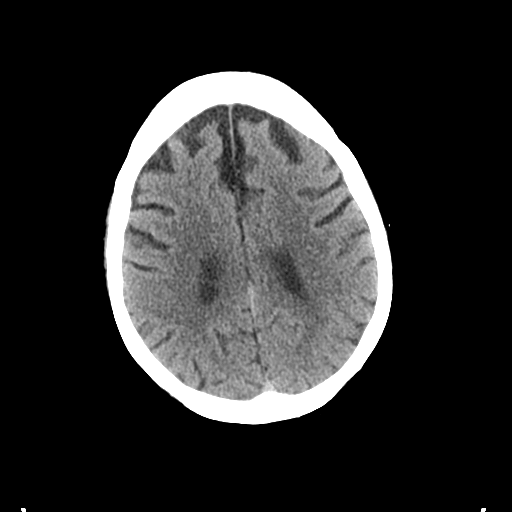
[im 22/31  brain]
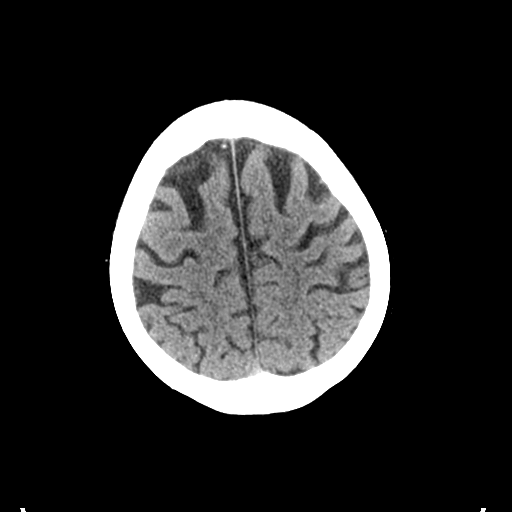
[im 25/31  brain]
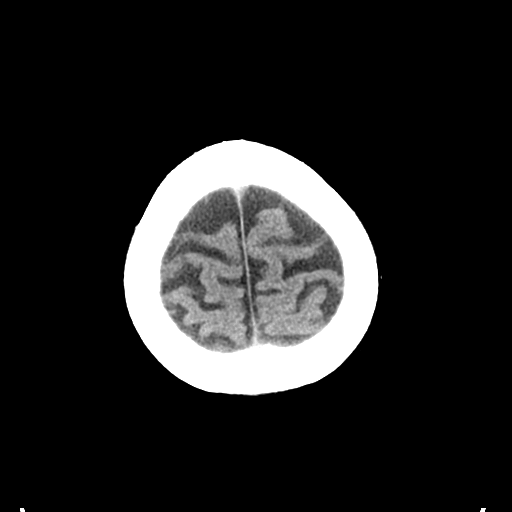
[im 28/31  brain]
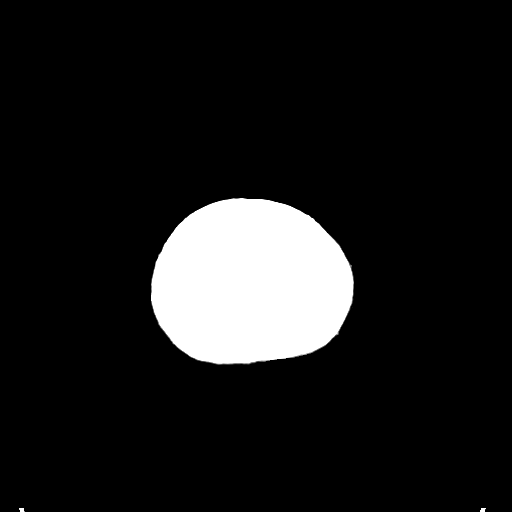
[im 28/31  bone]
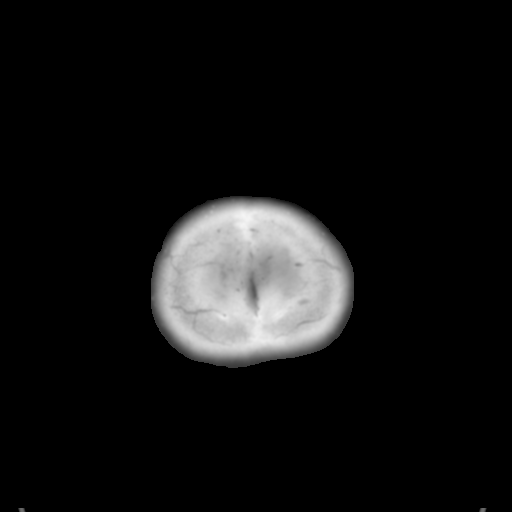

[Series 5: coronal soft tissue · coronal · 0.34mm/px · 3 of 62 slices shown]
[im 21/62  brain]
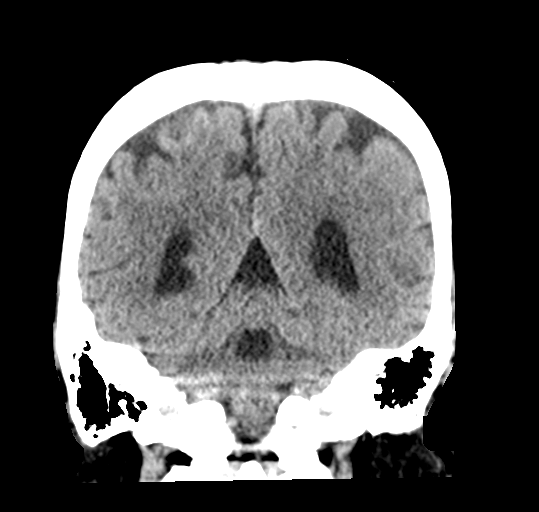
[im 28/62  brain]
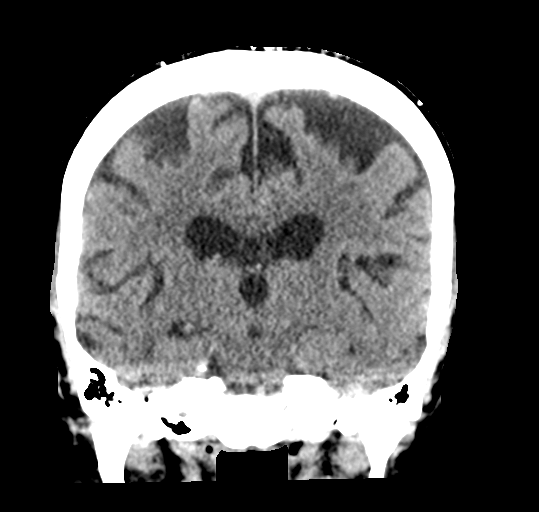
[im 34/62  brain]
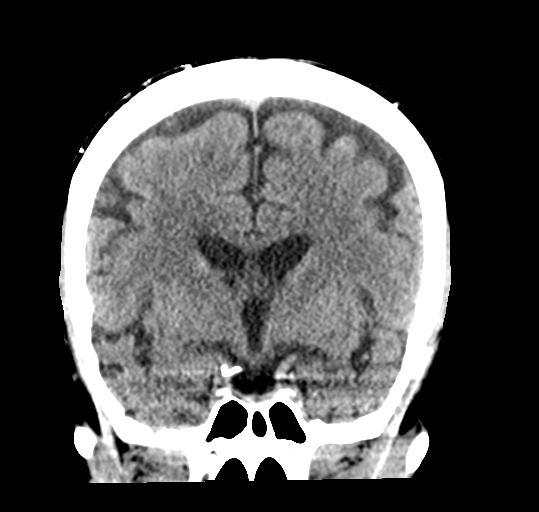

[Series 6: sagittal soft tissue · sagittal · 0.32mm/px · 3 of 50 slices shown]
[im 17/50  brain]
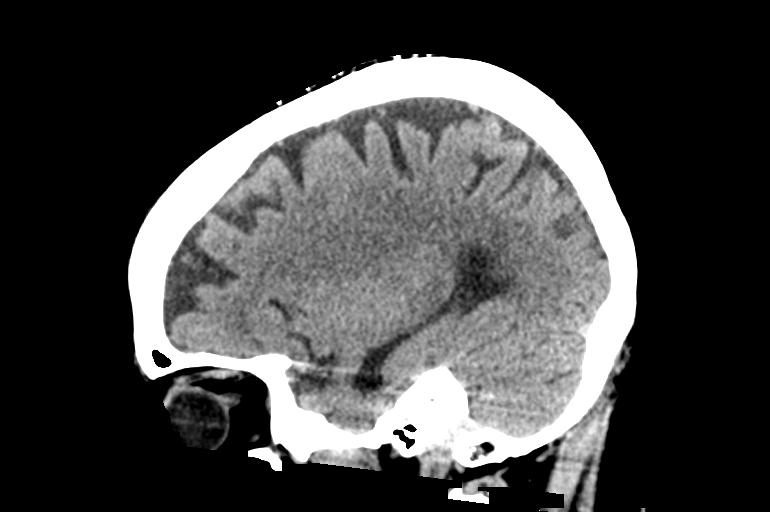
[im 25/50  brain]
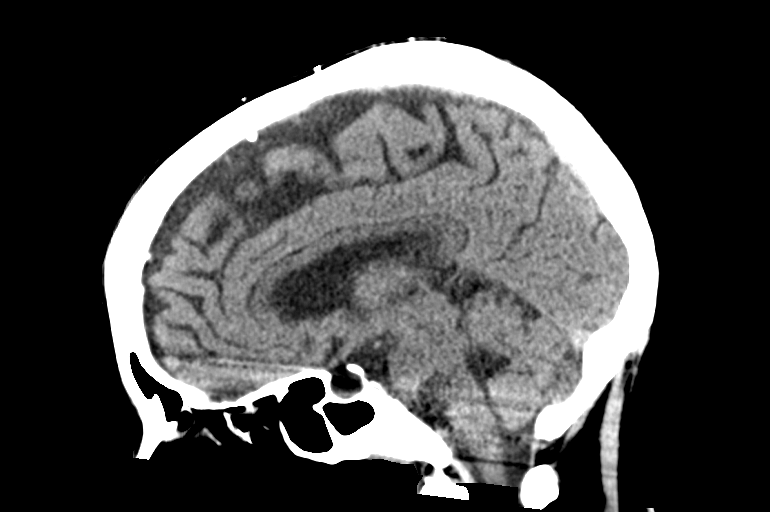
[im 33/50  brain]
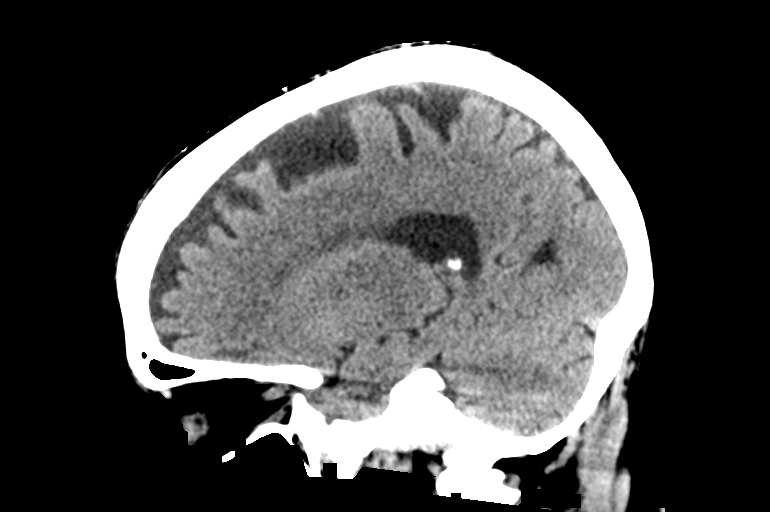

[15 of 47 positions shown; findings below may reference images not displayed]

FINDINGS: Brain: Normal for age atrophy. No intracranial hemorrhage, mass
effect, or midline shift. No hydrocephalus. The basilar cisterns are
patent. No evidence of territorial infarct or acute ischemia. No
extra-axial or intracranial fluid collection.

Vascular: No hyperdense vessel or unexpected calcification.

Skull: No fracture or focal lesion.

Sinuses/Orbits: Paranasal sinuses and mastoid air cells are clear.
The visualized orbits are unremarkable. Bilateral cataract
resection.

Other: None
IMPRESSION: Negative head CT.

## 2021-06-15 IMAGING — CR DG CHEST 2V
2 series · 2 of 2 positions shown · non-contrast
Comparison: [DATE]

CLINICAL DATA: Altered level of consciousness, dementia,
hyperglycemia

EXAM:
CHEST - 2 VIEW

[w chest lat]
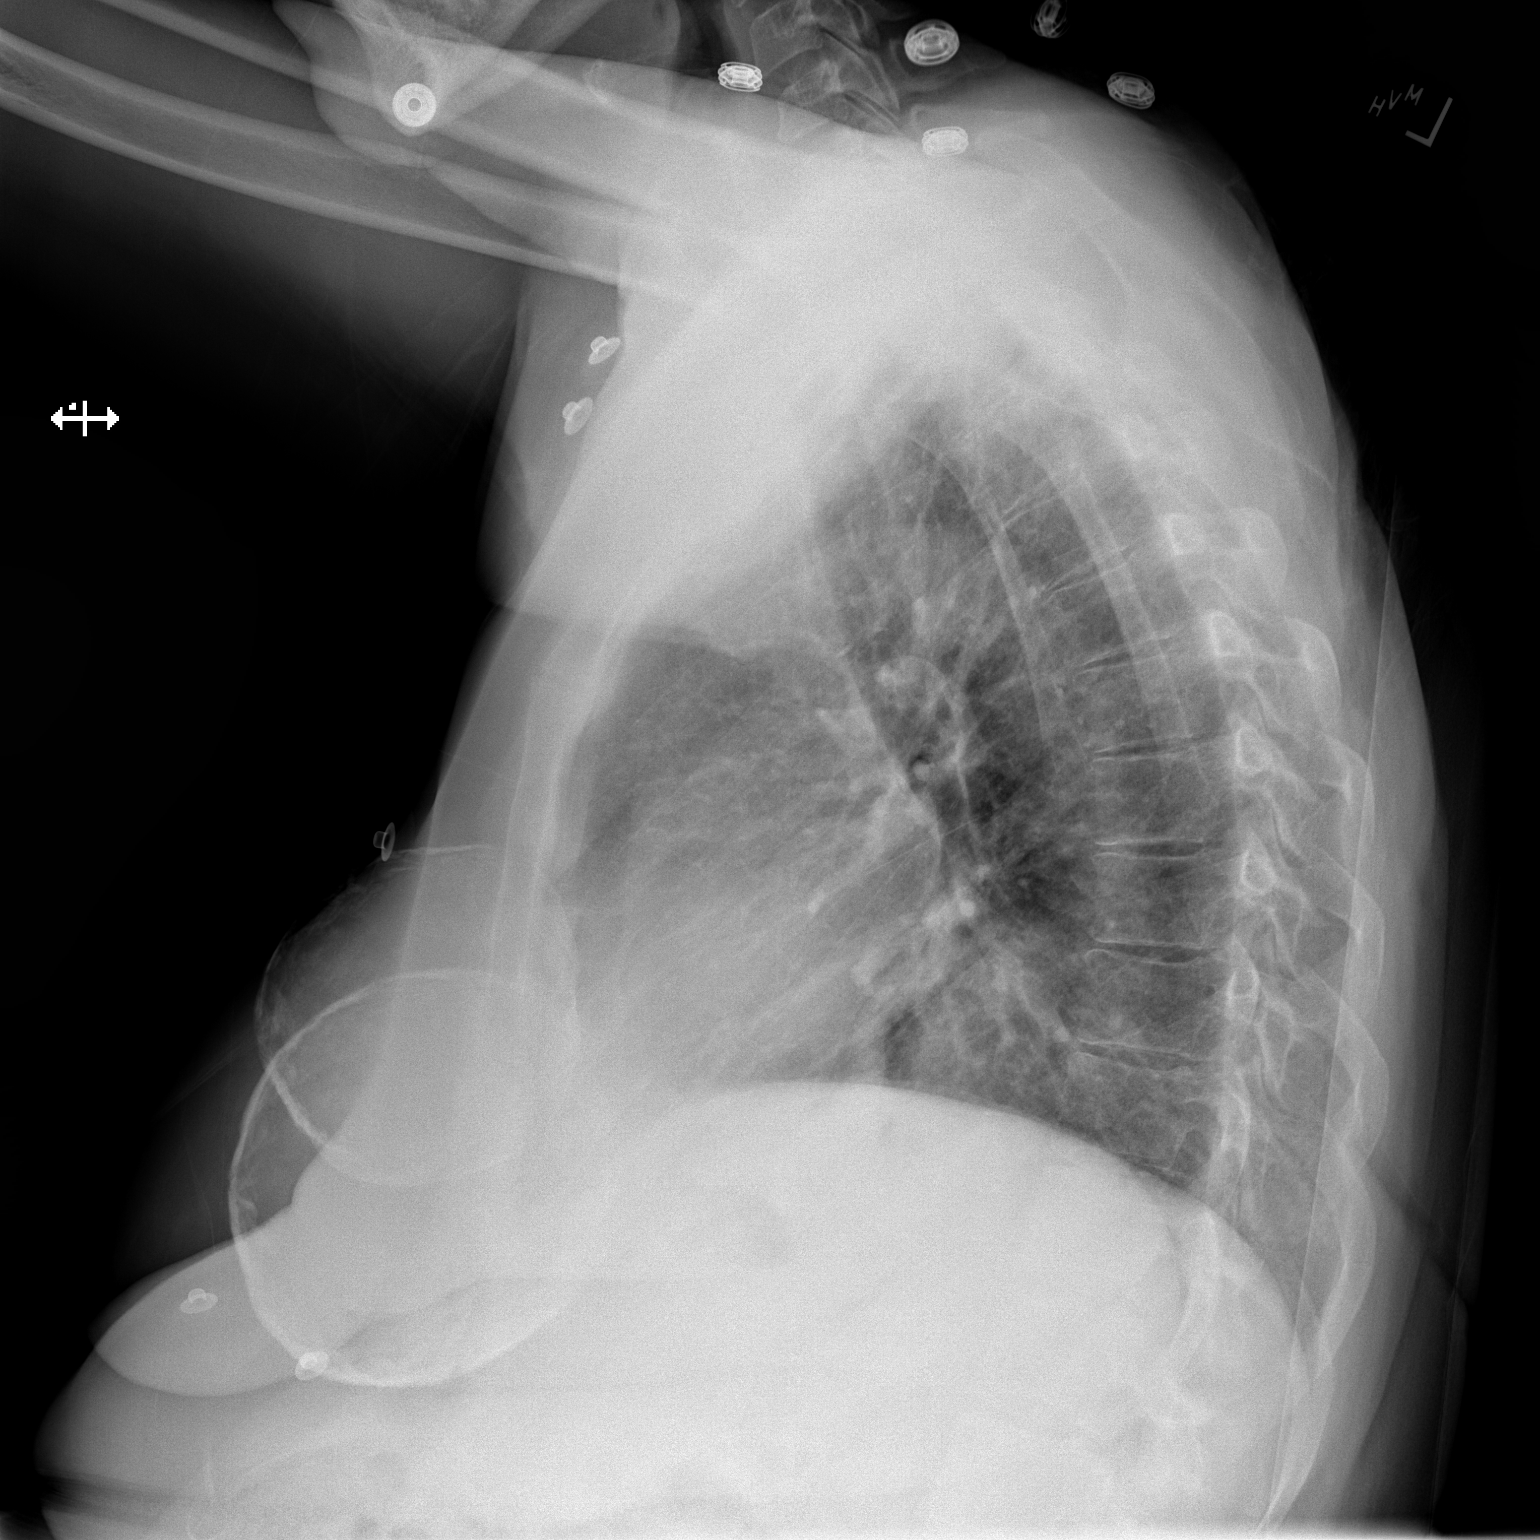

[x chest ap]
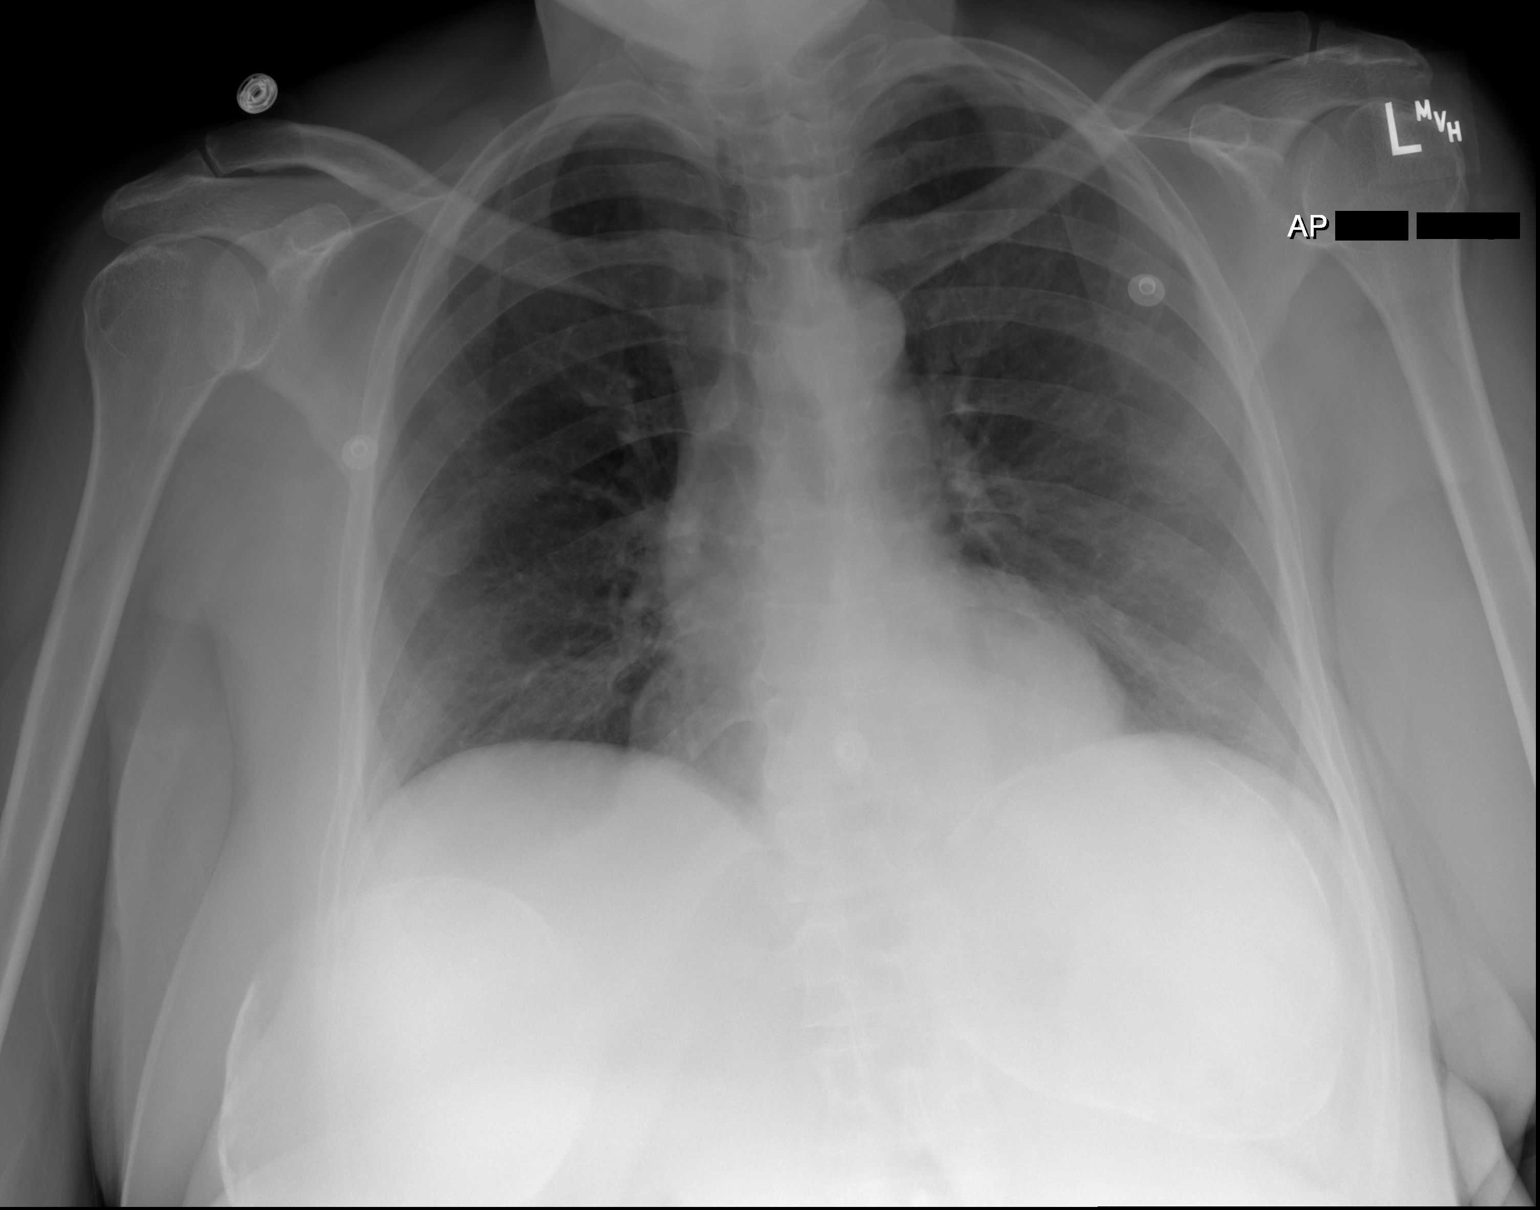

[2 of 2 positions shown; findings below may reference images not displayed]

FINDINGS: Frontal and lateral views of the chest demonstrate an unremarkable
cardiac silhouette. No airspace disease, effusion, or pneumothorax.
No acute bony abnormalities.
IMPRESSION: 1. No acute intrathoracic process.

## 2021-06-15 MED ORDER — SODIUM CHLORIDE 0.9 % IV SOLN
1.0000 g | INTRAVENOUS | Status: DC
  Administered 2021-06-16 – 2021-06-18 (×3): 1 g via INTRAVENOUS
  Filled 2021-06-15: qty 10
  Filled 2021-06-15: qty 1
  Filled 2021-06-15 (×2): qty 10

## 2021-06-15 MED ORDER — BISACODYL 5 MG PO TBEC: 5.0000 mg | DELAYED_RELEASE_TABLET | Freq: Every day | ORAL | Status: DC | PRN

## 2021-06-15 MED ORDER — ONDANSETRON HCL 4 MG/2ML IJ SOLN: 4.0000 mg | Freq: Four times a day (QID) | INTRAMUSCULAR | Status: DC | PRN

## 2021-06-15 MED ORDER — ACETAMINOPHEN 325 MG PO TABS
650.0000 mg | ORAL_TABLET | Freq: Four times a day (QID) | ORAL | Status: DC | PRN
  Administered 2021-06-17 – 2021-06-18 (×2): 650 mg via ORAL
  Filled 2021-06-15 (×2): qty 2

## 2021-06-15 MED ORDER — SODIUM CHLORIDE 0.9 % IV SOLN: INTRAVENOUS | Status: DC

## 2021-06-15 MED ORDER — INSULIN GLARGINE 100 UNIT/ML ~~LOC~~ SOLN
26.0000 [IU] | Freq: Every day | SUBCUTANEOUS | Status: DC
  Filled 2021-06-15: qty 0.26

## 2021-06-15 MED ORDER — ASPIRIN 81 MG PO CHEW
81.0000 mg | CHEWABLE_TABLET | Freq: Every day | ORAL | Status: DC
  Administered 2021-06-16 – 2021-06-19 (×4): 81 mg via ORAL
  Filled 2021-06-15 (×4): qty 1

## 2021-06-15 MED ORDER — THIAMINE HCL 100 MG PO TABS
100.0000 mg | ORAL_TABLET | Freq: Every day | ORAL | Status: DC
  Administered 2021-06-16 – 2021-06-19 (×4): 100 mg via ORAL
  Filled 2021-06-15 (×4): qty 1

## 2021-06-15 MED ORDER — ACETAMINOPHEN 650 MG RE SUPP: 650.0000 mg | Freq: Four times a day (QID) | RECTAL | Status: DC | PRN

## 2021-06-15 MED ORDER — MAGNESIUM CITRATE PO SOLN: 1.0000 | Freq: Once | ORAL | Status: DC | PRN

## 2021-06-15 MED ORDER — SENNOSIDES-DOCUSATE SODIUM 8.6-50 MG PO TABS: 1.0000 | ORAL_TABLET | Freq: Every evening | ORAL | Status: DC | PRN

## 2021-06-15 MED ORDER — ENOXAPARIN SODIUM 40 MG/0.4ML IJ SOSY
40.0000 mg | PREFILLED_SYRINGE | INTRAMUSCULAR | Status: DC
  Administered 2021-06-16 – 2021-06-19 (×4): 40 mg via SUBCUTANEOUS
  Filled 2021-06-15 (×4): qty 0.4

## 2021-06-15 MED ORDER — SODIUM CHLORIDE 0.9 % IV SOLN: Freq: Once | INTRAVENOUS | Status: AC

## 2021-06-15 MED ORDER — SODIUM CHLORIDE 0.9 % IV BOLUS
1000.0000 mL | Freq: Once | INTRAVENOUS | Status: AC
  Administered 2021-06-15: 1000 mL via INTRAVENOUS

## 2021-06-15 MED ORDER — ROSUVASTATIN CALCIUM 20 MG PO TABS
20.0000 mg | ORAL_TABLET | Freq: Every day | ORAL | Status: DC
  Administered 2021-06-16 – 2021-06-18 (×3): 20 mg via ORAL
  Filled 2021-06-15 (×4): qty 1

## 2021-06-15 MED ORDER — AMLODIPINE BESYLATE 10 MG PO TABS
10.0000 mg | ORAL_TABLET | Freq: Every day | ORAL | Status: DC
  Administered 2021-06-16 – 2021-06-19 (×4): 10 mg via ORAL
  Filled 2021-06-15 (×4): qty 1

## 2021-06-15 MED ORDER — ONDANSETRON HCL 4 MG PO TABS: 4.0000 mg | ORAL_TABLET | Freq: Four times a day (QID) | ORAL | Status: DC | PRN

## 2021-06-15 NOTE — ED Provider Notes (Signed)
[facility] DEPT Provider Note   CSN: [id number] Arrival date & time: 06/15/21  1711     History Chief Complaint  Patient presents with   Hyperglycemia   Altered Mental Status    Mary Swanson is a 65 y.o. female.  The history is provided by the nursing home, medical records, the EMS personnel and the patient. The history is limited by the condition of the patient.  Hyperglycemia Associated symptoms: altered mental status   Altered Mental Status  Patient presents with altered mental status.  She is a type II diabetic on insulin, her blood sugars have been running in the 400s all week.  She was given 12 units of NovoLog earlier today because her blood sugar was in the 460s.  She has been sleeping more than normal, also acting confused.  Her normal baseline she is oriented to time and place, but she has not been following instructions as she typically would.  Her nursing home caretaker Kerman Passey says that the last time this happened was when she had a UTI.  She has not had any nausea, vomiting, fevers at the nursing home.  Any changes to medication other than her night insulin being raised 2 units in the last week.  She has not had any head traumas or falls that have been witnessed.  Has not been complaining about any pain anywhere.  Past Medical History:  Diagnosis Date   Diabetes mellitus without complication ([place])    Hypertension     Patient Active Problem List   Diagnosis Date Noted   Hospital discharge follow-up 01/02/2020   Abnormal CXR    Essential hypertension    Acute metabolic encephalopathy 27/25/3664   Diabetes mellitus ([place]) 12/28/2019   Hyperosmolar hyperglycemic state (HHS) ([place]) 12/27/2019   AKI (acute kidney injury) ([place]) 12/27/2019   Dehydration 12/27/2019   Elevated blood pressure reading 12/03/2019   Memory loss of unknown cause 12/03/2019   Hyperglycemia 12/03/2019   ACE-inhibitor cough 03/14/2019   Diabetes mellitus without  complication ([facility])    Heart murmur, systolic 40/34/7425   DIABETES MELLITUS II, UNCOMPLICATED 95/63/8756   Hyperlipidemia 01/24/2007   OBESITY, NOS 01/24/2007   Anemia 01/24/2007   History of depression 01/24/2007   RHINITIS, ALLERGIC 01/24/2007   Type 2 diabetes mellitus with hyperglycemia ([place]) 01/24/2007    Past Surgical History:  Procedure Laterality Date   CATARACT EXTRACTION, BILATERAL Bilateral      OB History   No obstetric history on file.     Family History  Problem Relation Age of Onset   Diabetes Neg Hx     Social History   Tobacco Use   Smoking status: Never   Smokeless tobacco: Never  Vaping Use   Vaping Use: Never used  Substance Use Topics   Alcohol use: Not Currently   Drug use: Never    Home Medications Prior to Admission medications   Medication Sig Start Date End Date Taking? Authorizing Provider  AgaMatrix Ultra-Thin Lancets MISC Check blood glucose 3 times daily before meals and as needed 06/25/19   Mack Hook, MD  Amino Acids-Protein Hydrolys (FEEDING SUPPLEMENT, PRO-STAT SUGAR FREE 64,) LIQD Take 30 mLs by mouth 2 (two) times daily. 01/01/20   Eugenie Filler, MD  amLODipine (NORVASC) 10 MG tablet Take 1 tablet (10 mg total) by mouth daily. 01/02/20   Eugenie Filler, MD  aspirin 81 MG chewable tablet Chew 81 mg by mouth daily.     [provider]  Blood Glucose  Monitoring Suppl (AGAMATRIX PRESTO) w/Device KIT Check blood glucose three times daily before meals and as needed 06/25/19   Mack Hook, MD  Ensure Max Protein (ENSURE MAX PROTEIN) LIQD Take 330 mLs (11 oz total) by mouth 2 (two) times daily. 01/01/20   Eugenie Filler, MD  Ferrous Sulfate (SLOW FE) 142 (45 Fe) MG TBCR Take 1 tablet by mouth daily.    [provider]  glucose 4 GM chewable tablet Chew 1 tablet by mouth as needed for low blood sugar.    [provider]  glucose blood (AGAMATRIX PRESTO TEST) test strip Check blood glucose 3  times daily before meals and as needed 06/25/19   Mack Hook, MD  insulin aspart (NOVOLOG) 100 UNIT/ML injection Inject 3 Units into the skin 3 (three) times daily with meals. 01/01/20   Eugenie Filler, MD  Insulin Glargine (LANTUS SOLOSTAR) 100 UNIT/ML Solostar Pen Inject 18 Units into the skin daily. 01/01/20   Eugenie Filler, MD  Insulin Pen Needle (BD PEN NEEDLE NANO U/F) 32G X 4 MM MISC 1 Stick by Does not apply route every morning. 06/05/17   Eloise Levels, MD  Multiple Vitamin (MULTIVITAMIN WITH MINERALS) TABS tablet Take 1 tablet by mouth daily. 01/02/20   Eugenie Filler, MD  mupirocin ointment (BACTROBAN) 2 % Place into the nose 2 (two) times daily. 01/01/20   Eugenie Filler, MD  thiamine 100 MG tablet Take 1 tablet (100 mg total) by mouth daily. 01/01/20   Eugenie Filler, MD    Allergies    Erythromycin, Latex, and Lisinopril  Review of Systems   Review of Systems  Unable to perform ROS: Mental status change   Physical Exam Updated Vital Signs BP (!) 156/78   Pulse 87   Temp 97.9 F (36.6 C) (Oral)   Resp 16   LMP  (LMP Unknown)   SpO2 96%   Physical Exam Vitals and nursing note reviewed. Exam conducted with a chaperone present.  Constitutional:      General: She is not in acute distress.    Appearance: Normal appearance. She is well-developed.  HENT:     Head: Normocephalic and atraumatic.  Eyes:     General: No scleral icterus.       Right eye: No discharge.        Left eye: No discharge.     Extraocular Movements: Extraocular movements intact.     Conjunctiva/sclera: Conjunctivae normal.     Pupils: Pupils are equal, round, and reactive to light.  Cardiovascular:     Rate and Rhythm: Normal rate and regular rhythm.     Pulses: Normal pulses.     Heart sounds: Normal heart sounds. No murmur heard.   No friction rub. No gallop.  Pulmonary:     Effort: Pulmonary effort is normal. No respiratory distress.     Breath sounds: Normal breath  sounds.  Abdominal:     General: Abdomen is flat. Bowel sounds are normal. There is no distension.     Palpations: Abdomen is soft.     Tenderness: There is no abdominal tenderness.  Musculoskeletal:     Cervical back: Neck supple.  Skin:    General: Skin is warm and dry.     Coloration: Skin is not jaundiced.  Neurological:     Mental Status: She is alert. She is confused.     Coordination: Coordination normal.     Comments: Patient is not oriented to where she is.  When asked questions she starts to answer but trails off distracted.  She is confused, does not follow commands so unable to do a full cranial nerve exam.    ED Results / Procedures / Treatments   Labs (all labs ordered are listed, but only abnormal results are displayed) Labs Reviewed - No data to display  EKG None  Radiology No results found.  Procedures Procedures   Medications Ordered in ED Medications - No data to display  ED Course  I have reviewed the triage vital signs and the nursing notes.  Pertinent labs & imaging results that were available during my care of the patient were reviewed by me and considered in my medical decision making (see chart for details).  Clinical Course as of 06/15/21 2008  Wed Jun 15, 2021  1952 Sodium(!): 156 Patient is very hyponatremic.  This is probably the cause of her confusion.  We have the patient admitted. [HS]    Clinical Course User Index [HS] Sherrill Raring, PA-C   MDM Rules/Calculators/A&P                           Patient is mildly hypertensive, but otherwise vitals are stable.  Her labs show hypernatremia, I suspect this is likely her cause of confusion.  Her imaging came back unremarkable.  We will give her fluids and get her admitted to the hospital.  Discussed HPI, physical exam and plan of care for this patient with attending Vivi Martens. The attending physician evaluated this patient as part of a shared visit and agrees with plan of care.   Spoke  with hospitalist Hugelmeyer, Ubaldo Glassing  who is happy to admit the patient.  Final Clinical Impression(s) / ED Diagnoses Final diagnoses:  None    Rx / DC Orders ED Discharge Orders     None        Sonnie Alamo 06/15/21 2008    Lacretia Leigh, MD 06/22/21 1252

## 2021-06-15 NOTE — ED Notes (Signed)
IP handoff completed with Kennith Maes, RN via secure chat

## 2021-06-15 NOTE — ED Provider Notes (Signed)
I provided a substantive portion of the care of this patient.  I personally performed the entirety of the medical decision making for this encounter.      65 year old female with history of dementia presents due to altered mental status.  Patient hyponatremic at baseline 156.  Likely dehydration.  Will give IV fluids and patient admitted to the hospital   Lorre Nick, MD 06/15/21 [phone]

## 2021-06-15 NOTE — H&P (Addendum)
History and Physical   [facility] [place] Admission History and Physical [employer], D.O.    Patient Name: Mary Swanson MR#: [id number] Date of Birth: Oct 05, 1956 Date of Admission: 06/15/2021  Referring MD/NP/PA: Dr. Zenia Resides Primary Care Physician: Gerlene Fee, DO  Chief Complaint:  Chief Complaint  Patient presents with   Hyperglycemia   Altered Mental Status  Please note the entire history is obtained from the patient's emergency department chart, emergency department provider.Patient's personal history is limited by dementia and altered mental status.   HPI: Mary Swanson is a 65 y.o. female with a known history of DM, HTN, dementia, resident of nursing facility presents to the emergency department for evaluation of AMS.  Patient resides in a nursing care facility where the staff reports that she has been sleeping more than usual, confused, less interactive.  Her blood sugars have been greater than 400 all week.  Her insulin has been increased in dosage in the past week.  Nursing indicates similar symptoms with previous urinary tract infection.   EMS/ED Course: Patient received NS. Medical admission has been requested for further management of hypernatremia 2/2 dehydration..  Review of Systems:  Unable to obtain 2/2 dementia, AMS.   Past Medical History:  Diagnosis Date   Diabetes mellitus without complication ([place])    Hypertension     Past Surgical History:  Procedure Laterality Date   CATARACT EXTRACTION, BILATERAL Bilateral      reports that she has never smoked. She has never used smokeless tobacco. She reports previous alcohol use. She reports that she does not use drugs.  Allergies  Allergen Reactions   Erythromycin Diarrhea   Latex Hives   Lisinopril Cough    Family History  Problem Relation Age of Onset   Diabetes Neg Hx     Prior to Admission medications   Medication Sig Start Date End Date Taking? Authorizing  Provider  AgaMatrix Ultra-Thin Lancets MISC Check blood glucose 3 times daily before meals and as needed 06/25/19   Mack Hook, MD  Amino Acids-Protein Hydrolys (FEEDING SUPPLEMENT, PRO-STAT SUGAR FREE 64,) LIQD Take 30 mLs by mouth 2 (two) times daily. 01/01/20   Eugenie Filler, MD  amLODipine (NORVASC) 10 MG tablet Take 1 tablet (10 mg total) by mouth daily. 01/02/20   Eugenie Filler, MD  aspirin 81 MG chewable tablet Chew 81 mg by mouth daily.     [provider]  Blood Glucose Monitoring Suppl (AGAMATRIX PRESTO) w/Device KIT Check blood glucose three times daily before meals and as needed 06/25/19   Mack Hook, MD  Ensure Max Protein (ENSURE MAX PROTEIN) LIQD Take 330 mLs (11 oz total) by mouth 2 (two) times daily. 01/01/20   Eugenie Filler, MD  Ferrous Sulfate (SLOW FE) 142 (45 Fe) MG TBCR Take 1 tablet by mouth daily.    [provider]  glucose 4 GM chewable tablet Chew 1 tablet by mouth as needed for low blood sugar.    [provider]  glucose blood (AGAMATRIX PRESTO TEST) test strip Check blood glucose 3 times daily before meals and as needed 06/25/19   Mack Hook, MD  insulin aspart (NOVOLOG) 100 UNIT/ML injection Inject 3 Units into the skin 3 (three) times daily with meals. 01/01/20   Eugenie Filler, MD  Insulin Glargine (LANTUS SOLOSTAR) 100 UNIT/ML Solostar Pen Inject 18 Units into the skin daily. 01/01/20   Eugenie Filler, MD  Insulin Pen Needle (BD PEN NEEDLE NANO U/F)  32G X 4 MM MISC 1 Stick by Does not apply route every morning. 06/05/17   Eloise Levels, MD  Multiple Vitamin (MULTIVITAMIN WITH MINERALS) TABS tablet Take 1 tablet by mouth daily. 01/02/20   Eugenie Filler, MD  mupirocin ointment (BACTROBAN) 2 % Place into the nose 2 (two) times daily. 01/01/20   Eugenie Filler, MD  thiamine 100 MG tablet Take 1 tablet (100 mg total) by mouth daily. 01/01/20   Eugenie Filler, MD    Physical Exam: Vitals:    06/15/21 1724 06/15/21 1830 06/15/21 2003 06/15/21 2005  BP: (!) 156/78 (!) 173/82 (!) 162/81   Pulse: 87 89 (!) 103 (!) 103  Resp: _0 Temp: 97.9 F (36.6 C)     TempSrc: Oral     SpO2: 96% 98% 98% 98%    GENERAL: 65 y.o.-year-old chronically ill-appearing female patient, well-developed, well-nourished lying in the bed in no acute distress.  Arouses to stimuli but otherwise sleeps. HEENT: Head atraumatic, normocephalic. Pupils equal. Mucus membranes moist. NECK: Supple. No JVD. CHEST: Normal breath sounds bilaterally. No wheezing, rales, rhonchi or crackles. No use of accessory muscles of respiration.  No reproducible chest wall tenderness.  CARDIOVASCULAR: S1, S2 normal. No murmurs, rubs, or gallops. Cap refill <2 seconds. Pulses intact distally.  ABDOMEN: Soft, nondistended, nontender. No rebound, guarding, rigidity. Normoactive bowel sounds present in all four quadrants.  EXTREMITIES: No pedal edema, cyanosis, or clubbing. No calf tenderness or Homan's sign.  NEUROLOGIC: The patient is arousable confused.  Unable to follow commands SKIN: Warm, dry, and intact without obvious rash, lesion, or ulcer.    Labs on Admission:  CBC: Recent Labs  Lab 06/15/21 1832  WBC 11.4*  NEUTROABS 8.0*  HGB 15.4*  HCT 51.2*  MCV 101.2*  PLT 878   Basic Metabolic Panel: Recent Labs  Lab 06/15/21 1832  NA 156*  K 3.8  CL 118*  CO2 30  GLUCOSE 189*  BUN 24*  CREATININE 0.99  CALCIUM 10.4*   GFR: CrCl cannot be calculated (Unknown ideal weight.). Liver Function Tests: Recent Labs  Lab 06/15/21 1832  AST 25  ALT 22  ALKPHOS 90  BILITOT 0.7  PROT 8.6*  ALBUMIN 4.3   No results for input(s): LIPASE, AMYLASE in the last 168 hours. No results for input(s): AMMONIA in the last 168 hours. Coagulation Profile: No results for input(s): INR, PROTIME in the last 168 hours. Cardiac Enzymes: No results for input(s): CKTOTAL, CKMB, CKMBINDEX, TROPONINI in the last 168  hours. BNP (last 3 results) No results for input(s): PROBNP in the last 8760 hours. HbA1C: No results for input(s): HGBA1C in the last 72 hours. CBG: Recent Labs  Lab 06/15/21 1844  GLUCAP 170*   Lipid Profile: No results for input(s): CHOL, HDL, LDLCALC, TRIG, CHOLHDL, LDLDIRECT in the last 72 hours. Thyroid Function Tests: No results for input(s): TSH, T4TOTAL, FREET4, T3FREE, THYROIDAB in the last 72 hours. Anemia Panel: No results for input(s): VITAMINB12, FOLATE, FERRITIN, TIBC, IRON, RETICCTPCT in the last 72 hours. Urine analysis:    Component Value Date/Time   COLORURINE STRAW (A) 12/27/2019 1635   APPEARANCEUR CLEAR 12/27/2019 1635   LABSPEC 1.031 (H) 12/27/2019 1635   PHURINE 5.0 12/27/2019 1635   GLUCOSEU >=500 (A) 12/27/2019 1635   HGBUR SMALL (A) 12/27/2019 [place] 12/27/2019 [place] 12/27/2019 1635   PROTEINUR NEGATIVE 12/27/2019 1635   NITRITE NEGATIVE 12/27/2019 1635   LEUKOCYTESUR NEGATIVE  12/27/2019 1635   Sepsis Labs: _0 (procalcitonin:4,lacticidven:4) )No results found for this or any previous visit (from the past 240 hour(s)).   Radiological Exams on Admission: DG Chest 2 View  Result Date: 06/15/2021 CLINICAL DATA:  Altered level of consciousness, dementia, hyperglycemia EXAM: CHEST - 2 VIEW COMPARISON:  12/28/2019 FINDINGS: Frontal and lateral views of the chest demonstrate an unremarkable cardiac silhouette. No airspace disease, effusion, or pneumothorax. No acute bony abnormalities. IMPRESSION: 1. No acute intrathoracic process. Electronically Signed   By: Randa Ngo M.D.   On: 06/15/2021 18:24   CT HEAD WO CONTRAST  Result Date: 06/15/2021 CLINICAL DATA:  Altered mental status. EXAM: CT HEAD WITHOUT CONTRAST TECHNIQUE: Contiguous axial images were obtained from the base of the skull through the vertex without intravenous contrast. COMPARISON:  Head CT 10/02/2020 FINDINGS: Brain: Normal for age  atrophy. No intracranial hemorrhage, mass effect, or midline shift. No hydrocephalus. The basilar cisterns are patent. No evidence of territorial infarct or acute ischemia. No extra-axial or intracranial fluid collection. Vascular: No hyperdense vessel or unexpected calcification. Skull: No fracture or focal lesion. Sinuses/Orbits: Paranasal sinuses and mastoid air cells are clear. The visualized orbits are unremarkable. Bilateral cataract resection. Other: None IMPRESSION: Negative head CT. Electronically Signed   By: Keith Rake M.D.   On: 06/15/2021 18:44      Assessment/Plan  This is a 65 y.o. female with a history of diabetes, hypertension, dementia, resident of nursing facility now being admitted with:  #.  Hypovolemic hyponatremia - Admit inpatient - IV fluid rehydration - Neurochecks - Follow-up COVID, flu, urinalysis  # UTI - IV Rocephin - Follow up cultures  #. H/o Diabetes - Accuchecks achs with RISS coverage - Heart healthy, carb controlled diet Continue Lantus, hold metformin  #.  History of hypertension Continue Norvasc  #. History of hyperlipidemia - Continue Crestor  #. History of dementia - Continue thiamine   Admission status: Inpatient IV Fluids: Normal saline Diet/Nutrition: Heart healthy, carb controlled Consults called: None DVT Px: Lovenox, SCDs and early ambulation. Code Status: Full Code  Disposition Plan: To home in 1-2 days  All the records are reviewed and case discussed with ED provider. Management plans discussed with the patient and/or family who express understanding and agree with plan of care.  Kassadi Presswood D.O. on 06/15/2021 at 8:06 PM CC: Primary care physician; Gerlene Fee, DO   06/15/2021, 8:06 PM

## 2021-06-15 NOTE — ED Triage Notes (Signed)
EMS reports from [facility], staff called due to AMS from baseline today. Hx dementia. Staff also states CBGs have been running high for the past week. Staff stated CBG over 300 today. Given 12 units of Insulin at facility prior to EMS transport.  BP 160/90 HR 98 RR 16 Sp02 97 RA CBG 428  20ga RAC

## 2021-06-15 NOTE — ED Notes (Signed)
Pt pulled all VS monitoring off and pulled IV out. Pt placed in mittens

## 2021-06-16 ENCOUNTER — Inpatient Hospital Stay: Payer: Self-pay

## 2021-06-16 DIAGNOSIS — I10 Essential (primary) hypertension: Secondary | ICD-10-CM

## 2021-06-16 DIAGNOSIS — E870 Hyperosmolality and hypernatremia: Secondary | ICD-10-CM

## 2021-06-16 DIAGNOSIS — G309 Alzheimer's disease, unspecified: Secondary | ICD-10-CM

## 2021-06-16 DIAGNOSIS — R413 Other amnesia: Secondary | ICD-10-CM

## 2021-06-16 DIAGNOSIS — N390 Urinary tract infection, site not specified: Secondary | ICD-10-CM | POA: Diagnosis present

## 2021-06-16 DIAGNOSIS — E119 Type 2 diabetes mellitus without complications: Secondary | ICD-10-CM

## 2021-06-16 DIAGNOSIS — E860 Dehydration: Secondary | ICD-10-CM | POA: Diagnosis not present

## 2021-06-16 DIAGNOSIS — G9341 Metabolic encephalopathy: Secondary | ICD-10-CM | POA: Diagnosis not present

## 2021-06-16 DIAGNOSIS — F0281 Dementia in other diseases classified elsewhere with behavioral disturbance: Secondary | ICD-10-CM

## 2021-06-16 HISTORY — DX: Hyperosmolality and hypernatremia: E87.0

## 2021-06-16 HISTORY — DX: Urinary tract infection, site not specified: N39.0

## 2021-06-16 LAB — COMPREHENSIVE METABOLIC PANEL
ALT: 17 U/L (ref 0–44)
AST: 24 U/L (ref 15–41)
Albumin: 3.4 g/dL — ABNORMAL LOW (ref 3.5–5.0)
Alkaline Phosphatase: 66 U/L (ref 38–126)
Anion gap: 6 (ref 5–15)
BUN: 21 mg/dL (ref 8–23)
CO2: 29 mmol/L (ref 22–32)
Calcium: 9.5 mg/dL (ref 8.9–10.3)
Chloride: 120 mmol/L — ABNORMAL HIGH (ref 98–111)
Creatinine, Ser: 0.69 mg/dL (ref 0.44–1.00)
GFR, Estimated: >60 mL/min (ref >60)
Glucose, Bld: 130 mg/dL — ABNORMAL HIGH (ref 70–99)
Potassium: 4 mmol/L (ref 3.5–5.1)
Sodium: 155 mmol/L — ABNORMAL HIGH (ref 135–145)
Total Bilirubin: 0.8 mg/dL (ref 0.3–1.2)
Total Protein: 7 g/dL (ref 6.5–8.1)

## 2021-06-16 LAB — GLUCOSE, CAPILLARY
Glucose-Capillary: 290 mg/dL — ABNORMAL HIGH (ref 70–99)
Glucose-Capillary: 94 mg/dL (ref 70–99)

## 2021-06-16 LAB — CBC
HCT: 44.8 % (ref 36.0–46.0)
Hemoglobin: 13.8 g/dL (ref 12.0–15.0)
MCH: 30.8 pg (ref 26.0–34.0)
MCHC: 30.8 g/dL (ref 30.0–36.0)
MCV: 100 fL (ref 80.0–100.0)
Platelets: 125 10*3/uL — ABNORMAL LOW (ref 150–400)
RBC: 4.48 MIL/uL (ref 3.87–5.11)
RDW: 12.5 % (ref 11.5–15.5)
WBC: 11.6 10*3/uL — ABNORMAL HIGH (ref 4.0–10.5)
nRBC: 0 % (ref 0.0–0.2)

## 2021-06-16 LAB — HIV ANTIBODY (ROUTINE TESTING W REFLEX): HIV Screen 4th Generation wRfx: NONREACTIVE

## 2021-06-16 MED ORDER — DEXTROSE 5 % IV SOLN: INTRAVENOUS | Status: DC

## 2021-06-16 MED ORDER — QUETIAPINE FUMARATE 25 MG PO TABS
25.0000 mg | ORAL_TABLET | Freq: Two times a day (BID) | ORAL | Status: DC
  Administered 2021-06-16 (×2): 25 mg via ORAL
  Filled 2021-06-16 (×2): qty 1

## 2021-06-16 MED ORDER — HYDRALAZINE HCL 20 MG/ML IJ SOLN: 10.0000 mg | Freq: Four times a day (QID) | INTRAMUSCULAR | Status: DC | PRN

## 2021-06-16 MED ORDER — CHLORHEXIDINE GLUCONATE CLOTH 2 % EX PADS
6.0000 | MEDICATED_PAD | Freq: Every day | CUTANEOUS | Status: DC
  Administered 2021-06-17 – 2021-06-18 (×2): 6 via TOPICAL

## 2021-06-16 MED ORDER — INSULIN GLARGINE 100 UNIT/ML ~~LOC~~ SOLN
10.0000 [IU] | Freq: Every day | SUBCUTANEOUS | Status: DC
  Administered 2021-06-16: 10 [IU] via SUBCUTANEOUS
  Filled 2021-06-16 (×2): qty 0.1

## 2021-06-16 MED ORDER — SODIUM CHLORIDE 0.9% FLUSH: 10.0000 mL | INTRAVENOUS | Status: DC | PRN

## 2021-06-16 MED ORDER — SODIUM CHLORIDE 0.9% FLUSH
10.0000 mL | Freq: Two times a day (BID) | INTRAVENOUS | Status: DC
  Administered 2021-06-17 – 2021-06-18 (×2): 10 mL

## 2021-06-16 NOTE — Progress Notes (Signed)
Dr. Ramiro Harvest, MD was also made aware of patient's restless when attempting to obtain blood pressures.  New orders from Dr. Ramiro Harvest: Seroquel 25 mg tab 2x daily. Seroquel tab has been given and patient is now currently resting. Activity overlay was also ordered and given to the patient. Will continue to monitor patient.

## 2021-06-16 NOTE — Progress Notes (Signed)
Hypoglycemic Event  CBG: 66  Treatment: 4 oz juice/soda  Symptoms: Shaky and cold extremities  Follow-up CBG: Time:2357 CBG Result:94  Possible Reasons for Event: Inadequate meal intake and Unknown  Comments/MD notified:yes    Shamir Sedlar

## 2021-06-16 NOTE — Progress Notes (Signed)
Peripherally Inserted Central Catheter Placement  The IV Nurse has discussed with the patient and/or persons authorized to consent for the patient, the purpose of this procedure and the potential benefits and risks involved with this procedure.  The benefits include less needle sticks, lab draws from the catheter, and the patient may be discharged home with the catheter. Risks include, but not limited to, infection, bleeding, blood clot (thrombus formation), and puncture of an artery; nerve damage and irregular heartbeat and possibility to perform a PICC exchange if needed/ordered by physician.  Alternatives to this procedure were also discussed.  Bard Power PICC patient education guide, fact sheet on infection prevention and patient information card has been provided to patient /or left at bedside.    PICC Placement Documentation  PICC Single Lumen 06/16/21 Right Basilic 36 cm 0 cm (Active)  Indication for Insertion or Continuance of Line Poor Vasculature-patient has had multiple peripheral attempts or PIVs lasting less than 24 hours 06/16/21 1700  Exposed Catheter (cm) 0 cm 06/16/21 1700  Site Assessment Clean;Dry;Intact 06/16/21 1700  Line Status Flushed;Blood return noted 06/16/21 1700  Dressing Type Transparent 06/16/21 1700  Dressing Status Clean;Dry;Intact 06/16/21 1700  Antimicrobial disc in place? Yes 06/16/21 1700  Dressing Change Due 06/23/21 06/16/21 1700    Medical Necessity order written by Dr. Lonia Farber Horton 06/16/2021, 5:47 PM

## 2021-06-16 NOTE — Progress Notes (Signed)
Pt had mittens from ER as she was pulling tubes and IV. Mittens continued.

## 2021-06-16 NOTE — Plan of Care (Signed)
  Problem: Education: Goal: Knowledge of General Education information will improve Description Including pain rating scale, medication(s)/side effects and non-pharmacologic comfort measures Outcome: Progressing   

## 2021-06-16 NOTE — Progress Notes (Signed)
Earlier today, patient pulled peripheral IV out. Also, Dr. Ramiro Harvest, MD was made aware of charge nurse's and IV team's unsuccessful attempts of getting an IV in the patient. Order for PICC line was put in by Dr. Ramiro Harvest. Waiting on a new IV site.

## 2021-06-16 NOTE — Progress Notes (Signed)
PROGRESS NOTE    Mary Swanson  JKK:[id number] DOB: 1956-02-17 DOA: 06/15/2021 PCP: Lavonda Jumbo, DO    Chief Complaint  Patient presents with   Hyperglycemia   Altered Mental Status    Brief Narrative:  Patient is 65 year old female nursing home resident, history of diabetes hypertension dementia presented to the ED for altered mental status.  It is noted that at nursing care facility where patient resides staff reported patient was more sleepy than usual, more confused, less interactive noted to have blood glucose levels in the 400s all week.  Patient noted to have similar symptoms from prior UTIs.  Patient sent to the ED for further evaluation and management.  Urinalysis concerning for possible UTI.  Patient also noted to be dehydrated with severe hypernatremia.  Patient placed on IV antibiotics, cultures obtained and pending, IV fluids ordered however patient pulled out IV access and as such awaiting PICC line placement.   Assessment & Plan:   Principal Problem:   Acute metabolic encephalopathy Active Problems:   Hypernatremia   History of depression   Diabetes mellitus without complication (HCC)   Memory loss of unknown cause   Dehydration   Essential hypertension   Acute lower UTI   #1 acute metabolic encephalopathy -Likely multifactorial secondary to hypovolemic hypernatremia in the setting of probable UTI. -Urine cultures pending. -Patient alert but pleasantly confused. -Continue IV Rocephin. -Change IV fluids to D5W -Supportive care.  2.  Hypovolemic Hypernatremia -Likely secondary to volume depletion and dehydration. -Urinalysis concerning for UTI. -Urine cultures pending. -COVID-19 PCR negative. -Change IV fluids to D5W. -Follow.  3.  Dehydration -IV fluids.  4.  UTI -Urine cultures pending -IV Rocephin.  5.  Dementia -Patient noted to have some bouts of agitation. -Place on Seroquel twice daily. -Continue thiamine.  6.   Hypertension -Continue home regimen Norvasc 10 mg daily.  IV hydralazine as needed.  7.  Diabetes mellitus type 2 -Check hemoglobin A1c -CBG noted at 66 last night. -Decrease Lantus to 10 units daily. -SSI. -Monitor CBGs on D5W.  8.  Hyperlipidemia -Statin.     DVT prophylaxis: Lovenox Code Status: Full Family Communication: No family at bedside. Disposition:   Status is: Inpatient  Remains inpatient appropriate because:IV treatments appropriate due to intensity of illness or inability to take PO  Dispo: The patient is from: SNF              Anticipated d/c is to: SNF              Patient currently is not medically stable to d/c.   Difficult to place patient No       Consultants:  None  Procedures:  CT head 06/15/2021 Chest x-ray 06/15/2021  Antimicrobials:  IV Rocephin 06/16/2021>>>>   Subjective: Pleasantly confused.  No chest pain.  No shortness of breath.  No abdominal pain.  Noted to have pulled out IV.  Objective: Vitals:   06/16/21 0629 06/16/21 0652 06/16/21 0940 06/16/21 1340  BP: (!) 112/97 (!) 171/84 (!) 159/100 (!) 165/95  Pulse: 85  99 85  Resp: 19  19 18   Temp: 98.4 F (36.9 C)     TempSrc: Axillary     SpO2: 100%  100%     Intake/Output Summary (Last 24 hours) at 06/16/2021 1814 Last data filed at 06/16/2021 1523 Gross per 24 hour  Intake 996.3 ml  Output 930 ml  Net 66.3 ml   There were no vitals filed for this visit.  Examination:  General exam:  Appears calm and comfortable  Respiratory system: Clear to auscultation. Respiratory effort normal. Cardiovascular system: S1 & S2 heard, RRR. No JVD, murmurs, rubs, gallops or clicks. No pedal edema. Gastrointestinal system: Abdomen is nondistended, soft and nontender. No organomegaly or masses felt. Normal bowel sounds heard. Central nervous system: Alert.  Moving extremities spontaneously.  No focal neurological deficits. Extremities: Symmetric 5 x 5 power. Skin: No rashes, lesions or  ulcers Psychiatry: Judgement and insight appear poor. Mood & affect appropriate.     Data Reviewed: I have personally reviewed following labs and imaging studies  CBC: Recent Labs  Lab 06/15/21 1832 06/16/21 0312  WBC 11.4* 11.6*  NEUTROABS 8.0*  --   HGB 15.4* 13.8  HCT 51.2* 44.8  MCV 101.2* 100.0  PLT 239 125*    Basic Metabolic Panel: Recent Labs  Lab 06/15/21 1832 06/16/21 0312  NA 156* 155*  K 3.8 4.0  CL 118* 120*  CO2 30 29  GLUCOSE 189* 130*  BUN 24* 21  CREATININE 0.99 0.69  CALCIUM 10.4* 9.5    GFR: CrCl cannot be calculated (Unknown ideal weight.).  Liver Function Tests: Recent Labs  Lab 06/15/21 1832 06/16/21 0312  AST 25 24  ALT 22 17  ALKPHOS 90 66  BILITOT 0.7 0.8  PROT 8.6* 7.0  ALBUMIN 4.3 3.4*    CBG: Recent Labs  Lab 06/15/21 1844 06/15/21 2342 06/16/21 0000  GLUCAP 170* 66* 94     Recent Results (from the past 240 hour(s))  Resp Panel by RT-PCR (Flu A&B, Covid) Nasopharyngeal Swab     Status: None   Collection Time: 06/15/21  8:48 PM   Specimen: Nasopharyngeal Swab; Nasopharyngeal(NP) swabs in vial transport medium  Result Value Ref Range Status   SARS Coronavirus 2 by RT PCR NEGATIVE NEGATIVE Final    Comment: (NOTE) SARS-CoV-2 target nucleic acids are NOT DETECTED.  The SARS-CoV-2 RNA is generally detectable in upper respiratory specimens during the acute phase of infection. The lowest concentration of SARS-CoV-2 viral copies this assay can detect is 138 copies/mL. A negative result does not preclude SARS-Cov-2 infection and should not be used as the sole basis for treatment or other patient management decisions. A negative result may occur with  improper specimen collection/handling, submission of specimen other than nasopharyngeal swab, presence of viral mutation(s) within the areas targeted by this assay, and inadequate number of viral copies(<138 copies/mL). A negative result must be combined with clinical  observations, patient history, and epidemiological information. The expected result is Negative.  Fact Sheet for Patients:  BloggerCourse.com  Fact Sheet for Healthcare Providers:  SeriousBroker.it  This test is no t yet approved or cleared by the [place] FDA and  has been authorized for detection and/or diagnosis of SARS-CoV-2 by FDA under an Emergency Use Authorization (EUA). This EUA will remain  in effect (meaning this test can be used) for the duration of the COVID-19 declaration under Section 564(b)(1) of the Act, 21 U.S.C.section 360bbb-3(b)(1), unless the authorization is terminated  or revoked sooner.       Influenza A by PCR NEGATIVE NEGATIVE Final   Influenza B by PCR NEGATIVE NEGATIVE Final    Comment: (NOTE) The Xpert Xpress SARS-CoV-2/FLU/RSV plus assay is intended as an aid in the diagnosis of influenza from Nasopharyngeal swab specimens and should not be used as a sole basis for treatment. Nasal washings and aspirates are unacceptable for Xpert Xpress SARS-CoV-2/FLU/RSV testing.  Fact Sheet for Patients: BloggerCourse.com  Fact Sheet for Healthcare Providers: SeriousBroker.it  This test is not yet approved or cleared by the [place] and has been authorized for detection and/or diagnosis of SARS-CoV-2 by FDA under an Emergency Use Authorization (EUA). This EUA will remain in effect (meaning this test can be used) for the duration of the COVID-19 declaration under Section 564(b)(1) of the Act, 21 U.S.C. section 360bbb-3(b)(1), unless the authorization is terminated or revoked.  Performed at [facility], [address], [place], [place] [address]          Radiology Studies: DG Chest 2 View  Result Date: 06/15/2021 CLINICAL DATA:  Altered level of consciousness, dementia, hyperglycemia EXAM: CHEST - 2 VIEW COMPARISON:   12/28/2019 FINDINGS: Frontal and lateral views of the chest demonstrate an unremarkable cardiac silhouette. No airspace disease, effusion, or pneumothorax. No acute bony abnormalities. IMPRESSION: 1. No acute intrathoracic process. Electronically Signed   By: Sharlet Salina M.D.   On: 06/15/2021 18:24   CT HEAD WO CONTRAST  Result Date: 06/15/2021 CLINICAL DATA:  Altered mental status. EXAM: CT HEAD WITHOUT CONTRAST TECHNIQUE: Contiguous axial images were obtained from the base of the skull through the vertex without intravenous contrast. COMPARISON:  Head CT 10/02/2020 FINDINGS: Brain: Normal for age atrophy. No intracranial hemorrhage, mass effect, or midline shift. No hydrocephalus. The basilar cisterns are patent. No evidence of territorial infarct or acute ischemia. No extra-axial or intracranial fluid collection. Vascular: No hyperdense vessel or unexpected calcification. Skull: No fracture or focal lesion. Sinuses/Orbits: Paranasal sinuses and mastoid air cells are clear. The visualized orbits are unremarkable. Bilateral cataract resection. Other: None IMPRESSION: Negative head CT. Electronically Signed   By: Narda Rutherford M.D.   On: 06/15/2021 18:44   [place] EKG SITE RITE  Result Date: 06/16/2021 If Site Rite image not attached, placement could not be confirmed due to current cardiac rhythm.       Scheduled Meds:  amLODipine  10 mg Oral Daily   aspirin  81 mg Oral Daily   Chlorhexidine Gluconate Cloth  6 each Topical Daily   enoxaparin (LOVENOX) injection  40 mg Subcutaneous Q24H   insulin glargine  10 Units Subcutaneous QHS   QUEtiapine  25 mg Oral BID   rosuvastatin  20 mg Oral QHS   sodium chloride flush  10-40 mL Intracatheter Q12H   thiamine  100 mg Oral Daily   Continuous Infusions:  cefTRIAXone (ROCEPHIN)  IV 1 g (06/16/21 0048)   dextrose 125 mL/hr at 06/16/21 1812     LOS: 1 day    Time spent: 35 minutes    Ramiro Harvest, MD [facility]   To contact  the attending provider between 7A-7P or the covering provider during after hours 7P-7A, please log into the web site [email or web address] and access using universal [place] password for that web site. If you do not have the password, please call the hospital operator.  06/16/2021, 6:14 PM

## 2021-06-16 NOTE — Progress Notes (Signed)
VAST consulted to obtain IV access. Assessed bilateral lower and upper arms utilizing ultrasound. EXTREMELY limited vasculature available for USGIV. Attempted to obtain access twice in right anterior forearm, but unsuccessful. Advised pt's nurse of findings; she verbalized she will contact physician. Offered that night shift IV team could come and assess for possible access, but patient likely needs PICC or central line.

## 2021-06-16 NOTE — Progress Notes (Signed)
    OVERNIGHT PROGRESS REPORT  Notified by RN for CBG/hypoglycemic event  CBG: 66  for which she was given  4 oz juice/soda which resulted in a re-check value of   CBG :94 at 2357 hrs  Her symptoms at the time were: Shaky and cold extremities   She has had questionable/Inadequate meal intake. Lantus dose is held for now and a follow up CBG is pending.     Chinita Greenland MSNA MSN ACNPC-AG Acute Care Nurse Practitioner [facility]

## 2021-06-17 DIAGNOSIS — E870 Hyperosmolality and hypernatremia: Secondary | ICD-10-CM | POA: Diagnosis not present

## 2021-06-17 DIAGNOSIS — N390 Urinary tract infection, site not specified: Secondary | ICD-10-CM | POA: Diagnosis not present

## 2021-06-17 DIAGNOSIS — G9341 Metabolic encephalopathy: Secondary | ICD-10-CM | POA: Diagnosis not present

## 2021-06-17 DIAGNOSIS — G300 Alzheimer's disease with early onset: Secondary | ICD-10-CM

## 2021-06-17 DIAGNOSIS — B9620 Unspecified Escherichia coli [E. coli] as the cause of diseases classified elsewhere: Secondary | ICD-10-CM

## 2021-06-17 DIAGNOSIS — Z8659 Personal history of other mental and behavioral disorders: Secondary | ICD-10-CM

## 2021-06-17 LAB — BASIC METABOLIC PANEL
Anion gap: 4 — ABNORMAL LOW (ref 5–15)
BUN: 17 mg/dL (ref 8–23)
CO2: 28 mmol/L (ref 22–32)
Calcium: 8.6 mg/dL — ABNORMAL LOW (ref 8.9–10.3)
Chloride: 107 mmol/L (ref 98–111)
Creatinine, Ser: 0.73 mg/dL (ref 0.44–1.00)
GFR, Estimated: >60 mL/min (ref >60)
Glucose, Bld: 370 mg/dL — ABNORMAL HIGH (ref 70–99)
Potassium: 3.5 mmol/L (ref 3.5–5.1)
Sodium: 139 mmol/L (ref 135–145)

## 2021-06-17 LAB — GLUCOSE, CAPILLARY
Glucose-Capillary: 332 mg/dL — ABNORMAL HIGH (ref 70–99)
Glucose-Capillary: 216 mg/dL — ABNORMAL HIGH (ref 70–99)
Glucose-Capillary: 284 mg/dL — ABNORMAL HIGH (ref 70–99)

## 2021-06-17 LAB — CBC
HCT: 38.8 % (ref 36.0–46.0)
Hemoglobin: 12.3 g/dL (ref 12.0–15.0)
MCH: 31 pg (ref 26.0–34.0)
MCHC: 31.7 g/dL (ref 30.0–36.0)
MCV: 97.7 fL (ref 80.0–100.0)
Platelets: 158 10*3/uL (ref 150–400)
RBC: 3.97 MIL/uL (ref 3.87–5.11)
RDW: 11.8 % (ref 11.5–15.5)
WBC: 6 10*3/uL (ref 4.0–10.5)
nRBC: 0 % (ref 0.0–0.2)

## 2021-06-17 LAB — MAGNESIUM: Magnesium: 1.9 mg/dL (ref 1.7–2.4)

## 2021-06-17 LAB — HEMOGLOBIN A1C
Hgb A1c MFr Bld: 11.2 % — ABNORMAL HIGH (ref 4.8–5.6)
Mean Plasma Glucose: 275 mg/dL

## 2021-06-17 MED ORDER — ADULT MULTIVITAMIN W/MINERALS CH
1.0000 | ORAL_TABLET | Freq: Every day | ORAL | Status: DC
  Administered 2021-06-17 – 2021-06-19 (×3): 1 via ORAL
  Filled 2021-06-17 (×3): qty 1

## 2021-06-17 MED ORDER — INSULIN ASPART 100 UNIT/ML IJ SOLN
0.0000 [IU] | Freq: Three times a day (TID) | INTRAMUSCULAR | Status: DC
  Administered 2021-06-17: 5 [IU] via SUBCUTANEOUS
  Administered 2021-06-17: 11 [IU] via SUBCUTANEOUS
  Administered 2021-06-17: 8 [IU] via SUBCUTANEOUS
  Administered 2021-06-18 (×2): 3 [IU] via SUBCUTANEOUS
  Administered 2021-06-18: 8 [IU] via SUBCUTANEOUS
  Administered 2021-06-19: 11 [IU] via SUBCUTANEOUS
  Administered 2021-06-19: 5 [IU] via SUBCUTANEOUS

## 2021-06-17 MED ORDER — SODIUM CHLORIDE 0.45 % IV SOLN: INTRAVENOUS | Status: DC

## 2021-06-17 MED ORDER — LORAZEPAM 0.5 MG PO TABS
0.5000 mg | ORAL_TABLET | Freq: Two times a day (BID) | ORAL | Status: DC
  Administered 2021-06-17 – 2021-06-19 (×5): 0.5 mg via ORAL
  Filled 2021-06-17 (×5): qty 1

## 2021-06-17 MED ORDER — QUETIAPINE FUMARATE 25 MG PO TABS
25.0000 mg | ORAL_TABLET | Freq: Two times a day (BID) | ORAL | Status: DC | PRN
  Administered 2021-06-17 – 2021-06-19 (×5): 25 mg via ORAL
  Filled 2021-06-17 (×5): qty 1

## 2021-06-17 MED ORDER — POTASSIUM CHLORIDE CRYS ER 20 MEQ PO TBCR
40.0000 meq | EXTENDED_RELEASE_TABLET | Freq: Once | ORAL | Status: AC
  Administered 2021-06-17: 40 meq via ORAL
  Filled 2021-06-17: qty 2

## 2021-06-17 MED ORDER — HALOPERIDOL LACTATE 5 MG/ML IJ SOLN
2.0000 mg | Freq: Four times a day (QID) | INTRAMUSCULAR | Status: DC | PRN
  Administered 2021-06-17: 2 mg via INTRAVENOUS
  Filled 2021-06-17: qty 1

## 2021-06-17 MED ORDER — INSULIN GLARGINE 100 UNIT/ML ~~LOC~~ SOLN
14.0000 [IU] | Freq: Every day | SUBCUTANEOUS | Status: DC
  Administered 2021-06-17 – 2021-06-18 (×2): 14 [IU] via SUBCUTANEOUS
  Filled 2021-06-17 (×2): qty 0.14

## 2021-06-17 NOTE — Progress Notes (Signed)
Inpatient Diabetes Program Recommendations  AACE/ADA: New Consensus Statement on Inpatient Glycemic Control (2015)  Target Ranges:  Prepandial:   less than 140 mg/dL      Peak postprandial:   less than 180 mg/dL (1-2 hours)      Critically ill patients:  140 - 180 mg/dL   Lab Results  Component Value Date   GLUCAP 332 (H) 06/17/2021   HGBA1C 11.9 (H) 12/28/2019    Review of Glycemic Control  Diabetes history: DM2 Outpatient Diabetes medications: Lantus 26 units QHS, Humalog 2-12 units TID, metformin 1000 mg QAM Current orders for Inpatient glycemic control: Lantus 10 QHS, Novolog 0-15 units TID with meals and 0-5 HS  HgbA1C - 11.9% - doubtful pt is taking insulin as prescribed FBS this am - 370, 332 mg/dL  Inpatient Diabetes Program Recommendations:    Increase Lantus to 15 units QHS Add Novolog 2 units TID for meal coverage insulin if post-prandials > 180 mg/dL  Continue to follow.  Thank you. Ailene Ards, RD, LDN, CDE Inpatient Diabetes Coordinator [phone]

## 2021-06-17 NOTE — Progress Notes (Signed)
PROGRESS NOTE    Mary Swanson  URK:[id number] DOB: 19-Dec-1955 DOA: 06/15/2021 PCP: Lavonda Jumbo, DO    Chief Complaint  Patient presents with   Hyperglycemia   Altered Mental Status    Brief Narrative:  Patient is 65 year old female nursing home resident, history of diabetes hypertension dementia presented to the ED for altered mental status.  It is noted that at nursing care facility where patient resides staff reported patient was more sleepy than usual, more confused, less interactive noted to have blood glucose levels in the 400s all week.  Patient noted to have similar symptoms from prior UTIs.  Patient sent to the ED for further evaluation and management.  Urinalysis concerning for [facility] UTI.  Patient also noted to be dehydrated with severe hypernatremia.  Patient placed on IV antibiotics, cultures obtained and pending, IV fluids ordered however patient pulled out IV access and as such awaiting PICC line placement.   Assessment & Plan:   Principal Problem:   Acute metabolic encephalopathy Active Problems:   Hypernatremia   History of depression   Diabetes mellitus without complication (HCC)   Memory loss of unknown cause   Dehydration   Essential hypertension   Acute lower UTI   Alzheimer's dementia with behavioral disturbance (HCC)   1 acute metabolic encephalopathy -Likely multifactorial secondary to hypovolemic hypernatremia in the setting of UTI. -Urine cultures > 100,000 colonies of E. coli.   -Improving clinically.   -Patient alert, pleasantly confused.   -Change IV fluids from D5W to half-normal saline.   -Continue IV Rocephin pending sensitivities.   -Supportive care.   2.  Hypovolemic Hypernatremia -Likely secondary to volume depletion and dehydration. -Urinalysis concerning for UTI. -Urine cultures > 100,000 colonies of E. coli with sensitivities pending. -COVID-19 PCR negative. -Hypernatremia has improved.  Discontinue D5W and place on half-normal  saline..   -Follow.   3.  Dehydration -Change IV fluids to half-normal saline.   4.  E. coli UTI -Urine cultures > 100,000 colonies of E. coli with sensitivities pending.   -Continue IV Rocephin.    5.  Dementia -Patient noted to have some bouts of agitation. -Resume home regimen Ativan.   -Change Seroquel to 25 mg p.o. twice daily as needed.   -Continue thiamine.    6.  Hypertension -Norvasc 10 mg daily.   -IV hydralazine as needed.    7.  Diabetes mellitus type 2 -Hemoglobin A1c pending.   -CBG 332 this morning.   -Discontinue D5W.   -Patient noted to have a CBG of 66 the evening of 06/15/2021. -Increase Lantus to 14 units daily. -SSI. -Follow.  8.  Hyperlipidemia -Continue statin.     DVT prophylaxis: Lovenox Code Status: Full Family Communication: No family at bedside. Disposition:   Status is: Inpatient  Remains inpatient appropriate because:IV treatments appropriate due to intensity of illness or inability to take PO  Dispo: The patient is from: SNF              Anticipated d/c is to: SNF              Patient currently is not medically stable to d/c.   Difficult to place patient No       Consultants:  None  Procedures:  CT head 06/15/2021 Chest x-ray 06/15/2021  Antimicrobials:  IV Rocephin 06/16/2021>>>>   Subjective: Standing up in room.  Pleasantly confused.  No chest pain.  No shortness of breath.  No abdominal pain.  Holding onto IV line and purwick.  Objective:  Vitals:   06/16/21 0940 06/16/21 1340 06/16/21 2134 06/17/21 0522  BP: (!) 159/100 (!) 165/95 (!) 150/65 138/76  Pulse: 99 85 (!) 58 (!) 58  Resp: 19 18 16 16   Temp:   98.4 F (36.9 C) 98 F (36.7 C)  TempSrc:   Oral Oral  SpO2: 100%  99% 100%    Intake/Output Summary (Last 24 hours) at 06/17/2021 1131 Last data filed at 06/17/2021 1028 Gross per 24 hour  Intake 700 ml  Output 2300 ml  Net -1600 ml    There were no vitals filed for this  visit.  Examination:  General exam: : NAD Respiratory system: CTAB.  No wheezes, no rhonchi.  Speaking in full sentences.  Normal respiratory effort. Cardiovascular system: Regular rate and rhythm no murmurs rubs or gallops.  No JVD.  No lower extremity edema.  Gastrointestinal system: Abdomen soft, nontender, nondistended, positive bowel sounds.  No rebound.  No guarding. Central nervous system: Alert and oriented. No focal neurological deficits. Extremities: Symmetric 5 x 5 power. Skin: No rashes, lesions or ulcers Psychiatry: Judgement and insight appear poor. Mood & affect appropriate.   Data Reviewed: I have personally reviewed following labs and imaging studies  CBC: Recent Labs  Lab 06/15/21 1832 06/16/21 0312 06/17/21 0317  WBC 11.4* 11.6* 6.0  NEUTROABS 8.0*  --   --   HGB 15.4* 13.8 12.3  HCT 51.2* 44.8 38.8  MCV 101.2* 100.0 97.7  PLT 239 125* 158     Basic Metabolic Panel: Recent Labs  Lab 06/15/21 1832 06/16/21 0312 06/17/21 0317  NA 156* 155* 139  K 3.8 4.0 3.5  CL 118* 120* 107  CO2 30 29 28   GLUCOSE 189* 130* 370*  BUN 24* 21 17  CREATININE 0.99 0.69 0.73  CALCIUM 10.4* 9.5 8.6*  MG  --   --  1.9     GFR: CrCl cannot be calculated (Unknown ideal weight.).  Liver Function Tests: Recent Labs  Lab 06/15/21 1832 06/16/21 0312  AST 25 24  ALT 22 17  ALKPHOS 90 66  BILITOT 0.7 0.8  PROT 8.6* 7.0  ALBUMIN 4.3 3.4*     CBG: Recent Labs  Lab 06/15/21 2342 06/16/21 0000 06/16/21 2153 06/17/21 0731 06/17/21 1113  GLUCAP 66* 94 290* 332* 216*      Recent Results (from the past 240 hour(s))  Urine Culture     Status: Abnormal (Preliminary result)   Collection Time: 06/15/21  8:48 PM   Specimen: In/Out Cath Urine  Result Value Ref Range Status   Specimen Description   Final    IN/OUT CATH URINE Performed at [facility], [address] [place], [place]    Special Requests   Final     NONE Performed at [facility], [address] [place], [place]    Culture (A)  Final    >=100,000 COLONIES/mL GRAM NEGATIVE RODS SUSCEPTIBILITIES TO FOLLOW Performed at [facility], [address] [place], [address] [place]    Report Status PENDING  Incomplete  Resp Panel by RT-PCR (Flu A&B, Covid) Nasopharyngeal Swab     Status: None   Collection Time: 06/15/21  8:48 PM   Specimen: Nasopharyngeal Swab; Nasopharyngeal(NP) swabs in vial transport medium  Result Value Ref Range Status   SARS Coronavirus 2 by RT PCR NEGATIVE NEGATIVE Final    Comment: (NOTE) SARS-CoV-2 target nucleic acids are NOT DETECTED.  The SARS-CoV-2 RNA is generally detectable in upper  respiratory specimens during the acute phase of infection. The lowest concentration of SARS-CoV-2 viral copies this assay can detect is 138 copies/mL. A negative result does not preclude SARS-Cov-2 infection and should not be used as the sole basis for treatment or other patient management decisions. A negative result may occur with  improper specimen collection/handling, submission of specimen other than nasopharyngeal swab, presence of viral mutation(s) within the areas targeted by this assay, and inadequate number of viral copies(<138 copies/mL). A negative result must be combined with clinical observations, patient history, and epidemiological information. The expected result is Negative.  Fact Sheet for Patients:  BloggerCourse.comhttps://www.fda.gov/media/152166/download  Fact Sheet for Healthcare Providers:  SeriousBroker.ithttps://www.fda.gov/media/152162/download  This test is no t yet approved or cleared by the [place] FDA and  has been authorized for detection and/or diagnosis of SARS-CoV-2 by FDA under an Emergency Use Authorization (EUA). This EUA will remain  in effect (meaning this test can be used) for the duration of the COVID-19 declaration under Section 564(b)(1) of the Act, 21 U.S.C.section  360bbb-3(b)(1), unless the authorization is terminated  or revoked sooner.       Influenza A by PCR NEGATIVE NEGATIVE Final   Influenza B by PCR NEGATIVE NEGATIVE Final    Comment: (NOTE) The Xpert Xpress SARS-CoV-2/FLU/RSV plus assay is intended as an aid in the diagnosis of influenza from Nasopharyngeal swab specimens and should not be used as a sole basis for treatment. Nasal washings and aspirates are unacceptable for Xpert Xpress SARS-CoV-2/FLU/RSV testing.  Fact Sheet for Patients: BloggerCourse.comhttps://www.fda.gov/media/152166/download  Fact Sheet for Healthcare Providers: SeriousBroker.ithttps://www.fda.gov/media/152162/download  This test is not yet approved or cleared by the [place] FDA and has been authorized for detection and/or diagnosis of SARS-CoV-2 by FDA under an Emergency Use Authorization (EUA). This EUA will remain in effect (meaning this test can be used) for the duration of the COVID-19 declaration under Section 564(b)(1) of the Act, 21 U.S.C. section 360bbb-3(b)(1), unless the authorization is terminated or revoked.  Performed at [facility], [address] [place], [place] [address]           Radiology Studies: DG Chest 2 View  Result Date: 06/15/2021 CLINICAL DATA:  Altered level of consciousness, dementia, hyperglycemia EXAM: CHEST - 2 VIEW COMPARISON:  12/28/2019 FINDINGS: Frontal and lateral views of the chest demonstrate an unremarkable cardiac silhouette. No airspace disease, effusion, or pneumothorax. No acute bony abnormalities. IMPRESSION: 1. No acute intrathoracic process. Electronically Signed   By: Sharlet SalinaMichael  Brown M.D.   On: 06/15/2021 18:24   CT HEAD WO CONTRAST  Result Date: 06/15/2021 CLINICAL DATA:  Altered mental status. EXAM: CT HEAD WITHOUT CONTRAST TECHNIQUE: Contiguous axial images were obtained from the base of the skull through the vertex without intravenous contrast. COMPARISON:  Head CT 10/02/2020 FINDINGS: Brain: Normal for  age atrophy. No intracranial hemorrhage, mass effect, or midline shift. No hydrocephalus. The basilar cisterns are patent. No evidence of territorial infarct or acute ischemia. No extra-axial or intracranial fluid collection. Vascular: No hyperdense vessel or unexpected calcification. Skull: No fracture or focal lesion. Sinuses/Orbits: Paranasal sinuses and mastoid air cells are clear. The visualized orbits are unremarkable. Bilateral cataract resection. Other: None IMPRESSION: Negative head CT. Electronically Signed   By: Narda RutherfordMelanie  Sanford M.D.   On: 06/15/2021 18:44   US EKG SITE RITE  Result Date: 06/16/2021 If Site Rite image not attached, placement could not be confirmed due to current cardiac rhythm.       Scheduled Meds:  amLODipine  10  mg Oral Daily   aspirin  81 mg Oral Daily   Chlorhexidine Gluconate Cloth  6 each Topical Daily   enoxaparin (LOVENOX) injection  40 mg Subcutaneous Q24H   insulin aspart  0-15 Units Subcutaneous TID WC   insulin glargine  10 Units Subcutaneous QHS   LORazepam  0.5 mg Oral BID   multivitamin with minerals  1 tablet Oral Daily   rosuvastatin  20 mg Oral QHS   sodium chloride flush  10-40 mL Intracatheter Q12H   thiamine  100 mg Oral Daily   Continuous Infusions:  sodium chloride 125 mL/hr at 06/17/21 0754   cefTRIAXone (ROCEPHIN)  IV Stopped (06/17/21 5681)     LOS: 2 days    Time spent: 35 minutes    Ramiro Harvest, MD [facility]   To contact the attending provider between 7A-7P or the covering provider during after hours 7P-7A, please log into the web site [email or web address] and access using universal [place] password for that web site. If you do not have the password, please call the hospital operator.  06/17/2021, 11:31 AM

## 2021-06-17 NOTE — Evaluation (Signed)
Physical Therapy Evaluation Patient Details Name: Jeffery Gammell MRN: [id number] DOB: 09/25/1956 Today's Date: 06/17/2021   History of Present Illness  65 year old female nursing home resident,  presented to the ED for altered mental status, admitted with hypovolemic hypernatremia in the setting of probable UTI.Marland Kitchen PMH: diabetes, hypertension, dementia .  Clinical Impression  Patient evaluated by Physical Therapy with no further acute PT needs identified. All education has been completed and the patient has no further questions.  PT amb hallway distance requiring assist/supervision only d/t PICC line - pt pulling on line at times.  Pt family reports that she walks almost constantly at the facility where she resides.  See below for any follow-up Physical Therapy or equipment needs. PT is signing off. Thank you for this referral.     Follow Up Recommendations No PT follow up    Equipment Recommendations  None recommended by PT    Recommendations for Other Services       Precautions / Restrictions Precautions Precautions: Fall Precaution Comments: secondary to IV pole, purwick Restrictions Weight Bearing Restrictions: No      Mobility  Bed Mobility               General bed mobility comments: NT--pt in recliner    Transfers Overall transfer level: Needs assistance   Transfers: Sit to/from Stand Sit to Stand: Supervision         General transfer comment: for safety only, uses momentum to stand  Ambulation/Gait Ambulation/Gait assistance: Supervision Gait Distance (Feet): 280 Feet Assistive device: None Gait Pattern/deviations: Step-through pattern;Drifts right/left     General Gait Details: no LOB, supervision for safety (mostly d/t PICC line--pt attempting to pull line at times)  Stairs            Wheelchair Mobility    Modified Rankin (Stroke Patients Only)       Balance                             High level balance activites:  Backward walking;Direction changes;Turns;Head turns;Side stepping High Level Balance Comments: no LOB with above             Pertinent Vitals/Pain Pain Assessment: No/denies pain Faces Pain Scale: No hurt    Home Living Family/patient expects to be discharged to:: Skilled nursing facility                 Additional Comments: return to SNF    Prior Function Level of Independence: Needs assistance   Gait / Transfers Assistance Needed: ambulates without a device  ADL's / Homemaking Assistance Needed: needs assistance with all ADLs.        Hand Dominance   Dominant Hand: Right    Extremity/Trunk Assessment   Upper Extremity Assessment Upper Extremity Assessment: Overall WFL for tasks assessed    Lower Extremity Assessment Lower Extremity Assessment: Defer to PT evaluation    Cervical / Trunk Assessment Cervical / Trunk Assessment: Normal  Communication   Communication: No difficulties  Cognition Arousal/Alertness: Awake/alert Behavior During Therapy: WFL for tasks assessed/performed Overall Cognitive Status: History of cognitive impairments - at baseline                                        General Comments      Exercises     Assessment/Plan    PT Assessment Patent  does not need any further PT services  PT Problem List         PT Treatment Interventions      PT Goals (Current goals can be found in the Care Plan section)  Acute Rehab PT Goals PT Goal Formulation: All assessment and education complete, DC therapy    Frequency     Barriers to discharge        Co-evaluation               AM-PAC PT "6 Clicks" Mobility  Outcome Measure Help needed turning from your back to your side while in a flat bed without using bedrails?: None Help needed moving from lying on your back to sitting on the side of a flat bed without using bedrails?: None Help needed moving to and from a bed to a chair (including a wheelchair)?:  None Help needed standing up from a chair using your arms (e.g., wheelchair or bedside chair)?: None Help needed to walk in hospital room?: None Help needed climbing 3-5 steps with a railing? : None 6 Click Score: 24    End of Session   Activity Tolerance: Patient tolerated treatment well Patient left: Other (comment);with family/visitor present (family present, aware to alert nursing when they leave and have pt in bed or recliner, nursing staff also made aware)   PT Visit Diagnosis: Other abnormalities of gait and mobility (R26.89)    Time: 3710-6269 PT Time Calculation (min) (ACUTE ONLY): 13 min   Charges:   PT Evaluation $PT Eval Low Complexity: 1 Low          Gianina Olinde, PT  Acute Rehab Dept ([facility]) [phone] Pager [phone]  06/17/2021   [facility] 06/17/2021, 4:10 PM

## 2021-06-17 NOTE — Evaluation (Signed)
Occupational Therapy Evaluation Patient Details Name: Mary Swanson MRN: [id number] DOB: 11-Jun-1956 Today's Date: 06/17/2021    History of Present Illness 65 year old female nursing home resident,  presented to the ED for altered mental status, admitted with hypovolemic hypernatremia in the setting of probable UTI.Marland Kitchen PMH: diabetes, hypertension, dementia .   Clinical Impression   Patient presents with her baseline cognitive deficits from dementia. Patient appears to be at her baseline - needing assistance for ADLs but able to ambulate without a device. Patient needing supervision to ambulate due to IV. No OT needs at this time. Return to SNF at discharge.     Follow Up Recommendations  No OT follow up    Equipment Recommendations  None recommended by OT    Recommendations for Other Services       Precautions / Restrictions Precautions Precautions: Fall Precaution Comments: secondary to IV pole, purwick Restrictions Weight Bearing Restrictions: No      Mobility Bed Mobility               General bed mobility comments: NT--pt in recliner    Transfers Overall transfer level: Needs assistance   Transfers: Sit to/from Stand Sit to Stand: Supervision         General transfer comment: for safety only, uses momentum to stand    Balance Overall balance assessment: No apparent balance deficits (not formally assessed)                           High level balance activites: Backward walking;Direction changes;Turns;Head turns;Side stepping High Level Balance Comments: no LOB with above           ADL either performed or assessed with clinical judgement   ADL Overall ADL's : At baseline                                       General ADL Comments: Needs set up for feeding and assistance for all other ADLs secondary to advanced dementia. Incontinent at baseline.     Vision   Vision Assessment?: No apparent visual deficits      Perception     Praxis      Pertinent Vitals/Pain Pain Assessment: No/denies pain Faces Pain Scale: No hurt     Hand Dominance Right   Extremity/Trunk Assessment Upper Extremity Assessment Upper Extremity Assessment: Overall WFL for tasks assessed   Lower Extremity Assessment Lower Extremity Assessment: Defer to PT evaluation   Cervical / Trunk Assessment Cervical / Trunk Assessment: Normal   Communication Communication Communication: No difficulties   Cognition Arousal/Alertness: Awake/alert Behavior During Therapy: WFL for tasks assessed/performed Overall Cognitive Status: History of cognitive impairments - at baseline                                     General Comments       Exercises     Shoulder Instructions      Home Living Family/patient expects to be discharged to:: Skilled nursing facility                                 Additional Comments: return to SNF      Prior Functioning/Environment Level of Independence: Needs assistance  Gait / Transfers Assistance  Needed: ambulates without a device ADL's / Homemaking Assistance Needed: needs assistance with all ADLs.            OT Problem List:        OT Treatment/Interventions:      OT Goals(Current goals can be found in the care plan section) Acute Rehab OT Goals OT Goal Formulation: All assessment and education complete, DC therapy  OT Frequency:     Barriers to D/C:            Co-evaluation              AM-PAC OT "6 Clicks" Daily Activity     Outcome Measure Help from another person eating meals?: A Little Help from another person taking care of personal grooming?: A Little Help from another person toileting, which includes using toliet, bedpan, or urinal?: Total Help from another person bathing (including washing, rinsing, drying)?: A Lot Help from another person to put on and taking off regular upper body clothing?: A Lot Help from another person  to put on and taking off regular lower body clothing?: Total 6 Click Score: 12   End of Session Nurse Communication: Mobility status  Activity Tolerance: Patient tolerated treatment well Patient left: Other (comment) (seated by window with daughter)                   Time: 2992-4268 OT Time Calculation (min): 15 min Charges:  OT General Charges $OT Visit: 1 Visit OT Evaluation $OT Eval Low Complexity: 1 Low  Kanan Sobek, OTR/L Acute Care Rehab Services  Office [phone] Pager: [phone]   Kelli Churn 06/17/2021, 4:12 PM

## 2021-06-17 NOTE — TOC Initial Note (Signed)
Transition of Care [facility]) - Initial/Assessment Note    Patient Details  Name: Mary Swanson MRN: [id number] Date of Birth: [birth year]  Transition of Care [facility]) CM/SW Contact:    Lennart Pall, LCSW Phone Number: 06/17/2021, 12:56 PM  Clinical Narrative:                 Met with pt this morning to introduce self/ TOC role.  Pt lying in bed, smiling, very pleasant and agreeable to our help in returning to her ALF.  She is really only oriented to self.  She is agreeable to [facility] contacting [place] as well as her POA, Phyllis Cobb. Spoke with Glorianne Manchester, RN administrator at [facility] who reports that they are fully prepared for pt to return when medically ready and could admit back over the weekend.  She reports that pt's baseline fxn is "pleasantly confused" and ambulatory without device.  Will ask weekend TOC coverage to alert [facility] if she is deemed ready for return. Spoke with pt's POA and former neighbor, [name], who is aware and agreeable with plan for pt's return to ALF when cleared.     Expected Discharge Plan: Assisted Living (return to [facility]) Barriers to Discharge: Continued Medical Work up   Patient Goals and CMS Choice Patient states their goals for this hospitalization and ongoing recovery are:: "go back" to ALF      Expected Discharge Plan and Services Expected Discharge Plan: Assisted Living (return to [facility]) In-house Referral: Clinical Social Work     Living arrangements for the past 2 months: [place]                                      Prior Living Arrangements/Services Living arrangements for the past 2 months: [place] Lives with:: Facility Resident Patient language and need for interpreter reviewed:: Yes Do you feel safe going back to the place where you live?: Yes      Need for Family Participation in Patient Care: No (Comment) Care giver support system in place?: Yes  (comment)   Criminal Activity/Legal Involvement Pertinent to Current Situation/Hospitalization: No - Comment as needed  Activities of Daily Living Home Assistive Devices/Equipment: Other (Comment) (pt unable to state what equipment she uses) ADL Screening (condition at time of admission) Patient's cognitive ability adequate to safely complete daily activities?: No Is the patient deaf or have difficulty hearing?: No Does the patient have difficulty seeing, even when wearing glasses/contacts?: No (hx bilateral cataract extraction) Does the patient have difficulty concentrating, remembering, or making decisions?: Yes Patient able to express need for assistance with ADLs?: No Does the patient have difficulty dressing or bathing?: Yes Independently performs ADLs?: No Communication: Independent Is this a change from baseline?: Pre-admission baseline Dressing (OT): Needs assistance Is this a change from baseline?: Pre-admission baseline Grooming: Needs assistance Is this a change from baseline?: Pre-admission baseline Feeding: Needs assistance Is this a change from baseline?: Pre-admission baseline Bathing: Needs assistance Is this a change from baseline?: Pre-admission baseline Toileting: Needs assistance Is this a change from baseline?: Pre-admission baseline In/Out Bed: Needs assistance Is this a change from baseline?: Pre-admission baseline Walks in Home: Dependent Is this a change from baseline?: Pre-admission baseline Does the patient have difficulty walking or climbing stairs?: Yes Weakness of Legs: Both Weakness of Arms/Hands: Both  Permission Sought/Granted Permission sought to share information with : Facility  Contact Representative, Other (comment) Permission granted to share information with : Yes, Verbal Permission Granted  Share Information with NAME: Glorianne Manchester, administrator at [facility] [phone]     Permission granted to share info w Relationship:  [name], former neighbor and [place] @ [phone]     Emotional Assessment Appearance:: Appears stated age Attitude/Demeanor/Rapport: Engaged, Gracious Affect (typically observed): Accepting, Pleasant, Quiet Orientation: : Oriented to Self, Oriented to Place Alcohol / Substance Use: Not Applicable Psych Involvement: No (comment)  Admission diagnosis:  Hypernatremia [E87.0] Altered mental status [R41.82] Altered mental status, unspecified altered mental status type [R41.82] Patient Active Problem List   Diagnosis Date Noted   Hypernatremia 06/16/2021   Acute lower UTI 06/16/2021   Alzheimer's dementia with behavioral disturbance ([place])    Altered mental status 06/15/2021   Hospital discharge follow-up 01/02/2020   Abnormal CXR    Essential hypertension    Acute metabolic encephalopathy 41/71/2787   Diabetes mellitus ([place]) 12/28/2019   Hyperosmolar hyperglycemic state (HHS) ([place]) 12/27/2019   AKI (acute kidney injury) ([place]) 12/27/2019   Dehydration 12/27/2019   Elevated blood pressure reading 12/03/2019   Memory loss of unknown cause 12/03/2019   Hyperglycemia 12/03/2019   ACE-inhibitor cough 03/14/2019   Diabetes mellitus without complication ([place])    Heart murmur, systolic 18/36/7255   DIABETES MELLITUS II, UNCOMPLICATED 00/16/4290   Hyperlipidemia 01/24/2007   OBESITY, NOS 01/24/2007   Anemia 01/24/2007   History of depression 01/24/2007   RHINITIS, ALLERGIC 01/24/2007   Type 2 diabetes mellitus with hyperglycemia ([place]) 01/24/2007   PCP:  Gerlene Fee, DO Pharmacy:   [facility] [place] [place] - [address] [place] [address] Phone: [phone] Fax: [phone]     Social Determinants of Health (SDOH) Interventions    Readmission Risk Interventions No flowsheet data found.

## 2021-06-18 DIAGNOSIS — E870 Hyperosmolality and hypernatremia: Secondary | ICD-10-CM | POA: Diagnosis not present

## 2021-06-18 DIAGNOSIS — Z794 Long term (current) use of insulin: Secondary | ICD-10-CM

## 2021-06-18 DIAGNOSIS — G300 Alzheimer's disease with early onset: Secondary | ICD-10-CM | POA: Diagnosis not present

## 2021-06-18 DIAGNOSIS — E1165 Type 2 diabetes mellitus with hyperglycemia: Secondary | ICD-10-CM

## 2021-06-18 DIAGNOSIS — N390 Urinary tract infection, site not specified: Secondary | ICD-10-CM | POA: Diagnosis not present

## 2021-06-18 DIAGNOSIS — G9341 Metabolic encephalopathy: Secondary | ICD-10-CM | POA: Diagnosis not present

## 2021-06-18 LAB — BASIC METABOLIC PANEL
Anion gap: 5 (ref 5–15)
BUN: 21 mg/dL (ref 8–23)
CO2: 30 mmol/L (ref 22–32)
Calcium: 8.8 mg/dL — ABNORMAL LOW (ref 8.9–10.3)
Chloride: 107 mmol/L (ref 98–111)
Creatinine, Ser: 0.67 mg/dL (ref 0.44–1.00)
GFR, Estimated: >60 mL/min (ref >60)
Glucose, Bld: 184 mg/dL — ABNORMAL HIGH (ref 70–99)
Potassium: 3.8 mmol/L (ref 3.5–5.1)
Sodium: 142 mmol/L (ref 135–145)

## 2021-06-18 LAB — GLUCOSE, CAPILLARY
Glucose-Capillary: 150 mg/dL — ABNORMAL HIGH (ref 70–99)
Glucose-Capillary: 177 mg/dL — ABNORMAL HIGH (ref 70–99)
Glucose-Capillary: 194 mg/dL — ABNORMAL HIGH (ref 70–99)
Glucose-Capillary: 223 mg/dL — ABNORMAL HIGH (ref 70–99)
Glucose-Capillary: 262 mg/dL — ABNORMAL HIGH (ref 70–99)

## 2021-06-18 LAB — URINE CULTURE: Culture: >=100000 — AB

## 2021-06-18 MED ORDER — LANTUS SOLOSTAR 100 UNIT/ML ~~LOC~~ SOPN: 18.0000 [IU] | PEN_INJECTOR | Freq: Every day | SUBCUTANEOUS | 0 refills | Status: DC

## 2021-06-18 MED ORDER — QUETIAPINE FUMARATE 25 MG PO TABS: 25.0000 mg | ORAL_TABLET | Freq: Two times a day (BID) | ORAL | 0 refills | Status: DC | PRN

## 2021-06-18 MED ORDER — ACETAMINOPHEN 325 MG PO TABS: 650.0000 mg | ORAL_TABLET | Freq: Four times a day (QID) | ORAL | Status: DC | PRN

## 2021-06-18 MED ORDER — LORAZEPAM 0.5 MG PO TABS
0.5000 mg | ORAL_TABLET | Freq: Two times a day (BID) | ORAL | 0 refills | Status: DC
Start: 2021-06-18 — End: 2022-01-12

## 2021-06-18 NOTE — Plan of Care (Signed)
?  Problem: Education: ?Goal: Knowledge of General Education information will improve ?Description: Including pain rating scale, medication(s)/side effects and non-pharmacologic comfort measures ?Outcome: Progressing ?  ?Problem: Safety: ?Goal: Ability to remain free from injury will improve ?Outcome: Progressing ?  ?Problem: Pain Managment: ?Goal: General experience of comfort will improve ?Outcome: Progressing ?  ?

## 2021-06-18 NOTE — Discharge Summary (Signed)
Physician Discharge Summary  Melvenia Favela FWY:[id number] DOB: Dec 15, 1955 DOA: 06/15/2021  PCP: Gerlene Fee, DO  Admit date: 06/15/2021 Discharge date: 06/18/2021  Time spent: 55 minutes  Recommendations for Outpatient Follow-up:  Follow-up with Autry-Lott, Naaman Plummer, DO in 2 weeks.  On follow-up patient will need a basic metabolic profile done to follow-up on electrolytes and renal function.  Patient's diabetes will also need to be reassessed.  Final sensitivities on urine cultures will need to be followed up upon.  Patient status post 3 days IV Rocephin.    Discharge Diagnoses:  Principal Problem:   Acute metabolic encephalopathy Active Problems:   Hypernatremia   History of depression   Diabetes mellitus without complication (HCC)   Memory loss of unknown cause   Dehydration   Essential hypertension   Acute lower UTI   Alzheimer's dementia with behavioral disturbance ([place])   Discharge Condition: Stable and improved.  Diet recommendation: Carb modified diet  Filed Weights   06/17/21 2050  Weight: 81.3 kg    History of present illness:  HPI per Dr. Zachery Dauer [place] is a 65 y.o. female with a known history of DM, HTN, dementia, resident of nursing facility presents to the emergency department for evaluation of AMS.  Patient resides in a nursing care facility where the staff reported that she had been sleeping more than usual, confused, less interactive.  Her blood sugars have been greater than 400 all week.  Her insulin has been increased in dosage in the past week.  Nursing indicates similar symptoms with previous urinary tract infection.     EMS/ED Course: Patient received NS. Medical admission has been requested for further management of hypernatremia 2/2 dehydration.Marland Kitchen  Hospital Course:  1 acute metabolic encephalopathy -Likely multifactorial secondary to hypovolemic hypernatremia in the setting of UTI. -Urine cultures > 100,000 colonies of E. coli.    -Patient received 3 days of IV Rocephin during the hospitalization. -Patient placed on IV fluids with D5W with resolution of hyponatremia. -Patient improved clinically was alert, pleasantly confused and back to baseline by day of discharge.  2.  Hypovolemic Hypernatremia -Likely secondary to volume depletion and dehydration. -Urinalysis concerning for UTI. -Urine cultures > 100,000 colonies of E. coli with sensitivities pending. -COVID-19 PCR negative. -Hypernatremia improved and resolved with D5W.   -Outpatient follow-up.  3.  Dehydration -Patient hydrated with IV fluids and was euvolemic by day of discharge.   4.  E. coli UTI -Urine cultures > 100,000 colonies of E. coli with sensitivities pending.   -Patient received 3 days of IV Rocephin during the hospitalization and requires no further antibiotics on discharge.  5.  Dementia -Patient noted to have some bouts of agitation. -Patient placed back on home regimen of Ativan.   -Patient also placed on Seroquel 25 mg twice daily as needed for agitation.   -Patient maintained on home regimen thiamine.   -Patient improved clinically and was at baseline by day of discharge.   -Outpatient follow-up with PCP.  6.  Hypertension -Patient maintained on home regimen Norvasc 10 mg daily.   -Outpatient follow-up.   7.  Diabetes mellitus type 2 -Hemoglobin A1c 11.2 -Patient placed on Lantus during the hospitalization as well as sliding scale insulin.   -Initially on presentation Lantus dose had to be decreased as patient noted to have a CBG as low as 66 the evening of 06/15/2021.   -As patient improved clinically oral intake improved in addition to D5W CBG started to increase.   -D5W discontinued and Lantus uptitrated  to 14 units daily.   -Patient with discharge on Lantus 18 units daily as well as sliding scale that patient was on prior to admission.   -Outpatient follow-up with PCP.  8.  Hyperlipidemia -Patient maintained on home regimen  statin     Procedures: CT head 06/15/2021 Chest x-ray 06/15/2021    Consultations: None  Discharge Exam: Vitals:   06/17/21 2050 06/18/21 0648  BP: 128/70 (!) 160/74  Pulse: 65 (!) 55  Resp: 16 16  Temp: 98 F (36.7 C) 97.9 F (36.6 C)  SpO2: 98% 99%    General: NAD. Cardiovascular: RRR Respiratory: CTAB.  Discharge Instructions   Discharge Instructions     Diet Carb Modified   Complete by: As directed    Increase activity slowly   Complete by: As directed       Allergies as of 06/18/2021       Reactions   Erythromycin Diarrhea   Latex Hives   Lisinopril Cough        Medication List     STOP taking these medications    Ensure Max Protein Liqd   feeding supplement (PRO-STAT SUGAR FREE 64) Liqd   insulin aspart 100 UNIT/ML injection Commonly known as: novoLOG   mupirocin ointment 2 % Commonly known as: BACTROBAN       TAKE these medications    acetaminophen 325 MG tablet Commonly known as: TYLENOL Take 2 tablets (650 mg total) by mouth every 6 (six) hours as needed for mild pain (or Fever >/= 101).   AgaMatrix Presto Test test strip Generic drug: glucose blood Check blood glucose 3 times daily before meals and as needed   AgaMatrix Presto w/Device Kit Check blood glucose three times daily before meals and as needed   AgaMatrix Ultra-Thin Lancets Misc Check blood glucose 3 times daily before meals and as needed   amLODipine 10 MG tablet Commonly known as: NORVASC Take 1 tablet (10 mg total) by mouth daily.   aspirin 81 MG chewable tablet Chew 81 mg by mouth daily.   glucose 4 GM chewable tablet Chew 1 tablet by mouth as needed for low blood sugar.   insulin lispro 100 UNIT/ML KwikPen Commonly known as: HUMALOG Inject 2-12 Units into the skin See admin instructions. Inject 2-12 units into the skin three times a day, per sliding scale: BGL 151-200 = 2 units; 201-250 = 3 units; 251-300 = 4 units; 301-350 = 6 units; 351-400 = 10  units; >401 = 12 units and CALL NP   Insulin Pen Needle 32G X 4 MM Misc Commonly known as: BD Pen Needle Nano U/F 1 Stick by Does not apply route every morning.   Lantus SoloStar 100 UNIT/ML Solostar Pen Generic drug: insulin glargine Inject 18 Units into the skin daily. What changed:  how much to take when to take this   LORazepam 0.5 MG tablet Commonly known as: ATIVAN Take 1 tablet (0.5 mg total) by mouth in the morning and at bedtime.   metFORMIN 500 MG 24 hr tablet Commonly known as: GLUCOPHAGE-XR Take 1,000 mg by mouth daily with breakfast.   multivitamin with minerals Tabs tablet Take 1 tablet by mouth daily.   QUEtiapine 25 MG tablet Commonly known as: SEROQUEL Take 1 tablet (25 mg total) by mouth 2 (two) times daily as needed (agitation).   rosuvastatin 20 MG tablet Commonly known as: CRESTOR Take 20 mg by mouth at bedtime.   thiamine 100 MG tablet Take 1 tablet (100 mg total) by mouth  daily.       Allergies  Allergen Reactions   Erythromycin Diarrhea   Latex Hives   Lisinopril Cough    Follow-up Information     Autry-Lott, Simone, DO. Schedule an appointment as soon as possible for a visit in 2 week(s).   Specialty: Family Medicine Contact information: [address] [place] [address] [phone]                  The results of significant diagnostics from this hospitalization (including imaging, microbiology, ancillary and laboratory) are listed below for reference.    Significant Diagnostic Studies: DG Chest 2 View  Result Date: 06/15/2021 CLINICAL DATA:  Altered level of consciousness, dementia, hyperglycemia EXAM: CHEST - 2 VIEW COMPARISON:  12/28/2019 FINDINGS: Frontal and lateral views of the chest demonstrate an unremarkable cardiac silhouette. No airspace disease, effusion, or pneumothorax. No acute bony abnormalities. IMPRESSION: 1. No acute intrathoracic process. Electronically Signed   By: Randa Ngo M.D.   On:  06/15/2021 18:24   CT HEAD WO CONTRAST  Result Date: 06/15/2021 CLINICAL DATA:  Altered mental status. EXAM: CT HEAD WITHOUT CONTRAST TECHNIQUE: Contiguous axial images were obtained from the base of the skull through the vertex without intravenous contrast. COMPARISON:  Head CT 10/02/2020 FINDINGS: Brain: Normal for age atrophy. No intracranial hemorrhage, mass effect, or midline shift. No hydrocephalus. The basilar cisterns are patent. No evidence of territorial infarct or acute ischemia. No extra-axial or intracranial fluid collection. Vascular: No hyperdense vessel or unexpected calcification. Skull: No fracture or focal lesion. Sinuses/Orbits: Paranasal sinuses and mastoid air cells are clear. The visualized orbits are unremarkable. Bilateral cataract resection. Other: None IMPRESSION: Negative head CT. Electronically Signed   By: Keith Rake M.D.   On: 06/15/2021 18:44   [place] EKG SITE RITE  Result Date: 06/16/2021 If Site Rite image not attached, placement could not be confirmed due to current cardiac rhythm.   Microbiology: Recent Results (from the past 240 hour(s))  Urine Culture     Status: Abnormal (Preliminary result)   Collection Time: 06/15/21  8:48 PM   Specimen: In/Out Cath Urine  Result Value Ref Range Status   Specimen Description   Final    IN/OUT CATH URINE Performed at [facility], [place] [address], [place], [place] [address]    Special Requests   Final    NONE Performed at [facility], [place] [address], [place], [place] [address]    Culture (A)  Final    >=100,000 COLONIES/mL ESCHERICHIA COLI SUSCEPTIBILITIES TO FOLLOW Performed at [facility], [place] [address], [place], [place] [address]    Report Status PENDING  Incomplete  Resp Panel by RT-PCR (Flu A&B, Covid) Nasopharyngeal Swab     Status: None   Collection Time: 06/15/21  8:48 PM   Specimen: Nasopharyngeal Swab; Nasopharyngeal(NP) swabs in vial transport medium   Result Value Ref Range Status   SARS Coronavirus 2 by RT PCR NEGATIVE NEGATIVE Final    Comment: (NOTE) SARS-CoV-2 target nucleic acids are NOT DETECTED.  The SARS-CoV-2 RNA is generally detectable in upper respiratory specimens during the acute phase of infection. The lowest concentration of SARS-CoV-2 viral copies this assay can detect is 138 copies/mL. A negative result does not preclude SARS-Cov-2 infection and should not be used as the sole basis for treatment or other patient management decisions. A negative result may occur with  improper specimen collection/handling, submission of specimen other than nasopharyngeal swab, presence of viral mutation(s)  within the areas targeted by this assay, and inadequate number of viral copies(<138 copies/mL). A negative result must be combined with clinical observations, patient history, and epidemiological information. The expected result is Negative.  Fact Sheet for Patients:  EntrepreneurPulse.com.au  Fact Sheet for Healthcare Providers:  IncredibleEmployment.be  This test is no t yet approved or cleared by the [place] FDA and  has been authorized for detection and/or diagnosis of SARS-CoV-2 by FDA under an Emergency Use Authorization (EUA). This EUA will remain  in effect (meaning this test can be used) for the duration of the COVID-19 declaration under Section 564(b)(1) of the Act, 21 U.S.C.section 360bbb-3(b)(1), unless the authorization is terminated  or revoked sooner.       Influenza A by PCR NEGATIVE NEGATIVE Final   Influenza B by PCR NEGATIVE NEGATIVE Final    Comment: (NOTE) The Xpert Xpress SARS-CoV-2/FLU/RSV plus assay is intended as an aid in the diagnosis of influenza from Nasopharyngeal swab specimens and should not be used as a sole basis for treatment. Nasal washings and aspirates are unacceptable for Xpert Xpress SARS-CoV-2/FLU/RSV testing.  Fact Sheet for  Patients: EntrepreneurPulse.com.au  Fact Sheet for Healthcare Providers: IncredibleEmployment.be  This test is not yet approved or cleared by the [place] FDA and has been authorized for detection and/or diagnosis of SARS-CoV-2 by FDA under an Emergency Use Authorization (EUA). This EUA will remain in effect (meaning this test can be used) for the duration of the COVID-19 declaration under Section 564(b)(1) of the Act, 21 U.S.C. section 360bbb-3(b)(1), unless the authorization is terminated or revoked.  Performed at [facility], [place] [address] [place], [place] [address]      Labs: Basic Metabolic Panel: Recent Labs  Lab 06/15/21 1832 06/16/21 0312 06/17/21 0317 06/18/21 0353  NA 156* 155* 139 142  K 3.8 4.0 3.5 3.8  CL 118* 120* 107 107  CO2 $Re'30 29 28 30  'DuB$ GLUCOSE 189* 130* 370* 184*  BUN 24* $Remov'21 17 21  'zrGqJo$ CREATININE 0.99 0.69 0.73 0.67  CALCIUM 10.4* 9.5 8.6* 8.8*  MG  --   --  1.9  --    Liver Function Tests: Recent Labs  Lab 06/15/21 1832 06/16/21 0312  AST 25 24  ALT 22 17  ALKPHOS 90 66  BILITOT 0.7 0.8  PROT 8.6* 7.0  ALBUMIN 4.3 3.4*   No results for input(s): LIPASE, AMYLASE in the last 168 hours. No results for input(s): AMMONIA in the last 168 hours. CBC: Recent Labs  Lab 06/15/21 1832 06/16/21 0312 06/17/21 0317  WBC 11.4* 11.6* 6.0  NEUTROABS 8.0*  --   --   HGB 15.4* 13.8 12.3  HCT 51.2* 44.8 38.8  MCV 101.2* 100.0 97.7  PLT 239 125* 158   Cardiac Enzymes: No results for input(s): CKTOTAL, CKMB, CKMBINDEX, TROPONINI in the last 168 hours. BNP: BNP (last 3 results) No results for input(s): BNP in the last 8760 hours.  ProBNP (last 3 results) No results for input(s): PROBNP in the last 8760 hours.  CBG: Recent Labs  Lab 06/17/21 0731 06/17/21 1113 06/17/21 1639 06/18/21 0014 06/18/21 0735  GLUCAP 332* 216* 284* 150* 194*       Signed:  Irine Seal MD.  [facility] 06/18/2021, 12:02 PM

## 2021-06-18 NOTE — TOC Progression Note (Addendum)
Transition of Care [facility]) - Progression Note    Patient Details  Name: Mary Swanson MRN: [id number] Date of Birth: [birth year]  Transition of Care [facility]) CM/SW Contact  Darleene Cleaver, [place] Phone Number: 06/18/2021, 4:03 PM  Clinical Narrative:     CSW contacted Wilkie Aye RN [phone] at [facility] care.  CSW asked if patient is able to discharge today, because per MD she is medically ready for discharge.  Per Wilkie Aye, there is not a nurse available to accept patient today, but she will go in tomorrow to accept patient.  CSW updated attending physician and bedside nurse.  CSW asked if a new Covid test is needed, since the current one is dated 7/20 and she said no that one will work.  CSW to facilitate discharge planning tomorrow.    Expected Discharge Plan: Assisted Living ([facility] to [facility]) Barriers to Discharge: Continued Medical Work up  Expected Discharge Plan and Services Expected Discharge Plan: Assisted Living (return to [facility]) In-house Referral: Clinical Social Work     Living arrangements for the past 2 months: Assisted Living Facility Expected Discharge Date: 06/18/21                                     Social Determinants of Health (SDOH) Interventions    Readmission Risk Interventions No flowsheet data found.

## 2021-06-19 DIAGNOSIS — N390 Urinary tract infection, site not specified: Secondary | ICD-10-CM | POA: Diagnosis not present

## 2021-06-19 DIAGNOSIS — E870 Hyperosmolality and hypernatremia: Secondary | ICD-10-CM | POA: Diagnosis not present

## 2021-06-19 DIAGNOSIS — G300 Alzheimer's disease with early onset: Secondary | ICD-10-CM | POA: Diagnosis not present

## 2021-06-19 DIAGNOSIS — G9341 Metabolic encephalopathy: Secondary | ICD-10-CM | POA: Diagnosis not present

## 2021-06-19 LAB — GLUCOSE, CAPILLARY
Glucose-Capillary: 214 mg/dL — ABNORMAL HIGH (ref 70–99)
Glucose-Capillary: 311 mg/dL — ABNORMAL HIGH (ref 70–99)

## 2021-06-19 MED ORDER — INSULIN GLARGINE 100 UNIT/ML ~~LOC~~ SOLN
18.0000 [IU] | Freq: Every day | SUBCUTANEOUS | Status: DC
  Filled 2021-06-19: qty 0.18

## 2021-06-19 NOTE — Progress Notes (Signed)
PROGRESS NOTE    Mary Swanson  AJO:[id number] DOB: 1956/01/15 DOA: 06/15/2021 PCP: Lavonda Jumbo, DO    Chief Complaint  Patient presents with   Hyperglycemia   Altered Mental Status    Brief Narrative:  Patient is 65 year old female nursing home resident, history of diabetes hypertension dementia presented to the ED for altered mental status.  It is noted that at nursing care facility where patient resides staff reported patient was more sleepy than usual, more confused, less interactive noted to have blood glucose levels in the 400s all week.  Patient noted to have similar symptoms from prior UTIs.  Patient sent to the ED for further evaluation and management.  Urinalysis concerning for [facility] UTI.  Patient also noted to be dehydrated with severe hypernatremia.  Patient placed on IV antibiotics, cultures obtained and pending, IV fluids ordered however patient pulled out IV access and as such awaiting PICC line placement.   Assessment & Plan:   Principal Problem:   Acute metabolic encephalopathy Active Problems:   Hypernatremia   History of depression   Diabetes mellitus without complication (HCC)   Memory loss of unknown cause   Dehydration   Essential hypertension   Acute lower UTI   Alzheimer's dementia with behavioral disturbance (HCC)   1 acute metabolic encephalopathy -Likely multifactorial secondary to hypovolemic hypernatremia in the setting of UTI and underlying dementia. -Urine cultures > 100,000 colonies of E. coli.   -Improving clinically.   -Patient alert, pleasantly confused.   -Saline lock IV fluids.   -Status post 3 days IV Rocephin.   -Supportive care.   2.  Hypovolemic Hypernatremia -Likely secondary to volume depletion and dehydration. -Urinalysis concerning for UTI. -Urine cultures > 100,000 colonies of E. coli with sensitivities pending. -COVID-19 PCR negative. -Hypernatremia has improved.   -Follow.   3.  Dehydration -Hydrated with IV  fluids.    4.  E. coli UTI -Urine cultures > 100,000 colonies of E. coli which was resistant to ampicillin but sensitive to cephalosporins, fluoroquinolones, gentamicin, imipenem, Macrobid, Bactrim, Zosyn.   -Status post 3 days IV Rocephin.   -No further antibiotics needed.    5.  Dementia -Patient noted to have some bouts of agitation. -Continue home regimen Ativan twice daily.   -Continue Seroquel 25 mg p.o. twice daily as needed agitation.  -Continue thiamine.  6.  Hypertension -Continue Norvasc 10 mg daily.   -IV hydralazine as needed.    7.  Diabetes mellitus type 2 -Hemoglobin A1c 11.2 (06/17/2021). -CBG at 214 this morning. -D5W discontinued. -Patient noted to have a CBG of 66 the evening of 06/15/2021. -Increase Lantus to 18 units daily.  SSI.    8.  Hyperlipidemia -Statin.     DVT prophylaxis: Lovenox Code Status: Full Family Communication: No family at bedside. Disposition:   Status is: Inpatient  Remains inpatient appropriate because:IV treatments appropriate due to intensity of illness or inability to take PO  Dispo: The patient is from: SNF              Anticipated d/c is to: SNF              Patient currently is medically stable for discharge.   Difficult to place patient No       Consultants:  None  Procedures:  CT head 06/15/2021 Chest x-ray 06/15/2021  Antimicrobials:  IV Rocephin 06/16/2021>>>> 06/18/2021   Subjective: Sitting up in recliner with activity blanket on the table.  Denies chest pain.  No shortness of breath.  No abdominal pain.  Pleasantly confused.   Patient was to be discharged yesterday however it was noted that there was no RN at facility to accept patient yesterday.   Objective: Vitals:   06/18/21 0648 06/18/21 1327 06/18/21 2204 06/19/21 0525  BP: (!) 160/74 (!) 154/75 132/77 (!) 144/70  Pulse: (!) 55 61 61 (!) 53  Resp: 16 18 17 17   Temp: 97.9 F (36.6 C) 97.6 F (36.4 C) 98.6 F (37 C) 97.8 F (36.6 C)   TempSrc: Oral Axillary Oral Oral  SpO2: 99% 94% 98% 93%  Weight:      Height:        Intake/Output Summary (Last 24 hours) at 06/19/2021 1128 Last data filed at 06/19/2021 0700 Gross per 24 hour  Intake 360 ml  Output 1450 ml  Net -1090 ml    Filed Weights   06/17/21 2050  Weight: 81.3 kg    Examination:  General exam: : NAD Respiratory system: CTA B anterior lung fields.  No wheezes, no rhonchi.  Speaking in full sentences.  Normal respiratory effort. Cardiovascular system: Regular rate and rhythm no murmurs rubs or gallops.  No JVD.  No lower extremity edema.  Gastrointestinal system: Abdomen soft, nontender, nondistended, positive bowel sounds.  No rebound.  No guarding. Central nervous system: Alert and oriented. No focal neurological deficits. Extremities: Symmetric 5 x 5 power. Skin: No rashes, lesions or ulcers Psychiatry: Judgement and insight appear poor. Mood & affect appropriate.  Data Reviewed: I have personally reviewed following labs and imaging studies  CBC: Recent Labs  Lab 06/15/21 1832 06/16/21 0312 06/17/21 0317  WBC 11.4* 11.6* 6.0  NEUTROABS 8.0*  --   --   HGB 15.4* 13.8 12.3  HCT 51.2* 44.8 38.8  MCV 101.2* 100.0 97.7  PLT 239 125* 158     Basic Metabolic Panel: Recent Labs  Lab 06/15/21 1832 06/16/21 0312 06/17/21 0317 06/18/21 0353  NA 156* 155* 139 142  K 3.8 4.0 3.5 3.8  CL 118* 120* 107 107  CO2 30 29 28 30   GLUCOSE 189* 130* 370* 184*  BUN 24* 21 17 21   CREATININE 0.99 0.69 0.73 0.67  CALCIUM 10.4* 9.5 8.6* 8.8*  MG  --   --  1.9  --      GFR: Estimated Creatinine Clearance: 74.4 mL/min (by C-G formula based on SCr of 0.67 mg/dL).  Liver Function Tests: Recent Labs  Lab 06/15/21 1832 06/16/21 0312  AST 25 24  ALT 22 17  ALKPHOS 90 66  BILITOT 0.7 0.8  PROT 8.6* 7.0  ALBUMIN 4.3 3.4*     CBG: Recent Labs  Lab 06/18/21 0735 06/18/21 1202 06/18/21 1645 06/18/21 2201 06/19/21 0739  GLUCAP 194* 262*  177* 223* 214*      Recent Results (from the past 240 hour(s))  Urine Culture     Status: Abnormal   Collection Time: 06/15/21  8:48 PM   Specimen: In/Out Cath Urine  Result Value Ref Range Status   Specimen Description   Final    IN/OUT CATH URINE Performed at [facility], [address], [place], [place]    Special Requests   Final    NONE Performed at [facility], [address] [place], [place]    Culture >=100,000 COLONIES/mL ESCHERICHIA COLI (A)  Final   Report Status 06/18/2021 FINAL  Final   Organism ID, Bacteria ESCHERICHIA COLI (A)  Final      Susceptibility  Escherichia coli - MIC*    AMPICILLIN >=32 RESISTANT Resistant     CEFAZOLIN <=4 SENSITIVE Sensitive     CEFEPIME <=0.12 SENSITIVE Sensitive     CEFTRIAXONE <=0.25 SENSITIVE Sensitive     CIPROFLOXACIN <=0.25 SENSITIVE Sensitive     GENTAMICIN <=1 SENSITIVE Sensitive     IMIPENEM <=0.25 SENSITIVE Sensitive     NITROFURANTOIN <=16 SENSITIVE Sensitive     TRIMETH/SULFA <=20 SENSITIVE Sensitive     AMPICILLIN/SULBACTAM <=2 SENSITIVE Sensitive     PIP/TAZO <=4 SENSITIVE Sensitive     * >=100,000 COLONIES/mL ESCHERICHIA COLI  Resp Panel by RT-PCR (Flu A&B, Covid) Nasopharyngeal Swab     Status: None   Collection Time: 06/15/21  8:48 PM   Specimen: Nasopharyngeal Swab; Nasopharyngeal(NP) swabs in vial transport medium  Result Value Ref Range Status   SARS Coronavirus 2 by RT PCR NEGATIVE NEGATIVE Final    Comment: (NOTE) SARS-CoV-2 target nucleic acids are NOT DETECTED.  The SARS-CoV-2 RNA is generally detectable in upper respiratory specimens during the acute phase of infection. The lowest concentration of SARS-CoV-2 viral copies this assay can detect is 138 copies/mL. A negative result does not preclude SARS-Cov-2 infection and should not be used as the sole basis for treatment or other patient management decisions. A negative result may occur  with  improper specimen collection/handling, submission of specimen other than nasopharyngeal swab, presence of viral mutation(s) within the areas targeted by this assay, and inadequate number of viral copies(<138 copies/mL). A negative result must be combined with clinical observations, patient history, and epidemiological information. The expected result is Negative.  Fact Sheet for Patients:  BloggerCourse.com  Fact Sheet for Healthcare Providers:  SeriousBroker.it  This test is no t yet approved or cleared by the [place] FDA and  has been authorized for detection and/or diagnosis of SARS-CoV-2 by FDA under an Emergency Use Authorization (EUA). This EUA will remain  in effect (meaning this test can be used) for the duration of the COVID-19 declaration under Section 564(b)(1) of the Act, 21 U.S.C.section 360bbb-3(b)(1), unless the authorization is terminated  or revoked sooner.       Influenza A by PCR NEGATIVE NEGATIVE Final   Influenza B by PCR NEGATIVE NEGATIVE Final    Comment: (NOTE) The Xpert Xpress SARS-CoV-2/FLU/RSV plus assay is intended as an aid in the diagnosis of influenza from Nasopharyngeal swab specimens and should not be used as a sole basis for treatment. Nasal washings and aspirates are unacceptable for Xpert Xpress SARS-CoV-2/FLU/RSV testing.  Fact Sheet for Patients: BloggerCourse.com  Fact Sheet for Healthcare Providers: SeriousBroker.it  This test is not yet approved or cleared by the [place] FDA and has been authorized for detection and/or diagnosis of SARS-CoV-2 by FDA under an Emergency Use Authorization (EUA). This EUA will remain in effect (meaning this test can be used) for the duration of the COVID-19 declaration under Section 564(b)(1) of the Act, 21 U.S.C. section 360bbb-3(b)(1), unless the authorization is terminated  or revoked.  Performed at [facility], [address], [place], [place] [address]           Radiology Studies: No results found.      Scheduled Meds:  amLODipine  10 mg Oral Daily   aspirin  81 mg Oral Daily   enoxaparin (LOVENOX) injection  40 mg Subcutaneous Q24H   insulin aspart  0-15 Units Subcutaneous TID WC   insulin glargine  18 Units Subcutaneous QHS   LORazepam  0.5 mg Oral BID  multivitamin with minerals  1 tablet Oral Daily   rosuvastatin  20 mg Oral QHS   thiamine  100 mg Oral Daily   Continuous Infusions:     LOS: 4 days    Time spent: 35 minutes    Ramiro Harvestaniel Josely Moffat, MD [facility]   To contact the attending provider between 7A-7P or the covering provider during after hours 7P-7A, please log into the web site [email or web address] and access using universal [place] password for that web site. If you do not have the password, please call the hospital operator.  06/19/2021, 11:28 AM

## 2021-06-19 NOTE — Plan of Care (Signed)
  Problem: Education: Goal: Knowledge of General Education information will improve Description Including pain rating scale, medication(s)/side effects and non-pharmacologic comfort measures Outcome: Progressing   Problem: Activity: Goal: Risk for activity intolerance will decrease Outcome: Progressing   Problem: Safety: Goal: Ability to remain free from injury will improve Outcome: Progressing   

## 2021-06-19 NOTE — Plan of Care (Signed)
Patient dc'd  

## 2021-06-19 NOTE — TOC Transition Note (Signed)
Transition of Care [facility]) - CM/SW Discharge Note   Patient Details  Name: Julitza Rickles MRN: [id number] Date of Birth: 1956-04-22  Transition of Care [facility]) CM/SW Contact:  Aerianna Losey, Vinnie Langton, LCSW Phone Number: 06/19/2021, 12:30 PM   Clinical Narrative:     [facility] to confirm that patient is able to return today.  Contacted friends, [name] and [name] to notify them of patient's discharge back to the facility.  [facility] has been arranged for transport.  LCSW will sign off.    Final next level of care: Assisted Living (Memory Care) Barriers to Discharge: Continued Medical Work up   Patient Goals and CMS Choice Patient states their goals for this hospitalization and ongoing recovery are:: Back to [facility]F CMS Medicare.gov Compare Post Acute Care list provided to:: Patient Choice offered to / list presented to : NA  Discharge Placement  Return to [facility]F.                Patient to be transferred to facility by: [facility] Name of family member notified: Friends - [name] and [name] Patient and family notified of of transfer: 06/19/21  Discharge Plan and Services In-house Referral: Clinical Social Work                                   Social Determinants of Health (SDOH) Interventions     Readmission Risk Interventions No flowsheet data found.

## 2021-06-19 NOTE — TOC Progression Note (Signed)
Transition of Care [facility]) - Progression Note    Patient Details  Name: Mary Swanson MRN: [id number] Date of Birth: 08/30/1956  Transition of Care [facility]) CM/SW Contact  Darleene Cleaver, [place] Phone Number: 06/19/2021, 1:29 PM  Clinical Narrative:     CSW spoke to [facility], and they can accept patient today, and do not need a report called to them.   Expected Discharge Plan: Assisted Living (return to [facility]) Barriers to Discharge: Continued Medical Work up  Expected Discharge Plan and Services Expected Discharge Plan: Assisted Living ([facility] to [facility]) In-house Referral: Clinical Social Work     Living arrangements for the past 2 months: Assisted Living Facility Expected Discharge Date: 06/19/21                                     Social Determinants of Health (SDOH) Interventions    Readmission Risk Interventions No flowsheet data found.

## 2021-06-19 NOTE — Progress Notes (Signed)
Verified, printed prescriptions sent in envelope with transprt

## 2021-06-19 NOTE — NC FL2 (Signed)
[place] LEVEL OF CARE SCREENING TOOL     IDENTIFICATION  Patient Name: Mary Swanson Birthdate: 1956-05-07 Sex: female Admission Date (Current Location): 06/15/2021  [facility] [place] Number:  Herbalist and Address:  [facility],  [place], [place]      Provider Number: [id number]  Attending Physician Name and Address:  Eugenie Filler, MD  Relative Name and Phone Number:  Levester Fresh and [name]    Current Level of Care: Hospital Recommended Level of Care: [place], Memory Care Prior Approval Number:    Date Approved/Denied:   PASRR Number:    Discharge Plan: Other (Comment) (Memory Care ALF)    Current Diagnoses: Patient Active Problem List   Diagnosis Date Noted   Hypernatremia 06/16/2021   Acute lower UTI 06/16/2021   Alzheimer's dementia with behavioral disturbance ([place])    Altered mental status 06/15/2021   Hospital discharge follow-up 01/02/2020   Abnormal CXR    Essential hypertension    Acute metabolic encephalopathy 07/62/2633   Diabetes mellitus ([place]) 12/28/2019   Hyperosmolar hyperglycemic state (HHS) ([place]) 12/27/2019   AKI (acute kidney injury) ([place]) 12/27/2019   Dehydration 12/27/2019   Elevated blood pressure reading 12/03/2019   Memory loss of unknown cause 12/03/2019   Hyperglycemia 12/03/2019   ACE-inhibitor cough 03/14/2019   Diabetes mellitus without complication (HCC)    Heart murmur, systolic 35/45/6256   DIABETES MELLITUS II, UNCOMPLICATED 38/93/7342   Hyperlipidemia 01/24/2007   OBESITY, NOS 01/24/2007   Anemia 01/24/2007   History of depression 01/24/2007   RHINITIS, ALLERGIC 01/24/2007   Type 2 diabetes mellitus with hyperglycemia ([place]) 01/24/2007    Orientation RESPIRATION BLADDER Height & Weight     Self, Time, Situation, Place  Normal Incontinent Weight: 179 lb 3.7 oz (81.3 kg) Height:  _0  (167.6 cm)  BEHAVIORAL SYMPTOMS/MOOD  NEUROLOGICAL BOWEL NUTRITION STATUS      Continent Diet (Carb modified)  AMBULATORY STATUS COMMUNICATION OF NEEDS Skin   Supervision (Ambulated 280 feet.) Verbally Normal                       Personal Care Assistance Level of Assistance  Bathing, Feeding, Dressing Bathing Assistance: Limited assistance Feeding assistance: Independent Dressing Assistance: Limited assistance     Functional Limitations Info  Sight, Hearing, Speech Sight Info: Adequate Hearing Info: Adequate Speech Info: Adequate    SPECIAL CARE FACTORS FREQUENCY        PT Frequency: N/A OT Frequency: N/A            Contractures Contractures Info: Not present    Additional Factors Info  Code Status, Allergies, Psychotropic, Insulin Sliding Scale Code Status Info: Full Allergies Info: ErythromycinDiarrhea  LatexHives  LisinoprilCough Psychotropic Info: LORazepam 0.5 MG tablet Insulin Sliding Scale Info: insulin aspart 100 UNIT/ML injection       Current Medications (06/19/2021):  This is the current hospital active medication list Current Facility-Administered Medications  Medication Dose Route Frequency Provider Last Rate Last Admin   acetaminophen (TYLENOL) tablet 650 mg  650 mg Oral Q6H PRN Hugelmeyer, Alexis, DO   650 mg at 06/18/21 2235   Or   acetaminophen (TYLENOL) suppository 650 mg  650 mg Rectal Q6H PRN Hugelmeyer, Alexis, DO       amLODipine (NORVASC) tablet 10 mg  10 mg Oral Daily Hugelmeyer, Alexis, DO   10 mg at 06/19/21 0857   aspirin chewable tablet 81 mg  81  mg Oral Daily Hugelmeyer, Alexis, DO   81 mg at 06/19/21 0856   bisacodyl (DULCOLAX) EC tablet 5 mg  5 mg Oral Daily PRN Hugelmeyer, Alexis, DO       enoxaparin (LOVENOX) injection 40 mg  40 mg Subcutaneous Q24H Hugelmeyer, Alexis, DO   40 mg at 06/19/21 0857   haloperidol lactate (HALDOL) injection 2 mg  2 mg Intravenous Q6H PRN Eugenie Filler, MD   2 mg at 06/17/21 2049   hydrALAZINE (APRESOLINE) injection 10 mg  10 mg  Intravenous Q6H PRN Eugenie Filler, MD       insulin aspart (novoLOG) injection 0-15 Units  0-15 Units Subcutaneous TID WC Eugenie Filler, MD   11 Units at 06/19/21 1203   insulin glargine (LANTUS) injection 18 Units  18 Units Subcutaneous QHS Eugenie Filler, MD       LORazepam (ATIVAN) tablet 0.5 mg  0.5 mg Oral BID Eugenie Filler, MD   0.5 mg at 06/19/21 0857   magnesium citrate solution 1 Bottle  1 Bottle Oral Once PRN Hugelmeyer, Alexis, DO       multivitamin with minerals tablet 1 tablet  1 tablet Oral Daily Eugenie Filler, MD   1 tablet at 06/19/21 0857   ondansetron (ZOFRAN) tablet 4 mg  4 mg Oral Q6H PRN Hugelmeyer, Alexis, DO       Or   ondansetron (ZOFRAN) injection 4 mg  4 mg Intravenous Q6H PRN Hugelmeyer, Alexis, DO       QUEtiapine (SEROQUEL) tablet 25 mg  25 mg Oral BID PRN Eugenie Filler, MD   25 mg at 06/19/21 0856   rosuvastatin (CRESTOR) tablet 20 mg  20 mg Oral QHS Hugelmeyer, Alexis, DO   20 mg at 06/18/21 2236   senna-docusate (Senokot-S) tablet 1 tablet  1 tablet Oral QHS PRN Hugelmeyer, Alexis, DO       thiamine tablet 100 mg  100 mg Oral Daily Hugelmeyer, Alexis, DO   100 mg at 06/19/21 0857     Discharge Medications:   STOP taking these medications     Ensure Max Protein Liqd    feeding supplement (PRO-STAT SUGAR FREE 64) Liqd    insulin aspart 100 UNIT/ML injection Commonly known as: novoLOG    mupirocin ointment 2 % Commonly known as: BACTROBAN           TAKE these medications     acetaminophen 325 MG tablet Commonly known as: TYLENOL Take 2 tablets (650 mg total) by mouth every 6 (six) hours as needed for mild pain (or Fever >/= 101).    AgaMatrix Presto Test test strip Generic drug: glucose blood Check blood glucose 3 times daily before meals and as needed    AgaMatrix Presto w/Device Kit Check blood glucose three times daily before meals and as needed    AgaMatrix Ultra-Thin Lancets Misc Check blood glucose 3 times  daily before meals and as needed    amLODipine 10 MG tablet Commonly known as: NORVASC Take 1 tablet (10 mg total) by mouth daily.    aspirin 81 MG chewable tablet Chew 81 mg by mouth daily.    glucose 4 GM chewable tablet Chew 1 tablet by mouth as needed for low blood sugar.    insulin lispro 100 UNIT/ML KwikPen Commonly known as: HUMALOG Inject 2-12 Units into the skin See admin instructions. Inject 2-12 units into the skin three times a day, per sliding scale: BGL 151-200 = 2 units; 201-250 = 3  units; 251-300 = 4 units; 301-350 = 6 units; 351-400 = 10 units; >401 = 12 units and CALL NP    Insulin Pen Needle 32G X 4 MM Misc Commonly known as: BD Pen Needle Nano U/F 1 Stick by Does not apply route every morning.    Lantus SoloStar 100 UNIT/ML Solostar Pen Generic drug: insulin glargine Inject 18 Units into the skin daily. What changed: how much to take when to take this    LORazepam 0.5 MG tablet Commonly known as: ATIVAN Take 1 tablet (0.5 mg total) by mouth in the morning and at bedtime.    metFORMIN 500 MG 24 hr tablet Commonly known as: GLUCOPHAGE-XR Take 1,000 mg by mouth daily with breakfast.    multivitamin with minerals Tabs tablet Take 1 tablet by mouth daily.    QUEtiapine 25 MG tablet Commonly known as: SEROQUEL Take 1 tablet (25 mg total) by mouth 2 (two) times daily as needed (agitation).    rosuvastatin 20 MG tablet Commonly known as: CRESTOR Take 20 mg by mouth at bedtime.    thiamine 100 MG tablet Take 1 tablet (100 mg total) by mouth daily.      Relevant Imaging Results:  Relevant Lab Results:   Additional Information SS#: [id number]  Ida Uppal, Marta Lamas, LCSW

## 2021-06-22 DIAGNOSIS — W19XXXA Unspecified fall, initial encounter: Secondary | ICD-10-CM | POA: Diagnosis not present

## 2021-06-22 DIAGNOSIS — I10 Essential (primary) hypertension: Secondary | ICD-10-CM | POA: Diagnosis not present

## 2021-06-29 DIAGNOSIS — E1165 Type 2 diabetes mellitus with hyperglycemia: Secondary | ICD-10-CM | POA: Diagnosis not present

## 2021-06-29 DIAGNOSIS — Z794 Long term (current) use of insulin: Secondary | ICD-10-CM | POA: Diagnosis not present

## 2021-07-06 ENCOUNTER — Telehealth: Payer: Self-pay

## 2021-07-06 NOTE — Telephone Encounter (Signed)
(  4:01 pm)  Palliative care SW left a message for patient's PCG's-Phyllis and [name], [phone], advising of order/request for palliative care services. SW requested a call back to schedule initial visit.

## 2021-07-19 ENCOUNTER — Non-Acute Institutional Stay: Payer: Medicare Other | Admitting: *Deleted

## 2021-07-19 ENCOUNTER — Non-Acute Institutional Stay: Payer: Medicare Other

## 2021-07-19 ENCOUNTER — Other Ambulatory Visit: Payer: Self-pay

## 2021-07-19 VITALS — BP 149/79 | HR 76 | Temp 97.7°F | Resp 17

## 2021-07-19 DIAGNOSIS — Z515 Encounter for palliative care: Secondary | ICD-10-CM

## 2021-07-20 DIAGNOSIS — Z794 Long term (current) use of insulin: Secondary | ICD-10-CM | POA: Diagnosis not present

## 2021-07-20 DIAGNOSIS — E1165 Type 2 diabetes mellitus with hyperglycemia: Secondary | ICD-10-CM | POA: Diagnosis not present

## 2021-07-20 NOTE — Progress Notes (Signed)
COMMUNITY PALLIATIVE CARE SW NOTE  PATIENT NAME: Mary Swanson DOB: [birth year] MRN: [id number]  PRIMARY CARE PROVIDER: Lavonda Jumbo, DO  RESPONSIBLE PARTY:  Acct ID - Guarantor Home Phone Work Phone Relationship Acct Type  [id number] - Mary Swanson [phone]  Self P/F     [address], [place], [place] [address]     PLAN OF CARE and INTERVENTIONS:             GOALS OF CARE/ ADVANCE CARE PLANNING:  Goal is for patient to remain in the facility through the end of life. Patient is a Full Code. SOCIAL/EMOTIONAL/SPIRITUAL ASSESSMENT/ INTERVENTIONS:  SW and RN-M. Dimas Aguas completed an initial visit with patient at the facility. Patient was sitting in the dinning room, napping. She easily aroused to verbal prompt and was responsive with 1/2 words. Patient initially had a flat affect, but later seemed to warm up to the team by smiling and giving eye contact. She indicated that she was not having any pain or discomfort. Patient is pleasantly confused as she would respond to the same questions the same way. The team later observed th patient up and ambulating around the facility. Patient requires assistance with personal car needs. Patient is a full code. She has a court appointed guardian. The team consulted with facility executive director and nurse, who expressed concern regarding patient's diabetes, that has been challenging to manage. Patient's appetite is good. SW provided a supportive presence, verbal stimulation/social stimulation, rapport-building, assessment of needs and comfort, consult with staff.  PATIENT/CAREGIVER EDUCATION/ COPING:  Patient appears to be coping well.  PERSONAL EMERGENCY PLAN:  Per facility protocol.  COMMUNITY RESOURCES COORDINATION/ HEALTH CARE NAVIGATION:  None. FINANCIAL/LEGAL CONCERNS/INTERVENTIONS:  None     SOCIAL HX:  Social History   Tobacco Use   Smoking status: Never   Smokeless tobacco: Never  Substance Use Topics   Alcohol use: Not Currently     CODE STATUS: To be assessed ADVANCED DIRECTIVES: No MOST FORM COMPLETE:  No HOSPICE EDUCATION PROVIDED: No  PPS: Patient is pleasantly confused. She is mostly independent for personal care, but needs reminding and encouragement.   Duration of visit and documentation: 45 minutes.  [address] [place], [place]

## 2021-07-21 IMAGING — CR DG FOOT COMPLETE 3+V*R*
3 series · 3 of 3 positions shown · non-contrast
Comparison: None.

CLINICAL DATA: Sepsis, foot wound

EXAM:
RIGHT FOOT COMPLETE - 3+ VIEW

[x foot lat right (1 of 3)]
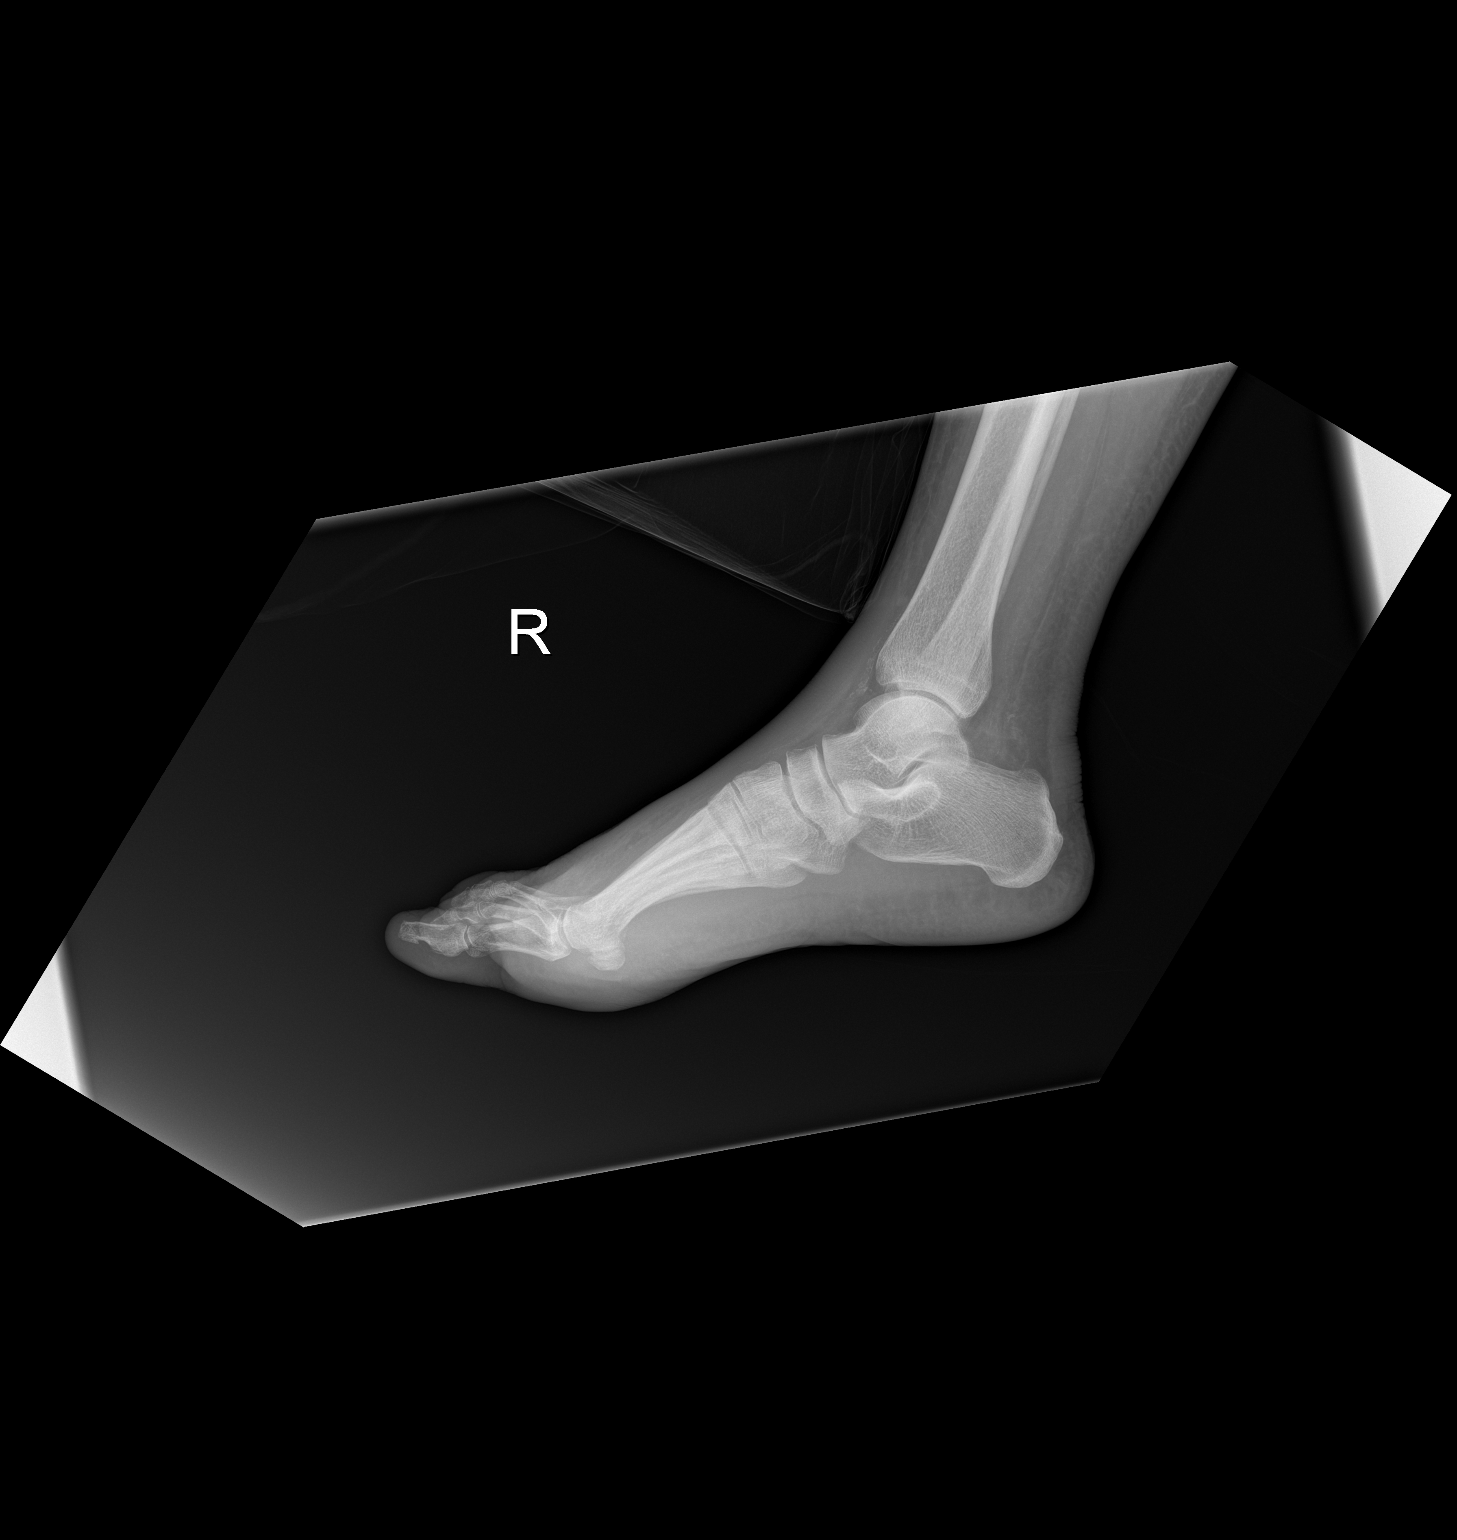

[x foot lat right (2 of 3)]
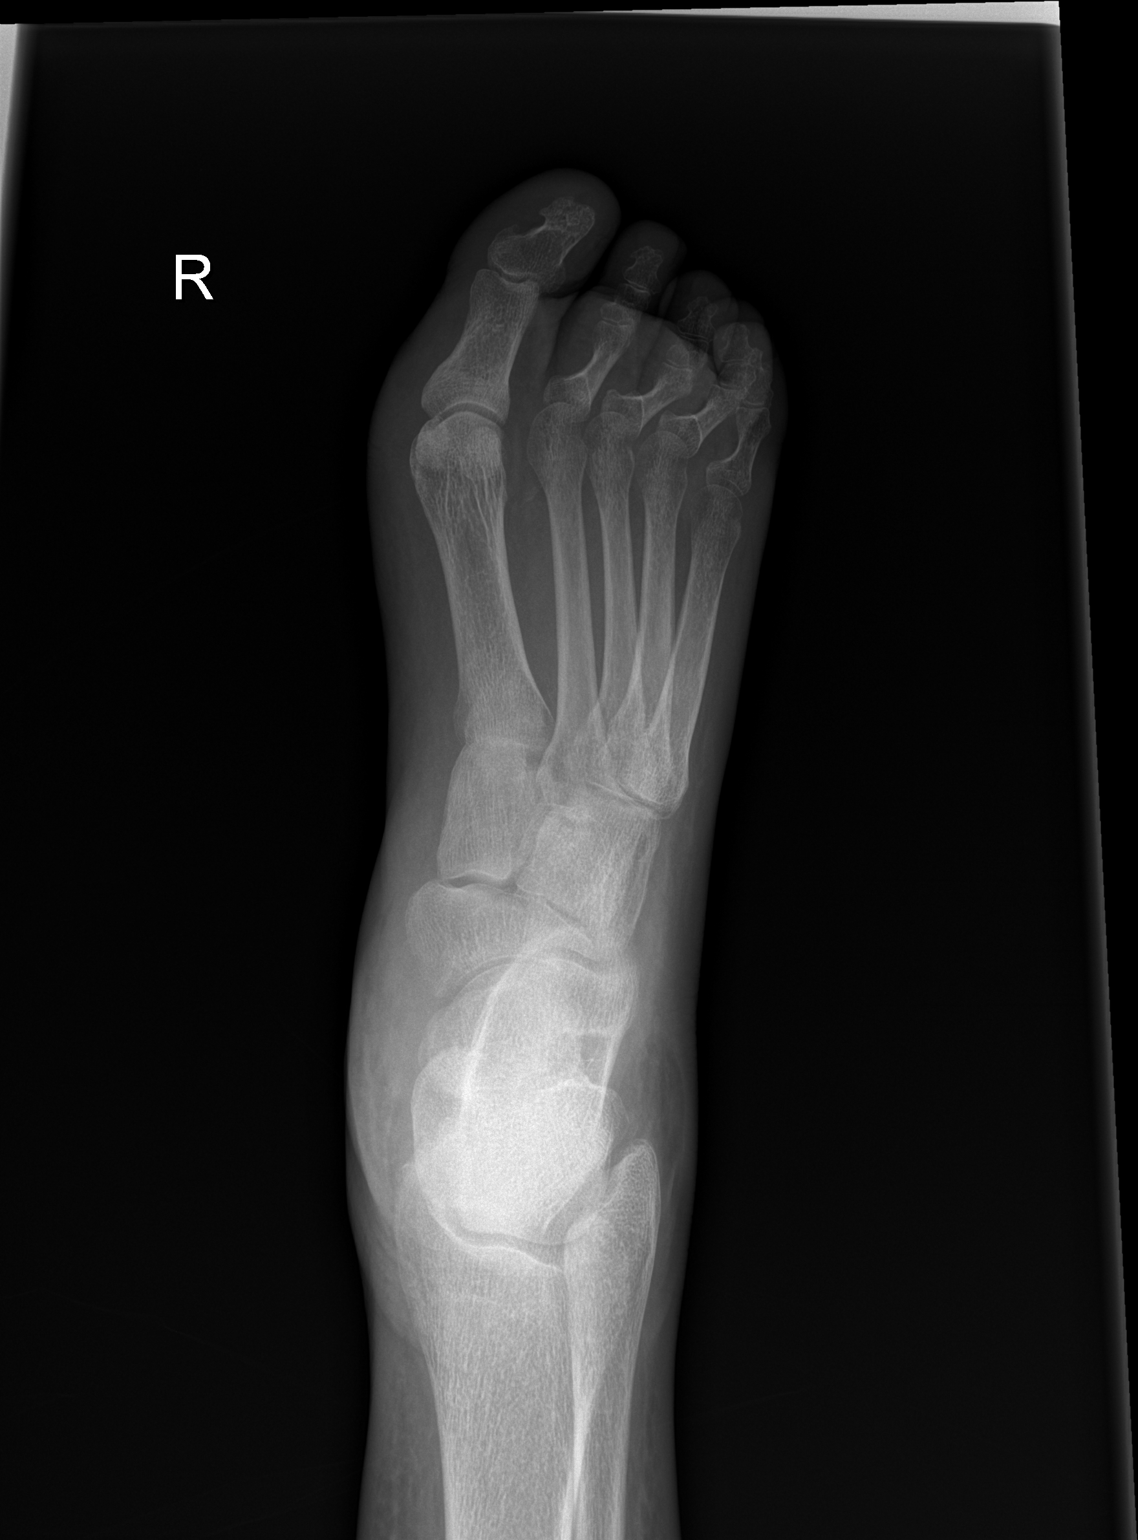

[x foot lat right (3 of 3)]
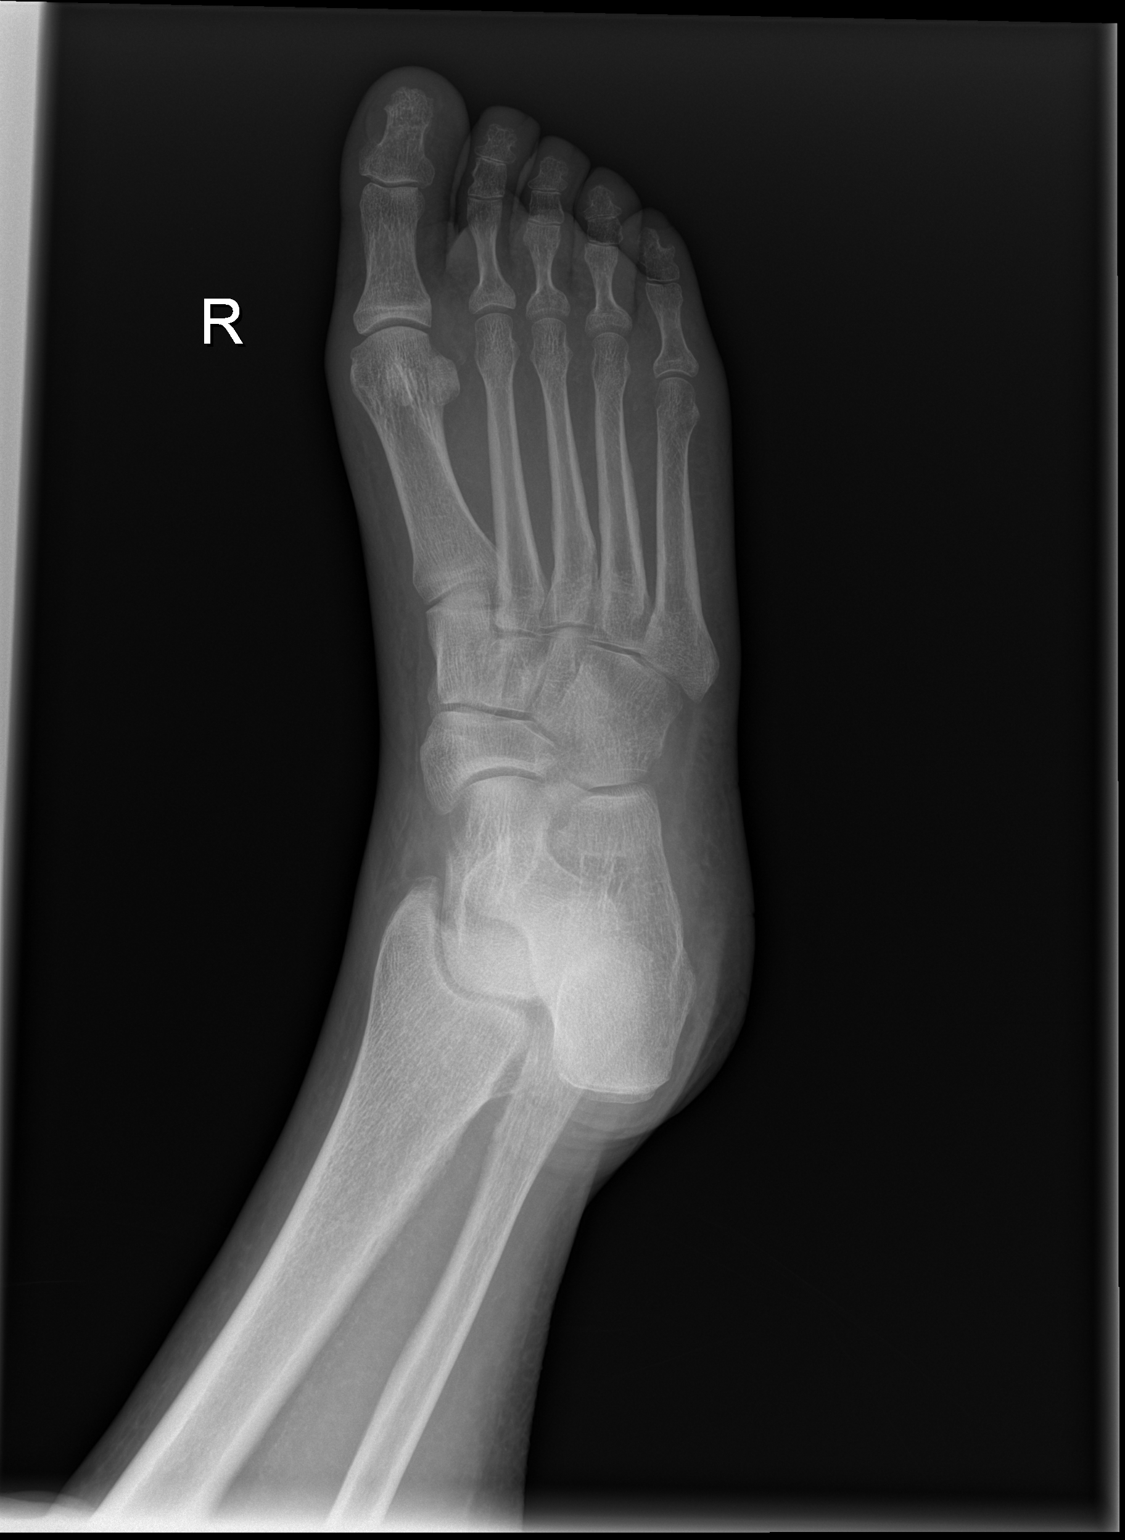

[3 of 3 positions shown; findings below may reference images not displayed]

FINDINGS: There is no evidence of fracture or dislocation. There is no
evidence of arthropathy or other focal bone abnormality. Subcutaneus
soft tissue edema of the forefoot/midfoot dorsum. Vascular
calcifications.
IMPRESSION: Negative.

## 2021-08-15 NOTE — Progress Notes (Signed)
[facility] PALLIATIVE CARE VISIT  PATIENT NAME: Mary Swanson DOB: [birth year] MRN: [id number]  PRIMARY CARE PROVIDER: Gerlene Fee, DO  RESPONSIBLE PARTY:  Acct ID - Guarantor Home Phone Work Phone Relationship Acct Type  [id number] - Joselyn Arrow [phone]  Self P/F     [place], [place], [place] [address]   Covid-19 Pre-screening Negative  PLAN OF CARE and INTERVENTION:  ADVANCE CARE PLANNING/GOALS OF CARE: Goal is for patient for remain at current facility [facility]). She is a Full code. PATIENT/CAREGIVER EDUCATION: Explained palliative care services, symptom management DISEASE STATUS: Joint visit made with LCSW, M. Lonon. Met with patient in the dining room at the facility. Patient initially asleep with her head down on the table. She was was slow to arouse with verbal and tactile stimulation. Once awake, she did remain engaged throughout visit. She is minimally engaging. She mainly nods to questions asked, but not all responses are factual. She was able to speak some clear words. She is fidgeting with a piece of paper throughout visit. Staff reports they are noticing a gradual increase in her confusion. She is often found eating things that are non-food items. She is ambulatory without the use of assistive devices. She is often found pacing throughout the facility and going into other resident's rooms. She requires assistance with all ADLs. She is incontinent of both bowel and bladder and wears adult briefs. She is resistant to personal care at times. Intermittent agitation is managed by as needed Ativan. Her intake is variable. She is a "brittle" diabetic and at times refuses blood sugar checks. Diabetes managed with insulin. Chart reviewed and spoke with facility staff regarding current patient condition. Family and facility are agreeable to future visits with palliative care.   HISTORY OF PRESENT ILLNESS:  This is 65 yo patient with a diagnosis of  Dementia. She has a history of hypertension, hyperlipidemia, anxiety, Type 2 diabetes and obesity. Palliative care team has been asked to follow patient for symptom management, goals of care and complex decision making.   CODE STATUS: Full code ADVANCED DIRECTIVES: Y MOST FORM: no PPS: 50%   PHYSICAL EXAM:   VITALS: Today's Vitals   07/19/21 1114  BP: (!) 149/79  Pulse: 76  Resp: 17  Temp: 97.7 F (36.5 C)  TempSrc: Temporal  SpO2: 97%  PainSc: 0-No pain    LUNGS: clear to auscultation  CARDIAC: Cor RRR EXTREMITIES: No edema SKIN:  Exposed skin is dry and intact   NEURO:  Continual confusion, minimally engaging, generalized weakness, ambulatory   (Duration of visit and documentation 30 minutes)   Daryl Eastern, RN BSN

## 2021-08-18 DIAGNOSIS — E119 Type 2 diabetes mellitus without complications: Secondary | ICD-10-CM | POA: Diagnosis not present

## 2021-11-30 ENCOUNTER — Non-Acute Institutional Stay: Payer: Medicare Other | Admitting: Family Medicine

## 2021-11-30 ENCOUNTER — Encounter: Payer: Self-pay | Admitting: Family Medicine

## 2021-11-30 VITALS — HR 84 | Temp 97.8°F | Resp 18

## 2021-11-30 DIAGNOSIS — F02818 Dementia in other diseases classified elsewhere, unspecified severity, with other behavioral disturbance: Secondary | ICD-10-CM

## 2021-11-30 DIAGNOSIS — Z794 Long term (current) use of insulin: Secondary | ICD-10-CM | POA: Diagnosis not present

## 2021-11-30 DIAGNOSIS — E1165 Type 2 diabetes mellitus with hyperglycemia: Secondary | ICD-10-CM | POA: Diagnosis not present

## 2021-11-30 DIAGNOSIS — M6281 Muscle weakness (generalized): Secondary | ICD-10-CM | POA: Diagnosis not present

## 2021-11-30 DIAGNOSIS — R2681 Unsteadiness on feet: Secondary | ICD-10-CM | POA: Diagnosis not present

## 2021-11-30 DIAGNOSIS — E785 Hyperlipidemia, unspecified: Secondary | ICD-10-CM | POA: Diagnosis not present

## 2021-11-30 DIAGNOSIS — I10 Essential (primary) hypertension: Secondary | ICD-10-CM | POA: Diagnosis not present

## 2021-11-30 DIAGNOSIS — R451 Restlessness and agitation: Secondary | ICD-10-CM | POA: Diagnosis not present

## 2021-11-30 DIAGNOSIS — G9341 Metabolic encephalopathy: Secondary | ICD-10-CM | POA: Diagnosis not present

## 2021-11-30 DIAGNOSIS — G309 Alzheimer's disease, unspecified: Secondary | ICD-10-CM | POA: Diagnosis not present

## 2021-11-30 NOTE — Progress Notes (Signed)
Therapist, nutritional Palliative Care Consult Note Telephone: [phone]  Fax: [phone]    Date of encounter: 11/30/21 1:30 PM PATIENT NAME: Mary Swanson [address] [place] [address]   [phone] (home)  DOB: 01/21/1956 MRN: [id number] PRIMARY CARE PROVIDER:    Lavonda Jumbo, DO,  [address] [place] [address] [phone]  REFERRING PROVIDER:   Lavonda Jumbo, DO [address] [place],  [place] [address] [phone]  RESPONSIBLE PARTY:    Contact Information     Name Relation Home Work Mobile   Cobb,Phyllis Neighbor   [phone]   Cobb,Phillip Clearence Cheek   [phone]      Paperwork on chart indicates that [name] and [name] are court appointed guardians of the patient.  I met face to face with patient in the AL facility. Palliative Care was asked to follow this patient by consultation request of  Autry-Lott, Randa Evens, DO to address advance care planning and complex medical decision making. This is a follow up visit.                                  ASSESSMENT, SYMPTOM MANAGEMENT AND PLAN / RECOMMENDATIONS:   Diabetes Mellitus type 2 with hyperglycemia-Last HGB A1c 11% in November 2022. Labile sugars.  Recommended ACHS blood sugars with sliding scale Lispro for before meal blood sugars, no nighttime coverage, continue Metformin 1000 mg daily.  Given recent days episodes of fasting hypoglycemia in 60s intermittently with 170-200 at HS, recommended either changing Lantus to daytime administration or reducing dose of Lantus and adding Metformin 1000 mg to dinner.  Facility NP monitoring and plans to follow up HGB A1c within the month.  Continue Glucose tabs/Glucerna prn for hypoglycemia and HS snacks. Continue statin and has ACE-I intolerance. Facility does not have diabetic diet capability. Alzheimer's Dementia-has Lorazepam 0.5 mg BID for agitation.  Encourage redirection prn when agitation starts.  Advance Care  Planning/Goals of Care: Court has appointed [name] and [name] as legal guardians for patient. Need to address code status and goals of care with them.  Per nurse at facility, pt's son has requested not to be called at all and has no contact with pt.  CODE STATUS: Full Code    Follow up Palliative Care Visit: Palliative care will continue to follow for complex medical decision making, advance care planning, and clarification of goals. Return 4 weeks or prn.  I spent 45 minutes providing this consultation. More than 50% of the time in this consultation was spent in counseling and care coordination.  This visit was coded based on medical decision making (MDM).  PPS: 60%  HOSPICE ELIGIBILITY/DIAGNOSIS: TBD  Chief Complaint:  Follow up for goals of care and advanced care planning as well as chronic disease management.  HISTORY OF PRESENT ILLNESS:  Mary Swanson is a 66 y.o. year old female with Alzheimer's Dementia with intermittent agitation and diabetes with labile blood sugars.  Per facility staff pt is known to eat off other's trays if they are left out, to eat non-food substances like paper and blood sugars can range from 60s to 400s. Facility is not able to provide carb consistent diet, last HGB A1c in November was 11%.  Pt is on Lantus 30 units QHS, Lispro SSI coverage for meals, Metformin 1000 mg daily.  Over the last 4 days, 2 of her fasting blood sugars have been in the 60s up to  the 130s, lunchtime has been 150s-160s with no insulin coverage and HS blood sugars 170-200.  In the last month she has had a blood sugar in the low 400 range.  Spoke with Lubertha Sayres, NP covering the facility and advised of recent hypoglycemia and made above recommendations if trends continue.  She states pt will have a repeat HGB A1c in the next month and she will keep Palliative updated.  Pt denies pain, is incontinent of bowel and bladder, has had no falls and ambulates independently, no issues with  constipation. She requires assistance with bathing, dressing and personal grooming.  History obtained from review of EMR, discussion with primary team, and interview with facility staff/caregiver and Ms. Engelstad.  I reviewed available labs, medications, imaging, studies and related documents from the EMR.  Records reviewed and summarized above.   ROS Unable to answer most due to advanced dementia General: NAD EYES: unable to answer ENMT: staff denies dysphagia, pt unable to answer Cardiovascular: denies chest pain, denies DOE Pulmonary: denies cough, denies increased SOB Abdomen: endorses good appetite, unable to answer about constipation, staff reports incontinence of bowel GU:  staff endorses incontinence of urine MSK:  no falls reported per staff-pt just gives non-sensical answer Skin: unable to answer Neurological: unable to answer Psych: Staff endorses overall positive mood with occasional agitation Heme/lymph/immuno: unable to answer  Physical Exam: Current and past weights:Last weight recorded 179 lbs 3.7 oz as of 06/17/21 Constitutional: NAD General: WN overweight female seen ambulatory on unit EYES: anicteric sclera, lids intact, no discharge  ENMT: intact hearing, oral mucous membranes moist CV: S1S2, RRR with SM, no LE edema Pulmonary: CTAB, no increased work of breathing, no cough, room air Abdomen: intake 100%, normo-active BS + 4 quadrants, soft and non tender, no ascites GU: deferred MSK: no sarcopenia, moves all extremities, ambulatory Skin: warm and dry, no rashes or wounds on visible skin Neuro:  no generalized weakness Psych: non-anxious affect, A and O to self, non-sensical word salad at times Hem/lymph/immuno: no widespread bruising   Thank you for the opportunity to participate in the care of Ms. Behringer.  The palliative care team will continue to follow. Please call our office at [phone] if we can be of additional assistance.   Marijo Conception, FNP  -C  COVID-19 PATIENT SCREENING TOOL Asked and negative response unless otherwise noted:   Have you had symptoms of covid, tested positive or been in contact with someone with symptoms/positive test in the past 5-10 days?  no

## 2021-12-01 ENCOUNTER — Other Ambulatory Visit: Payer: Self-pay

## 2021-12-08 DIAGNOSIS — D519 Vitamin B12 deficiency anemia, unspecified: Secondary | ICD-10-CM | POA: Diagnosis not present

## 2021-12-08 DIAGNOSIS — E785 Hyperlipidemia, unspecified: Secondary | ICD-10-CM | POA: Diagnosis not present

## 2021-12-08 DIAGNOSIS — M6281 Muscle weakness (generalized): Secondary | ICD-10-CM | POA: Diagnosis not present

## 2021-12-08 DIAGNOSIS — E559 Vitamin D deficiency, unspecified: Secondary | ICD-10-CM | POA: Diagnosis not present

## 2021-12-08 DIAGNOSIS — I10 Essential (primary) hypertension: Secondary | ICD-10-CM | POA: Diagnosis not present

## 2021-12-08 DIAGNOSIS — E1165 Type 2 diabetes mellitus with hyperglycemia: Secondary | ICD-10-CM | POA: Diagnosis not present

## 2021-12-12 DIAGNOSIS — B351 Tinea unguium: Secondary | ICD-10-CM | POA: Diagnosis not present

## 2021-12-12 DIAGNOSIS — E1159 Type 2 diabetes mellitus with other circulatory complications: Secondary | ICD-10-CM | POA: Diagnosis not present

## 2021-12-12 DIAGNOSIS — L603 Nail dystrophy: Secondary | ICD-10-CM | POA: Diagnosis not present

## 2021-12-21 DIAGNOSIS — G9341 Metabolic encephalopathy: Secondary | ICD-10-CM | POA: Diagnosis not present

## 2021-12-21 DIAGNOSIS — R451 Restlessness and agitation: Secondary | ICD-10-CM | POA: Diagnosis not present

## 2021-12-21 DIAGNOSIS — E785 Hyperlipidemia, unspecified: Secondary | ICD-10-CM | POA: Diagnosis not present

## 2021-12-21 DIAGNOSIS — I10 Essential (primary) hypertension: Secondary | ICD-10-CM | POA: Diagnosis not present

## 2021-12-21 DIAGNOSIS — M6281 Muscle weakness (generalized): Secondary | ICD-10-CM | POA: Diagnosis not present

## 2021-12-21 DIAGNOSIS — E1165 Type 2 diabetes mellitus with hyperglycemia: Secondary | ICD-10-CM | POA: Diagnosis not present

## 2021-12-21 DIAGNOSIS — R2681 Unsteadiness on feet: Secondary | ICD-10-CM | POA: Diagnosis not present

## 2021-12-28 DIAGNOSIS — Z794 Long term (current) use of insulin: Secondary | ICD-10-CM | POA: Diagnosis not present

## 2021-12-28 DIAGNOSIS — G9341 Metabolic encephalopathy: Secondary | ICD-10-CM | POA: Diagnosis not present

## 2021-12-28 DIAGNOSIS — I10 Essential (primary) hypertension: Secondary | ICD-10-CM | POA: Diagnosis not present

## 2021-12-28 DIAGNOSIS — R451 Restlessness and agitation: Secondary | ICD-10-CM | POA: Diagnosis not present

## 2021-12-28 DIAGNOSIS — R2681 Unsteadiness on feet: Secondary | ICD-10-CM | POA: Diagnosis not present

## 2021-12-28 DIAGNOSIS — E1165 Type 2 diabetes mellitus with hyperglycemia: Secondary | ICD-10-CM | POA: Diagnosis not present

## 2021-12-28 DIAGNOSIS — E785 Hyperlipidemia, unspecified: Secondary | ICD-10-CM | POA: Diagnosis not present

## 2021-12-28 DIAGNOSIS — M6281 Muscle weakness (generalized): Secondary | ICD-10-CM | POA: Diagnosis not present

## 2022-01-06 ENCOUNTER — Emergency Department (HOSPITAL_COMMUNITY): Payer: Medicare Other

## 2022-01-06 ENCOUNTER — Encounter (HOSPITAL_COMMUNITY): Payer: Self-pay | Admitting: Emergency Medicine

## 2022-01-06 ENCOUNTER — Inpatient Hospital Stay (HOSPITAL_COMMUNITY)
Admission: EM | Admit: 2022-01-06 | Discharge: 2022-01-12 | DRG: 871 | Disposition: A | Discharge status: Nursing Home (Includes ICF) | Payer: Medicare Other | Source: Skilled Nursing Facility | Attending: Family Medicine | Admitting: Family Medicine

## 2022-01-06 ENCOUNTER — Other Ambulatory Visit: Payer: Self-pay

## 2022-01-06 DIAGNOSIS — R7401 Elevation of levels of liver transaminase levels: Secondary | ICD-10-CM | POA: Diagnosis present

## 2022-01-06 DIAGNOSIS — Z9841 Cataract extraction status, right eye: Secondary | ICD-10-CM

## 2022-01-06 DIAGNOSIS — E785 Hyperlipidemia, unspecified: Secondary | ICD-10-CM | POA: Diagnosis not present

## 2022-01-06 DIAGNOSIS — Z9842 Cataract extraction status, left eye: Secondary | ICD-10-CM | POA: Diagnosis not present

## 2022-01-06 DIAGNOSIS — Z79899 Other long term (current) drug therapy: Secondary | ICD-10-CM

## 2022-01-06 DIAGNOSIS — R6520 Severe sepsis without septic shock: Secondary | ICD-10-CM | POA: Diagnosis present

## 2022-01-06 DIAGNOSIS — Z794 Long term (current) use of insulin: Secondary | ICD-10-CM | POA: Diagnosis not present

## 2022-01-06 DIAGNOSIS — R7989 Other specified abnormal findings of blood chemistry: Secondary | ICD-10-CM | POA: Diagnosis present

## 2022-01-06 DIAGNOSIS — Z7984 Long term (current) use of oral hypoglycemic drugs: Secondary | ICD-10-CM

## 2022-01-06 DIAGNOSIS — A400 Sepsis due to streptococcus, group A: Secondary | ICD-10-CM

## 2022-01-06 DIAGNOSIS — F02818 Dementia in other diseases classified elsewhere, unspecified severity, with other behavioral disturbance: Secondary | ICD-10-CM | POA: Diagnosis present

## 2022-01-06 DIAGNOSIS — R8281 Pyuria: Secondary | ICD-10-CM | POA: Diagnosis present

## 2022-01-06 DIAGNOSIS — K449 Diaphragmatic hernia without obstruction or gangrene: Secondary | ICD-10-CM | POA: Diagnosis not present

## 2022-01-06 DIAGNOSIS — Z91040 Latex allergy status: Secondary | ICD-10-CM

## 2022-01-06 DIAGNOSIS — N179 Acute kidney failure, unspecified: Secondary | ICD-10-CM | POA: Diagnosis present

## 2022-01-06 DIAGNOSIS — I10 Essential (primary) hypertension: Secondary | ICD-10-CM | POA: Diagnosis not present

## 2022-01-06 DIAGNOSIS — R2681 Unsteadiness on feet: Secondary | ICD-10-CM | POA: Diagnosis not present

## 2022-01-06 DIAGNOSIS — L02611 Cutaneous abscess of right foot: Secondary | ICD-10-CM | POA: Diagnosis not present

## 2022-01-06 DIAGNOSIS — G309 Alzheimer's disease, unspecified: Secondary | ICD-10-CM | POA: Diagnosis not present

## 2022-01-06 DIAGNOSIS — R739 Hyperglycemia, unspecified: Secondary | ICD-10-CM | POA: Diagnosis not present

## 2022-01-06 DIAGNOSIS — Z20822 Contact with and (suspected) exposure to covid-19: Secondary | ICD-10-CM | POA: Diagnosis present

## 2022-01-06 DIAGNOSIS — Z743 Need for continuous supervision: Secondary | ICD-10-CM | POA: Diagnosis not present

## 2022-01-06 DIAGNOSIS — R404 Transient alteration of awareness: Secondary | ICD-10-CM | POA: Diagnosis not present

## 2022-01-06 DIAGNOSIS — L089 Local infection of the skin and subcutaneous tissue, unspecified: Secondary | ICD-10-CM | POA: Diagnosis not present

## 2022-01-06 DIAGNOSIS — Z7401 Bed confinement status: Secondary | ICD-10-CM | POA: Diagnosis not present

## 2022-01-06 DIAGNOSIS — Z6828 Body mass index (BMI) 28.0-28.9, adult: Secondary | ICD-10-CM

## 2022-01-06 DIAGNOSIS — D539 Nutritional anemia, unspecified: Secondary | ICD-10-CM | POA: Diagnosis present

## 2022-01-06 DIAGNOSIS — Z881 Allergy status to other antibiotic agents status: Secondary | ICD-10-CM

## 2022-01-06 DIAGNOSIS — M7989 Other specified soft tissue disorders: Secondary | ICD-10-CM | POA: Diagnosis not present

## 2022-01-06 DIAGNOSIS — G319 Degenerative disease of nervous system, unspecified: Secondary | ICD-10-CM | POA: Diagnosis not present

## 2022-01-06 DIAGNOSIS — R410 Disorientation, unspecified: Secondary | ICD-10-CM | POA: Diagnosis not present

## 2022-01-06 DIAGNOSIS — R921 Mammographic calcification found on diagnostic imaging of breast: Secondary | ICD-10-CM | POA: Diagnosis not present

## 2022-01-06 DIAGNOSIS — E11628 Type 2 diabetes mellitus with other skin complications: Secondary | ICD-10-CM | POA: Diagnosis present

## 2022-01-06 DIAGNOSIS — Z7982 Long term (current) use of aspirin: Secondary | ICD-10-CM

## 2022-01-06 DIAGNOSIS — A419 Sepsis, unspecified organism: Secondary | ICD-10-CM

## 2022-01-06 DIAGNOSIS — R6889 Other general symptoms and signs: Secondary | ICD-10-CM | POA: Diagnosis not present

## 2022-01-06 DIAGNOSIS — E8809 Other disorders of plasma-protein metabolism, not elsewhere classified: Secondary | ICD-10-CM | POA: Diagnosis present

## 2022-01-06 DIAGNOSIS — Z888 Allergy status to other drugs, medicaments and biological substances status: Secondary | ICD-10-CM

## 2022-01-06 DIAGNOSIS — M19071 Primary osteoarthritis, right ankle and foot: Secondary | ICD-10-CM | POA: Diagnosis not present

## 2022-01-06 DIAGNOSIS — N19 Unspecified kidney failure: Secondary | ICD-10-CM

## 2022-01-06 DIAGNOSIS — M6281 Muscle weakness (generalized): Secondary | ICD-10-CM | POA: Diagnosis not present

## 2022-01-06 DIAGNOSIS — Z9882 Breast implant status: Secondary | ICD-10-CM | POA: Diagnosis not present

## 2022-01-06 DIAGNOSIS — L03115 Cellulitis of right lower limb: Secondary | ICD-10-CM | POA: Diagnosis not present

## 2022-01-06 DIAGNOSIS — E669 Obesity, unspecified: Secondary | ICD-10-CM | POA: Diagnosis present

## 2022-01-06 DIAGNOSIS — F02C18 Dementia in other diseases classified elsewhere, severe, with other behavioral disturbance: Secondary | ICD-10-CM

## 2022-01-06 DIAGNOSIS — E860 Dehydration: Secondary | ICD-10-CM | POA: Diagnosis present

## 2022-01-06 DIAGNOSIS — J9811 Atelectasis: Secondary | ICD-10-CM | POA: Diagnosis not present

## 2022-01-06 DIAGNOSIS — Z87440 Personal history of urinary (tract) infections: Secondary | ICD-10-CM

## 2022-01-06 DIAGNOSIS — R509 Fever, unspecified: Secondary | ICD-10-CM

## 2022-01-06 DIAGNOSIS — R600 Localized edema: Secondary | ICD-10-CM | POA: Diagnosis not present

## 2022-01-06 DIAGNOSIS — E1169 Type 2 diabetes mellitus with other specified complication: Secondary | ICD-10-CM | POA: Diagnosis not present

## 2022-01-06 DIAGNOSIS — E1165 Type 2 diabetes mellitus with hyperglycemia: Secondary | ICD-10-CM

## 2022-01-06 DIAGNOSIS — G9341 Metabolic encephalopathy: Secondary | ICD-10-CM | POA: Diagnosis present

## 2022-01-06 DIAGNOSIS — I700 Atherosclerosis of aorta: Secondary | ICD-10-CM | POA: Diagnosis not present

## 2022-01-06 DIAGNOSIS — K429 Umbilical hernia without obstruction or gangrene: Secondary | ICD-10-CM | POA: Diagnosis not present

## 2022-01-06 DIAGNOSIS — M659 Synovitis and tenosynovitis, unspecified: Secondary | ICD-10-CM | POA: Diagnosis not present

## 2022-01-06 DIAGNOSIS — R451 Restlessness and agitation: Secondary | ICD-10-CM | POA: Diagnosis not present

## 2022-01-06 DIAGNOSIS — R609 Edema, unspecified: Secondary | ICD-10-CM | POA: Diagnosis not present

## 2022-01-06 DIAGNOSIS — I499 Cardiac arrhythmia, unspecified: Secondary | ICD-10-CM | POA: Diagnosis not present

## 2022-01-06 DIAGNOSIS — J929 Pleural plaque without asbestos: Secondary | ICD-10-CM | POA: Diagnosis not present

## 2022-01-06 DIAGNOSIS — R7881 Bacteremia: Secondary | ICD-10-CM | POA: Diagnosis not present

## 2022-01-06 HISTORY — DX: Local infection of the skin and subcutaneous tissue, unspecified: L08.9

## 2022-01-06 HISTORY — DX: Sepsis due to Streptococcus, group A: A40.0

## 2022-01-06 HISTORY — DX: Elevation of levels of liver transaminase levels: R74.01

## 2022-01-06 LAB — COMPREHENSIVE METABOLIC PANEL
ALT: 122 U/L — ABNORMAL HIGH (ref 0–44)
AST: 134 U/L — ABNORMAL HIGH (ref 15–41)
Albumin: 2.5 g/dL — ABNORMAL LOW (ref 3.5–5.0)
Alkaline Phosphatase: 169 U/L — ABNORMAL HIGH (ref 38–126)
Anion gap: 8 (ref 5–15)
BUN: 27 mg/dL — ABNORMAL HIGH (ref 8–23)
CO2: 27 mmol/L (ref 22–32)
Calcium: 9.4 mg/dL (ref 8.9–10.3)
Chloride: 109 mmol/L (ref 98–111)
Creatinine, Ser: 1.21 mg/dL — ABNORMAL HIGH (ref 0.44–1.00)
GFR, Estimated: 49 mL/min — ABNORMAL LOW (ref >60)
Glucose, Bld: 293 mg/dL — ABNORMAL HIGH (ref 70–99)
Potassium: 4.2 mmol/L (ref 3.5–5.1)
Sodium: 144 mmol/L (ref 135–145)
Total Bilirubin: 0.8 mg/dL (ref 0.3–1.2)
Total Protein: 7.9 g/dL (ref 6.5–8.1)

## 2022-01-06 LAB — URINALYSIS, ROUTINE W REFLEX MICROSCOPIC
Bilirubin Urine: NEGATIVE
Glucose, UA: 50 mg/dL — AB
Ketones, ur: NEGATIVE mg/dL
Leukocytes,Ua: NEGATIVE
Nitrite: NEGATIVE
Protein, ur: 100 mg/dL — AB
Specific Gravity, Urine: 1.023 (ref 1.005–1.030)
pH: 5 (ref 5.0–8.0)

## 2022-01-06 LAB — CBC WITH DIFFERENTIAL/PLATELET
Abs Immature Granulocytes: 2.09 10*3/uL — ABNORMAL HIGH (ref 0.00–0.07)
Basophils Absolute: 0.1 10*3/uL (ref 0.0–0.1)
Basophils Relative: 0 %
Eosinophils Absolute: 0.1 10*3/uL (ref 0.0–0.5)
Eosinophils Relative: 0 %
HCT: 39.2 % (ref 36.0–46.0)
Hemoglobin: 12.1 g/dL (ref 12.0–15.0)
Immature Granulocytes: 7 %
Lymphocytes Relative: 3 %
Lymphs Abs: 0.9 10*3/uL (ref 0.7–4.0)
MCH: 30.9 pg (ref 26.0–34.0)
MCHC: 30.9 g/dL (ref 30.0–36.0)
MCV: 100.3 fL — ABNORMAL HIGH (ref 80.0–100.0)
Monocytes Absolute: 1.2 10*3/uL — ABNORMAL HIGH (ref 0.1–1.0)
Monocytes Relative: 4 %
Neutro Abs: 26.7 10*3/uL — ABNORMAL HIGH (ref 1.7–7.7)
Neutrophils Relative %: 86 %
Platelets: 310 10*3/uL (ref 150–400)
RBC: 3.91 MIL/uL (ref 3.87–5.11)
RDW: 12.3 % (ref 11.5–15.5)
WBC: 31.4 10*3/uL — ABNORMAL HIGH (ref 4.0–10.5)
nRBC: 0 % (ref 0.0–0.2)

## 2022-01-06 LAB — APTT: aPTT: 30 seconds (ref 24–36)

## 2022-01-06 LAB — LACTIC ACID, PLASMA: Lactic Acid, Venous: 1.9 mmol/L (ref 0.5–1.9)

## 2022-01-06 LAB — RESP PANEL BY RT-PCR (FLU A&B, COVID) ARPGX2
Influenza A by PCR: NEGATIVE
Influenza B by PCR: NEGATIVE
SARS Coronavirus 2 by RT PCR: NEGATIVE

## 2022-01-06 LAB — PROTIME-INR
INR: 1.2 (ref 0.8–1.2)
Prothrombin Time: 14.7 seconds (ref 11.4–15.2)

## 2022-01-06 LAB — CBG MONITORING, ED: Glucose-Capillary: 179 mg/dL — ABNORMAL HIGH (ref 70–99)

## 2022-01-06 IMAGING — CT CT HEAD W/O CM
3 series · 15 of 47 positions shown, 18 images · non-contrast
Comparison: CT head [DATE]

CLINICAL DATA: Delirium



[Series 2: head wo · axial · 0.38mm/px · z∈[+1251,+1376]mm · 9 of 31 slices shown, 12 images]
[im 3/31  brain]
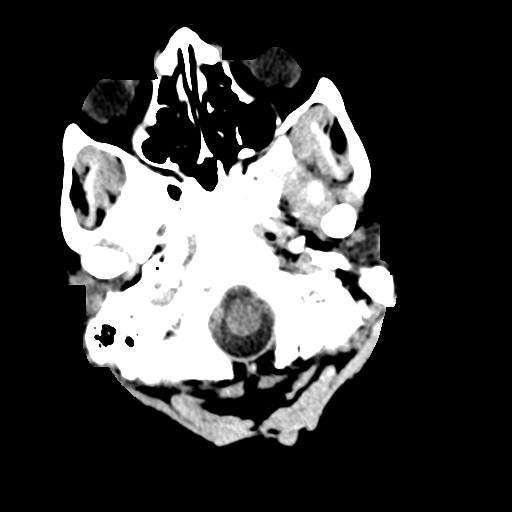
[im 3/31  bone]
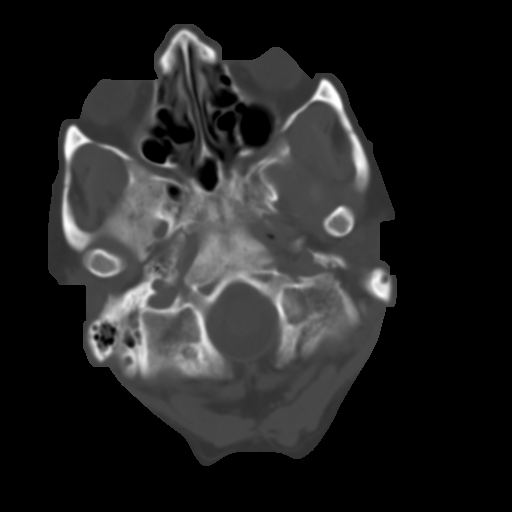
[im 6/31  brain]
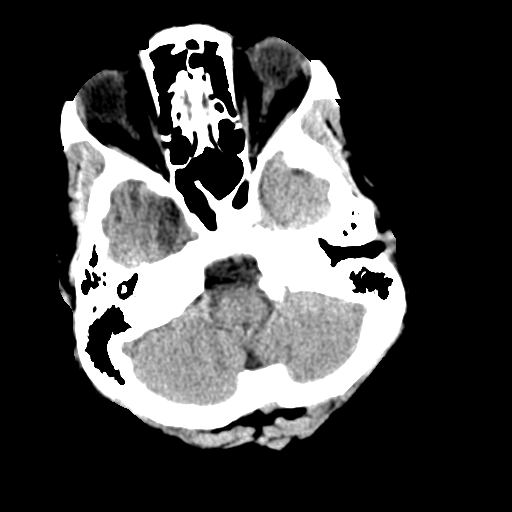
[im 9/31  brain]
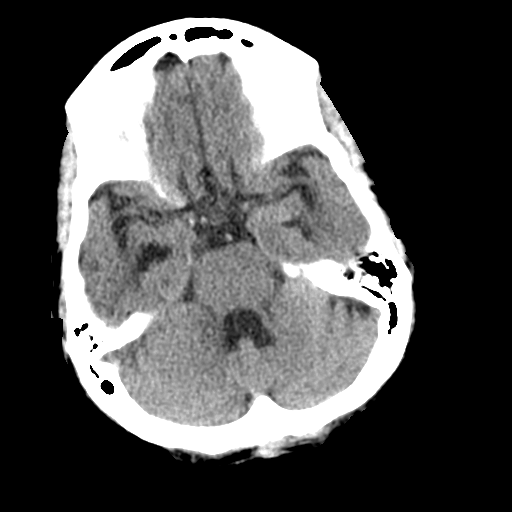
[im 12/31  brain]
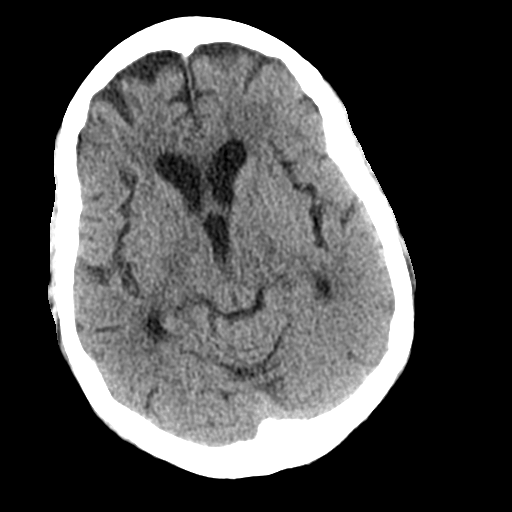
[im 16/31  brain]
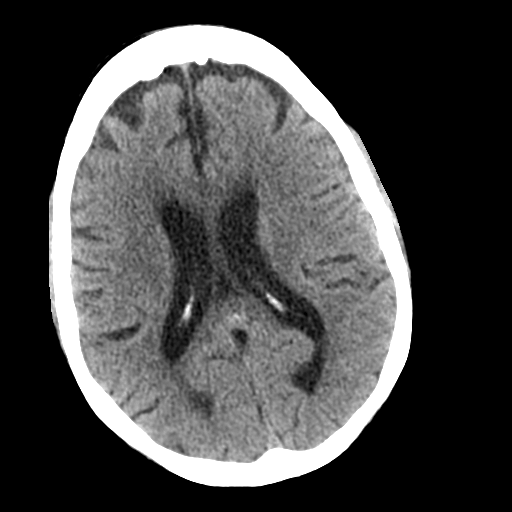
[im 16/31  bone]
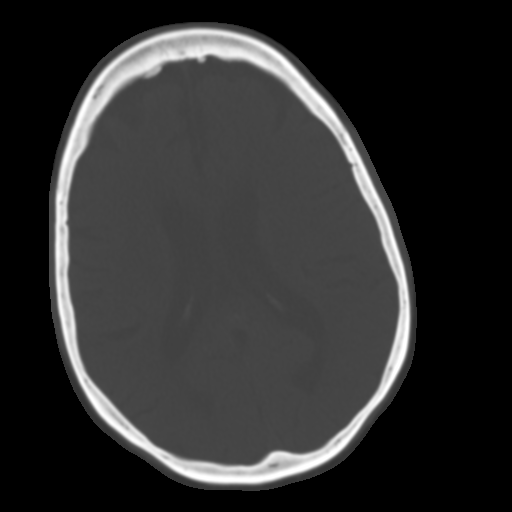
[im 19/31  brain]
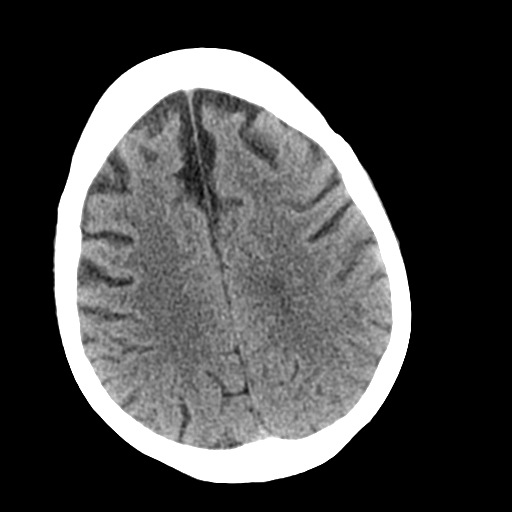
[im 22/31  brain]
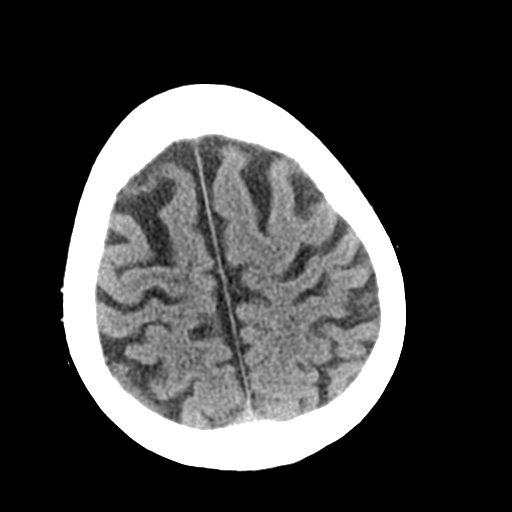
[im 25/31  brain]
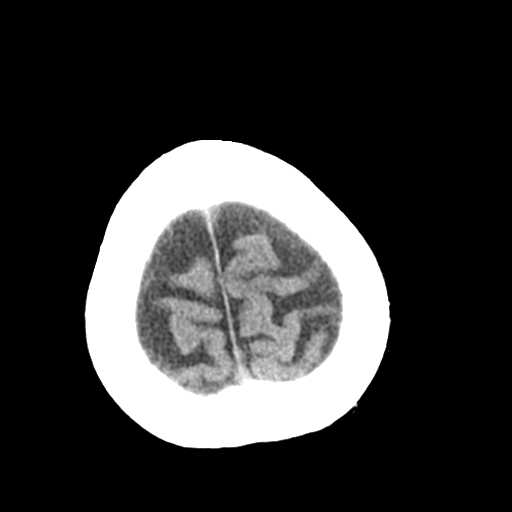
[im 28/31  brain]
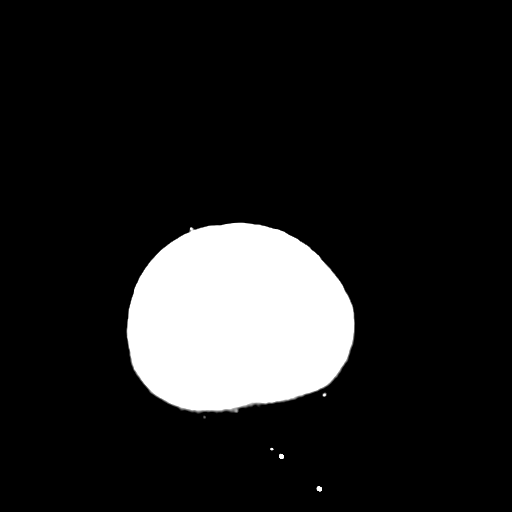
[im 28/31  bone]
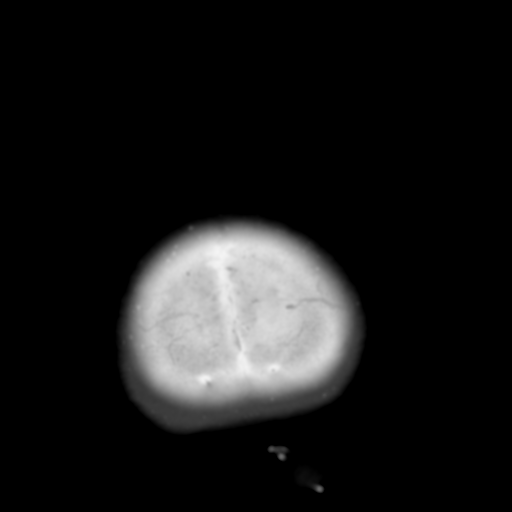

[Series 5: coronal soft tissue · coronal · 0.29mm/px · 3 of 64 slices shown]
[im 22/64  brain]
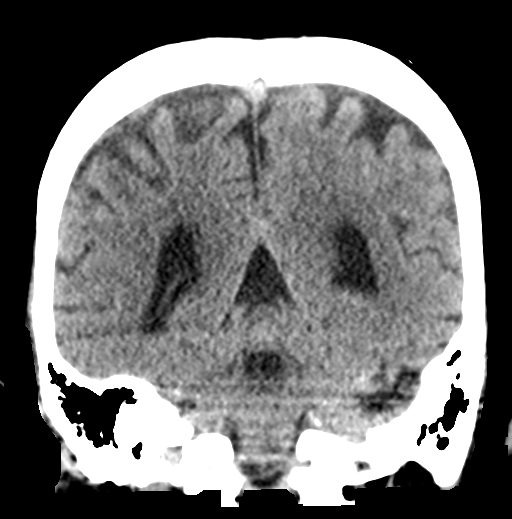
[im 29/64  brain]
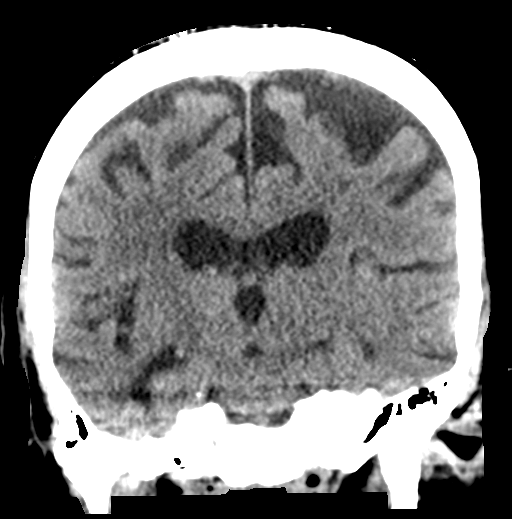
[im 36/64  brain]
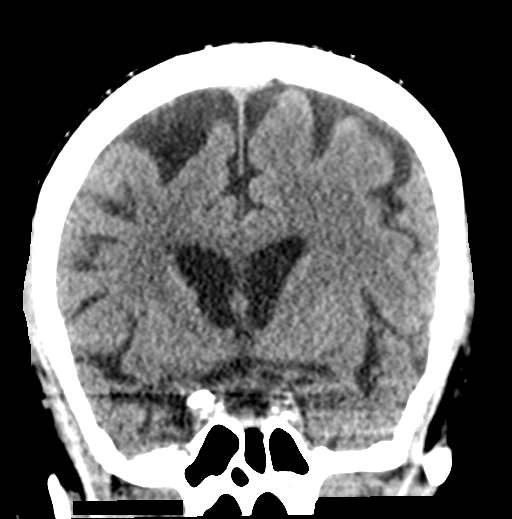

[Series 6: sagittal soft tissue · sagittal · 0.30mm/px · 3 of 51 slices shown]
[im 17/51  brain]
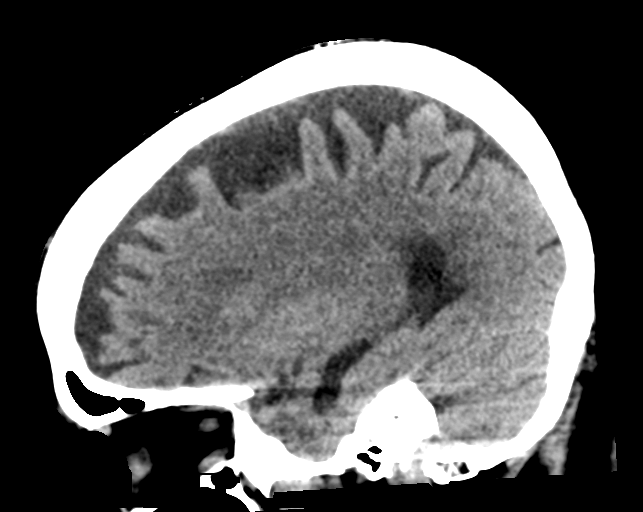
[im 26/51  brain]
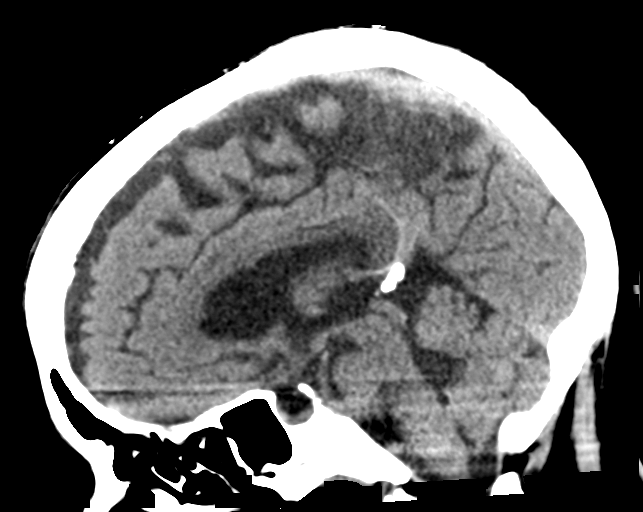
[im 34/51  brain]
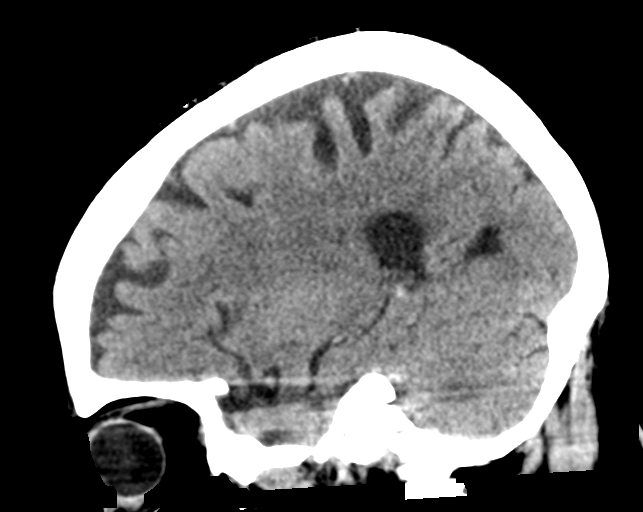

[15 of 47 positions shown; findings below may reference images not displayed]

BRAIN:
BRAIN
Cerebral ventricle sizes are concordant with the degree of cerebral
volume loss. Mild patchy and confluent areas of decreased
attenuation are noted throughout the deep and periventricular white
matter of the cerebral hemispheres bilaterally, compatible with
chronic microvascular ischemic disease.

No evidence of large-territorial acute infarction. No parenchymal
hemorrhage. No mass lesion. No extra-axial collection.

No mass effect or midline shift. No hydrocephalus. Basilar cisterns
are patent.

Vascular: No hyperdense vessel. Atherosclerotic calcifications are
present within the cavernous internal carotid arteries.

Skull: No acute fracture or focal lesion.

Sinuses/Orbits: Frothy mucosal thickening within the bilateral
sphenoid sinuses and right ethmoid sinus paranasal sinuses and
mastoid air cells are clear. Bilateral lens replacement. Otherwise
the orbits are unremarkable.

Other: None.
IMPRESSION: No acute intracranial abnormality.

## 2022-01-06 IMAGING — CT CT CHEST-ABD-PELV W/ CM
2 of 5 series · 13 of 36 positions shown, 15 images · IV contrast (agent unspecified)
Comparison: None.

CLINICAL DATA: Altered mental status.  Sepsis.

EXAM:
CT CHEST, ABDOMEN, AND PELVIS WITH CONTRAST
TECHNIQUE: Multidetector CT imaging of the chest, abdomen and pelvis was
performed following the standard protocol during bolus
administration of intravenous contrast.

[Series 2: cap with · axial · 0.76mm/px · z∈[+586,+1082]mm · 10 of 121 slices shown, 12 images]
[im 11/121  mediastinal]
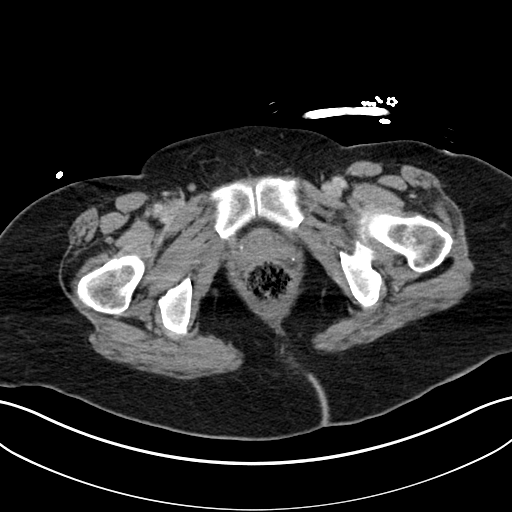
[im 11/121  bone]
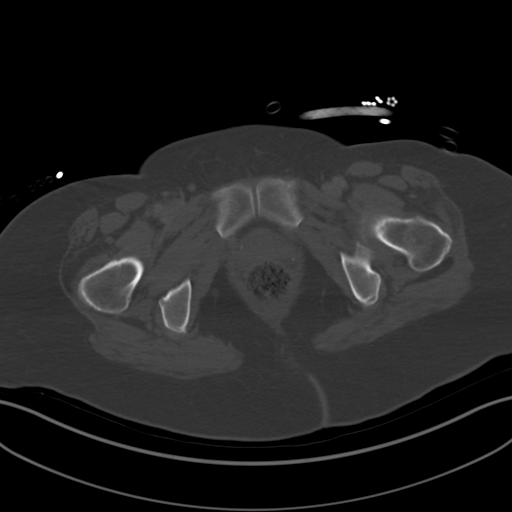
[im 22/121  mediastinal]
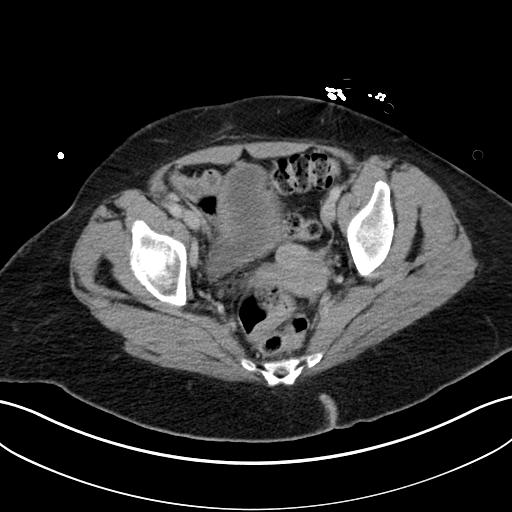
[im 33/121  mediastinal]
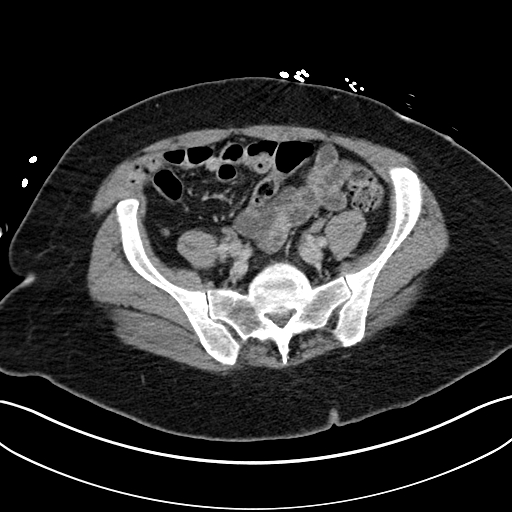
[im 44/121  mediastinal]
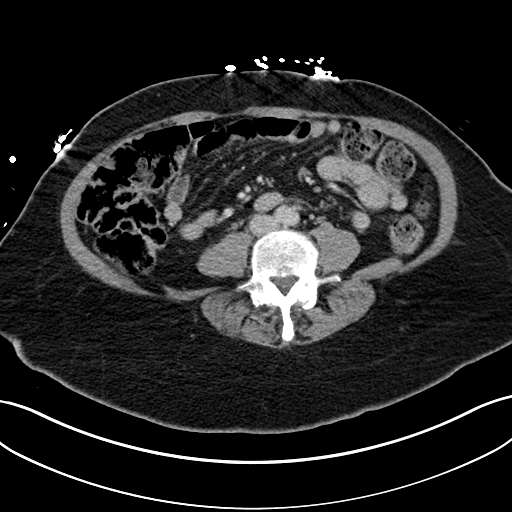
[im 55/121  mediastinal]
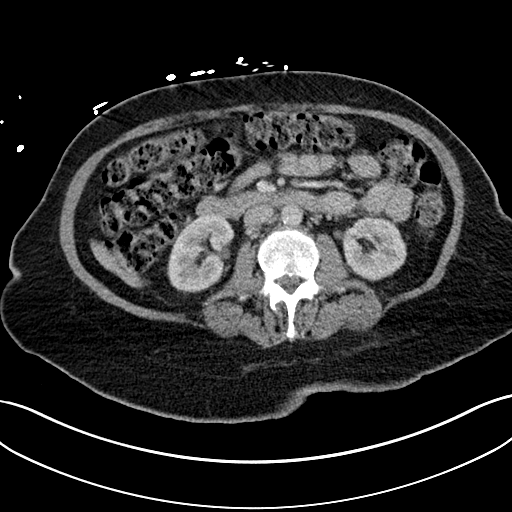
[im 66/121  mediastinal]
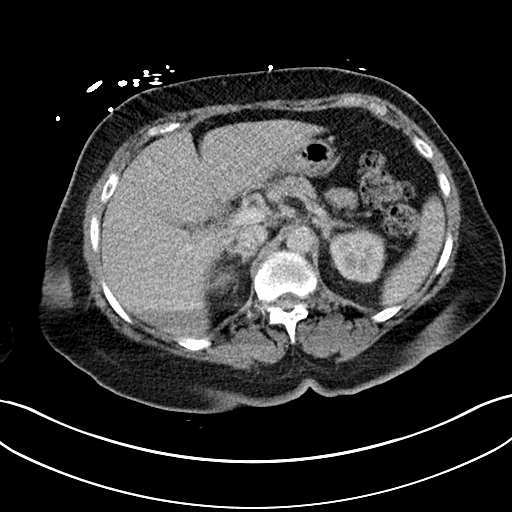
[im 77/121  mediastinal]
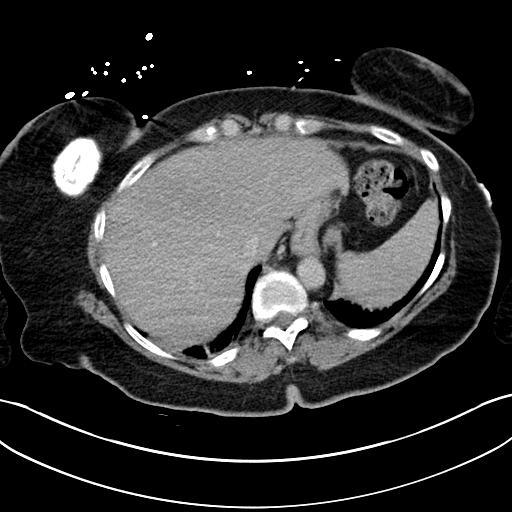
[im 88/121  mediastinal]
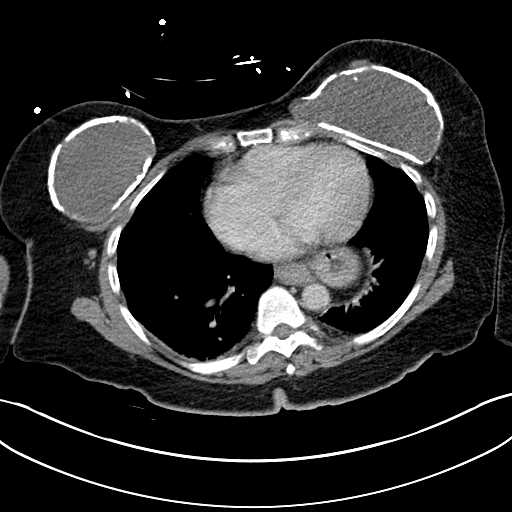
[im 99/121  mediastinal]
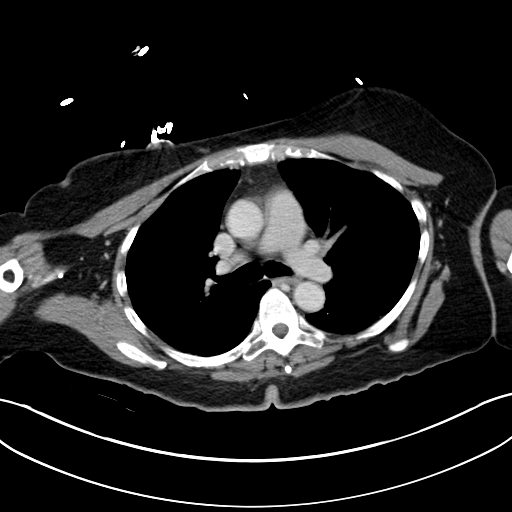
[im 99/121  bone]
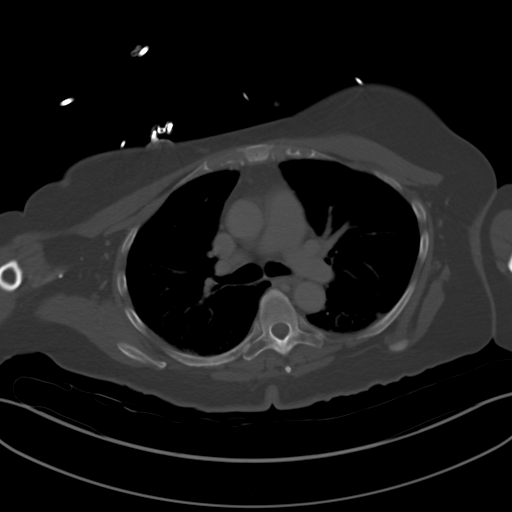
[im 110/121  mediastinal]
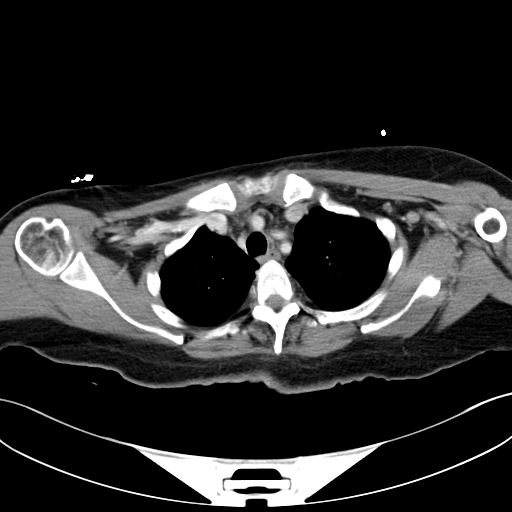

[Series 5: coronals · coronal · 0.71mm/px · 3 of 137 slices shown]
[im 28/137  mediastinal]
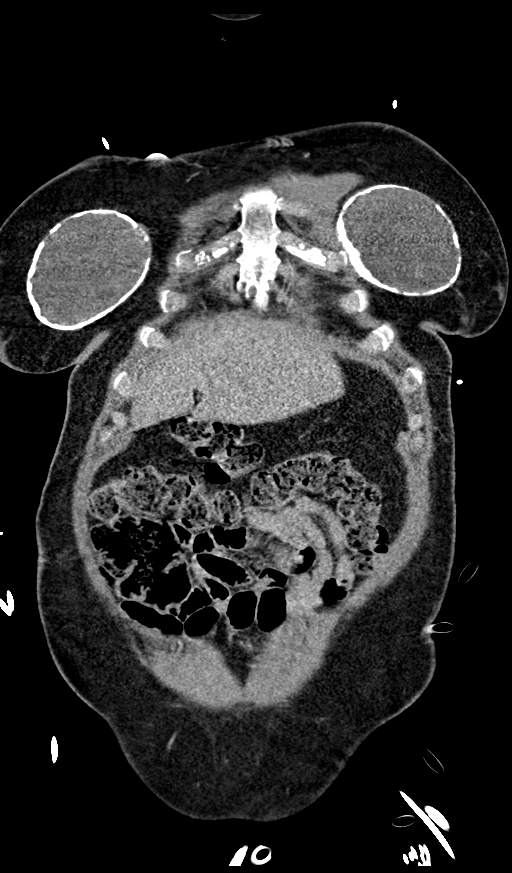
[im 55/137  mediastinal]
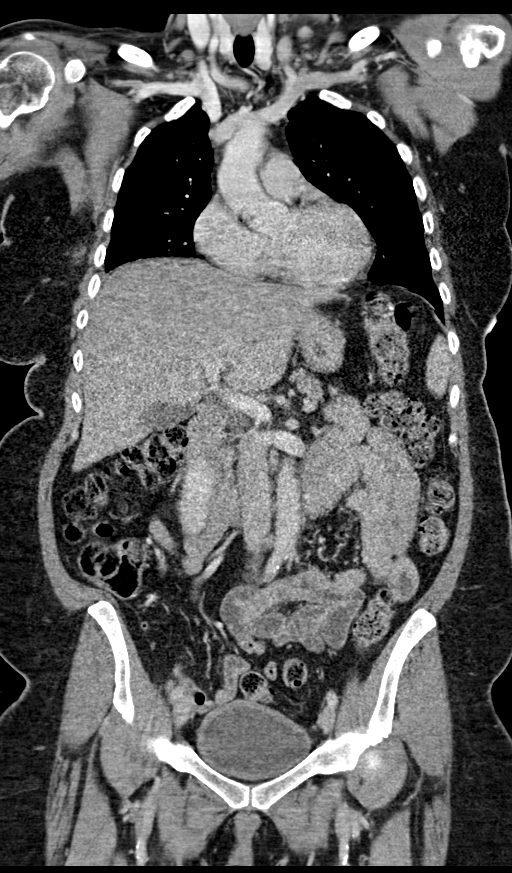
[im 82/137  mediastinal]
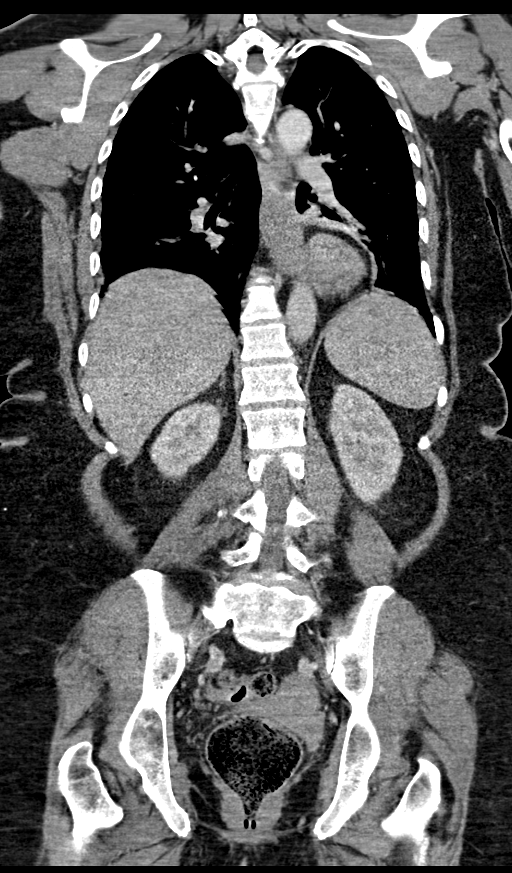

[13 of 36 positions shown; findings below may reference images not displayed]

RADIATION DOSE REDUCTION: This exam was performed according to the
departmental dose-optimization program which includes automated
exposure control, adjustment of the mA and/or kV according to
patient size and/or use of iterative reconstruction technique.

CONTRAST:  80mL OMNIPAQUE IOHEXOL 300 MG/ML  SOLN
FINDINGS: CT CHEST FINDINGS

Cardiovascular: Normal heart size. No significant pericardial
effusion. The thoracic aorta is normal in caliber. Mild
atherosclerotic plaque of the thoracic aorta. No coronary artery
calcifications.

Mediastinum/Nodes: No enlarged mediastinal, hilar, or axillary lymph
nodes. Thyroid gland, trachea, and esophagus demonstrate no
significant findings. Small to moderate volume hiatal hernia. Right
paratracheal/precarinal conglomerative lymph node measuring up to
1.3 cm ([DATE]).

Lungs/Pleura: Bilateral lower lobe subsegmental atelectasis. Mild
biapical interlobular septal wall thickening. Query scattered
tree-in-bud nodularity along the bilateral apices. No focal
consolidation. No pulmonary nodule. No pulmonary mass. No pleural
effusion. No pneumothorax.

Musculoskeletal:

No chest wall abnormality. Bilateral peripherally calcified breast
implants.

No suspicious lytic or blastic osseous lesions. No acute displaced
fracture.

CT ABDOMEN PELVIS FINDINGS

Hepatobiliary: No focal liver abnormality. No gallstones,
gallbladder wall thickening, or pericholecystic fluid. No biliary
dilatation.

Pancreas: No focal lesion. Normal pancreatic contour. No surrounding
inflammatory changes. No main pancreatic ductal dilatation.

Spleen: Normal in size without focal abnormality.

Adrenals/Urinary Tract:

No adrenal nodule bilaterally.

Bilateral kidneys enhance symmetrically. Subcentimeter hypodensities
too small to characterize.

No hydronephrosis. No hydroureter.

The urinary bladder is unremarkable.

On delayed imaging, there is no urothelial wall thickening and there
are no filling defects in the opacified portions of the bilateral
collecting systems or ureters.

Stomach/Bowel: Stomach is within normal limits. No evidence of bowel
wall thickening or dilatation. Stool throughout the colon. Appendix
appears normal.

Vascular/Lymphatic: No abdominal aorta or iliac aneurysm. Mild
atherosclerotic plaque of the aorta and its branches. Prominent but
nonenlarged right inguinal lymph node measuring up to 1.4 cm. No
abdominal, pelvic, or inguinal lymphadenopathy.

Reproductive: Uterus and bilateral adnexa are unremarkable.

Other: No intraperitoneal free fluid. No intraperitoneal free gas.
No organized fluid collection.

Musculoskeletal:

Small fat containing umbilical hernia.

No suspicious lytic or blastic osseous lesions. No acute displaced
fracture.
IMPRESSION: 1. Vague bilateral lung apices changes may represent developing
atypical infection versus mild pulmonary edema.
2. Nonspecific right paratracheal/precarinal enlarged lymph node.
3. Small to moderate volume hiatal hernia.
4. Stool throughout the colon.  Correlate for constipation.
5. No acute intra-abdominal or intrapelvic abnormality.
6.  Aortic Atherosclerosis ([KR]-[KR]).

## 2022-01-06 IMAGING — DX DG CHEST 1V PORT
1 series · 1 of 1 positions shown · non-contrast
Comparison: [DATE].

CLINICAL DATA: A 65-year-old fee female presents for evaluation of
potential sepsis.

EXAM:
PORTABLE CHEST 1 VIEW

[chest ap]
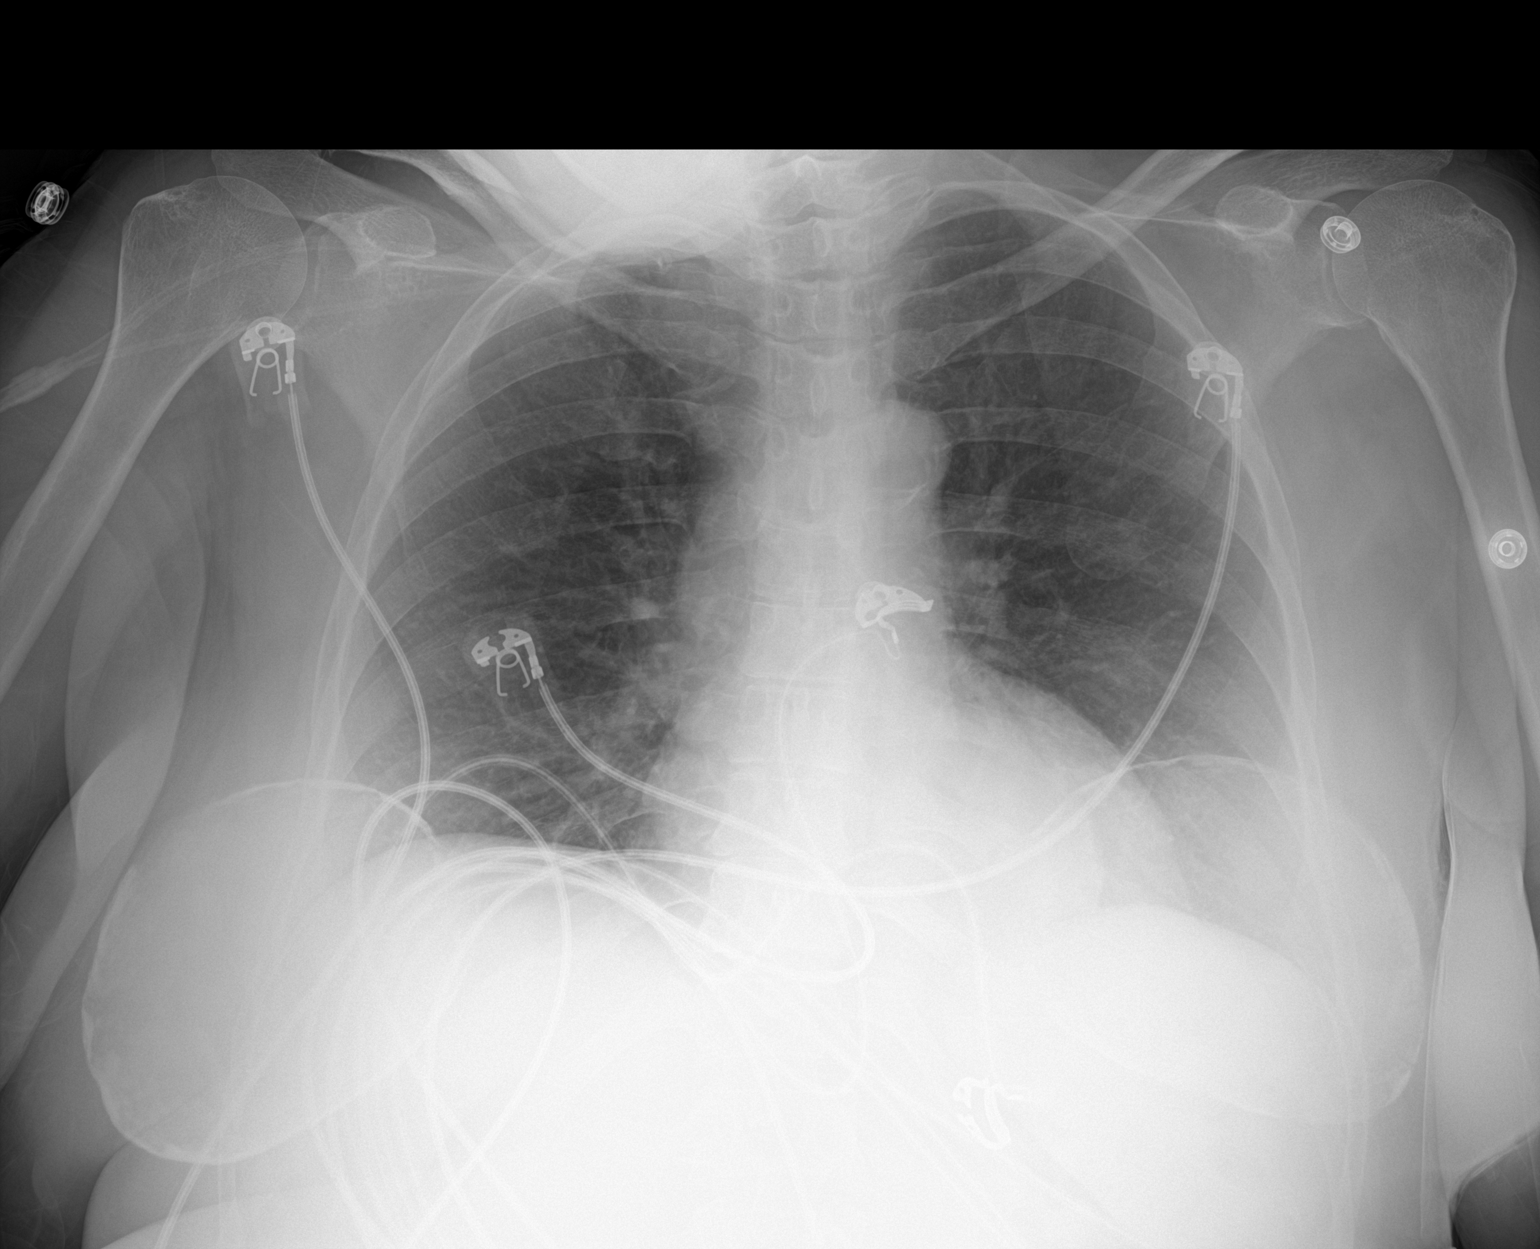

[1 of 1 positions shown; findings below may reference images not displayed]

FINDINGS: EKG leads project over the chest.

Cardiomediastinal contours and hilar structures are stable.

No lobar consolidation.  No effusion or visible pneumothorax.

Partially calcified breast implants project over the chest as
before.

On limited assessment there is no acute skeletal process.
IMPRESSION: No acute cardiopulmonary process.

## 2022-01-06 MED ORDER — VANCOMYCIN HCL IN DEXTROSE 1-5 GM/200ML-% IV SOLN: 1000.0000 mg | Freq: Once | INTRAVENOUS | Status: DC

## 2022-01-06 MED ORDER — SODIUM CHLORIDE 0.9 % IV SOLN
2.0000 g | INTRAVENOUS | Status: DC
  Administered 2022-01-06 – 2022-01-08 (×3): 2 g via INTRAVENOUS
  Filled 2022-01-06 (×3): qty 20

## 2022-01-06 MED ORDER — LACTATED RINGERS IV BOLUS (SEPSIS)
1000.0000 mL | Freq: Once | INTRAVENOUS | Status: AC
  Administered 2022-01-06: 1000 mL via INTRAVENOUS

## 2022-01-06 MED ORDER — LACTATED RINGERS IV BOLUS (SEPSIS): 500.0000 mL | Freq: Once | INTRAVENOUS | Status: DC

## 2022-01-06 MED ORDER — ACETAMINOPHEN 650 MG RE SUPP: 650.0000 mg | Freq: Four times a day (QID) | RECTAL | Status: DC | PRN

## 2022-01-06 MED ORDER — SODIUM CHLORIDE 0.9 % IV SOLN
2.0000 g | Freq: Once | INTRAVENOUS | Status: AC
  Administered 2022-01-06: 2 g via INTRAVENOUS
  Filled 2022-01-06: qty 2

## 2022-01-06 MED ORDER — INSULIN GLARGINE-YFGN 100 UNIT/ML ~~LOC~~ SOLN
15.0000 [IU] | Freq: Every day | SUBCUTANEOUS | Status: DC
  Administered 2022-01-06: 15 [IU] via SUBCUTANEOUS
  Filled 2022-01-06: qty 0.15

## 2022-01-06 MED ORDER — INSULIN ASPART 100 UNIT/ML IJ SOLN
0.0000 [IU] | Freq: Four times a day (QID) | INTRAMUSCULAR | Status: DC
  Administered 2022-01-07: 1 [IU] via SUBCUTANEOUS
  Filled 2022-01-06: qty 0.06

## 2022-01-06 MED ORDER — ACETAMINOPHEN 650 MG RE SUPP
650.0000 mg | Freq: Once | RECTAL | Status: AC
  Administered 2022-01-06: 650 mg via RECTAL
  Filled 2022-01-06: qty 1

## 2022-01-06 MED ORDER — LACTATED RINGERS IV SOLN: INTRAVENOUS | Status: DC

## 2022-01-06 MED ORDER — ACETAMINOPHEN 325 MG PO TABS
650.0000 mg | ORAL_TABLET | Freq: Four times a day (QID) | ORAL | Status: DC | PRN
  Administered 2022-01-08: 650 mg via ORAL
  Filled 2022-01-06: qty 2

## 2022-01-06 MED ORDER — VANCOMYCIN HCL IN DEXTROSE 1-5 GM/200ML-% IV SOLN: 1000.0000 mg | INTRAVENOUS | Status: DC

## 2022-01-06 MED ORDER — IOHEXOL 300 MG/ML  SOLN
100.0000 mL | Freq: Once | INTRAMUSCULAR | Status: AC | PRN
  Administered 2022-01-06: 80 mL via INTRAVENOUS

## 2022-01-06 MED ORDER — METRONIDAZOLE 500 MG/100ML IV SOLN
500.0000 mg | Freq: Two times a day (BID) | INTRAVENOUS | Status: DC
  Administered 2022-01-07 – 2022-01-08 (×4): 500 mg via INTRAVENOUS
  Filled 2022-01-06 (×4): qty 100

## 2022-01-06 MED ORDER — VANCOMYCIN HCL 2000 MG/400ML IV SOLN
2000.0000 mg | Freq: Once | INTRAVENOUS | Status: AC
  Administered 2022-01-06: 2000 mg via INTRAVENOUS
  Filled 2022-01-06: qty 400

## 2022-01-06 MED ORDER — METRONIDAZOLE 500 MG/100ML IV SOLN
500.0000 mg | Freq: Once | INTRAVENOUS | Status: AC
  Administered 2022-01-06: 500 mg via INTRAVENOUS
  Filled 2022-01-06: qty 100

## 2022-01-06 NOTE — ED Notes (Addendum)
Unable to perform swallow screen at this time, patient not alert enough.  MD made aware.

## 2022-01-06 NOTE — ED Notes (Signed)
PureWick placed on patient. 

## 2022-01-06 NOTE — ED Triage Notes (Signed)
BIBA Per EMS: Pt coming from [facility] w/ c/o Altered mental status  Usually A&Ox4  Unknown time- AMS  LKW unknown  Meets EMS sepsis criteria  Right foot swollen, hot to touch  Capnography 40  130/80 130 HR

## 2022-01-06 NOTE — Progress Notes (Signed)
Pharmacy Antibiotic Note  Mary Swanson is a 66 y.o. female admitted on 01/06/2022 with diabetic foot wound. Pharmacy has been consulted for vancomycin dosing. AKI noted on admission (baseline SCr ~0.7).  Plan: Vancomycin 2000 mg IV now, then 1000 mg IV q24 hr (est AUC 459 based on SCr 1.21; Vd 0.5) Measure vancomycin AUC at steady state as indicated SCr daily while on vanc - if SCr continues to rise would consider alternative MRSA agent (daptomycin, linezolid) CTX/Flagyl per MD; dosing appropriate   Height: 5\' 6"  (167.6 cm) Weight: 108.9 kg (240 lb) IBW/kg (Calculated) : 59.3  Temp (24hrs), Avg:102.3 F (39.1 C), Min:100.5 F (38.1 C), Max:103.8 F (39.9 C)  Recent Labs  Lab 01/06/22 1341 01/06/22 1516  WBC 31.4*  --   CREATININE  --  1.21*  LATICACIDVEN 1.9  --     Estimated Creatinine Clearance: 56.3 mL/min (A) (by C-G formula based on SCr of 1.21 mg/dL (H)).    Allergies  Allergen Reactions   Erythromycin Diarrhea   Latex Hives   Lisinopril Cough     Thank you for allowing pharmacy to be a part of this patients care.  Shevette Bess A 01/06/2022 9:10 PM

## 2022-01-06 NOTE — ED Provider Notes (Signed)
[facility] DEPT Provider Note  CSN: [id number] Arrival date & time: 01/06/22 1258  Chief Complaint(s) Altered Mental Status and Code Sepsis  HPI Mary Swanson is a 66 y.o. female with PMH T2DM, HTN, dementia who presents the emergency department for evaluation of altered mental status and sepsis.  Patient arrives from [employer] with minimal history, nonresponsive to commands but protecting her airway, tachycardic in the 130s with initial temperature of 103.8, blood pressure 176/96.  Normal mental status is alert and oriented but patient will respond to noxious stimuli only here.  Right lower extremity hot to the touch.  Additional history unable to be obtained as the patient is currently altered.  Altered mental status, sepsis   Altered Mental Status  Past Medical History Past Medical History:  Diagnosis Date   Acute lower UTI 9/73/5329   Acute metabolic encephalopathy 08/20/2682   Diabetes mellitus ([place]) 12/28/2019   Diabetes mellitus without complication ([place])    Hypertension    Memory loss of unknown cause 12/03/2019   Patient Active Problem List   Diagnosis Date Noted   Hypernatremia 06/16/2021   Alzheimer's dementia with behavioral disturbance ([place])    Altered mental status 06/15/2021   Hospital discharge follow-up 01/02/2020   Abnormal CXR    Essential hypertension    Hyperosmolar hyperglycemic state (HHS) ([place]) 12/27/2019   AKI (acute kidney injury) ([place]) 12/27/2019   Dehydration 12/27/2019   Elevated blood pressure reading 12/03/2019   Hyperglycemia 12/03/2019   ACE-inhibitor cough 03/14/2019   Diabetes mellitus without complication (HCC)    Heart murmur, systolic 41/96/2229   Hyperlipidemia 01/24/2007   OBESITY, NOS 01/24/2007   Anemia 01/24/2007   History of depression 01/24/2007   RHINITIS, ALLERGIC 01/24/2007   Type 2 diabetes mellitus with hyperglycemia ([place]) 01/24/2007   Home Medication(s) Prior to Admission medications    Medication Sig Start Date End Date Taking? Authorizing Provider  acetaminophen (TYLENOL) 325 MG tablet Take 2 tablets (650 mg total) by mouth every 6 (six) hours as needed for mild pain (or Fever >/= 101). 06/18/21   Eugenie Filler, MD  AgaMatrix Ultra-Thin Lancets MISC Check blood glucose 3 times daily before meals and as needed 06/25/19   Mack Hook, MD  amLODipine (NORVASC) 10 MG tablet Take 1 tablet (10 mg total) by mouth daily. 01/02/20   Eugenie Filler, MD  aspirin 81 MG chewable tablet Chew 81 mg by mouth daily.     [provider]  Blood Glucose Monitoring Suppl (AGAMATRIX PRESTO) w/Device KIT Check blood glucose three times daily before meals and as needed 06/25/19   Mack Hook, MD  glucose 4 GM chewable tablet Chew 1 tablet by mouth as needed for low blood sugar.    [provider]  glucose blood (AGAMATRIX PRESTO TEST) test strip Check blood glucose 3 times daily before meals and as needed 06/25/19   Mack Hook, MD  insulin glargine (LANTUS SOLOSTAR) 100 UNIT/ML Solostar Pen Inject 18 Units into the skin daily. Patient taking differently: Inject 30 Units into the skin at bedtime. 06/18/21   Eugenie Filler, MD  insulin lispro (HUMALOG) 100 UNIT/ML KwikPen Inject 2-12 Units into the skin See admin instructions. Inject 2-12 units into the skin three times a day, per sliding scale: BGL 151-200 = 2 units; 201-250 = 3 units; 251-300 = 4 units; 301-350 = 6 units; 351-400 = 10 units; >401 = 12 units and CALL NP    [provider]  Insulin Pen Needle (BD PEN  NEEDLE NANO U/F) 32G X 4 MM MISC 1 Stick by Does not apply route every morning. 06/05/17   Eloise Levels, MD  LORazepam (ATIVAN) 0.5 MG tablet Take 1 tablet (0.5 mg total) by mouth in the morning and at bedtime. 06/18/21   Eugenie Filler, MD  metFORMIN (GLUCOPHAGE-XR) 500 MG 24 hr tablet Take 1,000 mg by mouth daily with breakfast.    [provider]  Multiple Vitamin  (MULTIVITAMIN WITH MINERALS) TABS tablet Take 1 tablet by mouth daily. 01/02/20   Eugenie Filler, MD  QUEtiapine (SEROQUEL) 25 MG tablet Take 1 tablet (25 mg total) by mouth 2 (two) times daily as needed (agitation). 06/18/21   Eugenie Filler, MD  rosuvastatin (CRESTOR) 20 MG tablet Take 20 mg by mouth at bedtime.    [provider]  thiamine 100 MG tablet Take 1 tablet (100 mg total) by mouth daily. 01/01/20   Eugenie Filler, MD                                                                                                                                    Past Surgical History Past Surgical History:  Procedure Laterality Date   CATARACT EXTRACTION, BILATERAL Bilateral    Family History Family History  Problem Relation Age of Onset   Diabetes Neg Hx     Social History Social History   Tobacco Use   Smoking status: Never   Smokeless tobacco: Never  Vaping Use   Vaping Use: Never used  Substance Use Topics   Alcohol use: Not Currently   Drug use: Never   Allergies Erythromycin, Latex, and Lisinopril  Review of Systems Review of Systems  Unable to perform ROS: Mental status change   Physical Exam Vital Signs  I have reviewed the triage vital signs BP (!) 176/96    Pulse (!) 124    Temp (!) 103.8 F (39.9 C) (Rectal)    Resp (!) 28    LMP  (LMP Unknown)    SpO2 98%   Physical Exam Vitals and nursing note reviewed.  Constitutional:      General: She is not in acute distress.    Appearance: She is well-developed. She is ill-appearing.  HENT:     Head: Normocephalic and atraumatic.  Eyes:     Conjunctiva/sclera: Conjunctivae normal.  Cardiovascular:     Rate and Rhythm: Regular rhythm. Tachycardia present.     Heart sounds: No murmur heard. Pulmonary:     Effort: Pulmonary effort is normal. No respiratory distress.     Breath sounds: Normal breath sounds.  Abdominal:     Palpations: Abdomen is soft.     Tenderness: There is no abdominal tenderness.   Musculoskeletal:        General: No swelling.     Cervical back: Neck supple.  Skin:    General: Skin is warm and dry.     Capillary  Refill: Capillary refill takes less than 2 seconds.     Findings: Erythema (Right lower extremity) present.  Neurological:     Mental Status: She is alert.  Psychiatric:        Mood and Affect: Mood normal.    ED Results and Treatments Labs (all labs ordered are listed, but only abnormal results are displayed) Labs Reviewed  RESP PANEL BY RT-PCR (FLU A&B, COVID) ARPGX2  CULTURE, BLOOD (ROUTINE X 2)  CULTURE, BLOOD (ROUTINE X 2)  URINE CULTURE  LACTIC ACID, PLASMA  LACTIC ACID, PLASMA  COMPREHENSIVE METABOLIC PANEL  CBC WITH DIFFERENTIAL/PLATELET  PROTIME-INR  APTT  URINALYSIS, ROUTINE W REFLEX MICROSCOPIC                                                                                                                          Radiology No results found.  Pertinent labs & imaging results that were available during my care of the patient were reviewed by me and considered in my medical decision making (see MDM for details).  Medications Ordered in ED Medications  lactated ringers infusion (has no administration in time range)  lactated ringers bolus 1,000 mL (has no administration in time range)    And  lactated ringers bolus 1,000 mL (has no administration in time range)    And  lactated ringers bolus 500 mL (has no administration in time range)  ceFEPIme (MAXIPIME) 2 g in sodium chloride 0.9 % 100 mL IVPB (has no administration in time range)  metroNIDAZOLE (FLAGYL) IVPB 500 mg (has no administration in time range)  vancomycin (VANCOCIN) IVPB 1000 mg/200 mL premix (has no administration in time range)                                                                                                                                     Procedures .Critical Care Performed by: Teressa Lower, MD Authorized by: Teressa Lower, MD    Critical care provider statement:    Critical care time (minutes):  30   Critical care was necessary to treat or prevent imminent or life-threatening deterioration of the following conditions:  Sepsis   Critical care was time spent personally by me on the following activities:  Development of treatment plan with patient or surrogate, discussions with consultants, evaluation of patient's response to treatment, examination of patient, ordering and review of laboratory studies, ordering and review of  radiographic studies, ordering and performing treatments and interventions, pulse oximetry, re-evaluation of patient's condition and review of old charts  (including critical care time)  Medical Decision Making / ED Course   This patient presents to the ED for concern of AMS, Sepsis this involves an extensive number of treatment options, and is a complaint that carries with it a high risk of complications and morbidity.  The differential diagnosis includes cellulitis, neck fracture, urosepsis, intra-abdominal infection  MDM: Patient seen Emergency Department for evaluation of altered mental status and sepsis.  Physical exam reveals right lower extremity erythema that is warm to the touch but is otherwise unremarkable outside of a regular tachycardia.  ECG was sinus tachycardia.  Sepsis alert called and broad-spectrum antibiotics started with vancomycin, cefepime Flagyl.  CBC with a significant leukocytosis to 31.4, creatinine 1.2, BUN 27, AST 134, ALT 122, alk phos 169 urinalysis with 11-20 white blood cells, rare bacteria but negative nitrite and leuk esterase.  Chest x-ray negative.  CT chest abdomen pelvis is currently pending.  Patient signed out to oncoming provider.  Please see provider signout note for continuation of work-up.  Anticipate admission after resulting CT scans.   Additional history obtained:  -External records from outside source obtained and reviewed including: Chart review  including previous notes, labs, imaging, consultation notes   Lab Tests: -I ordered, reviewed, and interpreted labs.   The pertinent results include:   Labs Reviewed  RESP PANEL BY RT-PCR (FLU A&B, COVID) ARPGX2  CULTURE, BLOOD (ROUTINE X 2)  CULTURE, BLOOD (ROUTINE X 2)  URINE CULTURE  LACTIC ACID, PLASMA  LACTIC ACID, PLASMA  COMPREHENSIVE METABOLIC PANEL  CBC WITH DIFFERENTIAL/PLATELET  PROTIME-INR  APTT  URINALYSIS, ROUTINE W REFLEX MICROSCOPIC      EKG   EKG Interpretation  Date/Time:    Ventricular Rate:    PR Interval:    QRS Duration:   QT Interval:    QTC Calculation:   R Axis:     Text Interpretation:           Imaging Studies ordered: I ordered imaging studies including CTCAP, CTH, CXR I independently visualized and interpreted imaging. I agree with the radiologist interpretation   Medicines ordered and prescription drug management: Meds ordered this encounter  Medications   lactated ringers infusion   AND Linked Order Group    lactated ringers bolus 1,000 mL     Order Specific Question:   Enter Patient Weight in Kilograms     Answer:   81    lactated ringers bolus 1,000 mL     Order Specific Question:   Enter Patient Weight in Kilograms     Answer:   81    lactated ringers bolus 500 mL     Order Specific Question:   Enter Patient Weight in Kilograms     Answer:   81   ceFEPIme (MAXIPIME) 2 g in sodium chloride 0.9 % 100 mL IVPB    Order Specific Question:   Antibiotic Indication:    Answer:   Other Indication (list below)    Order Specific Question:   Other Indication:    Answer:   Unknown source   metroNIDAZOLE (FLAGYL) IVPB 500 mg    Order Specific Question:   Antibiotic Indication:    Answer:   Other Indication (list below)    Order Specific Question:   Other Indication:    Answer:   Unknown source   vancomycin (VANCOCIN) IVPB 1000 mg/200 mL premix    Order  Specific Question:   Indication:    Answer:   Other Indication (list below)     Order Specific Question:   Other Indication:    Answer:   Unknown source    -I have reviewed the patients home medicines and have made adjustments as needed  Critical interventions Fluid resuscitation, antipyretics, multiple antibiotics   Cardiac Monitoring: The patient was maintained on a cardiac monitor.  I personally viewed and interpreted the cardiac monitored which showed an underlying rhythm of: Sinus tach  Social Determinants of Health:  Factors impacting patients care include: Dementia   Reevaluation: After the interventions noted above, I reevaluated the patient and found that they have :improved  Co morbidities that complicate the patient evaluation  Past Medical History:  Diagnosis Date   Acute lower UTI 8/33/8250   Acute metabolic encephalopathy 5/39/7673   Diabetes mellitus ([place]) 12/28/2019   Diabetes mellitus without complication ([place])    Hypertension    Memory loss of unknown cause 12/03/2019      Dispostion: I considered admission for this patient, and patient likely will be admitted for sepsis.     Final Clinical Impression(s) / ED Diagnoses Final diagnoses:  None     '@PCDICTATION'$ @    Teressa Lower, MD 01/06/22 1715

## 2022-01-06 NOTE — ED Notes (Signed)
Patient's guardian updated via phone.

## 2022-01-06 NOTE — Progress Notes (Signed)
[facility] following for sepsis protocol. 

## 2022-01-06 NOTE — H&P (Signed)
History and Physical    PLEASE NOTE THAT DRAGON DICTATION SOFTWARE WAS USED IN THE CONSTRUCTION OF THIS NOTE.   Mary Swanson PYY:[id number] DOB: [birth year] DOA: 01/06/2022  PCP: Gerlene Fee, DO  Patient coming from: home   I have personally briefly reviewed patient's old medical records in [place]  Chief Complaint: Fever  HPI: Mary Swanson is a 66 y.o. female with medical history significant for advanced dementia, type 2 diabetes mellitus, essential hypertension, hyperlipidemia who is admitted to [facility] on 01/06/2022 with severe sepsis due to infected diabetic right foot wound after presenting from SNF to [facility] ED for evaluation of objective fever.   In the context of the patient's advanced imaging, the following history is provided via the patient's facility as well as via my discussions with the EDP and via chart review.  The patient reportedly has been experiencing 1 day of objective fever at her SNF, although associated temperature max over that timeframe is currently unclear to me.  However, in the setting of persistence of this objective fever, the patient was brought to [facility] long emergency department today for further evaluation and management thereof.   She has a documented history of type 2 diabetes mellitus for which she is on basal insulin as well as sliding scale Humalog in addition to metformin as an outpatient.  Per chart review, most recent prior serum creatinine was found to be 0.67 in July 2022.     ED Course:  Vital signs in the ED were notable for the following: Temperature max 103.8; initial heart rate 124, which is decreased to 96 following interval initiation of IV fluids; blood pressure 150/66 - 165/71 mmHg; respiratory rate 17-27, oxygen saturation 93 to 99% on room air.  Labs were notable for the following: CMP notable for the following BUN 27, creatinine 1.21, BUN/creatinine ratio 20.3, glucose 293, calcium corrected for mild  hypoalbuminemia 10.6, albumin 25.  Liver enzymes, in comparison to most recent prior set from July 2022 were as follows: Presenting alkaline phosphatase 169 compared to 66 in July 2022, AST 134 compared to 24, ALT 122 compared to 17, and total bilirubin 0.8, unchanged from value in July 2022.  CBC notable for white blood cell count 31,000 with 86% neutrophils.  Lactic acid 1.9.  INR 1.2.  Urinalysis notable for 11-20 white blood cells, rare bacteria, leukocyte esterase/nitrate negative, small hemoglobin without any evidence of RBCs, will showing 100 protein.  COVID-19/influenza PCR negative.  Blood cultures x2 collected prior to initiation of IV antibiotics.  Imaging and additional notable ED work-up: EKG showed sinus tachycardia with heart rate 117 normal intervals and no evidence of T wave or ST changes.  Chest x-ray showed no evidence of acute cardiopulmonary process.  CT chest abdomen pelvis with contrast notable for no evidence of acute intra-abdominal/intra pelvic process.  Plain films of the right foot showed no evidence of acute fracture, dislocation, or focal bone abnormality, but did show evidence of subcutaneous soft tissue swelling, without evidence of subcutaneous gas.   While in the ED, the following were administered: Acetaminophen 650 mg PR x1, cefepime, Flagyl, IV vancomycin, 2.5 L LR bolus followed by initiation of continuous LR at 150 cc/h.  Subsequently, the patient was admitted for further evaluation management of severe sepsis due to suspected infected diabetic right foot infection associated with right foot cellulitis, with presentation complicated by acute kidney injury, dehydration, and mild hypercalcemia.     Review of Systems: As per HPI otherwise 10 point  review of systems negative.   Past Medical History:  Diagnosis Date   Acute lower UTI 9/73/5329   Acute metabolic encephalopathy 08/20/2682   Diabetes mellitus ([place]) 12/28/2019   Diabetes mellitus without complication  ([place])    Hypertension    Memory loss of unknown cause 12/03/2019    Past Surgical History:  Procedure Laterality Date   CATARACT EXTRACTION, BILATERAL Bilateral     Social History:  reports that she has never smoked. She has never used smokeless tobacco. She reports that she does not currently use alcohol. She reports that she does not use drugs.   Allergies  Allergen Reactions   Erythromycin Diarrhea   Latex Hives   Lisinopril Cough    Family History  Problem Relation Age of Onset   Diabetes Neg Hx     Family history reviewed and not pertinent    Prior to Admission medications   Medication Sig Start Date End Date Taking? Authorizing Provider  acetaminophen (TYLENOL) 325 MG tablet Take 2 tablets (650 mg total) by mouth every 6 (six) hours as needed for mild pain (or Fever >/= 101). 06/18/21   Eugenie Filler, MD  AgaMatrix Ultra-Thin Lancets MISC Check blood glucose 3 times daily before meals and as needed 06/25/19   Mack Hook, MD  amLODipine (NORVASC) 10 MG tablet Take 1 tablet (10 mg total) by mouth daily. 01/02/20   Eugenie Filler, MD  aspirin 81 MG chewable tablet Chew 81 mg by mouth daily.     [provider]  Blood Glucose Monitoring Suppl (AGAMATRIX PRESTO) w/Device KIT Check blood glucose three times daily before meals and as needed 06/25/19   Mack Hook, MD  glucose 4 GM chewable tablet Chew 1 tablet by mouth as needed for low blood sugar.    [provider]  glucose blood (AGAMATRIX PRESTO TEST) test strip Check blood glucose 3 times daily before meals and as needed 06/25/19   Mack Hook, MD  insulin glargine (LANTUS SOLOSTAR) 100 UNIT/ML Solostar Pen Inject 18 Units into the skin daily. Patient taking differently: Inject 30 Units into the skin at bedtime. 06/18/21   Eugenie Filler, MD  insulin lispro (HUMALOG) 100 UNIT/ML KwikPen Inject 2-12 Units into the skin See admin instructions. Inject 2-12 units into the skin  three times a day, per sliding scale: BGL 151-200 = 2 units; 201-250 = 3 units; 251-300 = 4 units; 301-350 = 6 units; 351-400 = 10 units; >401 = 12 units and CALL NP    [provider]  Insulin Pen Needle (BD PEN NEEDLE NANO U/F) 32G X 4 MM MISC 1 Stick by Does not apply route every morning. 06/05/17   Eloise Levels, MD  LORazepam (ATIVAN) 0.5 MG tablet Take 1 tablet (0.5 mg total) by mouth in the morning and at bedtime. 06/18/21   Eugenie Filler, MD  metFORMIN (GLUCOPHAGE-XR) 500 MG 24 hr tablet Take 1,000 mg by mouth daily with breakfast.    [provider]  Multiple Vitamin (MULTIVITAMIN WITH MINERALS) TABS tablet Take 1 tablet by mouth daily. 01/02/20   Eugenie Filler, MD  QUEtiapine (SEROQUEL) 25 MG tablet Take 1 tablet (25 mg total) by mouth 2 (two) times daily as needed (agitation). 06/18/21   Eugenie Filler, MD  rosuvastatin (CRESTOR) 20 MG tablet Take 20 mg by mouth at bedtime.    [provider]  thiamine 100 MG tablet Take 1 tablet (100 mg total) by mouth daily. 01/01/20   Irine Seal  V, MD     Objective    Physical Exam: Vitals:   01/06/22 1900 01/06/22 1915 01/06/22 1930 01/06/22 1945  BP: (!) 150/69 (!) 159/66 (!) 160/65 (!) 159/59  Pulse: (!) 103 (!) 101 (!) 101 95  Resp: (!) 21 (!) 26 (!) 22 (!) 23  Temp:      TempSrc:      SpO2: 93% 93% 93% 92%  Weight:      Height:        General: appears to be stated age; somnolent;  Skin: warm, dry, no rash Head:  AT/[place] Mouth:  Oral mucosa membranes appear dry, normal dentition Neck: supple; trachea midline Heart: Mildly tachycardic, but regular; did not appreciate any M/R/G Lungs: CTAB, did not appreciate any wheezes, rales, or rhonchi Abdomen: + BS; soft, ND, NT Vascular: 2+ pedal pulses b/l; 2+ radial pulses b/l Extremities: Ulcer on plantar aspect of lateral right midfoot as well as erythema associated with the medial aspect of the dorsal surface of right foot, with increased warmth  to touch as well as mild swelling in the absence of crepitus. Neuro:  In the setting of the patient's current mental status and associated inability to follow instructions, unable to perform full neurologic exam at this time.  As such, assessment of strength, sensation, and cranial nerves is limited at this time. Patient noted to spontaneously move all 4 extremities. No tremors.      Labs on Admission: I have personally reviewed following labs and imaging studies  CBC: Recent Labs  Lab 01/06/22 1341  WBC 31.4*  NEUTROABS 26.7*  HGB 12.1  HCT 39.2  MCV 100.3*  PLT 161   Basic Metabolic Panel: Recent Labs  Lab 01/06/22 1516  NA 144  K 4.2  CL 109  CO2 27  GLUCOSE 293*  BUN 27*  CREATININE 1.21*  CALCIUM 9.4   GFR: Estimated Creatinine Clearance: 56.3 mL/min (A) (by C-G formula based on SCr of 1.21 mg/dL (H)). Liver Function Tests: Recent Labs  Lab 01/06/22 1516  AST 134*  ALT 122*  ALKPHOS 169*  BILITOT 0.8  PROT 7.9  ALBUMIN 2.5*   No results for input(s): LIPASE, AMYLASE in the last 168 hours. No results for input(s): AMMONIA in the last 168 hours. Coagulation Profile: Recent Labs  Lab 01/06/22 1341  INR 1.2   Cardiac Enzymes: No results for input(s): CKTOTAL, CKMB, CKMBINDEX, TROPONINI in the last 168 hours. BNP (last 3 results) No results for input(s): PROBNP in the last 8760 hours. HbA1C: No results for input(s): HGBA1C in the last 72 hours. CBG: No results for input(s): GLUCAP in the last 168 hours. Lipid Profile: No results for input(s): CHOL, HDL, LDLCALC, TRIG, CHOLHDL, LDLDIRECT in the last 72 hours. Thyroid Function Tests: No results for input(s): TSH, T4TOTAL, FREET4, T3FREE, THYROIDAB in the last 72 hours. Anemia Panel: No results for input(s): VITAMINB12, FOLATE, FERRITIN, TIBC, IRON, RETICCTPCT in the last 72 hours. Urine analysis:    Component Value Date/Time   COLORURINE AMBER (A) 01/06/2022 1341   APPEARANCEUR CLOUDY (A)  01/06/2022 1341   LABSPEC 1.023 01/06/2022 1341   PHURINE 5.0 01/06/2022 1341   GLUCOSEU 50 (A) 01/06/2022 1341   HGBUR SMALL (A) 01/06/2022 1341   BILIRUBINUR NEGATIVE 01/06/2022 1341   KETONESUR NEGATIVE 01/06/2022 1341   PROTEINUR 100 (A) 01/06/2022 1341   NITRITE NEGATIVE 01/06/2022 1341   LEUKOCYTESUR NEGATIVE 01/06/2022 1341    Radiological Exams on Admission: CT Head Wo Contrast  Result Date: 01/06/2022 CLINICAL DATA:  Delirium EXAM: CT HEAD WITHOUT CONTRAST TECHNIQUE: Contiguous axial images were obtained from the base of the skull through the vertex without intravenous contrast. RADIATION DOSE REDUCTION: This exam was performed according to the departmental dose-optimization program which includes automated exposure control, adjustment of the mA and/or kV according to patient size and/or use of iterative reconstruction technique. COMPARISON:  CT head 06/15/2021 BRAIN: BRAIN Cerebral ventricle sizes are concordant with the degree of cerebral volume loss. Mild patchy and confluent areas of decreased attenuation are noted throughout the deep and periventricular white matter of the cerebral hemispheres bilaterally, compatible with chronic microvascular ischemic disease. No evidence of large-territorial acute infarction. No parenchymal hemorrhage. No mass lesion. No extra-axial collection. No mass effect or midline shift. No hydrocephalus. Basilar cisterns are patent. Vascular: No hyperdense vessel. Atherosclerotic calcifications are present within the cavernous internal carotid arteries. Skull: No acute fracture or focal lesion. Sinuses/Orbits: Frothy mucosal thickening within the bilateral sphenoid sinuses and right ethmoid sinus paranasal sinuses and mastoid air cells are clear. Bilateral lens replacement. Otherwise the orbits are unremarkable. Other: None. IMPRESSION: No acute intracranial abnormality. Electronically Signed   By: Iven Finn M.D.   On: 01/06/2022 17:20   CT CHEST ABDOMEN  PELVIS W CONTRAST  Result Date: 01/06/2022 CLINICAL DATA:  Altered mental status.  Sepsis. EXAM: CT CHEST, ABDOMEN, AND PELVIS WITH CONTRAST TECHNIQUE: Multidetector CT imaging of the chest, abdomen and pelvis was performed following the standard protocol during bolus administration of intravenous contrast. RADIATION DOSE REDUCTION: This exam was performed according to the departmental dose-optimization program which includes automated exposure control, adjustment of the mA and/or kV according to patient size and/or use of iterative reconstruction technique. CONTRAST:  60m OMNIPAQUE IOHEXOL 300 MG/ML  SOLN COMPARISON:  None. FINDINGS: CT CHEST FINDINGS Cardiovascular: Normal heart size. No significant pericardial effusion. The thoracic aorta is normal in caliber. Mild atherosclerotic plaque of the thoracic aorta. No coronary artery calcifications. Mediastinum/Nodes: No enlarged mediastinal, hilar, or axillary lymph nodes. Thyroid gland, trachea, and esophagus demonstrate no significant findings. Small to moderate volume hiatal hernia. Right paratracheal/precarinal conglomerative lymph node measuring up to 1.3 cm (2:22). Lungs/Pleura: Bilateral lower lobe subsegmental atelectasis. Mild biapical interlobular septal wall thickening. Query scattered tree-in-bud nodularity along the bilateral apices. No focal consolidation. No pulmonary nodule. No pulmonary mass. No pleural effusion. No pneumothorax. Musculoskeletal: No chest wall abnormality. Bilateral peripherally calcified breast implants. No suspicious lytic or blastic osseous lesions. No acute displaced fracture. CT ABDOMEN PELVIS FINDINGS Hepatobiliary: No focal liver abnormality. No gallstones, gallbladder wall thickening, or pericholecystic fluid. No biliary dilatation. Pancreas: No focal lesion. Normal pancreatic contour. No surrounding inflammatory changes. No main pancreatic ductal dilatation. Spleen: Normal in size without focal abnormality.  Adrenals/Urinary Tract: No adrenal nodule bilaterally. Bilateral kidneys enhance symmetrically. Subcentimeter hypodensities too small to characterize. No hydronephrosis. No hydroureter. The urinary bladder is unremarkable. On delayed imaging, there is no urothelial wall thickening and there are no filling defects in the opacified portions of the bilateral collecting systems or ureters. Stomach/Bowel: Stomach is within normal limits. No evidence of bowel wall thickening or dilatation. Stool throughout the colon. Appendix appears normal. Vascular/Lymphatic: No abdominal aorta or iliac aneurysm. Mild atherosclerotic plaque of the aorta and its branches. Prominent but nonenlarged right inguinal lymph node measuring up to 1.4 cm. No abdominal, pelvic, or inguinal lymphadenopathy. Reproductive: Uterus and bilateral adnexa are unremarkable. Other: No intraperitoneal free fluid. No intraperitoneal free gas. No organized fluid collection. Musculoskeletal: Small fat containing umbilical hernia. No suspicious lytic or  blastic osseous lesions. No acute displaced fracture. IMPRESSION: 1. Vague bilateral lung apices changes may represent developing atypical infection versus mild pulmonary edema. 2. Nonspecific right paratracheal/precarinal enlarged lymph node. 3. Small to moderate volume hiatal hernia. 4. Stool throughout the colon.  Correlate for constipation. 5. No acute intra-abdominal or intrapelvic abnormality. 6.  Aortic Atherosclerosis (ICD10-I70.0). Electronically Signed   By: Iven Finn M.D.   On: 01/06/2022 17:16   DG Chest Port 1 View  Result Date: 01/06/2022 CLINICAL DATA:  A 66 year old fee female presents for evaluation of potential sepsis. EXAM: PORTABLE CHEST 1 VIEW COMPARISON:  June 15, 2021. FINDINGS: EKG leads project over the chest. Cardiomediastinal contours and hilar structures are stable. No lobar consolidation.  No effusion or visible pneumothorax. Partially calcified breast implants project over  the chest as before. On limited assessment there is no acute skeletal process. IMPRESSION: No acute cardiopulmonary process. Electronically Signed   By: Zetta Bills M.D.   On: 01/06/2022 14:29   DG Foot Complete Right  Result Date: 01/06/2022 CLINICAL DATA:  Sepsis, foot wound EXAM: RIGHT FOOT COMPLETE - 3+ VIEW COMPARISON:  None. FINDINGS: There is no evidence of fracture or dislocation. There is no evidence of arthropathy or other focal bone abnormality. Subcutaneus soft tissue edema of the forefoot/midfoot dorsum. Vascular calcifications. IMPRESSION: Negative. Electronically Signed   By: Iven Finn M.D.   On: 01/06/2022 18:19     EKG: Independently reviewed, with result as described above.    Assessment/Plan    Principal Problem:   Diabetic foot infection ([place]) Active Problems:   AKI (acute kidney injury) ([place])   Dehydration   Essential hypertension   Severe sepsis ([facility])   Fever   Hypercalcemia   Acute prerenal azotemia   Transaminitis     #) Severe sepsis due to diabetic right foot infection: Erythema, increased swelling, increased warmth to touch associated with the medial aspect of the right foot along the dorsal surface, with suspected portal of entry in the form of aforementioned ulcer along the plantar surface of the right foot, without any associated drainage noted.  This is in the context of a known history of type 2 diabetes mellitus.  Additionally, plain films of the right foot showed evidence of subcutaneous soft tissue swelling consistent with cellulitis, in the absence of any evidence of subcutaneous gas.  Additionally, in the absence of any crepitus on physical exam, necrotizing fasciitis is felt to be less likely at this time.  No radiographic evidence to suggest osteomyelitis at this time.  We will add on CRP and ESR to further evaluate, with consideration for MRI of the right foot should these values return elevated.  SIRS criteria met via presenting  objective fever, tachycardia, tachypnea, and leukocytosis.  Patient sepsis meets criteria to be considered severe in nature on the basis of evidence of concomitant endorgan damage in the form of presenting acute kidney injury.  Of note, initial lactate nonelevated at 1.9, and no evidence of hypotension. Of note, when taking into account patient's ideal body weight based upon her documented height, she has received greater than 30 mL/kg IVF bolus this evening.   No overt additional underlying source of infection at this time, including urinalysis, which shows some pyuria, but not to threshold of quantitative significance in this female patient.  Additionally, COVID-19/influenza PCR negative, while chest x-ray shows no evidence of acute cardiopulmonary process, including no evidence of infiltrate.  Review of presenting labs reveals what appears to be acute transaminitis and a  noncholestatic pattern.  However, CT abdomen/pelvis shows no evidence of acute intra-abdominal/intrapelvic process, including no evidence of acute cholecystitis, nor any evidence of common bile duct dilation or overt radiopaque choledocholithiasis.  Consequently, will continue to trend transaminases, and add on GGT to further evaluate mildly elevated alk phos level in the absence of any corresponding elevation in bilirubin.  Of note, per my discussions with the patient pharmacist, current recommended regimen for severe sepsis due to diabetic foot infection in a patient who presents from SNF is as follows: IV vancomycin, Rocephin, Flagyl, which would also provide adequate intra-abdominal infection coverage, including anaerobic provision.     Plan: We will reduce rate of continuous IV fluids to 100 cc/h.  Monitor for results of blood cultures x2 collected prior to initiation of IV antibiotics.  IV vancomycin, Rocephin, and Flagyl, as above.  Repeat CBC with differential in the morning.  Repeat CMP in the a.m., and check GGT.  CRP/ESR, as  above.  Procalcitonin.       #) Acute Kidney Injury:   Suspect that this is prerenal in nature d/t potential multifactorial contributions including dehydration given clinical indications for such, as well as suspected additional contribution to intravascular depletion from severe sepsis itself d/t associated increase in capillary permeability, in the context of presenting diabetic right foot infection, as above.  Urinalysis with microscopy notable for 100 protein as well as a small amount of hemoglobin in the absence of any associated RBCs, prompting checking a CPK level.     Plan: monitor strict I's & O's and daily weights. Attempt to avoid nephrotoxic agents. Refrain from NSAIDs. Repeat BMP in the morning. Check serum magnesium level. Add-on random urine sodium and random urine creatinine.  Check CPK level.  IV fluids as above.  Further evaluation management of presenting severe sepsis due to diabetic right foot infection, as above.       #) Hypercalcemia: Presenting labs reflect serum calcium of 10.6 following adjustment for concomitant hypoalbuminemia, as above.  Suspect element of dehydration, without overt pharmacologic contribution. Will continue IVF's, as above, with repeat calcium level in the morning, with consideration for further expansion of work-up if no ensuing improvement in serum calcium level following interval IVF administration.     Plan: LR, as above.  Monitor strict I's&O's, daily weights.  CMP in the morning.  Check serum Mg and Phos levels.        #) Dehydration: Clinical suspicion for such, including the appearance of dry oral mucous membranes as well as laboratory findings notable for acute prerenal azotemia and UA demonstrating elevated specific gravity.  No e/o associated hypotension.   Plan: Monitor strict I's and O's.  Daily weights.  Repeat BMP in the morning. IVF's, as above.           #) Type 2 Diabetes Mellitus: documented history of such.  Home insulin regimen: Lantus 30 and subcu nightly as well as Humalog sliding scale 3 times daily with meals. Home oral hypoglycemic agents: Metformin. presenting blood sugar: 293, in the absence of any anion gap metabolic acidosis to suggest DKA.  Will initiate approximately half of home basal insulin dose, as further quantified below.    Plan: accuchecks every 6 hours with low dose SSI.  Lantus 15 to subcu nightly.  Hold home metformin during this hospitalization.       #) Essential Hypertension: documented h/o such, with outpatient antihypertensive regimen including Norvasc.  SBP's in the ED today: In the 150s to 160s mmHg.  However,  in the setting of presenting severe sepsis, will hold home Norvasc for now, we will closely monitoring interval blood pressures.  Plan: Close monitoring of subsequent BP via routine VS. hold home Norvasc for now.       #) Hyperlipidemia: documented h/o such. On high intensity rosuvastatin as outpatient.  In the setting of apparent acute transaminitis, will hold home statin for now pending further trending of transaminases.   Plan: Hold home statin.        DVT prophylaxis: SCD's   Code Status: Full code Family Communication: none Disposition Plan: Per Rounding Team Consults called: none;  Admission status: Inpatient; pcu   PLEASE NOTE THAT DRAGON DICTATION SOFTWARE WAS USED IN THE CONSTRUCTION OF THIS NOTE.   [place] DO [facility] [place]   01/06/2022, 9:35 PM

## 2022-01-06 NOTE — ED Notes (Signed)
Lynnae Prude said he is also the legal guardian and would like to be called for an update by the doctor or nurse.

## 2022-01-06 NOTE — ED Notes (Signed)
[name] wants the nurse to give him an update on the patient. He said he was the POA.

## 2022-01-06 NOTE — Progress Notes (Signed)
A consult was received from an ED physician for Cefepime, Vancomycin per pharmacy dosing.  The patient's profile has been reviewed for ht/wt/allergies/indication/available labs.   A one time order has been placed for Cefepime 2g, Vancomycin 2g.  Further antibiotics/pharmacy consults should be ordered by admitting physician if indicated.                       Thank you,  Lynann Beaver PharmD, BCPS Clinical Pharmacist [facility] main pharmacy [phone] 01/06/2022 1:45 PM

## 2022-01-06 NOTE — ED Provider Notes (Signed)
4:50 PM-echo from Dr. Earle Gell to evaluate patient after return of imaging, and arrange disposition.  She had presented earlier today for evaluation of tachycardia and fever.  She has chronic advanced dementia, and is unable to contribute to history.  She has pain and swelling of the right foot and toes, with concerns for sepsis on initial evaluation.  She has received broad-spectrum antibiotics.  Sole of right foot has a ulcer, distal, lateral, potentially source of infection.  Patient is history of diabetes.  Plain x-ray of right foot does not indicate osteomyelitis or fracture.  Will consult hospitalist to admit patient to treat diabetic foot infection.  Discussed with hospitalist, he will admit the patient.    Mancel Bale, MD 01/06/22 [phone]

## 2022-01-07 ENCOUNTER — Inpatient Hospital Stay (HOSPITAL_COMMUNITY): Payer: Medicare Other

## 2022-01-07 DIAGNOSIS — E1165 Type 2 diabetes mellitus with hyperglycemia: Secondary | ICD-10-CM

## 2022-01-07 LAB — CBC WITH DIFFERENTIAL/PLATELET
Abs Immature Granulocytes: 0.34 10*3/uL — ABNORMAL HIGH (ref 0.00–0.07)
Basophils Absolute: 0.1 10*3/uL (ref 0.0–0.1)
Basophils Relative: 0 %
Eosinophils Absolute: 0 10*3/uL (ref 0.0–0.5)
Eosinophils Relative: 0 %
HCT: 33.2 % — ABNORMAL LOW (ref 36.0–46.0)
Hemoglobin: 10.3 g/dL — ABNORMAL LOW (ref 12.0–15.0)
Immature Granulocytes: 2 %
Lymphocytes Relative: 11 %
Lymphs Abs: 2.2 10*3/uL (ref 0.7–4.0)
MCH: 30.8 pg (ref 26.0–34.0)
MCHC: 31 g/dL (ref 30.0–36.0)
MCV: 99.4 fL (ref 80.0–100.0)
Monocytes Absolute: 1.2 10*3/uL — ABNORMAL HIGH (ref 0.1–1.0)
Monocytes Relative: 6 %
Neutro Abs: 15.5 10*3/uL — ABNORMAL HIGH (ref 1.7–7.7)
Neutrophils Relative %: 81 %
Platelets: 259 10*3/uL (ref 150–400)
RBC: 3.34 MIL/uL — ABNORMAL LOW (ref 3.87–5.11)
RDW: 12.7 % (ref 11.5–15.5)
Smear Review: NORMAL
WBC: 19.3 10*3/uL — ABNORMAL HIGH (ref 4.0–10.5)
nRBC: 0 % (ref 0.0–0.2)

## 2022-01-07 LAB — BLOOD CULTURE ID PANEL (REFLEXED) - BCID2

## 2022-01-07 LAB — COMPREHENSIVE METABOLIC PANEL
ALT: 85 U/L — ABNORMAL HIGH (ref 0–44)
AST: 57 U/L — ABNORMAL HIGH (ref 15–41)
Albumin: 2.3 g/dL — ABNORMAL LOW (ref 3.5–5.0)
Alkaline Phosphatase: 139 U/L — ABNORMAL HIGH (ref 38–126)
Anion gap: 8 (ref 5–15)
BUN: 22 mg/dL (ref 8–23)
CO2: 27 mmol/L (ref 22–32)
Calcium: 9.6 mg/dL (ref 8.9–10.3)
Chloride: 112 mmol/L — ABNORMAL HIGH (ref 98–111)
Creatinine, Ser: 1.22 mg/dL — ABNORMAL HIGH (ref 0.44–1.00)
GFR, Estimated: 49 mL/min — ABNORMAL LOW (ref >60)
Glucose, Bld: 113 mg/dL — ABNORMAL HIGH (ref 70–99)
Potassium: 3.7 mmol/L (ref 3.5–5.1)
Sodium: 147 mmol/L — ABNORMAL HIGH (ref 135–145)
Total Bilirubin: 0.7 mg/dL (ref 0.3–1.2)
Total Protein: 7.9 g/dL (ref 6.5–8.1)

## 2022-01-07 LAB — SEDIMENTATION RATE: Sed Rate: 35 mm/hr — ABNORMAL HIGH (ref 0–22)

## 2022-01-07 LAB — TSH: TSH: 0.647 u[IU]/mL (ref 0.350–4.500)

## 2022-01-07 LAB — TYPE AND SCREEN
ABO/RH(D): O POS
Antibody Screen: NEGATIVE

## 2022-01-07 LAB — PHOSPHORUS: Phosphorus: 3.1 mg/dL (ref 2.5–4.6)

## 2022-01-07 LAB — CBG MONITORING, ED
Glucose-Capillary: 113 mg/dL — ABNORMAL HIGH (ref 70–99)
Glucose-Capillary: 170 mg/dL — ABNORMAL HIGH (ref 70–99)
Glucose-Capillary: 92 mg/dL (ref 70–99)

## 2022-01-07 LAB — VITAMIN B12: Vitamin B-12: 501 pg/mL (ref 180–914)

## 2022-01-07 LAB — SODIUM, URINE, RANDOM: Sodium, Ur: 78 mmol/L

## 2022-01-07 LAB — CK: Total CK: 217 U/L (ref 38–234)

## 2022-01-07 LAB — URINE CULTURE: Culture: NO GROWTH

## 2022-01-07 LAB — T4, FREE: Free T4: 0.92 ng/dL (ref 0.61–1.12)

## 2022-01-07 LAB — GAMMA GT: GGT: 160 U/L — ABNORMAL HIGH (ref 7–50)

## 2022-01-07 LAB — AMMONIA: Ammonia: 13 umol/L (ref 9–35)

## 2022-01-07 LAB — MAGNESIUM
Magnesium: 1.9 mg/dL (ref 1.7–2.4)
Magnesium: 2 mg/dL (ref 1.7–2.4)

## 2022-01-07 LAB — GLUCOSE, CAPILLARY
Glucose-Capillary: 373 mg/dL — ABNORMAL HIGH (ref 70–99)
Glucose-Capillary: 305 mg/dL — ABNORMAL HIGH (ref 70–99)

## 2022-01-07 LAB — C-REACTIVE PROTEIN: CRP: 25.7 mg/dL — ABNORMAL HIGH (ref <1.0)

## 2022-01-07 LAB — PROCALCITONIN: Procalcitonin: 6.57 ng/mL

## 2022-01-07 LAB — ABO/RH: ABO/RH(D): O POS

## 2022-01-07 LAB — CREATININE, URINE, RANDOM: Creatinine, Urine: 144.59 mg/dL

## 2022-01-07 IMAGING — MR MR FOOT*R* W/O CM
5 series · 38 of 40 positions shown · non-contrast
Comparison: X-ray [DATE]

CLINICAL DATA: Foot swelling, nondiabetic, osteomyelitis suspected

EXAM:
MRI OF THE RIGHT FOREFOOT WITHOUT CONTRAST
TECHNIQUE: Multiplanar, multisequence MR imaging of the right forefoot was
performed. No intravenous contrast was administered.

[Series 4: T1 · coronal · right · 3.5mm · 0.47mm/px · 8 of 35 slices shown (1 of 2)]
[im 1/35]
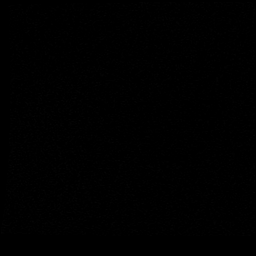
[im 4/35]
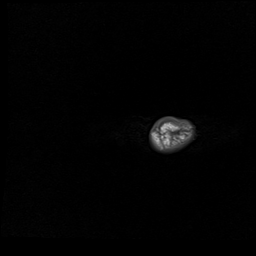
[im 12/35]
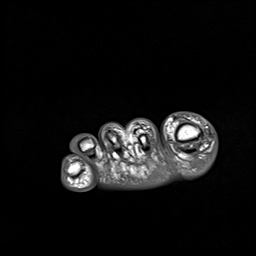
[im 16/35]
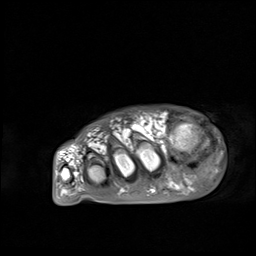
[im 19/35]
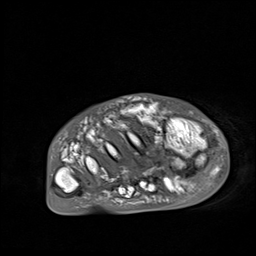
[im 23/35]
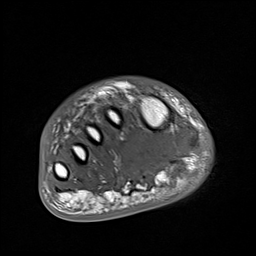
[im 31/35]
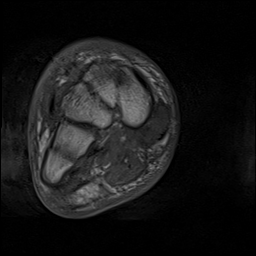
[im 35/35]
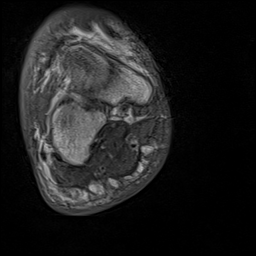

[Series 5: T2 fat-sat · coronal · right · 3.5mm · 0.38mm/px · 10 of 35 slices shown (1 of 2)]
[im 1/35]
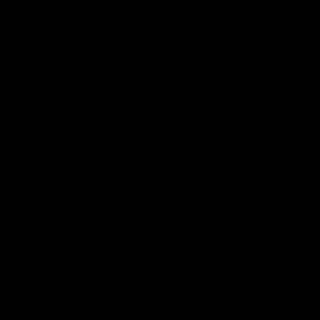
[im 4/35]
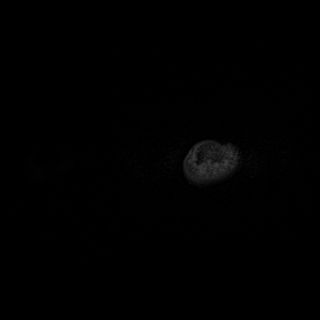
[im 8/35]
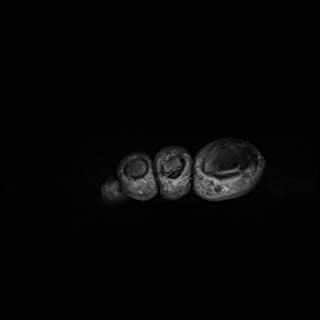
[im 12/35]
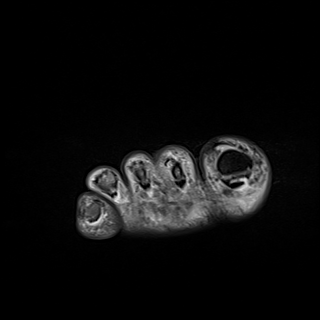
[im 16/35]
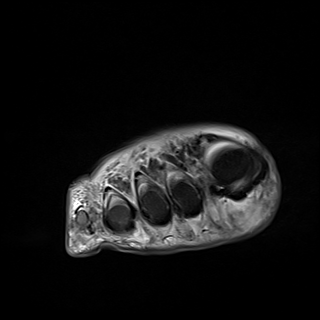
[im 19/35]
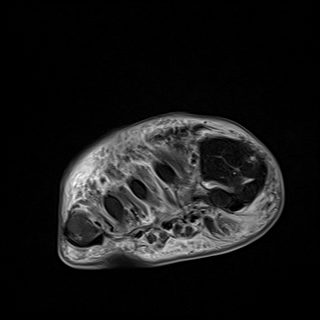
[im 23/35]
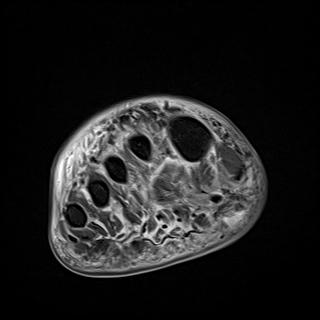
[im 27/35]
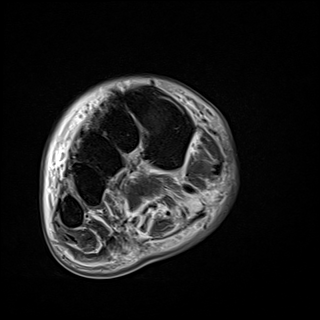
[im 31/35]
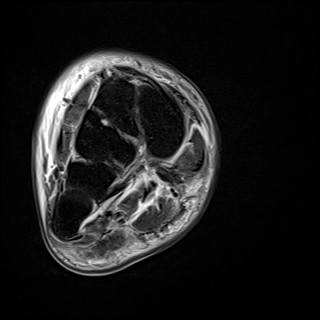
[im 35/35]
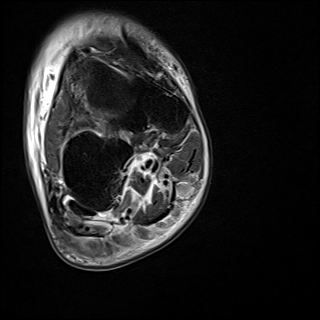

[Series 6: T2 fat-sat · axial · right · 3.2mm · 0.74mm/px · z∈[-181,-108]mm · 6 of 22 slices shown (2 of 2)]
[im 1/22]
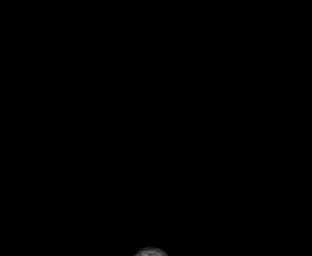
[im 5/22]
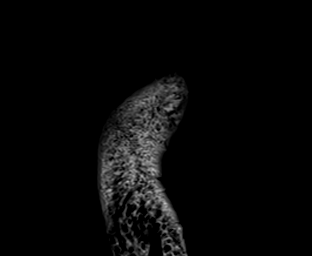
[im 9/22]
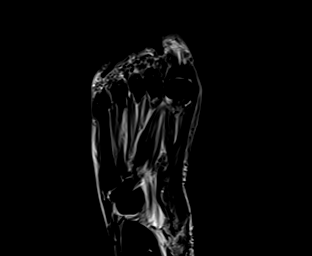
[im 13/22]
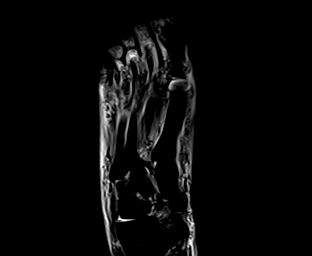
[im 17/22]
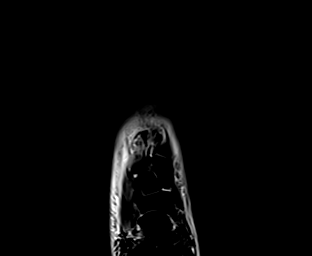
[im 22/22]
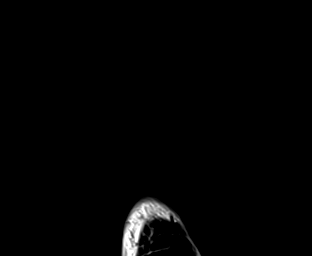

[Series 7: T1 · axial · right · 3.2mm · 0.76mm/px · z∈[-184,-111]mm · 6 of 22 slices shown (2 of 2)]
[im 1/22]
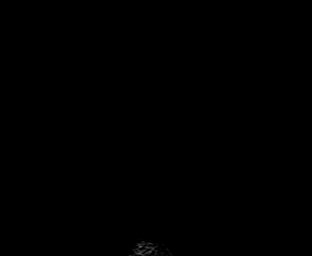
[im 5/22]
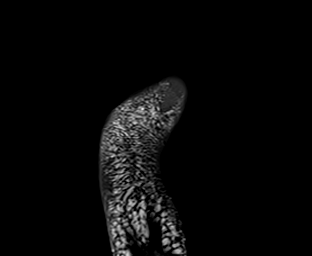
[im 9/22]
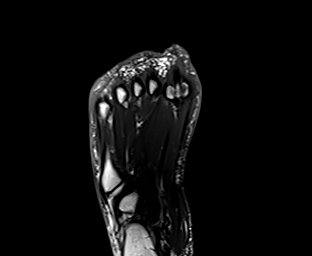
[im 13/22]
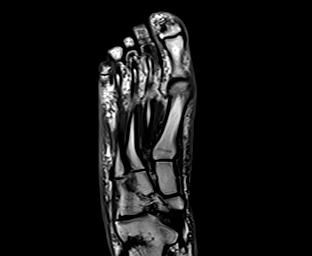
[im 17/22]
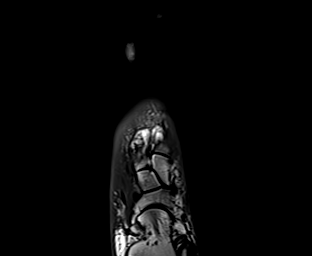
[im 22/22]
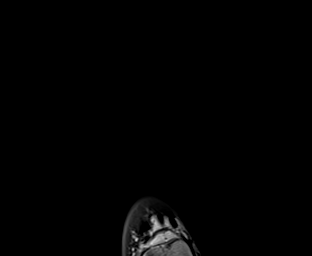

[Series 8: STIR · sagittal · right · 3.0mm · 0.43mm/px · 8 of 27 slices shown]
[im 1/27]
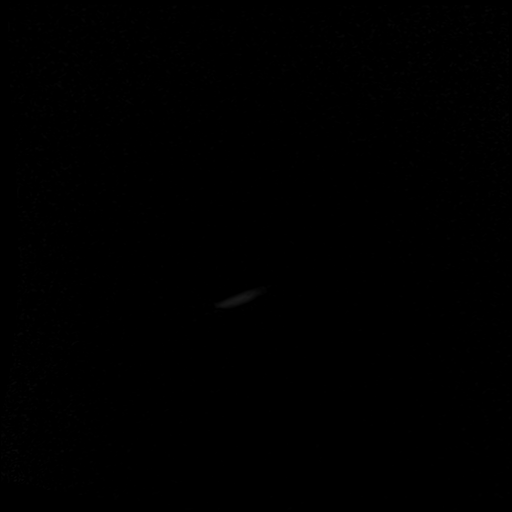
[im 4/27]
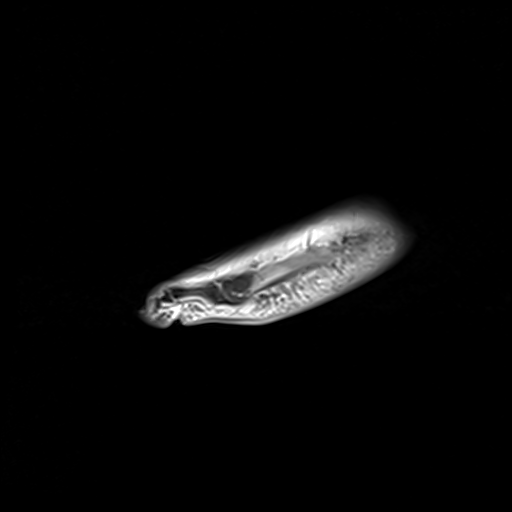
[im 8/27]
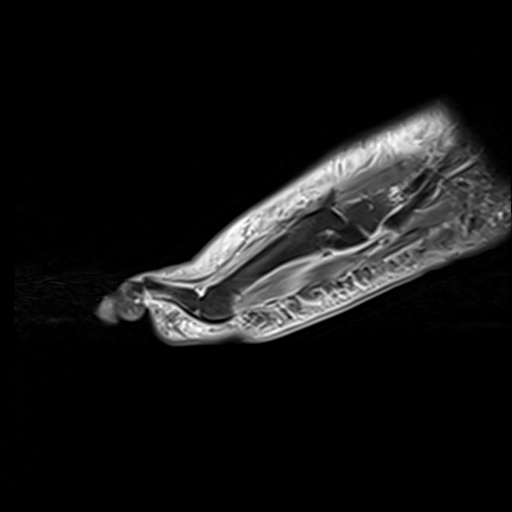
[im 12/27]
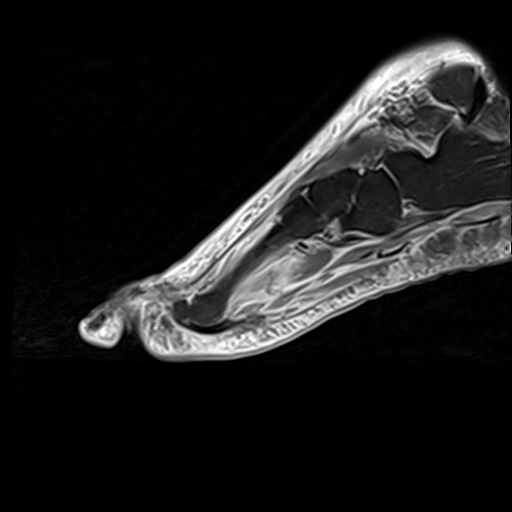
[im 15/27]
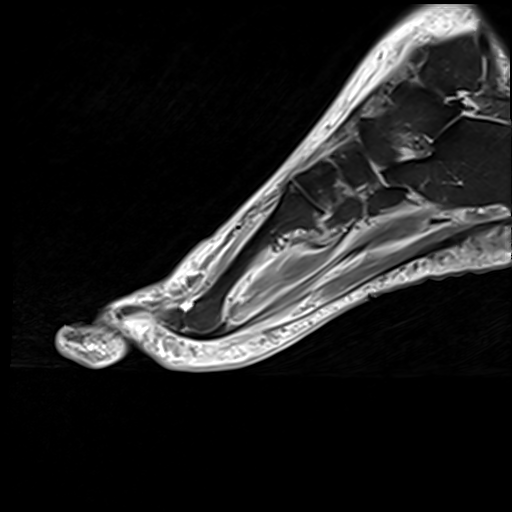
[im 19/27]
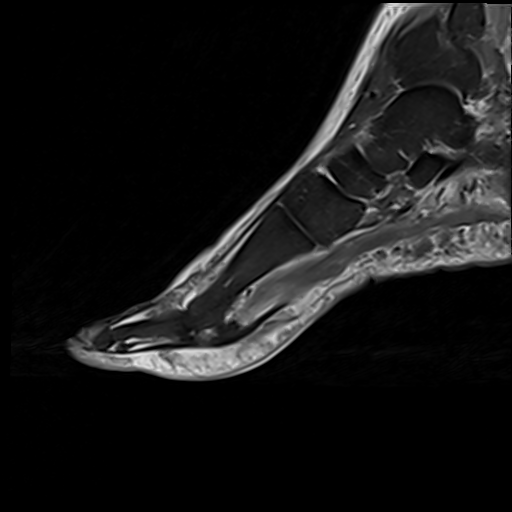
[im 23/27]
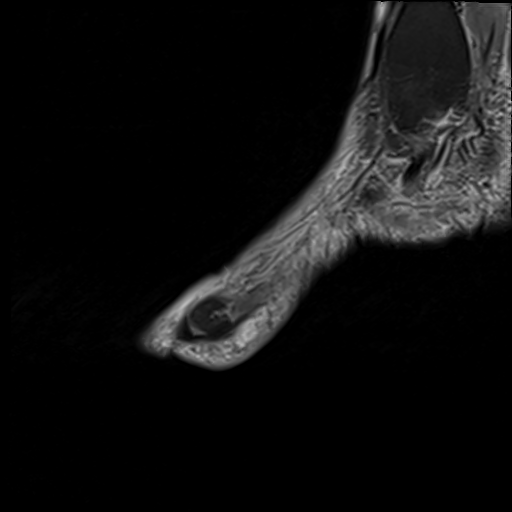
[im 27/27]
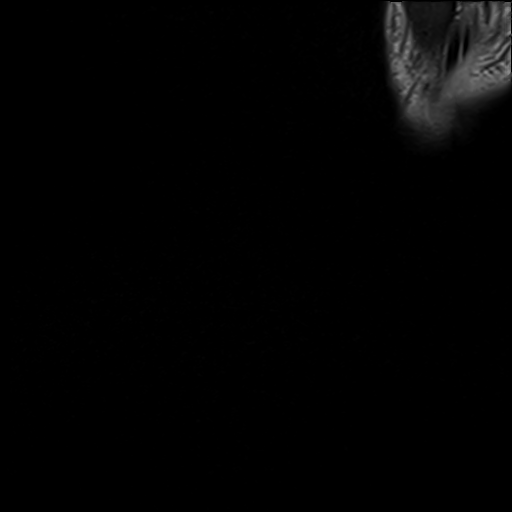

[38 of 40 positions shown; findings below may reference images not displayed]

FINDINGS: Bones/Joint/Cartilage

No acute fracture. No dislocation. No focal bone erosion or marrow
replacement. No bone marrow edema. Mild arthropathy is most
pronounced at the first MTP joint. No effusion. No bone lesion.

Ligaments

Intact Lisfranc ligament. Collateral ligaments of the forefoot are
intact.

Muscles and Tendons

Diffuse edema-like signal throughout the foot musculature may
represent changes related to denervation or myositis. Intact flexor
and extensor tendons. Trace tenosynovitis of the flexor hallucis
longus tendon at the level of the great toe proximal phalanx. No
tendinous disruption.

Soft tissues

Circumferential soft tissue edema. Small areas of slightly more
confluent fluid signal including at the plantar-medial aspect of the
foot at the level of the first metatarsal neck measuring 14 x 7 x 13
mm (series 5, image 21). Additional superficial collection at the
plantar aspect of the forefoot at the level of the first metatarsal
diaphysis measuring 15 x 4 x 7 mm (series 5, image 23). No discrete
ulceration
IMPRESSION: 1. No evidence of acute osteomyelitis of the right forefoot.
2. Circumferential soft tissue edema. Small areas of slightly more
confluent fluid within the plantar-medial soft tissues underlying
the first metatarsal concerning for small developing abscesses.
3. Trace tenosynovitis of the flexor hallucis longus tendon at the
level of the great toe proximal phalanx.
4. Diffuse edema-like signal throughout the foot musculature may
represent changes related to denervation or myositis.

## 2022-01-07 IMAGING — MR MR HEAD W/O CM
11 series · 48 of 48 positions shown · non-contrast
Comparison: Head CT [DATE] and MRI [DATE]

CLINICAL DATA: Mental status change, unknown cause.

EXAM:
MRI HEAD WITHOUT CONTRAST
TECHNIQUE: Multiplanar, multiecho pulse sequences of the brain and surrounding
structures were obtained without intravenous contrast.

[Series 5: dwi_tracew · axial · 3.0mm · 1.08mm/px · z∈[-63,+93]mm · 8 of 106 slices shown]
[im 1/106]
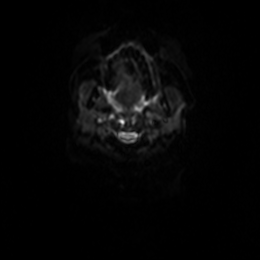
[im 16/106]
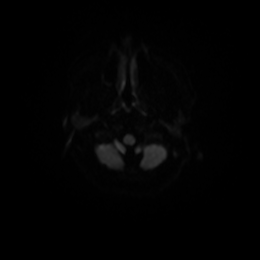
[im 31/106]
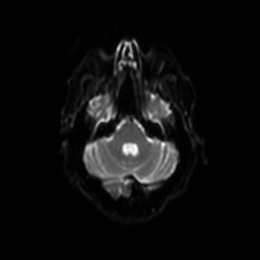
[im 46/106]
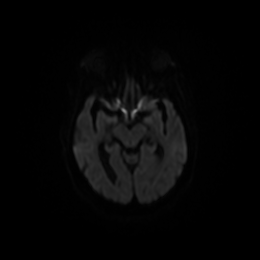
[im 61/106]
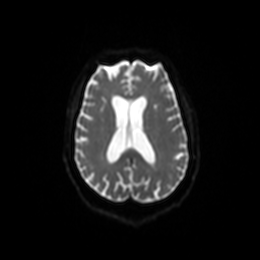
[im 76/106]
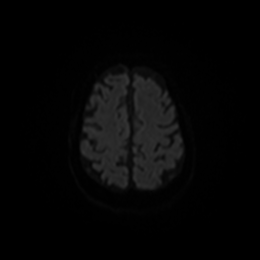
[im 91/106]
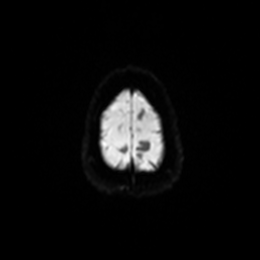
[im 106/106]
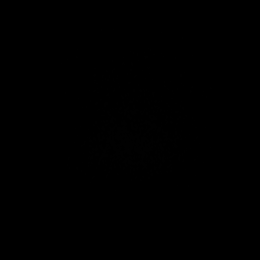

[Series 6: dwi_adc · axial · 3.0mm · 1.08mm/px · z∈[-63,+90]mm · 4 of 52 slices shown]
[im 1/52]
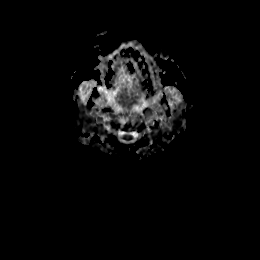
[im 18/52]
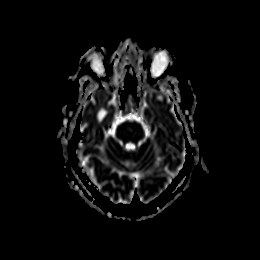
[im 35/52]
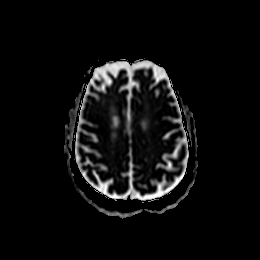
[im 52/52]
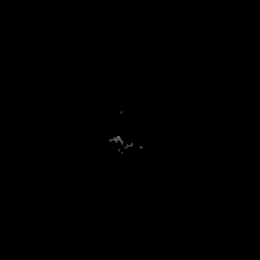

[Series 7: T2 · sagittal · 5.0mm · 0.47mm/px · 2 of 24 slices shown (1 of 3)]
[im 1/24]
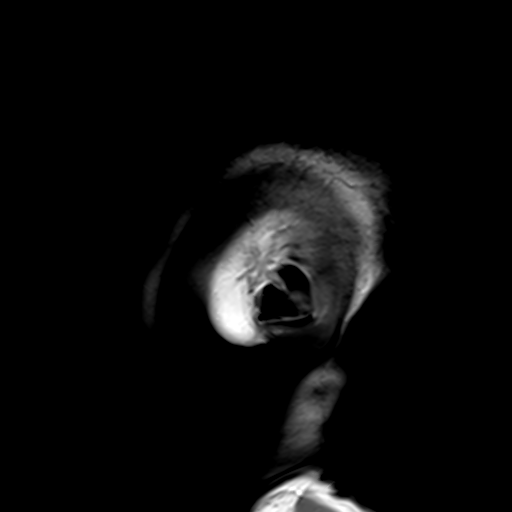
[im 24/24]
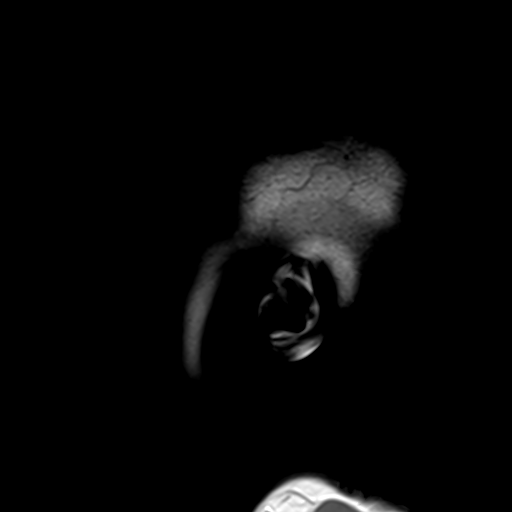

[Series 8: T2 · axial · 5.0mm · 0.45mm/px · z∈[-64,+92]mm · 2 of 25 slices shown (2 of 3)]
[im 1/25]
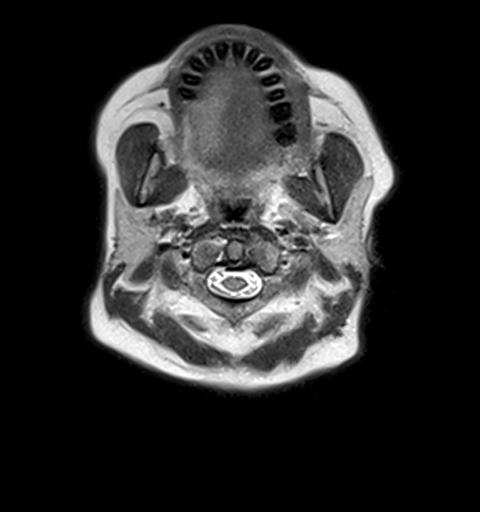
[im 25/25]
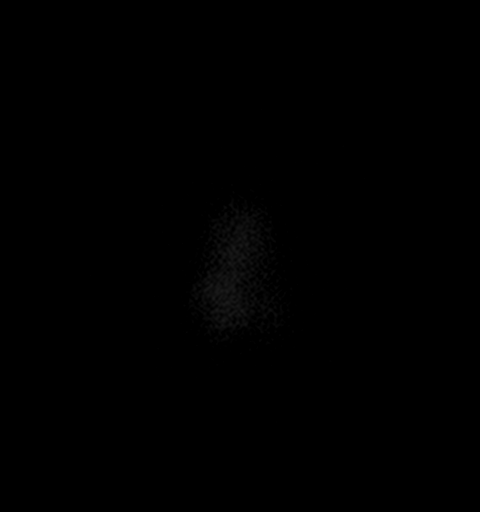

[Series 9: GRE · axial · 3.0mm · 0.45mm/px · z∈[-63,+93]mm · 4 of 53 slices shown]
[im 1/53]
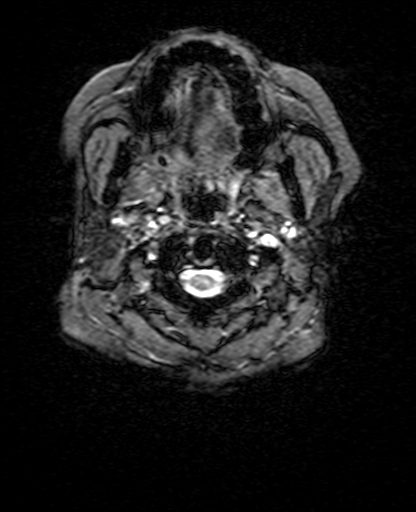
[im 18/53]
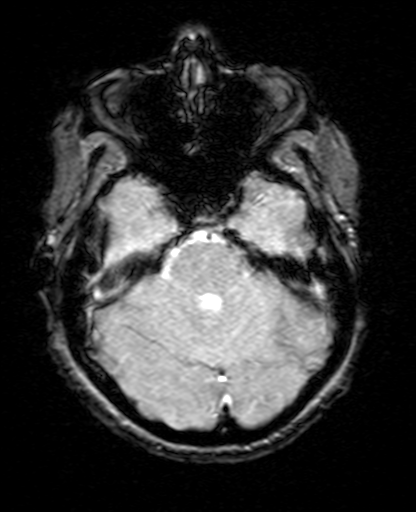
[im 35/53]
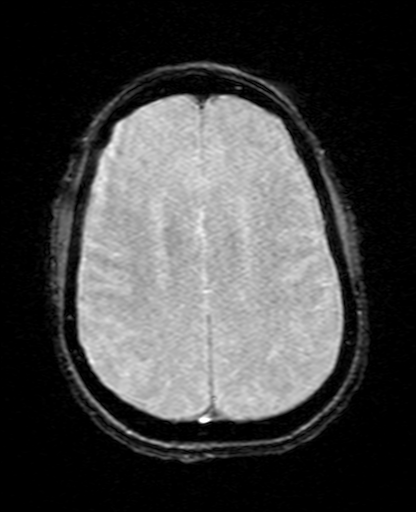
[im 53/53]
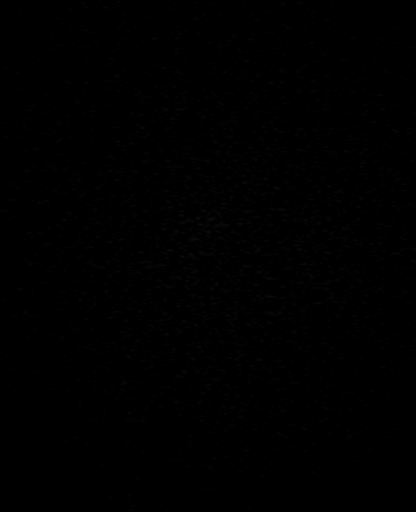

[Series 10: FLAIR · axial · 3.0mm · 0.86mm/px · z∈[-62,+90]mm · 4 of 52 slices shown]
[im 1/52]
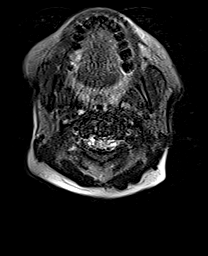
[im 18/52]
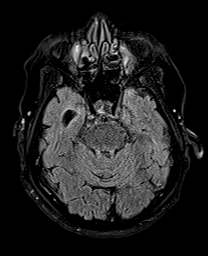
[im 35/52]
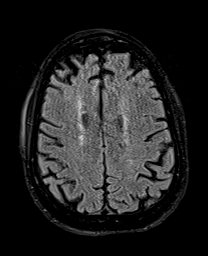
[im 52/52]
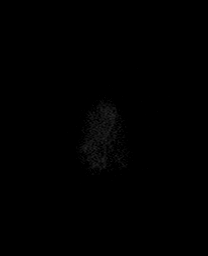

[Series 11: T1 · axial · 3.0mm · 0.45mm/px · z∈[-63,+93]mm · 4 of 53 slices shown (1 of 2)]
[im 1/53]
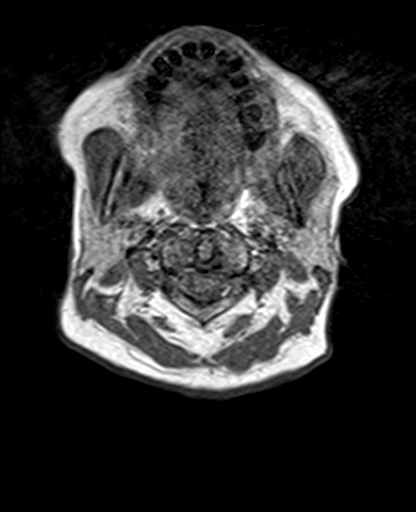
[im 18/53]
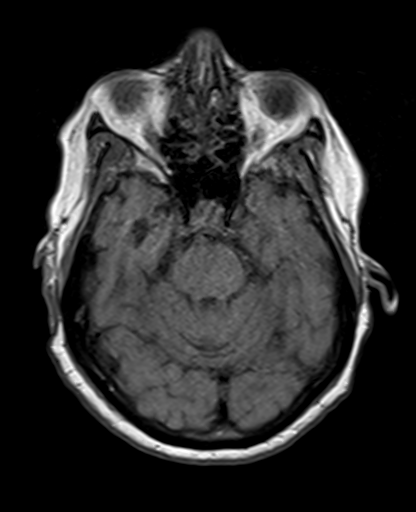
[im 35/53]
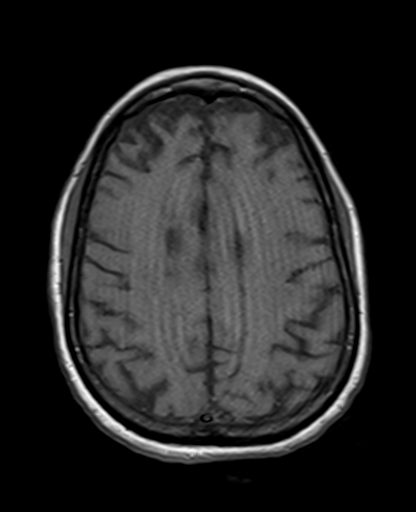
[im 53/53]
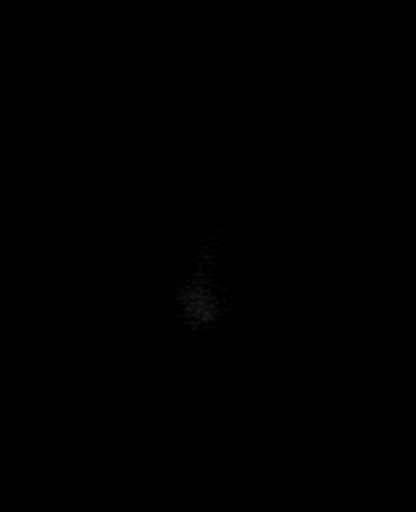

[Series 12: DWI · coronal · 5.0mm · 1.31mm/px · 4 of 56 slices shown (1 of 2)]
[im 1/56]
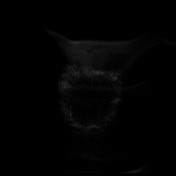
[im 19/56]
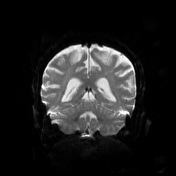
[im 37/56]
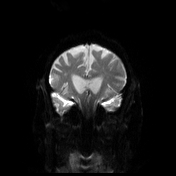
[im 56/56]
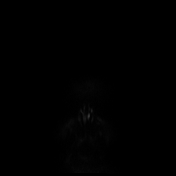

[Series 13: DWI · coronal · 5.0mm · 1.31mm/px · 2 of 28 slices shown (2 of 2)]
[im 1/28]
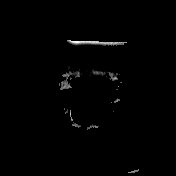
[im 28/28]
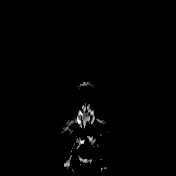

[Series 14: T2 · coronal · 5.0mm · 0.86mm/px · 2 of 29 slices shown (3 of 3)]
[im 1/29]
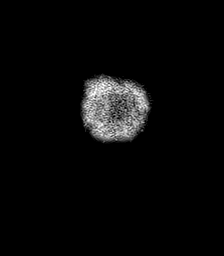
[im 29/29]
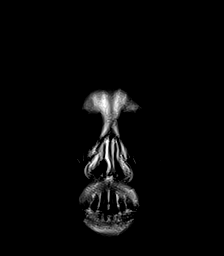

[Series 15: T1 · axial · 1.0mm · 0.94mm/px · z∈[-65,+94]mm · 12 of 160 slices shown (2 of 2)]
[im 1/160]
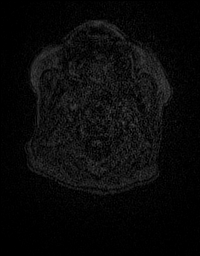
[im 15/160]
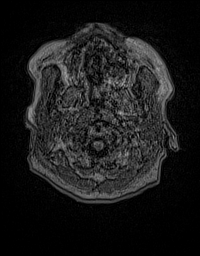
[im 29/160]
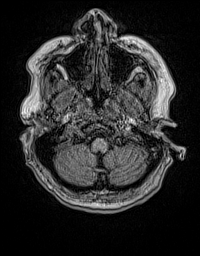
[im 44/160]
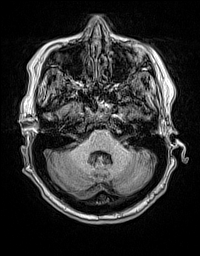
[im 58/160]
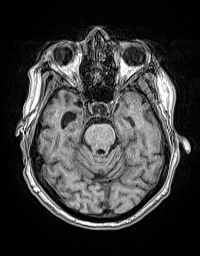
[im 73/160]
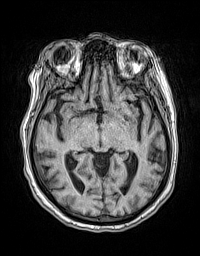
[im 87/160]
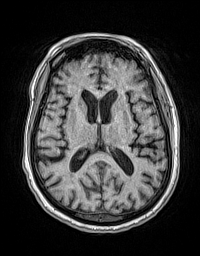
[im 102/160]
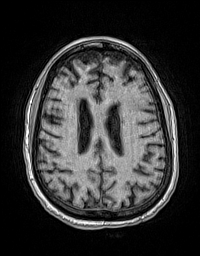
[im 116/160]
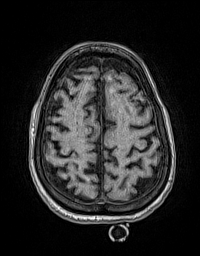
[im 131/160]
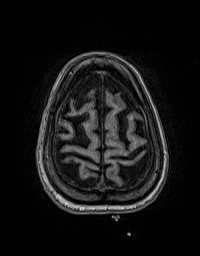
[im 145/160]
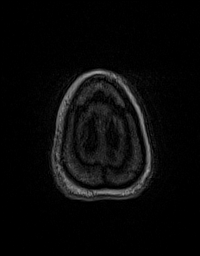
[im 160/160]
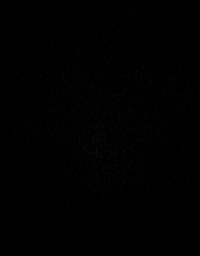

[48 of 48 positions shown; findings below may reference images not displayed]

FINDINGS: Brain: There is no evidence of an acute infarct, intracranial
hemorrhage, mass, midline shift, or extra-axial fluid collection.
There is mild cerebral atrophy with note made of asymmetric volume
loss in the right temporal lobe. T2 hyperintensities in the
periventricular white matter are nonspecific but compatible with
minimal chronic small vessel ischemic disease, not advanced for age.

Vascular: Major intracranial vascular flow voids are preserved.

Skull and upper cervical spine: Unremarkable bone marrow signal.

Sinuses/Orbits: Bilateral cataract extraction. Trace mucosal
thickening in the paranasal sinuses. Small left mastoid effusion.

Other: None.
IMPRESSION: 1. No acute intracranial abnormality.
2. Mild cerebral atrophy.

## 2022-01-07 MED ORDER — ONDANSETRON HCL 4 MG/2ML IJ SOLN: 4.0000 mg | Freq: Four times a day (QID) | INTRAMUSCULAR | Status: DC | PRN

## 2022-01-07 MED ORDER — HEPARIN SODIUM (PORCINE) 5000 UNIT/ML IJ SOLN
5000.0000 [IU] | Freq: Three times a day (TID) | INTRAMUSCULAR | Status: DC
  Administered 2022-01-07 – 2022-01-12 (×14): 5000 [IU] via SUBCUTANEOUS
  Filled 2022-01-07 (×14): qty 1

## 2022-01-07 MED ORDER — SODIUM CHLORIDE 0.9 % IV SOLN: INTRAVENOUS | Status: DC

## 2022-01-07 MED ORDER — INSULIN ASPART 100 UNIT/ML IJ SOLN
0.0000 [IU] | Freq: Three times a day (TID) | INTRAMUSCULAR | Status: DC
  Administered 2022-01-07: 9 [IU] via SUBCUTANEOUS
  Filled 2022-01-07: qty 0.09

## 2022-01-07 MED ORDER — INSULIN ASPART 100 UNIT/ML IJ SOLN
0.0000 [IU] | Freq: Every day | INTRAMUSCULAR | Status: DC
  Administered 2022-01-07: 4 [IU] via SUBCUTANEOUS
  Filled 2022-01-07: qty 0.05

## 2022-01-07 MED ORDER — ONDANSETRON HCL 4 MG PO TABS: 4.0000 mg | ORAL_TABLET | Freq: Four times a day (QID) | ORAL | Status: DC | PRN

## 2022-01-07 MED ORDER — HYDRALAZINE HCL 20 MG/ML IJ SOLN
10.0000 mg | INTRAMUSCULAR | Status: DC | PRN
  Administered 2022-01-07: 10 mg via INTRAVENOUS
  Filled 2022-01-07: qty 1

## 2022-01-07 MED ORDER — CLINDAMYCIN PHOSPHATE 900 MG/50ML IV SOLN
900.0000 mg | Freq: Three times a day (TID) | INTRAVENOUS | Status: AC
  Administered 2022-01-07 – 2022-01-09 (×6): 900 mg via INTRAVENOUS
  Filled 2022-01-07 (×6): qty 50

## 2022-01-07 NOTE — Assessment & Plan Note (Addendum)
Hemoglobin A1c 7.9. Continue sliding scale insulin. Continue Lantus and hold metformin.

## 2022-01-07 NOTE — Assessment & Plan Note (Addendum)
Continue Crestor 

## 2022-01-07 NOTE — ED Notes (Signed)
Patient to MRI.

## 2022-01-07 NOTE — ED Notes (Signed)
Per Dr. Allena Katz, send another sample to lab for CBC.

## 2022-01-07 NOTE — Evaluation (Signed)
SLP Cancellation Note  Patient Details Name: Mackensie Billen MRN: [id number] DOB: 1956/03/13   Cancelled treatment:       Reason Eval/Treat Not Completed: Other (comment) (per ED RN, pt currently at MRI and then to go to 1409, will continue efforts) Kathleen Lime, MS [place] Office [phone] Pager [phone]   Macario Golds 01/07/2022, 2:55 PM

## 2022-01-07 NOTE — Assessment & Plan Note (Addendum)
She is back to her baseline mental status. At baseline patient is pleasantly confused but able to converse coherently. Currently incoherent speech.  Not  able to follow commands. CT of the head unremarkable.  MRI brain also negative for any acute abnormality. Metabolic work-up otherwise unremarkable including ammonia level, B12, TSH.

## 2022-01-07 NOTE — Assessment & Plan Note (Addendum)
Continue Seroquel and Ativan.  Resume home regimen.

## 2022-01-07 NOTE — Progress Notes (Signed)
Progress Note   Patient: Mary Swanson [id number] DOB: 15-Mar-1956 DOA: 01/06/2022     Hospitalization day: 1 DOS: the patient was seen and examined on 01/07/2022   Brief hospital course: 66 year old female with past medical history of Alzheimer's dementia, type II DM, HTN, HLD presented with tachycardia and fever.  Found to have right foot infection. 2/10 admitted for diabetic foot infection. 2/11 blood cultures positive for strep positives.  Mentation improving but not back to baseline.  Assessment and Plan: * Sepsis due to Streptococcus pyogenes due to right foot cellulitis- (present on admission) Meeting SIRS criteria on admission with encephalopathy, tachypnea, tachycardia, fever. Currently sepsis physiology is resolving. Lactic acid is normal. Procalcitonin level 6.57. CRP 25.7. Source of infection right foot cellulitis.  Possibility of osteomyelitis cannot be ruled out. Initially started on IV vancomycin, ceftriaxone and Flagyl. Blood culture came back positive for strep pyogenes. Continue with ceftriaxone and Flagyl for now. We will repeat blood cultures tomorrow. Check MRI foot to rule out osteomyelitis. Continue IV fluid.  AKI (acute kidney injury) ([place])- (present on admission) Baseline serum creatinine 0.67.  On presentation serum creatinine 1.21 elevated BUN as well. Currently receiving IV hydration.  Serum creatinine still elevated.  Monitor.  Alzheimer's dementia with behavioral disturbance ([place])- (present on admission) Patient is on Seroquel and Ativan. For now holding this medication.  If patient becomes agitated may use Ativan as needed.  Acute metabolic encephalopathy- (present on admission) At baseline patient is pleasantly confused but able to converse coherently. Currently incoherent speech.  Very rarely able to follow commands. CT of the head unremarkable. We will get MRI brain to rule out any acute abnormality. Check further encephalopathy work-up  including ammonia level, B12, TSH as well as EEG.  Transaminitis- (present on admission) In the setting of sepsis. Holding statin for now. Monitor.  Essential hypertension- (present on admission) Blood pressure rather elevated. Continue hydralazine as needed.  On Norvasc at home.  Currently on hold.  Will resume tomorrow.  Uncontrolled type 2 diabetes mellitus with hyperglycemia, with long-term current use of insulin with hyperlipidemia Hemoglobin A1c last checked 7 months ago 11.  In chart we will recheck. Currently on sliding scale insulin.  Holding Lantus and metformin.  Hyperlipidemia- (present on admission) Holding statin due to elevated LFTs.  Obesity (BMI 30-39.9)- (present on admission) Body mass index is 38.74 kg/m.  Placing the patient at high risk of poor outcome.     Subjective: Patient with incoherent speech.  Intermittently able to follow commands.  No acute events.  Physical Exam: Vitals:   01/07/22 0930 01/07/22 0945 01/07/22 1000 01/07/22 1320  BP: (!) 149/69 (!) 145/78 (!) 154/116 (!) 156/74  Pulse:   82 88  Resp:   15 20  Temp:      TempSrc:      SpO2:   99% 98%  Weight:      Height:       General: Appear in mild distress; no visible Abnormal Neck Mass Or lumps, Conjunctiva normal Cardiovascular: S1 and S2 Present, no Murmur, Respiratory: good respiratory effort, Bilateral Air entry present and CTA, no Crackles, no wheezes Abdomen: Bowel Sound present Extremities: right trace Pedal edema Neurology: alert and not oriented to time, place, and person Gait not checked due to patient safety concerns   Data Reviewed:  I have Reviewed nursing notes, Vitals, and Lab results since pt's last encounter. Pertinent lab results CMP, ammonia, CK, CRP, procalcitonin, CBC I have ordered test including CBC, CMP I have ordered  imaging studies MRI brain, MRI foot.   Family Communication: Discussed with legal guardian on the phone Mr. Cobb  Disposition: Status  is: Inpatient Remains inpatient appropriate because: Ongoing need for IV antibiotics, ongoing encephalopathy with poor p.o. intake requiring further work-up  Author: Berle Mull, MD 01/07/2022 1:49 PM  For on call review [email or web address].

## 2022-01-07 NOTE — ED Notes (Addendum)
Paged Dr. Posey Pronto regarding hemoglobin of 3.6

## 2022-01-07 NOTE — Assessment & Plan Note (Addendum)
Body mass index is 28.5 kg/m.  Diet recommendation.

## 2022-01-07 NOTE — Assessment & Plan Note (Addendum)
Resolved.  LFTs normalized. In the setting of sepsis.

## 2022-01-07 NOTE — ED Notes (Signed)
Lab advises need redraw on gold top.

## 2022-01-07 NOTE — Assessment & Plan Note (Addendum)
Baseline serum creatinine 0.67.   She presented with 1.21 elevated BUN as well.  Improved with IV hydration.

## 2022-01-07 NOTE — Progress Notes (Signed)
PHARMACY - PHYSICIAN COMMUNICATION CRITICAL VALUE ALERT - BLOOD CULTURE IDENTIFICATION (BCID)  Mary Swanson is an 66 y.o. female who presented to [facility] on 01/06/2022 with a chief complaint of diabetic foot infection  Assessment:  Blood cultures with GPC in chains in 3/4. Strep pyogenes per BCID.   Name of physician (or Provider) Contacted: Dr. Posey Pronto  Current antibiotics: vancomycin/ceftriaxone/metronidazole  Changes to prescribed antibiotics recommended:  D/C vancomycin; ceftriaxone will cover S pyogenes, continue ceftriaxone + flagyl for now for diabetic foot infection/bacteremia. could consider narrowing to penicillin if/when appropriate   Results for orders placed or performed during the hospital encounter of 01/06/22  Blood Culture ID Panel (Reflexed) (Collected: 01/06/2022  1:46 PM)  Result Value Ref Range   Enterococcus faecalis NOT DETECTED NOT DETECTED   Enterococcus Faecium NOT DETECTED NOT DETECTED   Listeria monocytogenes NOT DETECTED NOT DETECTED   Staphylococcus species NOT DETECTED NOT DETECTED   Staphylococcus aureus (BCID) NOT DETECTED NOT DETECTED   Staphylococcus epidermidis NOT DETECTED NOT DETECTED   Staphylococcus lugdunensis NOT DETECTED NOT DETECTED   Streptococcus species DETECTED (A) NOT DETECTED   Streptococcus agalactiae NOT DETECTED NOT DETECTED   Streptococcus pneumoniae NOT DETECTED NOT DETECTED   Streptococcus pyogenes DETECTED (A) NOT DETECTED   A.calcoaceticus-baumannii NOT DETECTED NOT DETECTED   Bacteroides fragilis NOT DETECTED NOT DETECTED   Enterobacterales NOT DETECTED NOT DETECTED   Enterobacter cloacae complex NOT DETECTED NOT DETECTED   Escherichia coli NOT DETECTED NOT DETECTED   Klebsiella aerogenes NOT DETECTED NOT DETECTED   Klebsiella oxytoca NOT DETECTED NOT DETECTED   Klebsiella pneumoniae NOT DETECTED NOT DETECTED   Proteus species NOT DETECTED NOT DETECTED   Salmonella species NOT DETECTED NOT DETECTED   Serratia marcescens  NOT DETECTED NOT DETECTED   Haemophilus influenzae NOT DETECTED NOT DETECTED   Neisseria meningitidis NOT DETECTED NOT DETECTED   Pseudomonas aeruginosa NOT DETECTED NOT DETECTED   Stenotrophomonas maltophilia NOT DETECTED NOT DETECTED   Candida albicans NOT DETECTED NOT DETECTED   Candida auris NOT DETECTED NOT DETECTED   Candida glabrata NOT DETECTED NOT DETECTED   Candida krusei NOT DETECTED NOT DETECTED   Candida parapsilosis NOT DETECTED NOT DETECTED   Candida tropicalis NOT DETECTED NOT DETECTED   Cryptococcus neoformans/gattii NOT DETECTED NOT DETECTED    Napoleon Form 01/07/2022  11:23 AM

## 2022-01-07 NOTE — Hospital Course (Addendum)
This 66 year old female with PMH  significant of Alzheimer's dementia, type II DM, HTN, HLD presented with tachycardia and fever. She is found to have right foot infection.  Patient was admitted for sepsis secondary to right foot cellulitis.  She was initiated on empiric antibiotics.  Podiatry and infectious disease consulted.  Blood cultures positive for streptococcal pyogenes.  MRI completed, no evidence of osteomyelitis.  There was a very small questionable abscess, clinically redness has resolved with IV antibiotics.  Podiatry suggest there is no need for any surgical intervention.  Antibiotics were de-escalated to penicillin G.  Patient felt better redness has resolved back to her baseline mental status.  Infectious disease recommended amoxicillin 500 mg 3 times daily for next 2 weeks.  Patient is being discharged back to memory care unit.

## 2022-01-07 NOTE — Evaluation (Signed)
Clinical/Bedside Swallow Evaluation Patient Details  Name: Mary Swanson MRN: [id number] Date of Birth: 1956-09-17  Today's Date: 01/07/2022 Time: SLP Start Time (ACUTE ONLY): 1525 SLP Stop Time (ACUTE ONLY): 1601 SLP Time Calculation (min) (ACUTE ONLY): 36 min  Past Medical History:  Past Medical History:  Diagnosis Date   Acute lower UTI 06/16/2021   Acute metabolic encephalopathy 12/28/2019   Diabetes mellitus (HCC) 12/28/2019   Diabetes mellitus without complication (HCC)    Hypertension    Memory loss of unknown cause 12/03/2019   Past Surgical History:  Past Surgical History:  Procedure Laterality Date   CATARACT EXTRACTION, BILATERAL Bilateral    HPI:  66 yo female adm to [facility] with severe sepsis suspected to be due to infected diabetic right foot associated with right foot cellulitis.  Pt with PMH + for dementia, DM2.  She was also found to have AKI and dehydration.  Swallow eval ordered.  Pt has a small to moderate HH per prior imaging, stool retained in her colon, and ? atypical infections vs mild pulmonary edema.    Assessment / Plan / Recommendation  Clinical Impression  Pt presents with functional oropharyngeal swallow based on clinical swallow evaluation. Subtle cough x1 of approx 30 swallows observed.  Pt did not pass 3 ounce [place] due to her ceasing intake.   She self fed 4 ounces applesauce, 4 graham crackers, 3 ounces juice, approximately 12 ounces water - self feeding. No oral retention nor indication of dysphagia. Swallow was timely and no indication of retention apparent - as pt without significant multiple swallows.  Recommend regular/thin. SLP Visit Diagnosis: Dysphagia, unspecified (R13.10)    Aspiration Risk       Diet Recommendation Regular;Thin liquid   Liquid Administration via: Cup;Straw Medication Administration: Other (Comment) (as tolerated) Supervision: Patient able to self feed Compensations: Minimize environmental distractions Postural Changes: Seated  upright at 90 degrees    Other  Recommendations   N/a   Recommendations for follow up therapy are one component of a multi-disciplinary discharge planning process, led by the attending physician.  Recommendations may be updated based on patient status, additional functional criteria and insurance authorization.  Follow up Recommendations No SLP follow up      Assistance Recommended at Discharge  N/a  Functional Status Assessment  N/a  Frequency and Duration     No follow       Prognosis   good     Swallow Study   General Date of Onset: 01/07/22 HPI: 66 yo female adm to [facility] with severe sepsis suspected to be due to infected diabetic right foot associated with right foot cellulitis.  Pt with PMH + for dementia, DM2.  She was also found to have AKI and dehydration.  Swallow eval ordered.  Pt has a small to moderate HH per prior imaging, stool retained in her colon, and ? atypical infections vs mild pulmonary edema. Type of Study: Bedside Swallow Evaluation Previous Swallow Assessment: none Diet Prior to this Study: Dysphagia 1 (puree);Thin liquids Temperature Spikes Noted: No Respiratory Status: Room air History of Recent Intubation: No Behavior/Cognition: Alert;Cooperative;Doesn't follow directions Oral Cavity Assessment: Other (comment) (pt did not follow directions consistently but did allow SLP to view oral cavity x1, clear with dentition intact) Oral Care Completed by SLP: No Oral Cavity - Dentition: Adequate natural dentition Vision: Functional for self-feeding Self-Feeding Abilities: Able to feed self Patient Positioning: Upright in bed Baseline Vocal Quality: Normal Volitional Cough: Cognitively unable to elicit Volitional Swallow: Unable to elicit (  due to cognition deficits)    Oral/Motor/Sensory Function Overall Oral Motor/Sensory Function: Within functional limits   Ice Chips Ice chips: Not tested   Thin Liquid Thin Liquid: Within functional limits Presentation:  Straw;Self Fed;Cup    Nectar Thick Nectar Thick Liquid: Within functional limits Presentation: Cup;Straw;Self Fed   Honey Thick Honey Thick Liquid: Not tested   Puree Puree: Within functional limits Presentation: Self Fed;Spoon   Solid     Solid: Within functional limits Presentation: Self Fed;Spoon      Chales Abrahams 01/07/2022,4:36 PM   Rolena Infante, MS [facility] SLP Acute Rehab Services Office [phone] Pager [phone]

## 2022-01-07 NOTE — Assessment & Plan Note (Addendum)
Blood pressure mildly elevated. Continue Norvasc and hydralazine.

## 2022-01-07 NOTE — Assessment & Plan Note (Addendum)
She presented with encephalopathy, tachypnea, tachycardia, fever (met sepsis criteria). Lactic acid is normal. Procalcitonin level 6.57. CRP 25.7. Source of infection right foot cellulitis. Initially started on IV vancomycin, ceftriaxone and Flagyl. Blood culture came back+ for strep pyogenes. MRI negative for osteomyelitis but does show evidence of microabscesses. ID consulted. Appreciate assistance.  Now on IV penicillin. Podiatry consulted, currently recommending close observation and conservative management.,  No indication for surgery at this time. ID recommended 2 weeks of antibiotics (amoxicillin 500 3 times daily until 01/24/22) Podiatry signed off, infectious disease signed off. Sepsis physiology resolved.

## 2022-01-08 ENCOUNTER — Inpatient Hospital Stay (HOSPITAL_COMMUNITY): Payer: Medicare Other

## 2022-01-08 DIAGNOSIS — R7881 Bacteremia: Secondary | ICD-10-CM

## 2022-01-08 DIAGNOSIS — A400 Sepsis due to streptococcus, group A: Secondary | ICD-10-CM | POA: Diagnosis not present

## 2022-01-08 DIAGNOSIS — E11628 Type 2 diabetes mellitus with other skin complications: Secondary | ICD-10-CM

## 2022-01-08 DIAGNOSIS — L089 Local infection of the skin and subcutaneous tissue, unspecified: Secondary | ICD-10-CM

## 2022-01-08 LAB — COMPREHENSIVE METABOLIC PANEL
ALT: 61 U/L — ABNORMAL HIGH (ref 0–44)
AST: 36 U/L (ref 15–41)
Albumin: 2.1 g/dL — ABNORMAL LOW (ref 3.5–5.0)
Alkaline Phosphatase: 132 U/L — ABNORMAL HIGH (ref 38–126)
Anion gap: 6 (ref 5–15)
BUN: 20 mg/dL (ref 8–23)
CO2: 27 mmol/L (ref 22–32)
Calcium: 8.8 mg/dL — ABNORMAL LOW (ref 8.9–10.3)
Chloride: 106 mmol/L (ref 98–111)
Creatinine, Ser: 1.04 mg/dL — ABNORMAL HIGH (ref 0.44–1.00)
GFR, Estimated: 59 mL/min — ABNORMAL LOW (ref >60)
Glucose, Bld: 197 mg/dL — ABNORMAL HIGH (ref 70–99)
Potassium: 3.3 mmol/L — ABNORMAL LOW (ref 3.5–5.1)
Sodium: 139 mmol/L (ref 135–145)
Total Bilirubin: 0.4 mg/dL (ref 0.3–1.2)
Total Protein: 7.5 g/dL (ref 6.5–8.1)

## 2022-01-08 LAB — CBC WITH DIFFERENTIAL/PLATELET
Abs Immature Granulocytes: 0.48 10*3/uL — ABNORMAL HIGH (ref 0.00–0.07)
Basophils Absolute: 0.1 10*3/uL (ref 0.0–0.1)
Basophils Relative: 1 %
Eosinophils Absolute: 0.1 10*3/uL (ref 0.0–0.5)
Eosinophils Relative: 1 %
HCT: 31.8 % — ABNORMAL LOW (ref 36.0–46.0)
Hemoglobin: 9.7 g/dL — ABNORMAL LOW (ref 12.0–15.0)
Immature Granulocytes: 2 %
Lymphocytes Relative: 10 %
Lymphs Abs: 1.9 10*3/uL (ref 0.7–4.0)
MCH: 30.4 pg (ref 26.0–34.0)
MCHC: 30.5 g/dL (ref 30.0–36.0)
MCV: 99.7 fL (ref 80.0–100.0)
Monocytes Absolute: 0.9 10*3/uL (ref 0.1–1.0)
Monocytes Relative: 4 %
Neutro Abs: 16.4 10*3/uL — ABNORMAL HIGH (ref 1.7–7.7)
Neutrophils Relative %: 82 %
Platelets: UNDETERMINED 10*3/uL (ref 150–400)
RBC: 3.19 MIL/uL — ABNORMAL LOW (ref 3.87–5.11)
RDW: 12.2 % (ref 11.5–15.5)
WBC: 19.8 10*3/uL — ABNORMAL HIGH (ref 4.0–10.5)
nRBC: 0.2 % (ref 0.0–0.2)

## 2022-01-08 LAB — ECHOCARDIOGRAM COMPLETE
Area-P 1/2: 4.15 cm2
Calc EF: 61.9 %
Height: 66 in
S' Lateral: 2.5 cm
Single Plane A2C EF: 63.4 %
Single Plane A4C EF: 64 %
Weight: 2804.25 oz

## 2022-01-08 LAB — GLUCOSE, CAPILLARY
Glucose-Capillary: 214 mg/dL — ABNORMAL HIGH (ref 70–99)
Glucose-Capillary: 228 mg/dL — ABNORMAL HIGH (ref 70–99)
Glucose-Capillary: 248 mg/dL — ABNORMAL HIGH (ref 70–99)
Glucose-Capillary: 177 mg/dL — ABNORMAL HIGH (ref 70–99)

## 2022-01-08 LAB — HEMOGLOBIN A1C
Hgb A1c MFr Bld: 7.9 % — ABNORMAL HIGH (ref 4.8–5.6)
Mean Plasma Glucose: 180.03 mg/dL

## 2022-01-08 LAB — MAGNESIUM: Magnesium: 1.9 mg/dL (ref 1.7–2.4)

## 2022-01-08 MED ORDER — THIAMINE HCL 100 MG PO TABS
100.0000 mg | ORAL_TABLET | Freq: Every day | ORAL | Status: DC
  Administered 2022-01-08 – 2022-01-12 (×5): 100 mg via ORAL
  Filled 2022-01-08 (×5): qty 1

## 2022-01-08 MED ORDER — INSULIN ASPART 100 UNIT/ML IJ SOLN
0.0000 [IU] | Freq: Every day | INTRAMUSCULAR | Status: DC
  Administered 2022-01-09: 2 [IU] via SUBCUTANEOUS
  Administered 2022-01-10: 4 [IU] via SUBCUTANEOUS
  Administered 2022-01-11: 2 [IU] via SUBCUTANEOUS

## 2022-01-08 MED ORDER — ROSUVASTATIN CALCIUM 20 MG PO TABS
20.0000 mg | ORAL_TABLET | Freq: Every day | ORAL | Status: DC
  Administered 2022-01-08 – 2022-01-11 (×4): 20 mg via ORAL
  Filled 2022-01-08 (×4): qty 1

## 2022-01-08 MED ORDER — LORAZEPAM 0.5 MG PO TABS
0.5000 mg | ORAL_TABLET | Freq: Two times a day (BID) | ORAL | Status: DC | PRN
  Administered 2022-01-10 – 2022-01-11 (×2): 0.5 mg via ORAL
  Filled 2022-01-08 (×2): qty 1

## 2022-01-08 MED ORDER — INSULIN ASPART 100 UNIT/ML IJ SOLN
0.0000 [IU] | Freq: Three times a day (TID) | INTRAMUSCULAR | Status: DC
  Administered 2022-01-08 (×3): 5 [IU] via SUBCUTANEOUS
  Administered 2022-01-09: 8 [IU] via SUBCUTANEOUS
  Administered 2022-01-09: 2 [IU] via SUBCUTANEOUS
  Administered 2022-01-09 – 2022-01-10 (×2): 5 [IU] via SUBCUTANEOUS
  Administered 2022-01-10 (×2): 8 [IU] via SUBCUTANEOUS
  Administered 2022-01-11 (×2): 11 [IU] via SUBCUTANEOUS
  Administered 2022-01-11: 8 [IU] via SUBCUTANEOUS
  Administered 2022-01-12: 11 [IU] via SUBCUTANEOUS
  Administered 2022-01-12: 5 [IU] via SUBCUTANEOUS

## 2022-01-08 MED ORDER — AMLODIPINE BESYLATE 5 MG PO TABS
5.0000 mg | ORAL_TABLET | Freq: Every day | ORAL | Status: DC
  Administered 2022-01-08 – 2022-01-12 (×5): 5 mg via ORAL
  Filled 2022-01-08 (×5): qty 1

## 2022-01-08 MED ORDER — POTASSIUM CHLORIDE CRYS ER 20 MEQ PO TBCR
40.0000 meq | EXTENDED_RELEASE_TABLET | ORAL | Status: AC
  Administered 2022-01-08 (×2): 40 meq via ORAL
  Filled 2022-01-08 (×2): qty 2

## 2022-01-08 MED ORDER — INSULIN GLARGINE-YFGN 100 UNIT/ML ~~LOC~~ SOLN
12.0000 [IU] | Freq: Every day | SUBCUTANEOUS | Status: DC
  Administered 2022-01-08: 12 [IU] via SUBCUTANEOUS
  Filled 2022-01-08: qty 0.12

## 2022-01-08 MED ORDER — ASPIRIN 81 MG PO CHEW
81.0000 mg | CHEWABLE_TABLET | Freq: Every day | ORAL | Status: DC
  Administered 2022-01-08 – 2022-01-12 (×5): 81 mg via ORAL
  Filled 2022-01-08 (×5): qty 1

## 2022-01-08 MED ORDER — INSULIN GLARGINE 100 UNIT/ML SOLOSTAR PEN: 15.0000 [IU] | PEN_INJECTOR | Freq: Every day | SUBCUTANEOUS | Status: DC

## 2022-01-08 NOTE — Progress Notes (Signed)
°  Echocardiogram 2D Echocardiogram has been performed.  Mary Swanson 01/08/2022, 3:15 PM

## 2022-01-08 NOTE — Progress Notes (Signed)
Progress Note   Patient: Mary Swanson [id number] DOB: 11/26/1956 DOA: 01/06/2022     Hospitalization day: 2 DOS: the patient was seen and examined on 01/08/2022   Brief hospital course: 66 year old female with past medical history of Alzheimer's dementia, type II DM, HTN, HLD presented with tachycardia and fever.  Found to have right foot infection. 2/10 admitted for diabetic foot infection. 2/11 blood cultures positive for strep positives.  Mentation improving but not back to baseline. 2/12 podiatry consulted, ID consulted.  Conservative management recommended.  Assessment and Plan: * Sepsis due to Streptococcus pyogenes due to right foot cellulitis- (present on admission) Meeting SIRS criteria on admission with encephalopathy, tachypnea, tachycardia, fever. Currently sepsis physiology is resolving. Lactic acid is normal. Procalcitonin level 6.57. CRP 25.7. Source of infection right foot cellulitis.  Possibility of osteomyelitis cannot be ruled out. Initially started on IV vancomycin, ceftriaxone and Flagyl. Blood culture came back positive for strep pyogenes. Continue with ceftriaxone and Flagyl for now. MRI negative for osteomyelitis but does show evidence of microabscesses. ID consulted. Appreciate assistance. Podiatry consulted, currently recommending close observation and conservative management.  AKI (acute kidney injury) ([place])- (present on admission) Baseline serum creatinine 0.67.  On presentation serum creatinine 1.21 elevated BUN as well. Currently receiving IV hydration.  Serum creatinine still elevated.  Monitor.  Alzheimer's dementia with behavioral disturbance ([place])- (present on admission) Patient is on Seroquel and Ativan. For now holding this medication. If patient becomes agitated may use Ativan as needed.  Acute metabolic encephalopathy- (present on admission) At baseline patient is pleasantly confused but able to converse coherently. Currently incoherent  speech.  Very rarely able to follow commands. CT of the head unremarkable.  MRI brain also negative for any acute abnormality. Mentation improving. Metabolic work-up otherwise unremarkable including ammonia level, B12, TSH.  Transaminitis- (present on admission) In the setting of sepsis. Holding statin for now. Monitor.  Essential hypertension- (present on admission) Blood pressure rather elevated. Continue hydralazine as needed.  On Norvasc at home. Will resume.  Uncontrolled type 2 diabetes mellitus with hyperglycemia, with long-term current use of insulin with hyperlipidemia Hemoglobin A1c 7.9. Continue sliding scale insulin. Resume Lantus and hold metformin.  Hyperlipidemia- (present on admission) Continue Crestor.  Obesity (BMI 30-39.9)- (present on admission) Body mass index is 28.29 kg/m.  Placing the patient at high risk of poor outcome.        Subjective: No nausea no vomiting no fever no chills.  Mentation improving but still confused.  Physical Exam: Vitals:   01/07/22 2112 01/07/22 2340 01/08/22 0433 01/08/22 1321  BP: (!) 156/70 137/62  (!) 157/63  Pulse: (!) 101 84  88  Resp:  20  18  Temp:  98.4 F (36.9 C)  98.7 F (37.1 C)  TempSrc:  Oral  Oral  SpO2:  94%  93%  Weight:   79.5 kg   Height:       General: Appear in mild distress; no visible Abnormal Neck Mass Or lumps, Conjunctiva normal Cardiovascular: S1 and S2 Present, no Murmur, Respiratory: good respiratory effort, Bilateral Air entry present and CTA, no Crackles, no wheezes Abdomen: Bowel Sound present Extremities: Right pedal edema, with warmth Neurology: alert and not oriented to time, place, and person Gait not checked due to patient safety concerns   Data Reviewed:  I have Reviewed nursing notes, Vitals, and Lab results since pt's last encounter. Pertinent lab results CMP and CBC, CBG I have ordered test including CBC and BMP I have reviewed the last  note from ID and podiatry,  I  have discussed pt's care plan and test results with ID and podiatry.   Family Communication: None at bedside  Disposition: Status is: Inpatient Remains inpatient appropriate because: Still requiring IV antibiotic.  Author: Berle Mull, MD 01/08/2022 6:45 PM  For on call review [email or web address].

## 2022-01-08 NOTE — Progress Notes (Addendum)
Pt's BP at 1925 was 175/79 (MAP 107), so PRN Hydralazine was given. BP dropped to 156/70 (96) @ 2112, then 137/62 @ 2340. Will continue to monitor BP. Gilford Raid, Charity fundraiser

## 2022-01-08 NOTE — Consult Note (Signed)
[facility]                                                                                        Patient Identification: Patient Name: Mary Swanson MRN: [id number] [place] Date: 01/06/2022  1:00 PM Today's Date: 01/08/2022 Reason for consult: Streptococcus pyogenes bacteremia Requesting provider: [facility] consult   Principal Problem:   Sepsis due to Streptococcus pyogenes due to right foot cellulitis Active Problems:   Hyperlipidemia   Obesity (BMI 30-39.9)   AKI (acute kidney injury) ([place])   Dehydration   Acute metabolic encephalopathy   Essential hypertension   Alzheimer's dementia with behavioral disturbance ([place])   Diabetic foot infection ([place])   Transaminitis   Uncontrolled type 2 diabetes mellitus with hyperglycemia, with long-term current use of insulin with hyperlipidemia   Antibiotics:  Vancomycin/cefepime 2/10                     Ceftriaxone 2/10-                     Clindamycin 2/11-                     Metronidazole 2/10-  Lines/Hardware:  Assessment 66 year old female with PMH of T2DM ( a1c 7.9), HTN, dementia who presented to the ED on 2/10 for altered mental status and sepsis. Found to have Strep pyogenes bacteremia likely 2/2 Rt foot diabetic infection   Recommendations  Continue ceftriaxone pending sensi of strep pyogenes. Dc metronidazole Complete 48 hrs total of clindamycin for antitoxin effect  Repeat blood cultures * 2  TTE Per podiatry, no immediate plans for I and D and conservative management with IV abtx followed by reassessment in 24-48 hrs Monitor CBC and CMP Discussed with Primary and Podiatry  Dr Linus Salmons to follow from tomorrow.   Rest of the management as per the primary team. Please call with questions or concerns.  Thank you for the consult  Rosiland Oz, MD Infectious Disease Physician [facility] [address] [place], [place] [address] Phone: [phone]   Fax: [phone]  __________________________________________________________________________________________________________ HPI and Hospital Course: 66 year old female with PMH of T2DM, HTN, dementia who presented to the ED on 2/10 for altered mental status and sepsis.  Patient came in from [place] and was nonresponsive to commands.   At ED Tmax 103.8 and tachycardic.  WBC 31.4.  Lactic acid 1.9.  CRP 25.7.  ESR 35 right lower extremity warm/erythema Started on broad-spectrum antibiotics with vancomycin, cefepime and metronidazole Labs remarkable for AKI with creatinine 1.21, ALP 169 AST 134 ALT 122 TB 0.8 Blood cultures 2/10 4 bottles positive for GPC.  BC ID strep pyogenes  Chest x-ray no acute abnormality Right foot x-ray no acute changes CT chest abdomen pelvis with bilateral lung apices changes may represent developing atypical infection versus mild pulmonary edema..  Stool throughout the colon.  CT head no acute abnormality MRI brain with no acute abnormality  MRI right foot no evidence of acute osteomyelitis/circumferential soft tissue edema, small areas of slightly more confluent  fluid within the plantar medial soft tissues underlying the first metatarsal concerning for small developing abscesses.  Trace tenosynovitis of the flexor hallucis longus tendon at the level of the great toe proximal phalanx.  Diffuse edema-like signal throughout the foot musculature may represent changes related to denervation or myositis.  ROS: unreliable due to dementia Pain in the rt foot +  Past Medical History:  Diagnosis Date   Acute lower UTI 0/30/0923   Acute metabolic encephalopathy 3/00/7622   Diabetes mellitus ([place]) 12/28/2019   Diabetes mellitus without complication ([place])    Hypertension    Memory loss of unknown cause 12/03/2019   Past Surgical History:  Procedure Laterality Date   CATARACT EXTRACTION,  BILATERAL Bilateral     Scheduled Meds:  heparin injection (subcutaneous)  5,000 Units Subcutaneous Q8H   insulin aspart  0-5 Units Subcutaneous QHS   insulin aspart  0-9 Units Subcutaneous TID WC   Continuous Infusions:  sodium chloride 100 mL/hr at 01/07/22 1618   cefTRIAXone (ROCEPHIN)  IV 2 g (01/07/22 2143)   clindamycin (CLEOCIN) IV 900 mg (01/08/22 0522)   metronidazole 500 mg (01/08/22 0024)   PRN Meds:.acetaminophen **OR** acetaminophen, hydrALAZINE, ondansetron **OR** ondansetron (ZOFRAN) IV  Allergies  Allergen Reactions   Erythromycin Diarrhea   Latex Hives   Lisinopril Cough   Social History   Socioeconomic History   Marital status: Single    Spouse name: Not on file   Number of children: Not on file   Years of education: Not on file   Highest education level: Not on file  Occupational History   Not on file  Tobacco Use   Smoking status: Never   Smokeless tobacco: Never  Vaping Use   Vaping Use: Never used  Substance and Sexual Activity   Alcohol use: Not Currently   Drug use: Never   Sexual activity: Not on file  Other Topics Concern   Not on file  Social History Narrative   Not on file   Social Determinants of Health   Financial Resource Strain: Not on file  Food Insecurity: Not on file  Transportation Needs: Not on file  Physical Activity: Not on file  Stress: Not on file  Social Connections: Not on file  Intimate Partner Violence: Not on file   Family History  Problem Relation Age of Onset   Diabetes Neg Hx     Vitals  BP 137/62 (BP Location: Left Arm)    Pulse 84    Temp 98.4 F (36.9 C) (Oral)    Resp 20    Ht _0  (1.676 m)    Wt 79.5 kg    LMP  (LMP Unknown)    SpO2 94%    BMI 28.29 kg/m   Physical Exam Constitutional:  sitting up in the bed and eating lunch     Comments:   Cardiovascular:     Rate and Rhythm: Normal rate and regular rhythm.     Heart sounds:   Pulmonary:     Effort: Pulmonary effort is normal.      Comments: on room air, clear breath sounds   Abdominal:     Palpations: Abdomen is soft.     Tenderness: non tender and non distended   Musculoskeletal:        General: RT foot is swollen and tender/warm       Skin:    Comments:   Neurological:     General: grossly non focal   Psychiatric:  Mood and Affect: Pleasantly confused   Pertinent Microbiology Results for orders placed or performed during the hospital encounter of 01/06/22  Blood Culture (routine x 2)     Status: None (Preliminary result)   Collection Time: 01/06/22  1:41 PM   Specimen: BLOOD  Result Value Ref Range Status   Specimen Description   Final    BLOOD RIGHT ANTECUBITAL Performed at [facility] [address], [place], [place] [address]    Special Requests   Final    BOTTLES DRAWN AEROBIC AND ANAEROBIC Blood Culture results may not be optimal due to an inadequate volume of blood received in culture bottles Performed at [facility] [address], [place], [place] [address]    Culture  Setup Time   Final    GRAM POSITIVE COCCI AEROBIC BOTTLE ONLY CRITICAL VALUE NOTED.  VALUE IS CONSISTENT WITH PREVIOUSLY REPORTED AND CALLED VALUE. Performed at [facility], [place] [address] [place], [place] [address]    Culture GRAM POSITIVE COCCI  Final   Report Status PENDING  Incomplete  Urine Culture     Status: None   Collection Time: 01/06/22  1:41 PM   Specimen: In/Out Cath Urine  Result Value Ref Range Status   Specimen Description   Final    IN/OUT CATH URINE Performed at [facility], [place] [address], [place], [place] [address]    Special Requests   Final    NONE Performed at [facility], [place] [address], [place], [place] [address]    Culture   Final    NO GROWTH Performed at [facility], [place] [address], [place], [place] [address]    Report Status 01/07/2022 FINAL  Final  Blood Culture (routine x 2)      Status: None (Preliminary result)   Collection Time: 01/06/22  1:46 PM   Specimen: BLOOD  Result Value Ref Range Status   Specimen Description   Final    BLOOD RIGHT ANTECUBITAL Performed at [facility] [address], [place], [place] [address]    Special Requests   Final    BOTTLES DRAWN AEROBIC AND ANAEROBIC Blood Culture results may not be optimal due to an inadequate volume of blood received in culture bottles Performed at [facility] [address] [place], [place] [address]    Culture  Setup Time   Final    GRAM POSITIVE COCCI IN BOTH AEROBIC AND ANAEROBIC BOTTLES CRITICAL RESULT CALLED TO, READ BACK BY AND VERIFIED WITH: Vena [place] S.CHRISTY ON [id number] AT 1100 BY E.PARRISH Performed at [facility], [place] [address] [place],  [address]    Culture GRAM POSITIVE COCCI  Final   Report Status PENDING  Incomplete  Blood Culture ID Panel (Reflexed)     Status: Abnormal   Collection Time: 01/06/22  1:46 PM  Result Value Ref Range Status   Enterococcus faecalis NOT DETECTED NOT DETECTED Final   Enterococcus Faecium NOT DETECTED NOT DETECTED Final   Listeria monocytogenes NOT DETECTED NOT DETECTED Final   Staphylococcus species NOT DETECTED NOT DETECTED Final   Staphylococcus aureus (BCID) NOT DETECTED NOT DETECTED Final   Staphylococcus epidermidis NOT DETECTED NOT DETECTED Final   Staphylococcus lugdunensis NOT DETECTED NOT DETECTED Final   Streptococcus species DETECTED (A) NOT DETECTED Final    Comment: CRITICAL RESULT CALLED TO, READ BACK BY AND VERIFIED WITH: PHARM D S.CHRISTY ON [id number] AT 1100 BY E.PARRISH  Streptococcus agalactiae NOT DETECTED NOT DETECTED Final   Streptococcus pneumoniae NOT DETECTED NOT DETECTED Final   Streptococcus pyogenes DETECTED (A) NOT DETECTED Final    Comment: CRITICAL RESULT CALLED TO, READ BACK BY AND VERIFIED WITH: PHARM D S.CHRISTY ON [id number] AT 1100 BY E.PARRISH     A.calcoaceticus-baumannii NOT DETECTED NOT DETECTED Final   Bacteroides fragilis NOT DETECTED NOT DETECTED Final   Enterobacterales NOT DETECTED NOT DETECTED Final   Enterobacter cloacae complex NOT DETECTED NOT DETECTED Final   Escherichia coli NOT DETECTED NOT DETECTED Final   Klebsiella aerogenes NOT DETECTED NOT DETECTED Final   Klebsiella oxytoca NOT DETECTED NOT DETECTED Final   Klebsiella pneumoniae NOT DETECTED NOT DETECTED Final   Proteus species NOT DETECTED NOT DETECTED Final   Salmonella species NOT DETECTED NOT DETECTED Final   Serratia marcescens NOT DETECTED NOT DETECTED Final   Haemophilus influenzae NOT DETECTED NOT DETECTED Final   Neisseria meningitidis NOT DETECTED NOT DETECTED Final   Pseudomonas aeruginosa NOT DETECTED NOT DETECTED Final   Stenotrophomonas maltophilia NOT DETECTED NOT DETECTED Final   Candida albicans NOT DETECTED NOT DETECTED Final   Candida auris NOT DETECTED NOT DETECTED Final   Candida glabrata NOT DETECTED NOT DETECTED Final   Candida krusei NOT DETECTED NOT DETECTED Final   Candida parapsilosis NOT DETECTED NOT DETECTED Final   Candida tropicalis NOT DETECTED NOT DETECTED Final   Cryptococcus neoformans/gattii NOT DETECTED NOT DETECTED Final    Comment: Performed at [facility], [address], [place], [place] [address]  Resp Panel by RT-PCR (Flu A&B, Covid) Nasopharyngeal Swab     Status: None   Collection Time: 01/06/22  2:09 PM   Specimen: Nasopharyngeal Swab; Nasopharyngeal(NP) swabs in vial transport medium  Result Value Ref Range Status   SARS Coronavirus 2 by RT PCR NEGATIVE NEGATIVE Final    Comment: (NOTE) SARS-CoV-2 target nucleic acids are NOT DETECTED.  The SARS-CoV-2 RNA is generally detectable in upper respiratory specimens during the acute phase of infection. The lowest concentration of SARS-CoV-2 viral copies this assay can detect is 138 copies/mL. A negative result does not preclude SARS-Cov-2 infection and  should not be used as the sole basis for treatment or other patient management decisions. A negative result may occur with  improper specimen collection/handling, submission of specimen other than nasopharyngeal swab, presence of viral mutation(s) within the areas targeted by this assay, and inadequate number of viral copies(<138 copies/mL). A negative result must be combined with clinical observations, patient history, and epidemiological information. The expected result is Negative.  Fact Sheet for Patients:  EntrepreneurPulse.com.au  Fact Sheet for Healthcare Providers:  IncredibleEmployment.be  This test is no t yet approved or cleared by the [place] FDA and  has been authorized for detection and/or diagnosis of SARS-CoV-2 by FDA under an Emergency Use Authorization (EUA). This EUA will remain  in effect (meaning this test can be used) for the duration of the COVID-19 declaration under Section 564(b)(1) of the Act, 21 U.S.C.section 360bbb-3(b)(1), unless the authorization is terminated  or revoked sooner.       Influenza A by PCR NEGATIVE NEGATIVE Final   Influenza B by PCR NEGATIVE NEGATIVE Final    Comment: (NOTE) The Xpert Xpress SARS-CoV-2/FLU/RSV plus assay is intended as an aid in the diagnosis of influenza from Nasopharyngeal swab specimens and should not be used as a sole basis for treatment. Nasal washings and aspirates are unacceptable for Xpert Xpress SARS-CoV-2/FLU/RSV testing.  Fact Sheet for Patients: EntrepreneurPulse.com.au  Fact Sheet for Healthcare Providers: IncredibleEmployment.be  This test is not yet approved or cleared by the [place] FDA and has been authorized for detection and/or diagnosis of SARS-CoV-2 by FDA under an Emergency Use Authorization (EUA). This EUA will remain in effect (meaning this test can be used) for the duration of the COVID-19 declaration  under Section 564(b)(1) of the Act, 21 U.S.C. section 360bbb-3(b)(1), unless the authorization is terminated or revoked.  Performed at [facility], [place] [address] [place], [place] [address]     Pertinent Lab seen by me: CBC Latest Ref Rng & Units 01/08/2022 01/07/2022 01/07/2022  WBC 4.0 - 10.5 K/uL 19.8(H) 19.3(H) QUESTIONABLE RESULT/NEW SPEC REQ  Hemoglobin 12.0 - 15.0 g/dL 9.7(L) 10.3(L) QUESTIONABLE RESULT/NEW SPEC REQ  Hematocrit 36.0 - 46.0 % 31.8(L) 33.2(L) QUESTIONABLE RESULT/NEW SPEC REQ  Platelets 150 - 400 K/uL PLATELET CLUMPS NOTED ON SMEAR, UNABLE TO ESTIMATE 259 QUESTIONABLE RESULT/NEW SPEC REQ   CMP Latest Ref Rng & Units 01/08/2022 01/07/2022 01/06/2022  Glucose 70 - 99 mg/dL 197(H) 113(H) 293(H)  BUN 8 - 23 mg/dL 20 22 27(H)  Creatinine 0.44 - 1.00 mg/dL 1.04(H) 1.22(H) 1.21(H)  Sodium 135 - 145 mmol/L 139 147(H) 144  Potassium 3.5 - 5.1 mmol/L 3.3(L) 3.7 4.2  Chloride 98 - 111 mmol/L 106 112(H) 109  CO2 22 - 32 mmol/L _0 Calcium 8.9 - 10.3 mg/dL 8.8(L) 9.6 9.4  Total Protein 6.5 - 8.1 g/dL 7.5 7.9 7.9  Total Bilirubin 0.3 - 1.2 mg/dL 0.4 0.7 0.8  Alkaline Phos 38 - 126 U/L 132(H) 139(H) 169(H)  AST 15 - 41 U/L 36 57(H) 134(H)  ALT 0 - 44 U/L 61(H) 85(H) 122(H)     Pertinent Imagings/Other Imagings Plain films and CT images have been personally visualized and interpreted; radiology reports have been reviewed. Decision making incorporated into the Impression / Recommendations.  CT Head Wo Contrast  Result Date: 01/06/2022 CLINICAL DATA:  [place]ium EXAM: CT HEAD WITHOUT CONTRAST TECHNIQUE: Contiguous axial images were obtained from the base of the skull through the vertex without intravenous contrast. RADIATION DOSE REDUCTION: This exam was performed according to the departmental dose-optimization program which includes automated exposure control, adjustment of the mA and/or kV according to patient size and/or use of iterative reconstruction  technique. COMPARISON:  CT head 06/15/2021 BRAIN: BRAIN Cerebral ventricle sizes are concordant with the degree of cerebral volume loss. Mild patchy and confluent areas of decreased attenuation are noted throughout the deep and periventricular white matter of the cerebral hemispheres bilaterally, compatible with chronic microvascular ischemic disease. No evidence of large-territorial acute infarction. No parenchymal hemorrhage. No mass lesion. No extra-axial collection. No mass effect or midline shift. No hydrocephalus. Basilar cisterns are patent. Vascular: No hyperdense vessel. Atherosclerotic calcifications are present within the cavernous internal carotid arteries. Skull: No acute fracture or focal lesion. Sinuses/Orbits: Frothy mucosal thickening within the bilateral sphenoid sinuses and right ethmoid sinus paranasal sinuses and mastoid air cells are clear. Bilateral lens replacement. Otherwise the orbits are unremarkable. Other: None. IMPRESSION: No acute intracranial abnormality. Electronically Signed   By: Iven Finn M.D.   On: 01/06/2022 17:20   MR BRAIN WO CONTRAST  Result Date: 01/07/2022 CLINICAL DATA:  Mental status change, unknown cause. EXAM: MRI HEAD WITHOUT CONTRAST TECHNIQUE: Multiplanar, multiecho pulse sequences of the brain and surrounding structures were obtained without intravenous contrast. COMPARISON:  Head CT 01/06/2022 and MRI 12/28/2019 FINDINGS: Brain: There is no evidence of an acute infarct, intracranial hemorrhage, mass,  midline shift, or extra-axial fluid collection. There is mild cerebral atrophy with note made of asymmetric volume loss in the right temporal lobe. T2 hyperintensities in the periventricular white matter are nonspecific but compatible with minimal chronic small vessel ischemic disease, not advanced for age. Vascular: Major intracranial vascular flow voids are preserved. Skull and upper cervical spine: Unremarkable bone marrow signal. Sinuses/Orbits: Bilateral  cataract extraction. Trace mucosal thickening in the paranasal sinuses. Small left mastoid effusion. Other: None. IMPRESSION: 1. No acute intracranial abnormality. 2. Mild cerebral atrophy. Electronically Signed   By: Logan Bores M.D.   On: 01/07/2022 14:13   MR FOOT RIGHT WO CONTRAST  Result Date: 01/07/2022 CLINICAL DATA:  Foot swelling, nondiabetic, osteomyelitis suspected EXAM: MRI OF THE RIGHT FOREFOOT WITHOUT CONTRAST TECHNIQUE: Multiplanar, multisequence MR imaging of the right forefoot was performed. No intravenous contrast was administered. COMPARISON:  X-ray 01/06/2022 FINDINGS: Bones/Joint/Cartilage No acute fracture. No dislocation. No focal bone erosion or marrow replacement. No bone marrow edema. Mild arthropathy is most pronounced at the first MTP joint. No effusion. No bone lesion. Ligaments Intact Lisfranc ligament. Collateral ligaments of the forefoot are intact. Muscles and Tendons Diffuse edema-like signal throughout the foot musculature may represent changes related to denervation or myositis. Intact flexor and extensor tendons. Trace tenosynovitis of the flexor hallucis longus tendon at the level of the great toe proximal phalanx. No tendinous disruption. Soft tissues Circumferential soft tissue edema. Small areas of slightly more confluent fluid signal including at the plantar-medial aspect of the foot at the level of the first metatarsal neck measuring 14 x 7 x 13 mm (series 5, image 21). Additional superficial collection at the plantar aspect of the forefoot at the level of the first metatarsal diaphysis measuring 15 x 4 x 7 mm (series 5, image 23). No discrete ulceration IMPRESSION: 1. No evidence of acute osteomyelitis of the right forefoot. 2. Circumferential soft tissue edema. Small areas of slightly more confluent fluid within the plantar-medial soft tissues underlying the first metatarsal concerning for small developing abscesses. 3. Trace tenosynovitis of the flexor hallucis  longus tendon at the level of the great toe proximal phalanx. 4. Diffuse edema-like signal throughout the foot musculature may represent changes related to denervation or myositis. Electronically Signed   By: Davina Poke D.O.   On: 01/07/2022 16:19   CT CHEST ABDOMEN PELVIS W CONTRAST  Result Date: 01/06/2022 CLINICAL DATA:  Altered mental status.  Sepsis. EXAM: CT CHEST, ABDOMEN, AND PELVIS WITH CONTRAST TECHNIQUE: Multidetector CT imaging of the chest, abdomen and pelvis was performed following the standard protocol during bolus administration of intravenous contrast. RADIATION DOSE REDUCTION: This exam was performed according to the departmental dose-optimization program which includes automated exposure control, adjustment of the mA and/or kV according to patient size and/or use of iterative reconstruction technique. CONTRAST:  103m OMNIPAQUE IOHEXOL 300 MG/ML  SOLN COMPARISON:  None. FINDINGS: CT CHEST FINDINGS Cardiovascular: Normal heart size. No significant pericardial effusion. The thoracic aorta is normal in caliber. Mild atherosclerotic plaque of the thoracic aorta. No coronary artery calcifications. Mediastinum/Nodes: No enlarged mediastinal, hilar, or axillary lymph nodes. Thyroid gland, trachea, and esophagus demonstrate no significant findings. Small to moderate volume hiatal hernia. Right paratracheal/precarinal conglomerative lymph node measuring up to 1.3 cm (2:22). Lungs/Pleura: Bilateral lower lobe subsegmental atelectasis. Mild biapical interlobular septal wall thickening. Query scattered tree-in-bud nodularity along the bilateral apices. No focal consolidation. No pulmonary nodule. No pulmonary mass. No pleural effusion. No pneumothorax. Musculoskeletal: No chest wall abnormality. Bilateral peripherally calcified  breast implants. No suspicious lytic or blastic osseous lesions. No acute displaced fracture. CT ABDOMEN PELVIS FINDINGS Hepatobiliary: No focal liver abnormality. No  gallstones, gallbladder wall thickening, or pericholecystic fluid. No biliary dilatation. Pancreas: No focal lesion. Normal pancreatic contour. No surrounding inflammatory changes. No main pancreatic ductal dilatation. Spleen: Normal in size without focal abnormality. Adrenals/Urinary Tract: No adrenal nodule bilaterally. Bilateral kidneys enhance symmetrically. Subcentimeter hypodensities too small to characterize. No hydronephrosis. No hydroureter. The urinary bladder is unremarkable. On delayed imaging, there is no urothelial wall thickening and there are no filling defects in the opacified portions of the bilateral collecting systems or ureters. Stomach/Bowel: Stomach is within normal limits. No evidence of bowel wall thickening or dilatation. Stool throughout the colon. Appendix appears normal. Vascular/Lymphatic: No abdominal aorta or iliac aneurysm. Mild atherosclerotic plaque of the aorta and its branches. Prominent but nonenlarged right inguinal lymph node measuring up to 1.4 cm. No abdominal, pelvic, or inguinal lymphadenopathy. Reproductive: Uterus and bilateral adnexa are unremarkable. Other: No intraperitoneal free fluid. No intraperitoneal free gas. No organized fluid collection. Musculoskeletal: Small fat containing umbilical hernia. No suspicious lytic or blastic osseous lesions. No acute displaced fracture. IMPRESSION: 1. Vague bilateral lung apices changes may represent developing atypical infection versus mild pulmonary edema. 2. Nonspecific right paratracheal/precarinal enlarged lymph node. 3. Small to moderate volume hiatal hernia. 4. Stool throughout the colon.  Correlate for constipation. 5. No acute intra-abdominal or intrapelvic abnormality. 6.  Aortic Atherosclerosis (ICD10-I70.0). Electronically Signed   By: Iven Finn M.D.   On: 01/06/2022 17:16   DG Chest Port 1 View  Result Date: 01/06/2022 CLINICAL DATA:  A 66 year old fee female presents for evaluation of potential sepsis.  EXAM: PORTABLE CHEST 1 VIEW COMPARISON:  June 15, 2021. FINDINGS: EKG leads project over the chest. Cardiomediastinal contours and hilar structures are stable. No lobar consolidation.  No effusion or visible pneumothorax. Partially calcified breast implants project over the chest as before. On limited assessment there is no acute skeletal process. IMPRESSION: No acute cardiopulmonary process. Electronically Signed   By: Zetta Bills M.D.   On: 01/06/2022 14:29   DG Foot Complete Right  Result Date: 01/06/2022 CLINICAL DATA:  Sepsis, foot wound EXAM: RIGHT FOOT COMPLETE - 3+ VIEW COMPARISON:  None. FINDINGS: There is no evidence of fracture or dislocation. There is no evidence of arthropathy or other focal bone abnormality. Subcutaneus soft tissue edema of the forefoot/midfoot dorsum. Vascular calcifications. IMPRESSION: Negative. Electronically Signed   By: Iven Finn M.D.   On: 01/06/2022 18:19    I spent 81 minutes for this patient encounter including review of prior medical records/discussing diagnostics and treatment plan with the patient/family/coordinate care with primary/other specialits with greater than 50% of time in face to face encounter.   Electronically signed by:   Rosiland Oz, MD Infectious Disease Physician [facility] Pager: [phone]

## 2022-01-08 NOTE — Consult Note (Signed)
PODIATRY CONSULTATION  NAME Mary Swanson MRN [id number] DOB 1956-11-12 DOA 01/06/2022   Reason for consult: Abscess/cellulitis RT foot Chief Complaint  Patient presents with   Altered Mental Status   Code Sepsis    Consulting physician: Berle Mull, MD  History of present illness: 66 y.o. female very pleasant PMHx advanced dementia, diabetes mellitus type 2, HTN, admitted for severe sepsis secondary to right foot infection.  Patient came from SNF. Vital signs in the ED were notable for the following: Temperature max 103.8; initial heart rate 124, which is decreased to 96 following interval initiation of IV fluids; blood pressure 150/66 - 165/71 mmHg; respiratory rate 17-27, oxygen saturation 93 to 99% on room air. Upon admission x-rays and MRI was completed.  Podiatry and infectious disease consulted today.  Patient resting comfortably in bed this afternoon upon evaluation  Past Medical History:  Diagnosis Date   Acute lower UTI [id number]   Acute metabolic encephalopathy [id number]   Diabetes mellitus ([place]) 12/28/2019   Diabetes mellitus without complication ([place])    Hypertension    Memory loss of unknown cause 12/03/2019    CBC Latest Ref Rng & Units 01/08/2022 01/07/2022 01/07/2022  WBC 4.0 - 10.5 K/uL 19.8(H) 19.3(H) QUESTIONABLE RESULT/NEW SPEC REQ  Hemoglobin 12.0 - 15.0 g/dL 9.7(L) 10.3(L) QUESTIONABLE RESULT/NEW SPEC REQ  Hematocrit 36.0 - 46.0 % 31.8(L) 33.2(L) QUESTIONABLE RESULT/NEW SPEC REQ  Platelets 150 - 400 K/uL PLATELET CLUMPS NOTED ON SMEAR, UNABLE TO ESTIMATE 259 QUESTIONABLE RESULT/NEW SPEC REQ    BMP Latest Ref Rng & Units 01/08/2022 01/07/2022 01/06/2022  Glucose 70 - 99 mg/dL 197(H) 113(H) 293(H)  BUN 8 - 23 mg/dL 20 22 27(H)  Creatinine 0.44 - 1.00 mg/dL 1.04(H) 1.22(H) 1.21(H)  BUN/Creat Ratio 12 - 28 - - -  Sodium 135 - 145 mmol/L 139 147(H) 144  Potassium 3.5 - 5.1 mmol/L 3.3(L) 3.7 4.2  Chloride 98 - 111 mmol/L 106 112(H) 109  CO2 22 - 32 mmol/L 27 27  27   Calcium 8.9 - 10.3 mg/dL 8.8(L) 9.6 9.4      Physical Exam: General: The patient is alert and oriented x3 in no acute distress.   Dermatology: No open wounds noted. Hyperkeratotic callus noted to the plantar 5th MTP joint, however there is no open wound. No interdigital maceration.  Vascular: Palpable pedal pulses bilaterally. Erythema and edema noted most prevalent around the first ray and medial aspect of the forefoot.  Neurological: Epicritic and protective threshold diminished   Musculoskeletal Exam: No structural deformity noted. There is tenderness to palpation of the forefoot. No crepitus.   XR FOOT RT 01/06/2022: FINDINGS: There is no evidence of fracture or dislocation. There is no evidence of arthropathy or other focal bone abnormality. Subcutaneus soft tissue edema of the forefoot/midfoot dorsum. Vascular calcifications.   IMPRESSION: Negative.  MR FOOT RT WO CONTRAST 01/07/2022: IMPRESSION: 1. No evidence of acute osteomyelitis of the right forefoot. 2. Circumferential soft tissue edema. Small areas of slightly more confluent fluid within the plantar-medial soft tissues underlying the first metatarsal concerning for small developing abscesses. 3. Trace tenosynovitis of the flexor hallucis longus tendon at the level of the great toe proximal phalanx. 4. Diffuse edema-like signal throughout the foot musculature may represent changes related to denervation or myositis.  ASSESSMENT/PLAN OF CARE 1.  Sepsis secondary to right foot infection -MRI was reviewed.  At the moment there is no open wound and there are very small questionable abscesses developing along the first MTP plantar aspect. -I discussed  the patient and findings with both the hospitalist and infectious disease and for now we are going to monitor the patient over the next 24-48 hours to see if there is significant improvement prior to taking the patient to the OR for I&D.  If there is no improvement  then the patient will need incision and drainage.  We will need to gain consent from the patient's legal guardian prior. -Continue IV clindamycin 900 mg every 8 hours and Rocephin 2 g every 24 hours as per ID -Will continue to monitor for now.  If there is no improvement over the next 24-48 hours we will need to proceed with I&D of the foot   Thank you for the consult.    Edrick Kins, DPM [facility]  Dr. Edrick Kins, DPM    [address], [place] [address]                Office [phone]  Fax [phone]

## 2022-01-09 DIAGNOSIS — L089 Local infection of the skin and subcutaneous tissue, unspecified: Secondary | ICD-10-CM

## 2022-01-09 DIAGNOSIS — E11628 Type 2 diabetes mellitus with other skin complications: Secondary | ICD-10-CM

## 2022-01-09 LAB — CBC WITH DIFFERENTIAL/PLATELET
Abs Immature Granulocytes: 0.71 10*3/uL — ABNORMAL HIGH (ref 0.00–0.07)
Basophils Absolute: 0.1 10*3/uL (ref 0.0–0.1)
Basophils Relative: 0 %
Eosinophils Absolute: 0.2 10*3/uL (ref 0.0–0.5)
Eosinophils Relative: 2 %
HCT: 33.4 % — ABNORMAL LOW (ref 36.0–46.0)
Hemoglobin: 10.6 g/dL — ABNORMAL LOW (ref 12.0–15.0)
Immature Granulocytes: 5 %
Lymphocytes Relative: 12 %
Lymphs Abs: 1.9 10*3/uL (ref 0.7–4.0)
MCH: 30.9 pg (ref 26.0–34.0)
MCHC: 31.7 g/dL (ref 30.0–36.0)
MCV: 97.4 fL (ref 80.0–100.0)
Monocytes Absolute: 0.6 10*3/uL (ref 0.1–1.0)
Monocytes Relative: 4 %
Neutro Abs: 12.4 10*3/uL — ABNORMAL HIGH (ref 1.7–7.7)
Neutrophils Relative %: 77 %
Platelets: 274 10*3/uL (ref 150–400)
RBC: 3.43 MIL/uL — ABNORMAL LOW (ref 3.87–5.11)
RDW: 12.2 % (ref 11.5–15.5)
WBC: 15.9 10*3/uL — ABNORMAL HIGH (ref 4.0–10.5)
nRBC: 0.2 % (ref 0.0–0.2)

## 2022-01-09 LAB — COMPREHENSIVE METABOLIC PANEL
ALT: 19 U/L (ref 0–44)
AST: 16 U/L (ref 15–41)
Albumin: 3.1 g/dL — ABNORMAL LOW (ref 3.5–5.0)
Alkaline Phosphatase: 64 U/L (ref 38–126)
Anion gap: 7 (ref 5–15)
BUN: 19 mg/dL (ref 8–23)
CO2: 26 mmol/L (ref 22–32)
Calcium: 8.9 mg/dL (ref 8.9–10.3)
Chloride: 105 mmol/L (ref 98–111)
Creatinine, Ser: 1.02 mg/dL — ABNORMAL HIGH (ref 0.44–1.00)
Glucose, Bld: 219 mg/dL — ABNORMAL HIGH (ref 70–99)
Potassium: 3.6 mmol/L (ref 3.5–5.1)
Sodium: 138 mmol/L (ref 135–145)
Total Bilirubin: 0.1 mg/dL — ABNORMAL LOW (ref 0.3–1.2)
Total Protein: 5.7 g/dL — ABNORMAL LOW (ref 6.5–8.1)

## 2022-01-09 LAB — GLUCOSE, CAPILLARY
Glucose-Capillary: 213 mg/dL — ABNORMAL HIGH (ref 70–99)
Glucose-Capillary: 210 mg/dL — ABNORMAL HIGH (ref 70–99)
Glucose-Capillary: 269 mg/dL — ABNORMAL HIGH (ref 70–99)
Glucose-Capillary: 204 mg/dL — ABNORMAL HIGH (ref 70–99)

## 2022-01-09 LAB — CULTURE, BLOOD (ROUTINE X 2)

## 2022-01-09 LAB — MAGNESIUM: Magnesium: 2.6 mg/dL — ABNORMAL HIGH (ref 1.7–2.4)

## 2022-01-09 MED ORDER — INSULIN GLARGINE-YFGN 100 UNIT/ML ~~LOC~~ SOLN
15.0000 [IU] | Freq: Every day | SUBCUTANEOUS | Status: DC
  Administered 2022-01-09: 15 [IU] via SUBCUTANEOUS
  Filled 2022-01-09 (×2): qty 0.15

## 2022-01-09 MED ORDER — PENICILLIN G POTASSIUM 20000000 UNITS IJ SOLR
12.0000 10*6.[IU] | Freq: Two times a day (BID) | INTRAVENOUS | Status: DC
  Administered 2022-01-09 – 2022-01-11 (×5): 12 10*6.[IU] via INTRAVENOUS
  Filled 2022-01-09 (×7): qty 12

## 2022-01-09 NOTE — Consult Note (Signed)
PODIATRY CONSULTATION  NAME Mary Swanson MRN [id number] DOB 03-16-1956 DOA 01/06/2022   Reason for consult: Abscess/cellulitis RT foot Chief Complaint  Patient presents with   Altered Mental Status   Code Sepsis    Consulting physician: Berle Mull, MD  History of present illness: 66 y.o. female very pleasant PMHx advanced dementia, diabetes mellitus type 2, HTN, admitted for severe sepsis secondary to right foot infection.  Patient states she is comfortable.  She is having some right foot pain.  She is a diabetic with last A1c of 7.9%.  Her white count is trending down.  The redness is improving.  Past Medical History:  Diagnosis Date   Acute lower UTI [id number]   Acute metabolic encephalopathy [id number]   Diabetes mellitus ([place]) 12/28/2019   Diabetes mellitus without complication ([place])    Hypertension    Memory loss of unknown cause 12/03/2019    CBC Latest Ref Rng & Units 01/09/2022 01/08/2022 01/07/2022  WBC 4.0 - 10.5 K/uL 15.9(H) 19.8(H) 19.3(H)  Hemoglobin 12.0 - 15.0 g/dL 10.6(L) 9.7(L) 10.3(L)  Hematocrit 36.0 - 46.0 % 33.4(L) 31.8(L) 33.2(L)  Platelets 150 - 400 K/uL 274 PLATELET CLUMPS NOTED ON SMEAR, UNABLE TO ESTIMATE 259    BMP Latest Ref Rng & Units 01/09/2022 01/08/2022 01/07/2022  Glucose 70 - 99 mg/dL 219(H) 197(H) 113(H)  BUN 8 - 23 mg/dL 19 20 22   Creatinine 0.44 - 1.00 mg/dL 1.02(H) 1.04(H) 1.22(H)  BUN/Creat Ratio 12 - 28 - - -  Sodium 135 - 145 mmol/L 138 139 147(H)  Potassium 3.5 - 5.1 mmol/L 3.6 3.3(L) 3.7  Chloride 98 - 111 mmol/L 105 106 112(H)  CO2 22 - 32 mmol/L 26 27 27   Calcium 8.9 - 10.3 mg/dL 8.9 8.8(L) 9.6      Physical Exam: General: The patient is alert and oriented x3 in no acute distress.   Dermatology: No open wounds noted. Hyperkeratotic callus noted to the plantar 5th MTP joint, however there is no open wound. No interdigital maceration.  There are some redness noted circumferential around the first metatarsophalangeal joint.  No  area of fluctuance noted.  Unable to appreciate abscess.  Vascular: Palpable pedal pulses bilaterally. Erythema and edema noted most prevalent around the first ray and medial aspect of the forefoot.  Neurological: Epicritic and protective threshold diminished   Musculoskeletal Exam: No structural deformity noted. There is tenderness to palpation of the forefoot. No crepitus.   XR FOOT RT 01/06/2022: FINDINGS: There is no evidence of fracture or dislocation. There is no evidence of arthropathy or other focal bone abnormality. Subcutaneus soft tissue edema of the forefoot/midfoot dorsum. Vascular calcifications.   IMPRESSION: Negative.  MR FOOT RT WO CONTRAST 01/07/2022: IMPRESSION: 1. No evidence of acute osteomyelitis of the right forefoot. 2. Circumferential soft tissue edema. Small areas of slightly more confluent fluid within the plantar-medial soft tissues underlying the first metatarsal concerning for small developing abscesses. 3. Trace tenosynovitis of the flexor hallucis longus tendon at the level of the great toe proximal phalanx. 4. Diffuse edema-like signal throughout the foot musculature may represent changes related to denervation or myositis.  ASSESSMENT/PLAN OF CARE 1.  Sepsis secondary to right foot infection -MRI was reviewed.  At the moment there is no open wound and there are very small questionable abscesses developing along the first MTP plantar aspect. - Clinically the redness is improving along with white blood cell count that is trending down.  For now we will continue treated with local IV antibiotics. -I will  empirically put her on the OR schedule for Wednesday in case if the infections worsens or progressively gets worse. -I am hopeful that patient can be treated with antibiotics alone. -She is a high risk of developing wound complication given her [id number] is 7.9%. -We will also need to obtain consent from power of attorney in order to undergo surgery. -No  wound care needed at this time as patient does not have any wounds. -We will continue to follow.

## 2022-01-09 NOTE — Evaluation (Signed)
Physical Therapy Evaluation Patient Details Name: Mary Swanson MRN: [id number] DOB: [birth year] Today's Date: 01/09/2022  History of Present Illness  66 yo female admitted with R foot infection, sepsis. Hx of dementia, DM  Clinical Impression  On eval, pt required Min A for mobility. She did require repeated multimodal cueing for participation. Will plan to follow and progress activity as tolerated. Recommend return to SNF once discharged.        Recommendations for follow up therapy are one component of a multi-disciplinary discharge planning process, led by the attending physician.  Recommendations may be updated based on patient status, additional functional criteria and insurance authorization.  Follow Up Recommendations Skilled nursing-short term rehab (<3 hours/day)    Assistance Recommended at Discharge Frequent or constant Supervision/Assistance  Patient can return home with the following  Help with stairs or ramp for entrance;Assist for transportation;Assistance with cooking/housework;A lot of help with walking and/or transfers;A lot of help with bathing/dressing/bathroom    Equipment Recommendations None recommended by PT  Recommendations for Other Services       Functional Status Assessment       Precautions / Restrictions Precautions Precautions: Fall Restrictions Weight Bearing Restrictions: No      Mobility  Bed Mobility Overal bed mobility: Needs Assistance Bed Mobility: Supine to Sit     Supine to sit: Min assist, HOB elevated     General bed mobility comments: Multimodal, repeated cueing. Seemed to initiate more once I said "lets walk." Increased time. Has difficulty completing tasks without repeated cues    Transfers Overall transfer level: Needs assistance Equipment used: Rollator (4 wheels), None Transfers: Sit to/from Stand, Bed to chair/wheelchair/BSC Sit to Stand: Min assist           General transfer comment: 2 attempts to stand. On 1st  attempt, pt stood but immediately reached out for IV pole to grab on to. She is very unsteady. Sat back down then stood a 2nd time with walker-some improvement but still weak and unsteady.    Ambulation/Gait Ambulation/Gait assistance: Min assist Gait Distance (Feet): 3 Feet Assistive device: Rollator (4 wheels) Gait Pattern/deviations: Step-through pattern, Decreased stride length, Trunk flexed       General Gait Details: Instead of pivoting to recliner, pt began to take about 2-4 steps forwards with walker. She then needed some guidance to turn, back up a bit and sit in recliner. Repeated multimodal cueing required. Again, she is unsteady and weak.  Stairs            Wheelchair Mobility    Modified Rankin (Stroke Patients Only)       Balance Overall balance assessment: Needs assistance         Standing balance support: Bilateral upper extremity supported Standing balance-Leahy Scale: Poor                               Pertinent Vitals/Pain Pain Assessment Pain Assessment: Faces Faces Pain Scale: No hurt    Home Living Family/patient expects to be discharged to:: Skilled nursing facility                   Additional Comments: return to SNF    Prior Function Unsure of PLOF                      Hand Dominance        Extremity/Trunk Assessment   Upper Extremity Assessment Upper Extremity Assessment:  Generalized weakness    Lower Extremity Assessment Lower Extremity Assessment: Generalized weakness    Cervical / Trunk Assessment Cervical / Trunk Assessment: Normal  Communication   Communication: Expressive difficulties;Receptive difficulties  Cognition Arousal/Alertness: Awake/alert Behavior During Therapy: WFL for tasks assessed/performed Overall Cognitive Status: History of cognitive impairments - at baseline                                 General Comments: minimal conversaton. pt seems to have  receptive and expressive deficits        General Comments      Exercises     Assessment/Plan    PT Assessment Patient needs continued PT services  PT Problem List Decreased strength;Decreased mobility;Decreased activity tolerance;Decreased balance;Decreased knowledge of use of DME       PT Treatment Interventions DME instruction;Gait training;Balance training;Functional mobility training;Therapeutic activities;Patient/family education    PT Goals (Current goals can be found in the Care Plan section)  Acute Rehab PT Goals Patient Stated Goal: pt unable to state PT Goal Formulation: Patient unable to participate in goal setting Time For Goal Achievement: 01/23/22 Potential to Achieve Goals: Fair    Frequency Min 2X/week     Co-evaluation               AM-PAC PT "6 Clicks" Mobility  Outcome Measure Help needed turning from your back to your side while in a flat bed without using bedrails?: A Little Help needed moving from lying on your back to sitting on the side of a flat bed without using bedrails?: A Little Help needed moving to and from a bed to a chair (including a wheelchair)?: A Little Help needed standing up from a chair using your arms (e.g., wheelchair or bedside chair)?: A Lot Help needed to walk in hospital room?: A Lot Help needed climbing 3-5 steps with a railing? : Total 6 Click Score: 14    End of Session Equipment Utilized During Treatment: Gait belt Activity Tolerance: Patient tolerated treatment well Patient left: in chair;with call bell/phone within reach;with chair alarm set   PT Visit Diagnosis: Muscle weakness (generalized) (M62.81);Difficulty in walking, not elsewhere classified (R26.2)    Time: 7001-7494 PT Time Calculation (min) (ACUTE ONLY): 17 min   Charges:   PT Evaluation $PT Eval Moderate Complexity: 1 Mod             Faye Ramsay, PT Acute Rehabilitation  Office: [phone] Pager: [phone]

## 2022-01-09 NOTE — Progress Notes (Signed)
Progress Note   Patient: Mary Swanson [id number] DOB: 1956-08-05 DOA: 01/06/2022     Hospitalization day: 3 DOS: the patient was seen and examined on 01/09/2022   Brief hospital course: 66 year old female with past medical history of Alzheimer's dementia, type II DM, HTN, HLD presented with tachycardia and fever.  Found to have right foot infection. 2/10 admitted for diabetic foot infection. 2/11 blood cultures positive for strep positives.  Mentation improving but not back to baseline. 2/12 podiatry consulted, ID consulted.  Conservative management recommended. 2/30 antibiotic changed to penicillin.  Assessment and Plan: * Sepsis due to Streptococcus pyogenes due to right foot cellulitis- (present on admission) Meeting SIRS criteria on admission with encephalopathy, tachypnea, tachycardia, fever. Currently sepsis physiology is resolving. Lactic acid is normal. Procalcitonin level 6.57. CRP 25.7. Source of infection right foot cellulitis. Initially started on IV vancomycin, ceftriaxone and Flagyl. Blood culture came back positive for strep pyogenes. MRI negative for osteomyelitis but does show evidence of microabscesses. ID consulted. Appreciate assistance.  Now on IV penicillin. Podiatry consulted, currently recommending close observation and conservative management.  AKI (acute kidney injury) ([place])- (present on admission) Baseline serum creatinine 0.67.  On presentation serum creatinine 1.21 elevated BUN as well. Currently receiving IV hydration. Serum creatinine improving.  Close to normal.  Alzheimer's dementia with behavioral disturbance ([place])- (present on admission) Patient is on Seroquel and Ativan. For now holding this medication. If patient becomes agitated may use Ativan as needed.  Acute metabolic encephalopathy- (present on admission) Resolved. At baseline patient is pleasantly confused but able to converse coherently. Currently incoherent speech.  Very rarely  able to follow commands. CT of the head unremarkable.  MRI brain also negative for any acute abnormality. Metabolic work-up otherwise unremarkable including ammonia level, B12, TSH.  Transaminitis- (present on admission) Resolved. In the setting of sepsis.  Essential hypertension- (present on admission) Blood pressure mildly elevated. Continue hydralazine as needed.  On Norvasc at home. Will resume.  Uncontrolled type 2 diabetes mellitus with hyperglycemia, with long-term current use of insulin with hyperlipidemia Hemoglobin A1c 7.9. Continue sliding scale insulin. Resume Lantus and hold metformin.  Hyperlipidemia- (present on admission) Continue Crestor.  Obesity (BMI 30-39.9)- (present on admission) Body mass index is 28.72 kg/m.  Placing the patient at high risk of poor outcome.        Subjective: No nausea no vomiting no fever no chills.  Remains pleasantly confused.  Physical Exam: Vitals:   01/09/22 0418 01/09/22 0500 01/09/22 1059 01/09/22 1552  BP: (!) 151/72  (!) 151/64 137/60  Pulse: 78   78  Resp: 20     Temp: 98.9 F (37.2 C)   99.1 F (37.3 C)  TempSrc: Oral   Oral  SpO2: 97%   97%  Weight:  80.7 kg    Height:       General: Appear in mild distress; no visible Abnormal Neck Mass Or lumps, Conjunctiva normal Cardiovascular: S1 and S2 Present, no Murmur, Respiratory: good respiratory effort, Bilateral Air entry present and CTA, no Crackles, no wheezes Abdomen: Bowel Sound present Extremities: Trace right-sided pedal edema Neurology: alert and not oriented to time, place, and person Gait not checked due to patient safety concerns   Data Reviewed:  I have Reviewed nursing notes, Vitals, and Lab results since pt's last encounter. Pertinent lab results CBC and BMP I have ordered test including CBC and BMP I have reviewed the last note from podiatry,  I have discussed pt's care plan and test results with infectious  disease.   Family Communication:  Discussed with family on the phone  Disposition: Status is: Inpatient Remains inpatient appropriate because: Need IV antibiotic as well as further work-up and treatment.  Close observation as potentially may require surgery on 2/15.  Author: Berle Mull, MD 01/09/2022 7:47 PM  For on call review [email or web address].

## 2022-01-09 NOTE — Progress Notes (Signed)
Inpatient Diabetes Program Recommendations  AACE/ADA: New Consensus Statement on Inpatient Glycemic Control (2015)  Target Ranges:  Prepandial:   less than 140 mg/dL      Peak postprandial:   less than 180 mg/dL (1-2 hours)      Critically ill patients:  140 - 180 mg/dL    Latest Reference Range & Units 01/08/22 07:40 01/08/22 11:17 01/08/22 16:55 01/08/22 21:08  Glucose-Capillary 70 - 99 mg/dL 952 (H)  5 units Novolog   228 (H)  5 units Novolog  248 (H)  5 units Novolog  177 (H)    12 units Semglee    Latest Reference Range & Units 01/09/22 07:35 01/09/22 11:58  Glucose-Capillary 70 - 99 mg/dL 841 (H) 324 (H)    Home DM Meds: Lantus 30 units QHS   Humalog 0-14 units TID per SSI   Metformin 1000 mg daily    Current Orders: Semglee 15 units QHS    Novolog 0-15 units TID ac/hs    Note Semglee increased for tonight (got 12 units last PM)   MD- Please consider also adding Novolog Meal Coverage: Novolog 4 units TID with meals HOLD if pt eats <50% meals      --Will follow patient during hospitalization--  Ambrose Finland RN, MSN, CDE Diabetes Coordinator Inpatient Glycemic Control Team Team Pager: [phone] (8a-5p)

## 2022-01-09 NOTE — Progress Notes (Signed)
[facility]   Reason for visit: Follow up on right foot cellulitis  Interval History: afebrile 48 hours, WBC down to 15.9 with a peak of 31.4.   Day 4 total antibiotics  Physical Exam: Constitutional:  Vitals:   01/09/22 0418 01/09/22 1059  BP: (!) 151/72 (!) 151/64  Pulse: 78   Resp: 20   Temp: 98.9 F (37.2 C)   SpO2: 97%    patient appears in NAD Respiratory: Normal respiratory effort; CTA B Cardiovascular: RRR MS: right foot with minimal erythema, no warmth  Review of Systems: Unable to be assessed due to mental status/dementia  Lab Results  Component Value Date   WBC 15.9 (H) 01/09/2022   HGB 10.6 (L) 01/09/2022   HCT 33.4 (L) 01/09/2022   MCV 97.4 01/09/2022   PLT 274 01/09/2022    Lab Results  Component Value Date   CREATININE 1.02 (H) 01/09/2022   BUN 19 01/09/2022   NA 138 01/09/2022   K 3.6 01/09/2022   CL 105 01/09/2022   CO2 26 01/09/2022    Lab Results  Component Value Date   ALT 19 01/09/2022   AST 16 01/09/2022   GGT 160 (H) 01/07/2022   ALKPHOS 64 01/09/2022     Microbiology: Recent Results (from the past 240 hour(s))  Blood Culture (routine x 2)     Status: Abnormal   Collection Time: 01/06/22  1:41 PM   Specimen: BLOOD  Result Value Ref Range Status   Specimen Description   Final    BLOOD RIGHT ANTECUBITAL Performed at [facility], [address] [place], [place] [address]    Special Requests   Final    BOTTLES DRAWN AEROBIC AND ANAEROBIC Blood Culture results may not be optimal due to an inadequate volume of blood received in culture bottles Performed at [facility], [address] [place], [place] [address]    Culture  Setup Time   Final    GRAM POSITIVE COCCI AEROBIC BOTTLE ONLY CRITICAL VALUE NOTED.  VALUE IS CONSISTENT WITH PREVIOUSLY REPORTED AND CALLED VALUE.    Culture (A)  Final    STREPTOCOCCUS PYOGENES SUSCEPTIBILITIES PERFORMED ON PREVIOUS  CULTURE WITHIN THE LAST 5 DAYS. HEALTH DEPARTMENT NOTIFIED Performed at [facility], [address] [place], [place] [address]    Report Status 01/09/2022 FINAL  Final  Urine Culture     Status: None   Collection Time: 01/06/22  1:41 PM   Specimen: In/Out Cath Urine  Result Value Ref Range Status   Specimen Description   Final    IN/OUT CATH URINE Performed at [facility], [address], [place], [place] [address]    Special Requests   Final    NONE Performed at [facility], [address] [place], [place] [address]    Culture   Final    NO GROWTH Performed at [facility], [address] [place], [place] [address]    Report Status 01/07/2022 FINAL  Final  Blood Culture (routine x 2)     Status: Abnormal   Collection Time: 01/06/22  1:46 PM   Specimen: BLOOD  Result Value Ref Range Status   Specimen Description   Final    BLOOD RIGHT ANTECUBITAL Performed at [facility], [address] [place], [place] [address]    Special Requests   Final    BOTTLES DRAWN AEROBIC AND ANAEROBIC Blood Culture results may  not be optimal due to an inadequate volume of blood received in culture bottles Performed at [facility] [address], [place], [place] [address]    Culture  Setup Time   Final    GRAM POSITIVE COCCI IN BOTH AEROBIC AND ANAEROBIC BOTTLES CRITICAL RESULT CALLED TO, READ BACK BY AND VERIFIED WITH: Vena Austria S.CHRISTY ON FU:[id number] AT 1100 BY E.PARRISH    Culture (A)  Final    STREPTOCOCCUS PYOGENES HEALTH DEPARTMENT NOTIFIED Performed at [facility], [place] [address], [place], [place] [address]    Report Status 01/09/2022 FINAL  Final   Organism ID, Bacteria STREPTOCOCCUS PYOGENES  Final      Susceptibility   Streptococcus pyogenes - MIC*    PENICILLIN <=0.06 SENSITIVE Sensitive     CEFTRIAXONE <=0.12 SENSITIVE Sensitive     ERYTHROMYCIN <=0.12 SENSITIVE Sensitive      LEVOFLOXACIN 0.5 SENSITIVE Sensitive     VANCOMYCIN 0.5 SENSITIVE Sensitive     * STREPTOCOCCUS PYOGENES  Blood Culture ID Panel (Reflexed)     Status: Abnormal   Collection Time: 01/06/22  1:46 PM  Result Value Ref Range Status   Enterococcus faecalis NOT DETECTED NOT DETECTED Final   Enterococcus Faecium NOT DETECTED NOT DETECTED Final   Listeria monocytogenes NOT DETECTED NOT DETECTED Final   Staphylococcus species NOT DETECTED NOT DETECTED Final   Staphylococcus aureus (BCID) NOT DETECTED NOT DETECTED Final   Staphylococcus epidermidis NOT DETECTED NOT DETECTED Final   Staphylococcus lugdunensis NOT DETECTED NOT DETECTED Final   Streptococcus species DETECTED (A) NOT DETECTED Final    Comment: CRITICAL RESULT CALLED TO, READ BACK BY AND VERIFIED WITH: PHARM D S.CHRISTY ON [id number] AT 1100 BY E.PARRISH    Streptococcus agalactiae NOT DETECTED NOT DETECTED Final   Streptococcus pneumoniae NOT DETECTED NOT DETECTED Final   Streptococcus pyogenes DETECTED (A) NOT DETECTED Final    Comment: CRITICAL RESULT CALLED TO, READ BACK BY AND VERIFIED WITH: PHARM D S.CHRISTY ON FU:[id number] AT 1100 BY E.PARRISH    A.calcoaceticus-baumannii NOT DETECTED NOT DETECTED Final   Bacteroides fragilis NOT DETECTED NOT DETECTED Final   Enterobacterales NOT DETECTED NOT DETECTED Final   Enterobacter cloacae complex NOT DETECTED NOT DETECTED Final   Escherichia coli NOT DETECTED NOT DETECTED Final   Klebsiella aerogenes NOT DETECTED NOT DETECTED Final   Klebsiella oxytoca NOT DETECTED NOT DETECTED Final   Klebsiella pneumoniae NOT DETECTED NOT DETECTED Final   Proteus species NOT DETECTED NOT DETECTED Final   Salmonella species NOT DETECTED NOT DETECTED Final   Serratia marcescens NOT DETECTED NOT DETECTED Final   Haemophilus influenzae NOT DETECTED NOT DETECTED Final   Neisseria meningitidis NOT DETECTED NOT DETECTED Final   Pseudomonas aeruginosa NOT DETECTED NOT DETECTED Final   Stenotrophomonas  maltophilia NOT DETECTED NOT DETECTED Final   Candida albicans NOT DETECTED NOT DETECTED Final   Candida auris NOT DETECTED NOT DETECTED Final   Candida glabrata NOT DETECTED NOT DETECTED Final   Candida krusei NOT DETECTED NOT DETECTED Final   Candida parapsilosis NOT DETECTED NOT DETECTED Final   Candida tropicalis NOT DETECTED NOT DETECTED Final   Cryptococcus neoformans/gattii NOT DETECTED NOT DETECTED Final    Comment: Performed at [facility], [place] [place], [place] [address]  Resp Panel by RT-PCR (Flu A&B, Covid) Nasopharyngeal Swab     Status: None   Collection Time: 01/06/22  2:09 PM   Specimen: Nasopharyngeal Swab; Nasopharyngeal(NP) swabs in vial transport medium  Result Value  Ref Range Status   SARS Coronavirus 2 by RT PCR NEGATIVE NEGATIVE Final    Comment: (NOTE) SARS-CoV-2 target nucleic acids are NOT DETECTED.  The SARS-CoV-2 RNA is generally detectable in upper respiratory specimens during the acute phase of infection. The lowest concentration of SARS-CoV-2 viral copies this assay can detect is 138 copies/mL. A negative result does not preclude SARS-Cov-2 infection and should not be used as the sole basis for treatment or other patient management decisions. A negative result may occur with  improper specimen collection/handling, submission of specimen other than nasopharyngeal swab, presence of viral mutation(s) within the areas targeted by this assay, and inadequate number of viral copies(<138 copies/mL). A negative result must be combined with clinical observations, patient history, and epidemiological information. The expected result is Negative.  Fact Sheet for Patients:  EntrepreneurPulse.com.au  Fact Sheet for Healthcare Providers:  IncredibleEmployment.be  This test is no t yet approved or cleared by the [place] FDA and  has been authorized for detection and/or diagnosis of SARS-CoV-2 by FDA under  an Emergency Use Authorization (EUA). This EUA will remain  in effect (meaning this test can be used) for the duration of the COVID-19 declaration under Section 564(b)(1) of the Act, 21 U.S.C.section 360bbb-3(b)(1), unless the authorization is terminated  or revoked sooner.       Influenza A by PCR NEGATIVE NEGATIVE Final   Influenza B by PCR NEGATIVE NEGATIVE Final    Comment: (NOTE) The Xpert Xpress SARS-CoV-2/FLU/RSV plus assay is intended as an aid in the diagnosis of influenza from Nasopharyngeal swab specimens and should not be used as a sole basis for treatment. Nasal washings and aspirates are unacceptable for Xpert Xpress SARS-CoV-2/FLU/RSV testing.  Fact Sheet for Patients: EntrepreneurPulse.com.au  Fact Sheet for Healthcare Providers: IncredibleEmployment.be  This test is not yet approved or cleared by the [place] FDA and has been authorized for detection and/or diagnosis of SARS-CoV-2 by FDA under an Emergency Use Authorization (EUA). This EUA will remain in effect (meaning this test can be used) for the duration of the COVID-19 declaration under Section 564(b)(1) of the Act, 21 U.S.C. section 360bbb-3(b)(1), unless the authorization is terminated or revoked.  Performed at [facility], [place] [address] [place], [place] [address]   Culture, blood (routine x 2)     Status: None (Preliminary result)   Collection Time: 01/08/22  7:29 AM   Specimen: BLOOD  Result Value Ref Range Status   Specimen Description   Final    BLOOD BLOOD RIGHT FOREARM Performed at [facility] [address] [place], [place] [address]    Special Requests   Final    BOTTLES DRAWN AEROBIC ONLY Blood Culture adequate volume Performed at [facility] [address], [place], [place] [address]    Culture   Final    NO GROWTH < 24 HOURS Performed at [facility] [address],  [place], [place] [address]    Report Status PENDING  Incomplete  Culture, blood (routine x 2)     Status: None (Preliminary result)   Collection Time: 01/08/22  7:29 AM   Specimen: BLOOD  Result Value Ref Range Status   Specimen Description   Final    BLOOD RIGHT ANTECUBITAL Performed at [facility] [address] [place], [place] [address]    Special Requests   Final    BOTTLES DRAWN AEROBIC ONLY Blood Culture adequate volume Performed at [facility], [address] [place], [place] [address]    Culture   Final    NO GROWTH < 24 HOURS Performed at [facility] [address] [place], [place] [address]    Report Status PENDING  Incomplete    Impression/Plan:  1. Soft tissue infection - possible developing abscess at the first metatarsal area and some tenosynovitis.  Group A strep in both sets of blood cultures. On ceftriaxone and will transition to penicillin With bacteremia and tenosynovitis, plan on 2 weeks of antibiotics and will use oral amoxicillin at discharge.   No necrotizing infection so have stopped clindamycin.    2.  Fever - improved on treatment for Strep pyogenes.  No afebrile 48 hours.  Will continue to monitor.   3.  Leukocytosis - much improved since admission and down to 15.9.  will continue to monitor.

## 2022-01-10 ENCOUNTER — Encounter (HOSPITAL_COMMUNITY): Payer: Self-pay | Admitting: Certified Registered Nurse Anesthetist

## 2022-01-10 LAB — COMPREHENSIVE METABOLIC PANEL
ALT: 37 U/L (ref 0–44)
AST: 30 U/L (ref 15–41)
Albumin: 2.1 g/dL — ABNORMAL LOW (ref 3.5–5.0)
Alkaline Phosphatase: 99 U/L (ref 38–126)
Anion gap: 4 — ABNORMAL LOW (ref 5–15)
BUN: 17 mg/dL (ref 8–23)
CO2: 26 mmol/L (ref 22–32)
Calcium: 8.7 mg/dL — ABNORMAL LOW (ref 8.9–10.3)
Chloride: 105 mmol/L (ref 98–111)
Creatinine, Ser: 1.01 mg/dL — ABNORMAL HIGH (ref 0.44–1.00)
GFR, Estimated: >60 mL/min (ref >60)
Glucose, Bld: 233 mg/dL — ABNORMAL HIGH (ref 70–99)
Potassium: 3.8 mmol/L (ref 3.5–5.1)
Sodium: 135 mmol/L (ref 135–145)
Total Bilirubin: 0.5 mg/dL (ref 0.3–1.2)
Total Protein: 7.8 g/dL (ref 6.5–8.1)

## 2022-01-10 LAB — CBC WITH DIFFERENTIAL/PLATELET
Abs Immature Granulocytes: 0.56 10*3/uL — ABNORMAL HIGH (ref 0.00–0.07)
Basophils Absolute: 0.1 10*3/uL (ref 0.0–0.1)
Basophils Relative: 1 %
Eosinophils Absolute: 0.3 10*3/uL (ref 0.0–0.5)
Eosinophils Relative: 3 %
HCT: 29.9 % — ABNORMAL LOW (ref 36.0–46.0)
Hemoglobin: 9.4 g/dL — ABNORMAL LOW (ref 12.0–15.0)
Immature Granulocytes: 5 %
Lymphocytes Relative: 18 %
Lymphs Abs: 2 10*3/uL (ref 0.7–4.0)
MCH: 30.4 pg (ref 26.0–34.0)
MCHC: 31.4 g/dL (ref 30.0–36.0)
MCV: 96.8 fL (ref 80.0–100.0)
Monocytes Absolute: 0.6 10*3/uL (ref 0.1–1.0)
Monocytes Relative: 5 %
Neutro Abs: 7.4 10*3/uL (ref 1.7–7.7)
Neutrophils Relative %: 68 %
Platelets: 268 10*3/uL (ref 150–400)
RBC: 3.09 MIL/uL — ABNORMAL LOW (ref 3.87–5.11)
RDW: 12.4 % (ref 11.5–15.5)
WBC: 11 10*3/uL — ABNORMAL HIGH (ref 4.0–10.5)
nRBC: 0.4 % — ABNORMAL HIGH (ref 0.0–0.2)

## 2022-01-10 LAB — MAGNESIUM: Magnesium: 2.1 mg/dL (ref 1.7–2.4)

## 2022-01-10 LAB — GLUCOSE, CAPILLARY
Glucose-Capillary: 235 mg/dL — ABNORMAL HIGH (ref 70–99)
Glucose-Capillary: 287 mg/dL — ABNORMAL HIGH (ref 70–99)
Glucose-Capillary: 258 mg/dL — ABNORMAL HIGH (ref 70–99)
Glucose-Capillary: 342 mg/dL — ABNORMAL HIGH (ref 70–99)

## 2022-01-10 MED ORDER — GLUCERNA SHAKE PO LIQD
237.0000 mL | Freq: Two times a day (BID) | ORAL | Status: DC
  Administered 2022-01-11 – 2022-01-12 (×3): 237 mL via ORAL
  Filled 2022-01-10 (×4): qty 237

## 2022-01-10 MED ORDER — INSULIN GLARGINE-YFGN 100 UNIT/ML ~~LOC~~ SOLN
20.0000 [IU] | Freq: Every day | SUBCUTANEOUS | Status: DC
  Administered 2022-01-10: 20 [IU] via SUBCUTANEOUS
  Filled 2022-01-10 (×2): qty 0.2

## 2022-01-10 MED ORDER — QUETIAPINE FUMARATE 25 MG PO TABS
25.0000 mg | ORAL_TABLET | Freq: Two times a day (BID) | ORAL | Status: DC | PRN
  Filled 2022-01-10: qty 1

## 2022-01-10 MED ORDER — FERROUS SULFATE 325 (65 FE) MG PO TABS
325.0000 mg | ORAL_TABLET | Freq: Every day | ORAL | Status: DC
  Administered 2022-01-11 – 2022-01-12 (×2): 325 mg via ORAL
  Filled 2022-01-10 (×2): qty 1

## 2022-01-10 NOTE — Care Management Important Message (Signed)
Important Message  Patient Details IM Letter placed in Patients room. Name: Mary Swanson MRN: [id number] Date of Birth: 1956-03-21   Medicare Important Message Given:  Yes     Caren Macadam 01/10/2022, 1:28 PM

## 2022-01-10 NOTE — NC FL2 (Signed)
[place] MEDICAID FL2 LEVEL OF CARE SCREENING TOOL     IDENTIFICATION  Patient Name: Mary Swanson Birthdate: 03-13-1956 Sex: female Admission Date (Current Location): 01/06/2022  [facility] and [place] Number:  Producer, television/film/video and Address:  [facility],  [address], [place] [address]      Provider Number: [id number]  Attending Physician Name and Address:  Rolly Salter, MD  Relative Name and Phone Number:  [name] friend [phone]    Current Level of Care: Hospital Recommended Level of Care: Memory Care Prior Approval Number:    Date Approved/Denied:   PASRR Number: [id number]  Discharge Plan: Other (Comment) (memory care)    Current Diagnoses: Patient Active Problem List   Diagnosis Date Noted   Uncontrolled type 2 diabetes mellitus with hyperglycemia, with long-term current use of insulin with hyperlipidemia 01/07/2022   Diabetic foot infection (HCC) 01/06/2022   Sepsis due to Streptococcus pyogenes due to right foot cellulitis 01/06/2022   Transaminitis 01/06/2022   Hypernatremia 06/16/2021   Alzheimer's dementia with behavioral disturbance (HCC)    Altered mental status 06/15/2021   Hospital discharge follow-up 01/02/2020   Abnormal CXR    Essential hypertension    Acute metabolic encephalopathy 12/28/2019   AKI (acute kidney injury) (HCC) 12/27/2019   Dehydration 12/27/2019   Elevated blood pressure reading 12/03/2019   Hyperglycemia 12/03/2019   ACE-inhibitor cough 03/14/2019   Heart murmur, systolic 05/29/2017   Hyperlipidemia 01/24/2007   Obesity (BMI 30-39.9) 01/24/2007   Anemia 01/24/2007   History of depression 01/24/2007   RHINITIS, ALLERGIC 01/24/2007    Orientation RESPIRATION BLADDER Height & Weight     Self, Situation  Normal Incontinent Weight: 80.1 kg Height:  5\' 6"  (167.6 cm)  BEHAVIORAL SYMPTOMS/MOOD NEUROLOGICAL BOWEL NUTRITION STATUS      Incontinent Diet (CHO MOD)  AMBULATORY STATUS COMMUNICATION  OF NEEDS Skin   Limited Assist Verbally Other (Comment) (R foot cellulitis/abscess)                       Personal Care Assistance Level of Assistance  Bathing, Feeding, Dressing Bathing Assistance: Limited assistance Feeding assistance: Limited assistance Dressing Assistance: Limited assistance     Functional Limitations Info  Sight, Hearing, Speech Sight Info: Adequate Hearing Info: Adequate Speech Info: Adequate    SPECIAL CARE FACTORS FREQUENCY  PT (By licensed PT), OT (By licensed OT)     PT Frequency:  (3x week) OT Frequency:  (3x week)            Contractures Contractures Info: Not present    Additional Factors Info  Code Status, Allergies Code Status Info:  (Full) Allergies Info:  (erythromycin,latex,lisinopril)           Current Medications (01/10/2022):  This is the current hospital active medication list Current Facility-Administered Medications  Medication Dose Route Frequency Provider Last Rate Last Admin   acetaminophen (TYLENOL) tablet 650 mg  650 mg Oral Q6H PRN Howerter, Justin B, DO   650 mg at 01/08/22 1731   Or   acetaminophen (TYLENOL) suppository 650 mg  650 mg Rectal Q6H PRN Howerter, Justin B, DO       amLODipine (NORVASC) tablet 5 mg  5 mg Oral Daily 03/08/22, MD   5 mg at 01/10/22 0934   aspirin chewable tablet 81 mg  81 mg Oral Daily 01/12/22, MD   81 mg at 01/10/22 0934   heparin injection 5,000 Units  5,000  Units Subcutaneous Q8H Rolly Salter, MD   5,000 Units at 01/10/22 8841   hydrALAZINE (APRESOLINE) injection 10 mg  10 mg Intravenous Q4H PRN Rolly Salter, MD   10 mg at 01/07/22 1940   insulin aspart (novoLOG) injection 0-15 Units  0-15 Units Subcutaneous TID WC Rolly Salter, MD   8 Units at 01/10/22 1142   insulin aspart (novoLOG) injection 0-5 Units  0-5 Units Subcutaneous QHS Rolly Salter, MD   2 Units at 01/09/22 2148   insulin glargine-yfgn (SEMGLEE) injection 15 Units  15 Units Subcutaneous QHS  Rolly Salter, MD   15 Units at 01/09/22 2149   LORazepam (ATIVAN) tablet 0.5 mg  0.5 mg Oral BID PRN Rolly Salter, MD       ondansetron [facility]) tablet 4 mg  4 mg Oral Q6H PRN Rolly Salter, MD       Or   ondansetron [facility]) injection 4 mg  4 mg Intravenous Q6H PRN Rolly Salter, MD       penicillin G potassium 12 Million Units in dextrose 5 % 500 mL continuous infusion  12 Million Units Intravenous Q12H Gardiner Barefoot, MD 41.7 mL/hr at 01/10/22 1022 12 Million Units at 01/10/22 1022   rosuvastatin (CRESTOR) tablet 20 mg  20 mg Oral QHS Rolly Salter, MD   20 mg at 01/09/22 2148   thiamine tablet 100 mg  100 mg Oral Daily Rolly Salter, MD   100 mg at 01/10/22 6606     Discharge Medications: Please see discharge summary for a list of discharge medications.  Relevant Imaging Results:  Relevant Lab Results:   Additional Information SS#[id number] [phone], Olegario Messier, [place]

## 2022-01-10 NOTE — Progress Notes (Signed)
Physical Therapy Treatment Patient Details Name: Mary Swanson MRN: [id number] DOB: 1956/04/26 Today's Date: 01/10/2022   History of Present Illness 66 yo female admitted with R foot infection, sepsis. Hx of dementia, DM    PT Comments    Mod encouragement and persuasion required on today. Family present and assisted with getting pt to participate. She tolerated activity well. She should be able to return to her facility upon discharge.    Recommendations for follow up therapy are one component of a multi-disciplinary discharge planning process, led by the attending physician.  Recommendations may be updated based on patient status, additional functional criteria and insurance authorization.  Follow Up Recommendations   (return to ALF/memory care)     Assistance Recommended at Discharge Frequent or constant Supervision/Assistance  Patient can return home with the following A little help with walking and/or transfers;A lot of help with bathing/dressing/bathroom   Equipment Recommendations  None recommended by PT    Recommendations for Other Services       Precautions / Restrictions Precautions Precautions: Fall Restrictions Weight Bearing Restrictions: No     Mobility  Bed Mobility Overal bed mobility: Needs Assistance Bed Mobility: Supine to Sit     Supine to sit: Min assist, HOB elevated     General bed mobility comments: Mod encouragment and persuasion required on today. Multimodal cues. Increased time.    Transfers Overall transfer level: Needs assistance Equipment used: Rolling walker (2 wheels) Transfers: Sit to/from Stand Sit to Stand: Min assist           General transfer comment: Assist to rise. Again, mod encouragement and persuasion required on today. Family assisted with encouraging pt which was helpful.    Ambulation/Gait Ambulation/Gait assistance: Min assist Gait Distance (Feet): 125 Feet Assistive device: Rolling walker (2 wheels) Gait  Pattern/deviations: Step-through pattern, Decreased stride length       General Gait Details: Assist to manage RW intermittently. Pt tolerated distance well.   Stairs             Wheelchair Mobility    Modified Rankin (Stroke Patients Only)       Balance Overall balance assessment: Needs assistance         Standing balance support: Bilateral upper extremity supported, Reliant on assistive device for balance, During functional activity Standing balance-Leahy Scale: Poor                              Cognition Arousal/Alertness: Awake/alert Behavior During Therapy: WFL for tasks assessed/performed Overall Cognitive Status: History of cognitive impairments - at baseline                                 General Comments: minimal conversaton. pt seems to have receptive and expressive deficits. easily distracted.        Exercises      General Comments        Pertinent Vitals/Pain Pain Assessment Pain Assessment: Faces Faces Pain Scale: No hurt    Home Living Family/patient expects to be discharged to:: Other (Comment) (back to [facility])                        Prior Function            PT Goals (current goals can now be found in the care plan section) Progress towards PT goals:  Progressing toward goals    Frequency    Min 2X/week      PT Plan Current plan remains appropriate    Co-evaluation              AM-PAC PT "6 Clicks" Mobility   Outcome Measure  Help needed turning from your back to your side while in a flat bed without using bedrails?: A Little Help needed moving from lying on your back to sitting on the side of a flat bed without using bedrails?: A Little Help needed moving to and from a bed to a chair (including a wheelchair)?: A Little Help needed standing up from a chair using your arms (e.g., wheelchair or bedside chair)?: A Little Help needed to walk in hospital room?: A  Little Help needed climbing 3-5 steps with a railing? : A Lot 6 Click Score: 17    End of Session Equipment Utilized During Treatment: Gait belt Activity Tolerance: Patient tolerated treatment well Patient left: in chair;with call bell/phone within reach;with chair alarm set;with family/visitor present   PT Visit Diagnosis: Muscle weakness (generalized) (M62.81);Difficulty in walking, not elsewhere classified (R26.2)     Time: 4481-8563 PT Time Calculation (min) (ACUTE ONLY): 16 min  Charges:  $Gait Training: 8-22 mins                        Faye Ramsay, PT Acute Rehabilitation  Office: [phone] Pager: [phone]

## 2022-01-10 NOTE — TOC Progression Note (Signed)
Transition of Care [facility]) - Progression Note    Patient Details  Name: Mary Swanson MRN: [id number] Date of Birth: May 18, 1956  [facility] CM/SW Contact  Tiasia Weberg, Juliann Pulse, RN Phone Number: 01/10/2022, 12:42 PM  Clinical Narrative: Damaris Schooner to [place] [id number] plan to return back to [facility] care.fl2 faxed to [facility] rep @ [address]     Expected Discharge Plan: Memory Care Barriers to Discharge: Continued Medical Work up  Expected Discharge Plan and Services Expected Discharge Plan: Memory Care   Discharge Planning Services: CM Consult Post Acute Care Choice:  (Memory Care) Living arrangements for the past 2 months: [place] (Memory Care)                                       Social Determinants of Health (SDOH) Interventions    Readmission Risk Interventions No flowsheet data found.

## 2022-01-10 NOTE — Progress Notes (Signed)
Inpatient Diabetes Program Recommendations  AACE/ADA: New Consensus Statement on Inpatient Glycemic Control (2015)  Target Ranges:  Prepandial:   less than 140 mg/dL      Peak postprandial:   less than 180 mg/dL (1-2 hours)      Critically ill patients:  140 - 180 mg/dL   Lab Results  Component Value Date   GLUCAP 235 (H) 01/10/2022   HGBA1C 7.9 (H) 01/08/2022    Review of Glycemic Control  Latest Reference Range & Units 01/09/22 17:20 01/09/22 21:39 01/10/22 07:43  Glucose-Capillary 70 - 99 mg/dL 465 (H) 681 (H) 275 (H)  (H): Data is abnormally high   Home DM Meds: Lantus 30 units QHS   Humalog 0-14 units TID per SSI   Metformin 1000 mg daily      Current Orders: Semglee 15 units QHS               Novolog 0-15 units TID ac/hs      Consider increasing Semglee to 20 units QHS and also adding Novolog Meal Coverage: Novolog 3 units TID with meals HOLD if pt eats <50% meals.  Thanks, Lujean Rave, MSN, RNC-OB Diabetes Coordinator [phone] (8a-5p)

## 2022-01-10 NOTE — Progress Notes (Signed)
Progress Note   Patient: Mary Swanson [id number] DOB: 01-03-1956 DOA: 01/06/2022     Hospitalization day: 4 DOS: the patient was seen and examined on 01/10/2022   Brief hospital course: 66 year old female with past medical history of Alzheimer's dementia, type II DM, HTN, HLD presented with tachycardia and fever.  Found to have right foot infection. 2/10 admitted for diabetic foot infection. 2/11 blood cultures positive for strep positives.  Mentation improving but not back to baseline. 2/12 podiatry consulted, ID consulted.  Conservative management recommended. 2/13 antibiotic changed to penicillin.   Assessment and Plan: * Sepsis due to Streptococcus pyogenes due to right foot cellulitis- (present on admission) Meeting SIRS criteria on admission with encephalopathy, tachypnea, tachycardia, fever. Currently sepsis physiology is resolving. Lactic acid is normal. Procalcitonin level 6.57. CRP 25.7. Source of infection right foot cellulitis. Initially started on IV vancomycin, ceftriaxone and Flagyl. Blood culture came back positive for strep pyogenes. MRI negative for osteomyelitis but does show evidence of microabscesses. ID consulted. Appreciate assistance.  Now on IV penicillin. Podiatry consulted, currently recommending close observation and conservative management.,  No surgery indicated based on my conversation on 2/14.  AKI (acute kidney injury) ([place])- (present on admission) Baseline serum creatinine 0.67.  On presentation serum creatinine 1.21 elevated BUN as well. Improved with IV hydration.  Alzheimer's dementia with behavioral disturbance ([name])- (present on admission) Patient is on Seroquel and Ativan.  Resume home regimen.  Acute metabolic encephalopathy- (present on admission) Resolved. At baseline patient is pleasantly confused but able to converse coherently. Currently incoherent speech.  Very rarely able to follow commands. CT of the head unremarkable.  MRI  brain also negative for any acute abnormality. Metabolic work-up otherwise unremarkable including ammonia level, B12, TSH.  Transaminitis- (present on admission) Resolved. In the setting of sepsis.  Essential hypertension- (present on admission) Blood pressure mildly elevated. Continue hydralazine as needed.  On Norvasc at home. Will resume.  Uncontrolled type 2 diabetes mellitus with hyperglycemia, with long-term current use of insulin with hyperlipidemia Hemoglobin A1c 7.9. Continue sliding scale insulin. Increase Lantus and hold metformin.  Hyperlipidemia- (present on admission) Continue Crestor.  Obesity (BMI 30-39.9)- (present on admission) Body mass index is 28.5 kg/m.  Placing the patient at high risk of poor outcome.   Subjective: No nausea no vomiting no fever no chills.  No chest pain abdominal pain.  Continues to have confusion with inappropriate laughter.  Physical Exam: Vitals:   01/10/22 0805 01/10/22 1318 01/10/22 1321 01/10/22 1331  BP: (!) 164/82 (!) 154/140 (!) 145/76 (!) 152/78  Pulse: 64 85    Resp:  18    Temp: 98.4 F (36.9 C) 98.3 F (36.8 C)    TempSrc: Oral Axillary    SpO2: 95%     Weight:      Height:       General: Appear in mild distress; no visible Abnormal Neck Mass Or lumps, Conjunctiva normal Cardiovascular: S1 and S2 Present, no Murmur, Respiratory: good respiratory effort, Bilateral Air entry present and CTA, no Crackles, no wheezes Abdomen: Bowel Sound present Extremities: trace Pedal edema Neurology: alert and not oriented to time, place, and person Gait not checked due to patient safety concerns   Data Reviewed:  I have Reviewed nursing notes, Vitals, and Lab results since pt's last encounter. Pertinent lab results CBC CMP I have ordered test including CBC and BMP and CRP I have reviewed the last note from infectious disease,  I have discussed pt's care plan and test results with  podiatry.   Family Communication: None at  bedside, unable to discuss with the family care plan on 2/14.  Last discussion 2/13.  Disposition: Status is: Inpatient Remains inpatient appropriate because: Requiring IV antibiotic.  If does not require surgery potentially can go back to [facility] on 2/15.  Author: Berle Mull, MD 01/10/2022 8:24 PM  For on call review [email or web address].

## 2022-01-10 NOTE — Progress Notes (Signed)
[facility]   Reason for visit: follow up on right foot cellulitis  Interval History: remains afebrile; WBC near normal at 11.  No new complaints.   Day 5 total antibiotics  Physical Exam: Constitutional:  Vitals:   01/10/22 0457 01/10/22 0805  BP: (!) 163/69 (!) 164/82  Pulse: 63 64  Resp: 17   Temp: (!) 97.3 F (36.3 C) 98.4 F (36.9 C)  SpO2: 96% 95%  She appears in NAD Respiratory: Normal respiratory effort Cardiovascular: RRR MS: right foot with no erythema noted now  Review of Systems: Unable to assess due to dementia  Lab Results  Component Value Date   WBC 11.0 (H) 01/10/2022   HGB 9.4 (L) 01/10/2022   HCT 29.9 (L) 01/10/2022   MCV 96.8 01/10/2022   PLT 268 01/10/2022    Lab Results  Component Value Date   CREATININE 1.01 (H) 01/10/2022   BUN 17 01/10/2022   NA 135 01/10/2022   K 3.8 01/10/2022   CL 105 01/10/2022   CO2 26 01/10/2022    Lab Results  Component Value Date   ALT 37 01/10/2022   AST 30 01/10/2022   GGT 160 (H) 01/07/2022   ALKPHOS 99 01/10/2022     Microbiology: Recent Results (from the past 240 hour(s))  Blood Culture (routine x 2)     Status: Abnormal   Collection Time: 01/06/22  1:41 PM   Specimen: BLOOD  Result Value Ref Range Status   Specimen Description   Final    BLOOD RIGHT ANTECUBITAL Performed at [facility], [address], [place], [place] [address]    Special Requests   Final    BOTTLES DRAWN AEROBIC AND ANAEROBIC Blood Culture results may not be optimal due to an inadequate volume of blood received in culture bottles Performed at [facility], [address], [place], [place] [address]    Culture  Setup Time   Final    GRAM POSITIVE COCCI AEROBIC BOTTLE ONLY CRITICAL VALUE NOTED.  VALUE IS CONSISTENT WITH PREVIOUSLY REPORTED AND CALLED VALUE.    Culture (A)  Final    STREPTOCOCCUS PYOGENES SUSCEPTIBILITIES PERFORMED ON PREVIOUS CULTURE  WITHIN THE LAST 5 DAYS. HEALTH DEPARTMENT NOTIFIED Performed at [facility], [address] [place], [place] [address]    Report Status 01/09/2022 FINAL  Final  Urine Culture     Status: None   Collection Time: 01/06/22  1:41 PM   Specimen: In/Out Cath Urine  Result Value Ref Range Status   Specimen Description   Final    IN/OUT CATH URINE Performed at [facility], [address] [place], [place] [address]    Special Requests   Final    NONE Performed at [facility], [address] [place], [place] [address]    Culture   Final    NO GROWTH Performed at [facility], [address], [place], [place] [address]    Report Status 01/07/2022 FINAL  Final  Blood Culture (routine x 2)     Status: Abnormal   Collection Time: 01/06/22  1:46 PM   Specimen: BLOOD  Result Value Ref Range Status   Specimen Description   Final    BLOOD RIGHT ANTECUBITAL Performed at [facility], [address], [place], [place] [address]    Special Requests   Final    BOTTLES DRAWN AEROBIC AND ANAEROBIC Blood Culture results may not be  optimal due to an inadequate volume of blood received in culture bottles Performed at [facility], [address] [place], [place] [address]    Culture  Setup Time   Final    GRAM POSITIVE COCCI IN BOTH AEROBIC AND ANAEROBIC BOTTLES CRITICAL RESULT CALLED TO, READ BACK BY AND VERIFIED WITH: Loura Back S.CHRISTY ON [id number] AT 1100 BY E.PARRISH    Culture (A)  Final    STREPTOCOCCUS PYOGENES HEALTH DEPARTMENT NOTIFIED Performed at [facility], [address] [place], [place] [address]    Report Status 01/09/2022 FINAL  Final   Organism ID, Bacteria STREPTOCOCCUS PYOGENES  Final      Susceptibility   Streptococcus pyogenes - MIC*    PENICILLIN <=0.06 SENSITIVE Sensitive     CEFTRIAXONE <=0.12 SENSITIVE Sensitive     ERYTHROMYCIN <=0.12 SENSITIVE Sensitive      LEVOFLOXACIN 0.5 SENSITIVE Sensitive     VANCOMYCIN 0.5 SENSITIVE Sensitive     * STREPTOCOCCUS PYOGENES  Blood Culture ID Panel (Reflexed)     Status: Abnormal   Collection Time: 01/06/22  1:46 PM  Result Value Ref Range Status   Enterococcus faecalis NOT DETECTED NOT DETECTED Final   Enterococcus Faecium NOT DETECTED NOT DETECTED Final   Listeria monocytogenes NOT DETECTED NOT DETECTED Final   Staphylococcus species NOT DETECTED NOT DETECTED Final   Staphylococcus aureus (BCID) NOT DETECTED NOT DETECTED Final   Staphylococcus epidermidis NOT DETECTED NOT DETECTED Final   Staphylococcus lugdunensis NOT DETECTED NOT DETECTED Final   Streptococcus species DETECTED (A) NOT DETECTED Final    Comment: CRITICAL RESULT CALLED TO, READ BACK BY AND VERIFIED WITH: PHARM D S.CHRISTY ON [id number] AT 1100 BY E.PARRISH    Streptococcus agalactiae NOT DETECTED NOT DETECTED Final   Streptococcus pneumoniae NOT DETECTED NOT DETECTED Final   Streptococcus pyogenes DETECTED (A) NOT DETECTED Final    Comment: CRITICAL RESULT CALLED TO, READ BACK BY AND VERIFIED WITH: PHARM D S.CHRISTY ON [id number] AT 1100 BY E.PARRISH    A.calcoaceticus-baumannii NOT DETECTED NOT DETECTED Final   Bacteroides fragilis NOT DETECTED NOT DETECTED Final   Enterobacterales NOT DETECTED NOT DETECTED Final   Enterobacter cloacae complex NOT DETECTED NOT DETECTED Final   Escherichia coli NOT DETECTED NOT DETECTED Final   Klebsiella aerogenes NOT DETECTED NOT DETECTED Final   Klebsiella oxytoca NOT DETECTED NOT DETECTED Final   Klebsiella pneumoniae NOT DETECTED NOT DETECTED Final   Proteus species NOT DETECTED NOT DETECTED Final   Salmonella species NOT DETECTED NOT DETECTED Final   Serratia marcescens NOT DETECTED NOT DETECTED Final   Haemophilus influenzae NOT DETECTED NOT DETECTED Final   Neisseria meningitidis NOT DETECTED NOT DETECTED Final   Pseudomonas aeruginosa NOT DETECTED NOT DETECTED Final   Stenotrophomonas  maltophilia NOT DETECTED NOT DETECTED Final   Candida albicans NOT DETECTED NOT DETECTED Final   Candida auris NOT DETECTED NOT DETECTED Final   Candida glabrata NOT DETECTED NOT DETECTED Final   Candida krusei NOT DETECTED NOT DETECTED Final   Candida parapsilosis NOT DETECTED NOT DETECTED Final   Candida tropicalis NOT DETECTED NOT DETECTED Final   Cryptococcus neoformans/gattii NOT DETECTED NOT DETECTED Final    Comment: Performed at [facility], [address], [place], [place] [address]  Resp Panel by RT-PCR (Flu A&B, Covid) Nasopharyngeal Swab     Status: None   Collection Time: 01/06/22  2:09 PM   Specimen: Nasopharyngeal Swab; Nasopharyngeal(NP) swabs in vial transport medium  Result Value Ref Range  Status   SARS Coronavirus 2 by RT PCR NEGATIVE NEGATIVE Final    Comment: (NOTE) SARS-CoV-2 target nucleic acids are NOT DETECTED.  The SARS-CoV-2 RNA is generally detectable in upper respiratory specimens during the acute phase of infection. The lowest concentration of SARS-CoV-2 viral copies this assay can detect is 138 copies/mL. A negative result does not preclude SARS-Cov-2 infection and should not be used as the sole basis for treatment or other patient management decisions. A negative result may occur with  improper specimen collection/handling, submission of specimen other than nasopharyngeal swab, presence of viral mutation(s) within the areas targeted by this assay, and inadequate number of viral copies(<138 copies/mL). A negative result must be combined with clinical observations, patient history, and epidemiological information. The expected result is Negative.  Fact Sheet for Patients:  BloggerCourse.comhttps://www.fda.gov/media/152166/download  Fact Sheet for Healthcare Providers:  SeriousBroker.ithttps://www.fda.gov/media/152162/download  This test is no t yet approved or cleared by the [place] FDA and  has been authorized for detection and/or diagnosis of SARS-CoV-2 by FDA under  an Emergency Use Authorization (EUA). This EUA will remain  in effect (meaning this test can be used) for the duration of the COVID-19 declaration under Section 564(b)(1) of the Act, 21 U.S.C.section 360bbb-3(b)(1), unless the authorization is terminated  or revoked sooner.       Influenza A by PCR NEGATIVE NEGATIVE Final   Influenza B by PCR NEGATIVE NEGATIVE Final    Comment: (NOTE) The Xpert Xpress SARS-CoV-2/FLU/RSV plus assay is intended as an aid in the diagnosis of influenza from Nasopharyngeal swab specimens and should not be used as a sole basis for treatment. Nasal washings and aspirates are unacceptable for Xpert Xpress SARS-CoV-2/FLU/RSV testing.  Fact Sheet for Patients: BloggerCourse.comhttps://www.fda.gov/media/152166/download  Fact Sheet for Healthcare Providers: SeriousBroker.ithttps://www.fda.gov/media/152162/download  This test is not yet approved or cleared by the [place] FDA and has been authorized for detection and/or diagnosis of SARS-CoV-2 by FDA under an Emergency Use Authorization (EUA). This EUA will remain in effect (meaning this test can be used) for the duration of the COVID-19 declaration under Section 564(b)(1) of the Act, 21 U.S.C. section 360bbb-3(b)(1), unless the authorization is terminated or revoked.  Performed at [facility], [address] [place], [place] [address]   Culture, blood (routine x 2)     Status: None (Preliminary result)   Collection Time: 01/08/22  7:29 AM   Specimen: BLOOD  Result Value Ref Range Status   Specimen Description   Final    BLOOD BLOOD RIGHT FOREARM Performed at [facility], [address] [place], [place] [address]    Special Requests   Final    BOTTLES DRAWN AEROBIC ONLY Blood Culture adequate volume Performed at [facility], [address] [place], [place] [address]    Culture   Final    NO GROWTH 2 DAYS Performed at [facility], [address]  [place], [place] [address]    Report Status PENDING  Incomplete  Culture, blood (routine x 2)     Status: None (Preliminary result)   Collection Time: 01/08/22  7:29 AM   Specimen: BLOOD  Result Value Ref Range Status   Specimen Description   Final    BLOOD RIGHT ANTECUBITAL Performed at [facility], [address] [place], [place] [address]    Special Requests   Final    BOTTLES DRAWN AEROBIC ONLY Blood Culture adequate volume Performed at [facility], [address]  [place], [place] [address]    Culture   Final    NO GROWTH 2 DAYS Performed at [facility], [address], [place], [place] [address]    Report Status PENDING  Incomplete    Impression/Plan:  1. Soft tissue infection - group A Strep in blood cultures and on penicillin.  Improving clinically.  Podiatry following for potential surgical needs.   Will continue to follow  2. Acute kidney injury - prerenal and creat continues to slowly improve.  Will continue to monitor.   3. Leukocytosis - continues to trend down.  Will continue to monitor.

## 2022-01-11 ENCOUNTER — Encounter (HOSPITAL_COMMUNITY)
Admission: EM | Disposition: A | Discharge status: Nursing Home (Includes ICF) | Payer: Self-pay | Source: Skilled Nursing Facility | Attending: Internal Medicine

## 2022-01-11 DIAGNOSIS — L089 Local infection of the skin and subcutaneous tissue, unspecified: Secondary | ICD-10-CM

## 2022-01-11 DIAGNOSIS — E11628 Type 2 diabetes mellitus with other skin complications: Secondary | ICD-10-CM

## 2022-01-11 LAB — C-REACTIVE PROTEIN: CRP: 4.2 mg/dL — ABNORMAL HIGH (ref <1.0)

## 2022-01-11 LAB — CBC
HCT: 29.5 % — ABNORMAL LOW (ref 36.0–46.0)
Hemoglobin: 9.2 g/dL — ABNORMAL LOW (ref 12.0–15.0)
MCH: 31.1 pg (ref 26.0–34.0)
MCHC: 31.2 g/dL (ref 30.0–36.0)
MCV: 99.7 fL (ref 80.0–100.0)
Platelets: 287 10*3/uL (ref 150–400)
RBC: 2.96 MIL/uL — ABNORMAL LOW (ref 3.87–5.11)
RDW: 12.3 % (ref 11.5–15.5)
WBC: 10.1 10*3/uL (ref 4.0–10.5)
nRBC: 0.2 % (ref 0.0–0.2)

## 2022-01-11 LAB — BASIC METABOLIC PANEL
Anion gap: 7 (ref 5–15)
BUN: 16 mg/dL (ref 8–23)
CO2: 25 mmol/L (ref 22–32)
Calcium: 8.6 mg/dL — ABNORMAL LOW (ref 8.9–10.3)
Chloride: 104 mmol/L (ref 98–111)
Creatinine, Ser: 0.94 mg/dL (ref 0.44–1.00)
GFR, Estimated: >60 mL/min (ref >60)
Glucose, Bld: 299 mg/dL — ABNORMAL HIGH (ref 70–99)
Potassium: 3.9 mmol/L (ref 3.5–5.1)
Sodium: 136 mmol/L (ref 135–145)

## 2022-01-11 LAB — GLUCOSE, CAPILLARY
Glucose-Capillary: 264 mg/dL — ABNORMAL HIGH (ref 70–99)
Glucose-Capillary: 308 mg/dL — ABNORMAL HIGH (ref 70–99)
Glucose-Capillary: 321 mg/dL — ABNORMAL HIGH (ref 70–99)
Glucose-Capillary: 249 mg/dL — ABNORMAL HIGH (ref 70–99)

## 2022-01-11 LAB — VITAMIN B1: Vitamin B1 (Thiamine): 292.2 nmol/L — ABNORMAL HIGH (ref 66.5–200.0)

## 2022-01-11 SURGERY — INCISION AND DRAINAGE
Anesthesia: General | Laterality: Right

## 2022-01-11 MED ORDER — INSULIN GLARGINE-YFGN 100 UNIT/ML ~~LOC~~ SOLN
30.0000 [IU] | Freq: Every day | SUBCUTANEOUS | Status: DC
  Administered 2022-01-11: 30 [IU] via SUBCUTANEOUS
  Filled 2022-01-11 (×3): qty 0.3

## 2022-01-11 MED ORDER — INSULIN ASPART 100 UNIT/ML IJ SOLN
3.0000 [IU] | Freq: Three times a day (TID) | INTRAMUSCULAR | Status: DC
  Administered 2022-01-11 – 2022-01-12 (×3): 3 [IU] via SUBCUTANEOUS

## 2022-01-11 NOTE — Progress Notes (Signed)
[facility]   Reason for visit: Follow up on bacteremia  Interval History: repeat blood cultures remain ngtd.  WBC wnl, remains afebrile since her initial fever on admission.   Day 6 total antibiotics  Physical Exam: Constitutional:  Vitals:   01/10/22 2043 01/11/22 0510  BP: (!) 151/60 (!) 153/82  Pulse: 66 60  Resp: 18 18  Temp: 99.3 F (37.4 C) 98.6 F (37 C)  SpO2: 98% 99%   patient appears in NAD Respiratory: Normal respiratory effort; CTA B Cardiovascular: RRR GI: soft, nt, nd  Review of Systems: Constitutional: negative for fevers and chills Gastrointestinal: negative for nausea and diarrhea  Lab Results  Component Value Date   WBC 10.1 01/11/2022   HGB 9.2 (L) 01/11/2022   HCT 29.5 (L) 01/11/2022   MCV 99.7 01/11/2022   PLT 287 01/11/2022    Lab Results  Component Value Date   CREATININE 0.94 01/11/2022   BUN 16 01/11/2022   NA 136 01/11/2022   K 3.9 01/11/2022   CL 104 01/11/2022   CO2 25 01/11/2022    Lab Results  Component Value Date   ALT 37 01/10/2022   AST 30 01/10/2022   GGT 160 (H) 01/07/2022   ALKPHOS 99 01/10/2022     Microbiology: Recent Results (from the past 240 hour(s))  Blood Culture (routine x 2)     Status: Abnormal   Collection Time: 01/06/22  1:41 PM   Specimen: BLOOD  Result Value Ref Range Status   Specimen Description   Final    BLOOD RIGHT ANTECUBITAL Performed at [facility], [place] [address], [place], [place] [address]    Special Requests   Final    BOTTLES DRAWN AEROBIC AND ANAEROBIC Blood Culture results may not be optimal due to an inadequate volume of blood received in culture bottles Performed at [place] [address], [place], [place] [address]    Culture  Setup Time   Final    GRAM POSITIVE COCCI AEROBIC BOTTLE ONLY CRITICAL VALUE NOTED.  VALUE IS CONSISTENT WITH PREVIOUSLY REPORTED AND CALLED VALUE.    Culture (A)  Final     STREPTOCOCCUS PYOGENES SUSCEPTIBILITIES PERFORMED ON PREVIOUS CULTURE WITHIN THE LAST 5 DAYS. HEALTH DEPARTMENT NOTIFIED Performed at [facility], [place] [address] [place], [place] [address]    Report Status 01/09/2022 FINAL  Final  Urine Culture     Status: None   Collection Time: 01/06/22  1:41 PM   Specimen: In/Out Cath Urine  Result Value Ref Range Status   Specimen Description   Final    IN/OUT CATH URINE Performed at [name] [address], [place], [place] [address]    Special Requests   Final    NONE Performed at [facility], [place] [address] [place], [place] [address]    Culture   Final    NO GROWTH Performed at [facility], [place] [address] [place], [place] [address]    Report Status 01/07/2022 FINAL  Final  Blood Culture (routine x 2)     Status: Abnormal   Collection Time: 01/06/22  1:46 PM   Specimen: BLOOD  Result Value Ref Range Status   Specimen Description   Final    BLOOD RIGHT ANTECUBITAL Performed at [place] [address], [place], [place] [address]    Special Requests   Final    BOTTLES DRAWN AEROBIC AND ANAEROBIC Blood  Culture results may not be optimal due to an inadequate volume of blood received in culture bottles Performed at [place] [address], [place], [place] [address]    Culture  Setup Time   Final    GRAM POSITIVE COCCI IN BOTH AEROBIC AND ANAEROBIC BOTTLES CRITICAL RESULT CALLED TO, READ BACK BY AND VERIFIED WITH: Vena Austria S.CHRISTY ON FU:[id number] AT 1100 BY E.PARRISH    Culture (A)  Final    STREPTOCOCCUS PYOGENES HEALTH DEPARTMENT NOTIFIED Performed at [facility], [place] [address] [place], [place] [address]    Report Status 01/09/2022 FINAL  Final   Organism ID, Bacteria STREPTOCOCCUS PYOGENES  Final      Susceptibility   Streptococcus pyogenes - MIC*    PENICILLIN <=0.06 SENSITIVE Sensitive     CEFTRIAXONE <=0.12  SENSITIVE Sensitive     ERYTHROMYCIN <=0.12 SENSITIVE Sensitive     LEVOFLOXACIN 0.5 SENSITIVE Sensitive     VANCOMYCIN 0.5 SENSITIVE Sensitive     * STREPTOCOCCUS PYOGENES  Blood Culture ID Panel (Reflexed)     Status: Abnormal   Collection Time: 01/06/22  1:46 PM  Result Value Ref Range Status   Enterococcus faecalis NOT DETECTED NOT DETECTED Final   Enterococcus Faecium NOT DETECTED NOT DETECTED Final   Listeria monocytogenes NOT DETECTED NOT DETECTED Final   Staphylococcus species NOT DETECTED NOT DETECTED Final   Staphylococcus aureus (BCID) NOT DETECTED NOT DETECTED Final   Staphylococcus epidermidis NOT DETECTED NOT DETECTED Final   Staphylococcus lugdunensis NOT DETECTED NOT DETECTED Final   Streptococcus species DETECTED (A) NOT DETECTED Final    Comment: CRITICAL RESULT CALLED TO, READ BACK BY AND VERIFIED WITH: PHARM D S.CHRISTY ON [id number] AT 1100 BY E.PARRISH    Streptococcus agalactiae NOT DETECTED NOT DETECTED Final   Streptococcus pneumoniae NOT DETECTED NOT DETECTED Final   Streptococcus pyogenes DETECTED (A) NOT DETECTED Final    Comment: CRITICAL RESULT CALLED TO, READ BACK BY AND VERIFIED WITH: PHARM D S.CHRISTY ON [id number] AT 1100 BY E.PARRISH    A.calcoaceticus-baumannii NOT DETECTED NOT DETECTED Final   Bacteroides fragilis NOT DETECTED NOT DETECTED Final   Enterobacterales NOT DETECTED NOT DETECTED Final   Enterobacter cloacae complex NOT DETECTED NOT DETECTED Final   Escherichia coli NOT DETECTED NOT DETECTED Final   Klebsiella aerogenes NOT DETECTED NOT DETECTED Final   Klebsiella oxytoca NOT DETECTED NOT DETECTED Final   Klebsiella pneumoniae NOT DETECTED NOT DETECTED Final   Proteus species NOT DETECTED NOT DETECTED Final   Salmonella species NOT DETECTED NOT DETECTED Final   Serratia marcescens NOT DETECTED NOT DETECTED Final   Haemophilus influenzae NOT DETECTED NOT DETECTED Final   Neisseria meningitidis NOT DETECTED NOT DETECTED Final    Pseudomonas aeruginosa NOT DETECTED NOT DETECTED Final   Stenotrophomonas maltophilia NOT DETECTED NOT DETECTED Final   Candida albicans NOT DETECTED NOT DETECTED Final   Candida auris NOT DETECTED NOT DETECTED Final   Candida glabrata NOT DETECTED NOT DETECTED Final   Candida krusei NOT DETECTED NOT DETECTED Final   Candida parapsilosis NOT DETECTED NOT DETECTED Final   Candida tropicalis NOT DETECTED NOT DETECTED Final   Cryptococcus neoformans/gattii NOT DETECTED NOT DETECTED Final    Comment: Performed at [facility], [place]. [address] [place], [place] [address]  Resp Panel by RT-PCR (Flu A&B, Covid) Nasopharyngeal Swab     Status: None   Collection Time: 01/06/22  2:09 PM   Specimen: Nasopharyngeal Swab; Nasopharyngeal(NP) swabs in vial transport medium  Result Value Ref Range Status   SARS Coronavirus 2 by RT PCR NEGATIVE NEGATIVE Final    Comment: (NOTE) SARS-CoV-2 target nucleic acids are NOT DETECTED.  The SARS-CoV-2 RNA is generally detectable in upper respiratory specimens during the acute phase of infection. The lowest concentration of SARS-CoV-2 viral copies this assay can detect is 138 copies/mL. A negative result does not preclude SARS-Cov-2 infection and should not be used as the sole basis for treatment or other patient management decisions. A negative result may occur with  improper specimen collection/handling, submission of specimen other than nasopharyngeal swab, presence of viral mutation(s) within the areas targeted by this assay, and inadequate number of viral copies(<138 copies/mL). A negative result must be combined with clinical observations, patient history, and epidemiological information. The expected result is Negative.  Fact Sheet for Patients:  BloggerCourse.com  Fact Sheet for Healthcare Providers:  SeriousBroker.it  This test is no t yet approved or cleared by the [place] FDA and  has  been authorized for detection and/or diagnosis of SARS-CoV-2 by FDA under an Emergency Use Authorization (EUA). This EUA will remain  in effect (meaning this test can be used) for the duration of the COVID-19 declaration under Section 564(b)(1) of the Act, 21 U.S.C.section 360bbb-3(b)(1), unless the authorization is terminated  or revoked sooner.       Influenza A by PCR NEGATIVE NEGATIVE Final   Influenza B by PCR NEGATIVE NEGATIVE Final    Comment: (NOTE) The Xpert Xpress SARS-CoV-2/FLU/RSV plus assay is intended as an aid in the diagnosis of influenza from Nasopharyngeal swab specimens and should not be used as a sole basis for treatment. Nasal washings and aspirates are unacceptable for Xpert Xpress SARS-CoV-2/FLU/RSV testing.  Fact Sheet for Patients: BloggerCourse.com  Fact Sheet for Healthcare Providers: SeriousBroker.it  This test is not yet approved or cleared by the [place] FDA and has been authorized for detection and/or diagnosis of SARS-CoV-2 by FDA under an Emergency Use Authorization (EUA). This EUA will remain in effect (meaning this test can be used) for the duration of the COVID-19 declaration under Section 564(b)(1) of the Act, 21 U.S.C. section 360bbb-3(b)(1), unless the authorization is terminated or revoked.  Performed at [facility], [address], [place], [place] [address]   Culture, blood (routine x 2)     Status: None (Preliminary result)   Collection Time: 01/08/22  7:29 AM   Specimen: BLOOD  Result Value Ref Range Status   Specimen Description   Final    BLOOD BLOOD RIGHT FOREARM Performed at [facility], [address], [place], [place] [address]    Special Requests   Final    BOTTLES DRAWN AEROBIC ONLY Blood Culture adequate volume Performed at [facility], [address] [place], [place] [address]    Culture   Final    NO  GROWTH 3 DAYS Performed at [facility], [address] [place], [place] [address]    Report Status PENDING  Incomplete  Culture, blood (routine x 2)     Status: None (Preliminary result)   Collection Time: 01/08/22  7:29 AM   Specimen: BLOOD  Result Value Ref Range Status   Specimen Description   Final    BLOOD RIGHT ANTECUBITAL Performed at [facility], [address], [place], [place] [address]    Special Requests   Final    BOTTLES DRAWN AEROBIC ONLY Blood Culture adequate volume Performed at [facility],  [place] [address], [place], [place] [address]    Culture   Final    NO GROWTH 3 DAYS Performed at [facility], [place] [address] [place], [place] [address]    Report Status PENDING  Incomplete    Impression/Plan:  1. Right foot cellulitis - soft tissue edema, trace tenosynovitis and possible early abscess.  Blood culture with GAS and on penicillin.  No surgery planned by podiatry.  CRP trending down.  Will continue with antibiotics two more weeks through February 28th and can use oral amoxicillin 500 mg three times a day   2.  Bacteremia - GAS and repeat cultures without any growth.  Source from #1.    3.  Acute kidney injury - creatinine now wnl.    I will sign off, call with any questions

## 2022-01-11 NOTE — Progress Notes (Signed)
PT Cancellation Note  Patient Details Name: Mary Swanson MRN: [id number] DOB: 1956-03-01   Cancelled Treatment:    Reason Eval/Treat Not Completed: Other (comment) (eating lunch). Pt eating lunch in bed, will check back as schedule permits.     Domenick Bookbinder PT, DPT 01/11/22, 12:16 PM

## 2022-01-11 NOTE — Progress Notes (Signed)
PODIATRY CONSULTATION  NAME Mary Swanson MRN [id number] DOB 04/01/1956 DOA 01/06/2022   Reason for consult: Abscess/cellulitis RT foot Chief Complaint  Patient presents with   Altered Mental Status   Code Sepsis    Consulting physician: Berle Mull, MD  History of present illness: 66 y.o. female very pleasant PMHx advanced dementia, diabetes mellitus type 2, HTN, admitted for severe sepsis secondary to right foot infection.  Patient states she is comfortable.  She is denying any foot pain today she is a diabetic with last A1c of 7.9%.  Her white count is trending down.  The redness is improving.  Past Medical History:  Diagnosis Date   Acute lower UTI [id number]   Acute metabolic encephalopathy [id number]   Diabetes mellitus ([place]) 12/28/2019   Diabetes mellitus without complication ([place])    Hypertension    Memory loss of unknown cause 12/03/2019    CBC Latest Ref Rng & Units 01/11/2022 01/10/2022 01/09/2022  WBC 4.0 - 10.5 K/uL 10.1 11.0(H) 15.9(H)  Hemoglobin 12.0 - 15.0 g/dL 9.2(L) 9.4(L) 10.6(L)  Hematocrit 36.0 - 46.0 % 29.5(L) 29.9(L) 33.4(L)  Platelets 150 - 400 K/uL 287 268 274    BMP Latest Ref Rng & Units 01/11/2022 01/10/2022 01/09/2022  Glucose 70 - 99 mg/dL 299(H) 233(H) 219(H)  BUN 8 - 23 mg/dL 16 17 19   Creatinine 0.44 - 1.00 mg/dL 0.94 1.01(H) 1.02(H)  BUN/Creat Ratio 12 - 28 - - -  Sodium 135 - 145 mmol/L 136 135 138  Potassium 3.5 - 5.1 mmol/L 3.9 3.8 3.6  Chloride 98 - 111 mmol/L 104 105 105  CO2 22 - 32 mmol/L 25 26 26   Calcium 8.9 - 10.3 mg/dL 8.6(L) 8.7(L) 8.9      Physical Exam: General: The patient is alert and oriented x3 in no acute distress.   Dermatology: No open wounds noted. Hyperkeratotic callus noted to the plantar 5th MTP joint, however there is no open wound. No interdigital maceration.  The redness has improved.  No area of fluctuance noted.  Unable to appreciate abscess.  Vascular: Palpable pedal pulses bilaterally. Erythema and edema  noted most prevalent around the first ray and medial aspect of the forefoot.  Neurological: Epicritic and protective threshold diminished   Musculoskeletal Exam: No structural deformity noted. There is tenderness to palpation of the forefoot. No crepitus.   XR FOOT RT 01/06/2022: FINDINGS: There is no evidence of fracture or dislocation. There is no evidence of arthropathy or other focal bone abnormality. Subcutaneus soft tissue edema of the forefoot/midfoot dorsum. Vascular calcifications.   IMPRESSION: Negative.  MR FOOT RT WO CONTRAST 01/07/2022: IMPRESSION: 1. No evidence of acute osteomyelitis of the right forefoot. 2. Circumferential soft tissue edema. Small areas of slightly more confluent fluid within the plantar-medial soft tissues underlying the first metatarsal concerning for small developing abscesses. 3. Trace tenosynovitis of the flexor hallucis longus tendon at the level of the great toe proximal phalanx. 4. Diffuse edema-like signal throughout the foot musculature may represent changes related to denervation or myositis.  ASSESSMENT/PLAN OF CARE 1.  Sepsis secondary to right foot infection -MRI was reviewed.  At the moment there is no open wound and there are very small questionable abscesses developing along the first MTP plantar aspect. - Clinically the redness has resolved.  For now defer further management of antibiotics to infectious disease -I am hopeful that patient can be treated with antibiotics alone. -I will hold off on any surgery -No wound care needed at this time as patient  does not have any wounds. -We will sign off please reconsult [place] as needed.

## 2022-01-11 NOTE — Progress Notes (Signed)
Progress Note   Patient: Mary Swanson [id number] DOB: May 25, 1956 DOA: 01/06/2022     Hospitalization day: 5 DOS: the patient was seen and examined on 01/11/2022   Brief hospital course: This 66 year old female with PMH  significant of Alzheimer's dementia, type II DM, HTN, HLD presented with tachycardia and fever. She is found to have right foot infection. 2/10/ 23  Admitted for diabetic foot infection. 2/11/ 23  blood cultures positive for strep positives.  Mentation improving but not back to baseline. 2/12/ 23  podiatry consulted, ID consulted.  Conservative management recommended. 2/13 / 23 antibiotic changed to penicillin.  Podiatry and ID signed off.  Needs 2 weeks of antibiotics.                  Assessment and Plan: * Sepsis due to Streptococcus pyogenes due to right foot cellulitis- (present on admission) She presented with encephalopathy, tachypnea, tachycardia, fever (met sepsis criteria). Lactic acid is normal. Procalcitonin level 6.57. CRP 25.7. Source of infection right foot cellulitis. Initially started on IV vancomycin, ceftriaxone and Flagyl. Blood culture came back+ for strep pyogenes. MRI negative for osteomyelitis but does show evidence of microabscesses. ID consulted. Appreciate assistance.  Now on IV penicillin. Podiatry consulted, currently recommending close observation and conservative management.,  No indication for surgery at this time. ID recommended needs 2 weeks of antibiotic can be discharged on amoxicillin. Sepsis physiology resolved.  Uncontrolled type 2 diabetes mellitus with hyperglycemia, with long-term current use of insulin with hyperlipidemia Hemoglobin A1c 7.9. Continue sliding scale insulin. Continue Lantus and hold metformin.  Transaminitis- (present on admission) Resolved.  LFTs normalized. In the setting of sepsis.  Alzheimer's dementia with behavioral disturbance ([place])- (present on admission) Continue Seroquel and Ativan.  Resume  home regimen.  Essential hypertension- (present on admission) Blood pressure mildly elevated. Continue Norvasc and hydralazine.   Acute metabolic encephalopathy- (present on admission) She is back to her baseline mental status. At baseline patient is pleasantly confused but able to converse coherently. Currently incoherent speech.  Not  able to follow commands. CT of the head unremarkable.  MRI brain also negative for any acute abnormality. Metabolic work-up otherwise unremarkable including ammonia level, B12, TSH.  AKI (acute kidney injury) ([place])- (present on admission) Baseline serum creatinine 0.67.   She presented with 1.21 elevated BUN as well.  Improved with IV hydration.  Obesity (BMI 30-39.9)- (present on admission) Body mass index is 28.5 kg/m.  She is at high risk of poor outcome.  Hyperlipidemia- (present on admission) Continue Crestor.   Subjective:  Patient was seen and examined at bedside.  Overnight events noted.  Patient continues to have confusion.  She responds with yes and no answers.   Physical Exam: Vitals:   01/10/22 2043 01/11/22 0400 01/11/22 0510 01/11/22 1214  BP: (!) 151/60  (!) 153/82 (!) 154/59  Pulse: 66  60 61  Resp: $Remo'18  18 18  'Llbjs$ Temp: 99.3 F (37.4 C)  98.6 F (37 C) 97.7 F (36.5 C)  TempSrc: Oral  Oral   SpO2: 98%  99% 95%  Weight:  80.8 kg    Height:       General: Appears comfortable, not in any acute distress.  Deconditioned Cardiovascular: S1-S2 heard, regular rate and rhythm, no murmur. Respiratory: Clear to auscultation bilaterally, good air entry, no crackles, no wheezing Abdomen: Abdominal soft, nontender, nondistended, BS+ Extremities: Edema+, no cyanosis, no clubbing. Neurology: Patient is alert but not oriented. Gait not checked due to patient safety concerns  Data Reviewed: I have Reviewed nursing notes, Vitals, and Lab results since pt's last encounter. Pertinent lab results CBC CMP CRP I have ordered imaging  studies CBC CMP. I have reviewed the last note from podiatry infectious diseases,  I have discussed pt's care plan and test results with infectious disease.   Family Communication: Family at bedside .  Disposition: Status is: Inpatient Remains inpatient appropriate because: Requiring IV antibiotic.  If does not require surgery potentially can go back to [facility] on 2/16.  Author: Shawna Clamp, MD 01/11/2022 2:07 PM  For on call review [email or web address].

## 2022-01-11 NOTE — Progress Notes (Signed)
Inpatient Diabetes Program Recommendations  AACE/ADA: New Consensus Statement on Inpatient Glycemic Control (2015)  Target Ranges:  Prepandial:   less than 140 mg/dL      Peak postprandial:   less than 180 mg/dL (1-2 hours)      Critically ill patients:  140 - 180 mg/dL   Lab Results  Component Value Date   GLUCAP 308 (H) 01/11/2022   HGBA1C 7.9 (H) 01/08/2022    Review of Glycemic Control  Latest Reference Range & Units 01/10/22 20:38 01/11/22 07:13 01/11/22 12:13  Glucose-Capillary 70 - 99 mg/dL 466 (H) 599 (H) 357 (H)  (H): Data is abnormally high Home DM Meds: Lantus 30 units QHS   Humalog 0-14 units TID per SSI   Metformin 1000 mg daily      Current Orders: Semglee 20 units QHS               Novolog 0-15 units TID ac/hs      Consider increasing Semglee to 30 units QHS and also adding Novolog Meal Coverage: Novolog 3 units TID with meals HOLD if pt eats <50% meals.  Thanks, Lujean Rave, MSN, RNC-OB Diabetes Coordinator [phone] (8a-5p)

## 2022-01-12 LAB — GLUCOSE, CAPILLARY
Glucose-Capillary: 223 mg/dL — ABNORMAL HIGH (ref 70–99)
Glucose-Capillary: 344 mg/dL — ABNORMAL HIGH (ref 70–99)
Glucose-Capillary: 257 mg/dL — ABNORMAL HIGH (ref 70–99)

## 2022-01-12 LAB — CREATININE, SERUM
Creatinine, Ser: 1.02 mg/dL — ABNORMAL HIGH (ref 0.44–1.00)
GFR, Estimated: >60 mL/min (ref >60)

## 2022-01-12 MED ORDER — AMOXICILLIN 500 MG PO CAPS: 500.0000 mg | ORAL_CAPSULE | Freq: Three times a day (TID) | ORAL | 0 refills | Status: DC

## 2022-01-12 MED ORDER — LORAZEPAM 0.5 MG PO TABS: 0.5000 mg | ORAL_TABLET | Freq: Two times a day (BID) | ORAL | 0 refills | Status: DC

## 2022-01-12 NOTE — Progress Notes (Signed)
Pt discharged to [facility] via [place] in stable condition. Appears calm and comfortable, all belongings sent with patient.  Coolidge Breeze, RN 01/12/2022

## 2022-01-12 NOTE — Discharge Instructions (Signed)
Advised to follow-up with primary care physician in 1 week. Advised to take amoxicillin 500 mg 3 times daily until 11/23/2022 for group G strep bacteremia. Follow-up with podiatry and infectious diseases is advised.

## 2022-01-12 NOTE — Progress Notes (Signed)
Report called to [facility] memory care. They are ready to accept patient. Patient's friend Doren Custard notified and aware. Coolidge Breeze, RN 01/12/2022

## 2022-01-12 NOTE — Discharge Summary (Addendum)
Physician Discharge Summary   Patient: Mary Swanson MRN: [id number] DOB: 02-16-1956  Admit date:     01/06/2022  Discharge date: 01/12/22  Discharge Physician: Shawna Clamp   PCP: Gerlene Fee, DO   Recommendations at discharge:   Advised to follow-up with primary care physician in 1 week. Check CBC/ BMP in 1 week. Advised to take amoxicillin 500 mg 3 times daily until 01/24/2022 for group G strep.bacteremia. Advised to follow-up with podiatry as scheduled.  Discharge Diagnoses: Principal Problem:   Sepsis due to Streptococcus pyogenes due to right foot cellulitis Active Problems:   Hyperlipidemia   Obesity (BMI 30-39.9)   AKI (acute kidney injury) ([place])   Dehydration   Acute metabolic encephalopathy   Essential hypertension   Alzheimer's dementia with behavioral disturbance ([place])   Diabetic foot infection ([place])   Transaminitis   Uncontrolled type 2 diabetes mellitus with hyperglycemia, with long-term current use of insulin with hyperlipidemia  Resolved Problems:   * No resolved hospital problems. [facility] Course: This 66 year old female with PMH  significant of Alzheimer's dementia, type II DM, HTN, HLD presented with tachycardia and fever. She is found to have right foot infection.  Patient was admitted for sepsis secondary to right foot cellulitis.  She was initiated on empiric antibiotics.  Podiatry and infectious disease consulted.  Blood cultures positive for streptococcal pyogenes.  MRI completed, no evidence of osteomyelitis.  There was a very small questionable abscess, clinically redness has resolved with IV antibiotics.  Podiatry suggest there is no need for any surgical intervention.  Antibiotics were de-escalated to penicillin G.  Patient felt better redness has resolved back to her baseline mental status.  Infectious disease recommended amoxicillin 500 mg 3 times daily for next 2 weeks.  Patient is being discharged back to memory care unit.                      Assessment and Plan: * Sepsis due to Streptococcus pyogenes due to right foot cellulitis- (present on admission) She presented with encephalopathy, tachypnea, tachycardia, fever (met sepsis criteria). Lactic acid is normal. Procalcitonin level 6.57. CRP 25.7. Source of infection right foot cellulitis. Initially started on IV vancomycin, ceftriaxone and Flagyl. Blood culture came back+ for strep pyogenes. MRI negative for osteomyelitis but does show evidence of microabscesses. ID consulted. Appreciate assistance.  Now on IV penicillin. Podiatry consulted, currently recommending close observation and conservative management.,  No indication for surgery at this time. ID recommended 2 weeks of antibiotics (amoxicillin 500 3 times daily until 01/24/22) Podiatry signed off, infectious disease signed off. Sepsis physiology resolved.  Uncontrolled type 2 diabetes mellitus with hyperglycemia, with long-term current use of insulin with hyperlipidemia Hemoglobin A1c 7.9. Continue sliding scale insulin. Continue Lantus and hold metformin.  Transaminitis- (present on admission) Resolved.  LFTs normalized. In the setting of sepsis.  Alzheimer's dementia with behavioral disturbance ([place])- (present on admission) Continue Seroquel and Ativan.  Resume home regimen.  Essential hypertension- (present on admission) Blood pressure mildly elevated. Continue Norvasc and hydralazine.   Acute metabolic encephalopathy- (present on admission) She is back to her baseline mental status. At baseline patient is pleasantly confused but able to converse coherently. Currently incoherent speech.  Not  able to follow commands. CT of the head unremarkable.  MRI brain also negative for any acute abnormality. Metabolic work-up otherwise unremarkable including ammonia level, B12, TSH.  AKI (acute kidney injury) ([place])- (present on admission) Baseline serum creatinine 0.67.   She presented with  1.21 elevated  BUN as well.  Improved with IV hydration.  Obesity (BMI 30-39.9)- (present on admission) Body mass index is 28.5 kg/m.  Diet recommendation.  Hyperlipidemia- (present on admission) Continue Crestor.    Pain control - [employer] Controlled Substance Reporting System database was reviewed. and patient was instructed, not to drive, operate heavy machinery, perform activities at heights, swimming or participation in water activities or provide baby-sitting services while on Pain, Sleep and Anxiety Medications; until their outpatient Physician has advised to do so again. Also recommended to not to take more than prescribed Pain, Sleep and Anxiety Medications.   Consultants: Podiatry and infectious diseases. Procedures performed: None Disposition: Home Diet recommendation:  Discharge Diet Orders (From admission, onward)     Start     Ordered   01/12/22 0000  Diet - low sodium heart healthy        01/12/22 1024   01/12/22 0000  Diet Carb Modified        01/12/22 1024           Carb modified diet  DISCHARGE MEDICATION: Allergies as of 01/12/2022       Reactions   Erythromycin Diarrhea   Latex Hives   Lisinopril Cough        Medication List     STOP taking these medications    acetaminophen 325 MG tablet Commonly known as: TYLENOL   glucose 4 GM chewable tablet   QUEtiapine 25 MG tablet Commonly known as: SEROQUEL       TAKE these medications    AgaMatrix Presto Test test strip Generic drug: glucose blood Check blood glucose 3 times daily before meals and as needed   AgaMatrix Presto w/Device Kit Check blood glucose three times daily before meals and as needed   AgaMatrix Ultra-Thin Lancets Misc Check blood glucose 3 times daily before meals and as needed   amLODipine 10 MG tablet Commonly known as: NORVASC Take 1 tablet (10 mg total) by mouth daily.   amoxicillin 500 MG capsule Commonly known as: AMOXIL Take 1 capsule (500 mg total) by mouth 3  (three) times daily. Treat through 01/24/22   aspirin 81 MG chewable tablet Chew 81 mg by mouth daily.   feeding supplement (GLUCERNA SHAKE) Liqd Take 237 mLs by mouth 2 (two) times daily between meals.   ferrous sulfate 325 (65 FE) MG tablet Take 325 mg by mouth daily with breakfast.   insulin lispro 100 UNIT/ML KwikPen Commonly known as: HUMALOG Inject 0-14 Units into the skin 3 (three) times daily. 0-250 0 units 251-300 4 units 301-350 6 units 351-400 10 units 401-450 12 units 451+ 14 units & notify MD   Insulin Pen Needle 32G X 4 MM Misc Commonly known as: BD Pen Needle Nano U/F 1 Stick by Does not apply route every morning.   Lantus SoloStar 100 UNIT/ML Solostar Pen Generic drug: insulin glargine Inject 18 Units into the skin daily. What changed:  how much to take when to take this   LORazepam 0.5 MG tablet Commonly known as: ATIVAN Take 1 tablet (0.5 mg total) by mouth in the morning and at bedtime. What changed:  when to take this additional instructions   metFORMIN 500 MG 24 hr tablet Commonly known as: GLUCOPHAGE-XR Take 1,000 mg by mouth daily with breakfast.   multivitamin with minerals Tabs tablet Take 1 tablet by mouth daily.   rosuvastatin 20 MG tablet Commonly known as: CRESTOR Take 20 mg by mouth at bedtime.   thiamine  100 MG tablet Take 1 tablet (100 mg total) by mouth daily.        Follow-up Information     Autry-Lott, Simone, DO Follow up in 1 week(s).   Specialty: Family Medicine Contact information: [address] [address] [phone]         Felipa Furnace, DPM Follow up in 1 week(s).   Specialty: Podiatry Contact information: [address] [place] [address] [phone]                 Discharge Exam: Danley Danker Weights   01/10/22 0500 01/11/22 0400 01/12/22 0547  Weight: 80.1 kg 80.8 kg 79.7 kg  Physical Exam Vitals and nursing note reviewed.  Constitutional:      Appearance: Normal  appearance. She is normal weight.  HENT:     Head: Atraumatic.  Eyes:     Conjunctiva/sclera: Conjunctivae normal.  Cardiovascular:     Rate and Rhythm: Normal rate and regular rhythm.     Heart sounds: Normal heart sounds.  Pulmonary:     Effort: Pulmonary effort is normal.     Breath sounds: Normal breath sounds.  Abdominal:     General: Abdomen is flat. Bowel sounds are normal.     Palpations: Abdomen is soft.  Musculoskeletal:     Comments: Right foot redness has resolved.  No open wound.  Neurological:     General: No focal deficit present.     Mental Status: She is alert. Mental status is at baseline.     Condition at discharge: stable  The results of significant diagnostics from this hospitalization (including imaging, microbiology, ancillary and laboratory) are listed below for reference.   Imaging Studies: CT Head Wo Contrast  Result Date: 01/06/2022 CLINICAL DATA:  Delirium EXAM: CT HEAD WITHOUT CONTRAST TECHNIQUE: Contiguous axial images were obtained from the base of the skull through the vertex without intravenous contrast. RADIATION DOSE REDUCTION: This exam was performed according to the departmental dose-optimization program which includes automated exposure control, adjustment of the mA and/or kV according to patient size and/or use of iterative reconstruction technique. COMPARISON:  CT head 06/15/2021 BRAIN: BRAIN Cerebral ventricle sizes are concordant with the degree of cerebral volume loss. Mild patchy and confluent areas of decreased attenuation are noted throughout the deep and periventricular white matter of the cerebral hemispheres bilaterally, compatible with chronic microvascular ischemic disease. No evidence of large-territorial acute infarction. No parenchymal hemorrhage. No mass lesion. No extra-axial collection. No mass effect or midline shift. No hydrocephalus. Basilar cisterns are patent. Vascular: No hyperdense vessel. Atherosclerotic calcifications are  present within the cavernous internal carotid arteries. Skull: No acute fracture or focal lesion. Sinuses/Orbits: Frothy mucosal thickening within the bilateral sphenoid sinuses and right ethmoid sinus paranasal sinuses and mastoid air cells are clear. Bilateral lens replacement. Otherwise the orbits are unremarkable. Other: None. IMPRESSION: No acute intracranial abnormality. Electronically Signed   By: Iven Finn M.D.   On: 01/06/2022 17:20   MR BRAIN WO CONTRAST  Result Date: 01/07/2022 CLINICAL DATA:  Mental status change, unknown cause. EXAM: MRI HEAD WITHOUT CONTRAST TECHNIQUE: Multiplanar, multiecho pulse sequences of the brain and surrounding structures were obtained without intravenous contrast. COMPARISON:  Head CT 01/06/2022 and MRI 12/28/2019 FINDINGS: Brain: There is no evidence of an acute infarct, intracranial hemorrhage, mass, midline shift, or extra-axial fluid collection. There is mild cerebral atrophy with note made of asymmetric volume loss in the right temporal lobe. T2 hyperintensities in the periventricular white matter are nonspecific  but compatible with minimal chronic small vessel ischemic disease, not advanced for age. Vascular: Major intracranial vascular flow voids are preserved. Skull and upper cervical spine: Unremarkable bone marrow signal. Sinuses/Orbits: Bilateral cataract extraction. Trace mucosal thickening in the paranasal sinuses. Small left mastoid effusion. Other: None. IMPRESSION: 1. No acute intracranial abnormality. 2. Mild cerebral atrophy. Electronically Signed   By: Logan Bores M.D.   On: 01/07/2022 14:13   MR FOOT RIGHT WO CONTRAST  Result Date: 01/07/2022 CLINICAL DATA:  Foot swelling, nondiabetic, osteomyelitis suspected EXAM: MRI OF THE RIGHT FOREFOOT WITHOUT CONTRAST TECHNIQUE: Multiplanar, multisequence MR imaging of the right forefoot was performed. No intravenous contrast was administered. COMPARISON:  X-ray 01/06/2022 FINDINGS:  Bones/Joint/Cartilage No acute fracture. No dislocation. No focal bone erosion or marrow replacement. No bone marrow edema. Mild arthropathy is most pronounced at the first MTP joint. No effusion. No bone lesion. Ligaments Intact Lisfranc ligament. Collateral ligaments of the forefoot are intact. Muscles and Tendons Diffuse edema-like signal throughout the foot musculature may represent changes related to denervation or myositis. Intact flexor and extensor tendons. Trace tenosynovitis of the flexor hallucis longus tendon at the level of the great toe proximal phalanx. No tendinous disruption. Soft tissues Circumferential soft tissue edema. Small areas of slightly more confluent fluid signal including at the plantar-medial aspect of the foot at the level of the first metatarsal neck measuring 14 x 7 x 13 mm (series 5, image 21). Additional superficial collection at the plantar aspect of the forefoot at the level of the first metatarsal diaphysis measuring 15 x 4 x 7 mm (series 5, image 23). No discrete ulceration IMPRESSION: 1. No evidence of acute osteomyelitis of the right forefoot. 2. Circumferential soft tissue edema. Small areas of slightly more confluent fluid within the plantar-medial soft tissues underlying the first metatarsal concerning for small developing abscesses. 3. Trace tenosynovitis of the flexor hallucis longus tendon at the level of the great toe proximal phalanx. 4. Diffuse edema-like signal throughout the foot musculature may represent changes related to denervation or myositis. Electronically Signed   By: Davina Poke D.O.   On: 01/07/2022 16:19   CT CHEST ABDOMEN PELVIS W CONTRAST  Result Date: 01/06/2022 CLINICAL DATA:  Altered mental status.  Sepsis. EXAM: CT CHEST, ABDOMEN, AND PELVIS WITH CONTRAST TECHNIQUE: Multidetector CT imaging of the chest, abdomen and pelvis was performed following the standard protocol during bolus administration of intravenous contrast. RADIATION DOSE  REDUCTION: This exam was performed according to the departmental dose-optimization program which includes automated exposure control, adjustment of the mA and/or kV according to patient size and/or use of iterative reconstruction technique. CONTRAST:  68m OMNIPAQUE IOHEXOL 300 MG/ML  SOLN COMPARISON:  None. FINDINGS: CT CHEST FINDINGS Cardiovascular: Normal heart size. No significant pericardial effusion. The thoracic aorta is normal in caliber. Mild atherosclerotic plaque of the thoracic aorta. No coronary artery calcifications. Mediastinum/Nodes: No enlarged mediastinal, hilar, or axillary lymph nodes. Thyroid gland, trachea, and esophagus demonstrate no significant findings. Small to moderate volume hiatal hernia. Right paratracheal/precarinal conglomerative lymph node measuring up to 1.3 cm (2:22). Lungs/Pleura: Bilateral lower lobe subsegmental atelectasis. Mild biapical interlobular septal wall thickening. Query scattered tree-in-bud nodularity along the bilateral apices. No focal consolidation. No pulmonary nodule. No pulmonary mass. No pleural effusion. No pneumothorax. Musculoskeletal: No chest wall abnormality. Bilateral peripherally calcified breast implants. No suspicious lytic or blastic osseous lesions. No acute displaced fracture. CT ABDOMEN PELVIS FINDINGS Hepatobiliary: No focal liver abnormality. No gallstones, gallbladder wall thickening, or pericholecystic fluid. No biliary  dilatation. Pancreas: No focal lesion. Normal pancreatic contour. No surrounding inflammatory changes. No main pancreatic ductal dilatation. Spleen: Normal in size without focal abnormality. Adrenals/Urinary Tract: No adrenal nodule bilaterally. Bilateral kidneys enhance symmetrically. Subcentimeter hypodensities too small to characterize. No hydronephrosis. No hydroureter. The urinary bladder is unremarkable. On delayed imaging, there is no urothelial wall thickening and there are no filling defects in the opacified portions  of the bilateral collecting systems or ureters. Stomach/Bowel: Stomach is within normal limits. No evidence of bowel wall thickening or dilatation. Stool throughout the colon. Appendix appears normal. Vascular/Lymphatic: No abdominal aorta or iliac aneurysm. Mild atherosclerotic plaque of the aorta and its branches. Prominent but nonenlarged right inguinal lymph node measuring up to 1.4 cm. No abdominal, pelvic, or inguinal lymphadenopathy. Reproductive: Uterus and bilateral adnexa are unremarkable. Other: No intraperitoneal free fluid. No intraperitoneal free gas. No organized fluid collection. Musculoskeletal: Small fat containing umbilical hernia. No suspicious lytic or blastic osseous lesions. No acute displaced fracture. IMPRESSION: 1. Vague bilateral lung apices changes may represent developing atypical infection versus mild pulmonary edema. 2. Nonspecific right paratracheal/precarinal enlarged lymph node. 3. Small to moderate volume hiatal hernia. 4. Stool throughout the colon.  Correlate for constipation. 5. No acute intra-abdominal or intrapelvic abnormality. 6.  Aortic Atherosclerosis (ICD10-I70.0). Electronically Signed   By: Iven Finn M.D.   On: 01/06/2022 17:16   DG Chest Port 1 View  Result Date: 01/06/2022 CLINICAL DATA:  A 66 year old fee female presents for evaluation of potential sepsis. EXAM: PORTABLE CHEST 1 VIEW COMPARISON:  June 15, 2021. FINDINGS: EKG leads project over the chest. Cardiomediastinal contours and hilar structures are stable. No lobar consolidation.  No effusion or visible pneumothorax. Partially calcified breast implants project over the chest as before. On limited assessment there is no acute skeletal process. IMPRESSION: No acute cardiopulmonary process. Electronically Signed   By: Zetta Bills M.D.   On: 01/06/2022 14:29   DG Foot Complete Right  Result Date: 01/06/2022 CLINICAL DATA:  Sepsis, foot wound EXAM: RIGHT FOOT COMPLETE - 3+ VIEW COMPARISON:  None.  FINDINGS: There is no evidence of fracture or dislocation. There is no evidence of arthropathy or other focal bone abnormality. Subcutaneus soft tissue edema of the forefoot/midfoot dorsum. Vascular calcifications. IMPRESSION: Negative. Electronically Signed   By: Iven Finn M.D.   On: 01/06/2022 18:19   ECHOCARDIOGRAM COMPLETE  Result Date: 01/08/2022    ECHOCARDIOGRAM REPORT   Patient Name:   Mary Swanson Date of Exam: 01/08/2022 Medical Rec #:  [id number]    Height:       66.0 in Accession #:    [id number]   Weight:       175.3 lb Date of Birth:  [birth year]   BSA:          1.891 m Patient Age:    31 years     BP:           137/62 mmHg Patient Gender: F            HR:           59 bpm. Exam Location:  Inpatient Procedure: 2D Echo, Cardiac Doppler and Color Doppler Indications:     Bacteremia  History:         Patient has no prior history of Echocardiogram examinations.                  Signs/Symptoms:Alzheimer's, Altered Mental Status, Bacteremia,  Murmur and Fever; Risk Factors:Hypertension, Diabetes and                  Dyslipidemia.  Sonographer:     Roseanna Rainbow RDCS Referring Phys:  [id number] [facility] Diagnosing Phys: Fransico Him MD  Sonographer Comments: Technically difficult study due to poor echo windows. Patient could not follow directions. IMPRESSIONS  1. Left ventricular ejection fraction, by estimation, is 60 to 65%. The left ventricle has normal function. The left ventricle has no regional wall motion abnormalities. Left ventricular diastolic parameters are consistent with Grade I diastolic dysfunction (impaired relaxation).  2. Right ventricular systolic function is normal. The right ventricular size is normal. There is normal pulmonary artery systolic pressure. The estimated right ventricular systolic pressure is 83.3 mmHg.  3. The mitral valve is normal in structure. Mild mitral valve regurgitation. No evidence of mitral stenosis.  4. The aortic valve is normal in  structure. Aortic valve regurgitation is not visualized. No aortic stenosis is present.  5. The inferior vena cava is normal in size with greater than 50% respiratory variability, suggesting right atrial pressure of 3 mmHg. Conclusion(s)/Recommendation(s): No evidence of valvular vegetations on this transthoracic echocardiogram. Consider a transesophageal echocardiogram to exclude infective endocarditis if clinically indicated. FINDINGS  Left Ventricle: Left ventricular ejection fraction, by estimation, is 60 to 65%. The left ventricle has normal function. The left ventricle has no regional wall motion abnormalities. The left ventricular internal cavity size was normal in size. There is  no left ventricular hypertrophy. Left ventricular diastolic parameters are consistent with Grade I diastolic dysfunction (impaired relaxation). Normal left ventricular filling pressure. Right Ventricle: The right ventricular size is normal. No increase in right ventricular wall thickness. Right ventricular systolic function is normal. There is normal pulmonary artery systolic pressure. The tricuspid regurgitant velocity is 2.79 m/s, and  with an assumed right atrial pressure of 3 mmHg, the estimated right ventricular systolic pressure is 38.3 mmHg. Left Atrium: Left atrial size was normal in size. Right Atrium: Right atrial size was normal in size. Pericardium: There is no evidence of pericardial effusion. Mitral Valve: The mitral valve is normal in structure. Mild mitral valve regurgitation. No evidence of mitral valve stenosis. Tricuspid Valve: The tricuspid valve is normal in structure. Tricuspid valve regurgitation is mild . No evidence of tricuspid stenosis. Aortic Valve: The aortic valve is normal in structure. Aortic valve regurgitation is not visualized. No aortic stenosis is present. Pulmonic Valve: The pulmonic valve was normal in structure. Pulmonic valve regurgitation is not visualized. No evidence of pulmonic stenosis.  Aorta: The aortic root is normal in size and structure. Venous: The inferior vena cava is normal in size with greater than 50% respiratory variability, suggesting right atrial pressure of 3 mmHg. IAS/Shunts: No atrial level shunt detected by color flow Doppler.  LEFT VENTRICLE PLAX 2D LVIDd:         3.70 cm     Diastology LVIDs:         2.50 cm     LV e' medial:    9.46 cm/s LV PW:         0.90 cm     LV E/e' medial:  10.0 LV IVS:        1.10 cm     LV e' lateral:   7.83 cm/s LVOT diam:     1.90 cm     LV E/e' lateral: 12.1 LV SV:         71 LV SV Index:  38 LVOT Area:     2.84 cm  LV Volumes (MOD) LV vol d, MOD A2C: 71.1 ml LV vol d, MOD A4C: 75.6 ml LV vol s, MOD A2C: 26.0 ml LV vol s, MOD A4C: 27.2 ml LV SV MOD A2C:     45.1 ml LV SV MOD A4C:     75.6 ml LV SV MOD BP:      45.3 ml RIGHT VENTRICLE             IVC RV S prime:     15.10 cm/s  IVC diam: 1.90 cm TAPSE (M-mode): 2.0 cm LEFT ATRIUM             Index        RIGHT ATRIUM           Index LA diam:        2.90 cm 1.53 cm/m   RA Area:     13.00 cm LA Vol (A2C):   36.2 ml 19.15 ml/m  RA Volume:   32.70 ml  17.30 ml/m LA Vol (A4C):   61.4 ml 32.47 ml/m LA Biplane Vol: 49.7 ml 26.29 ml/m  AORTIC VALVE LVOT Vmax:   141.00 cm/s LVOT Vmean:  92.400 cm/s LVOT VTI:    0.252 m  AORTA Ao Root diam: 2.40 cm Ao Asc diam:  2.60 cm MITRAL VALVE                TRICUSPID VALVE MV Area (PHT): 4.15 cm     TR Peak grad:   31.1 mmHg MV Decel Time: 183 msec     TR Vmax:        279.00 cm/s MV E velocity: 94.90 cm/s MV A velocity: 125.00 cm/s  SHUNTS MV E/A ratio:  0.76         Systemic VTI:  0.25 m                             Systemic Diam: 1.90 cm Fransico Him MD Electronically signed by Fransico Him MD Signature Date/Time: 01/08/2022/3:29:07 PM    Final (Updated)     Microbiology: Results for orders placed or performed during the hospital encounter of 01/06/22  Blood Culture (routine x 2)     Status: Abnormal   Collection Time: 01/06/22  1:41 PM   Specimen: BLOOD   Result Value Ref Range Status   Specimen Description   Final    BLOOD RIGHT ANTECUBITAL Performed at [facility], [place] [address] [place], [place] [address]    Special Requests   Final    BOTTLES DRAWN AEROBIC AND ANAEROBIC Blood Culture results may not be optimal due to an inadequate volume of blood received in culture bottles Performed at [facility] [address], [place], [place] [address]    Culture  Setup Time   Final    GRAM POSITIVE COCCI AEROBIC BOTTLE ONLY CRITICAL VALUE NOTED.  VALUE IS CONSISTENT WITH PREVIOUSLY REPORTED AND CALLED VALUE.    Culture (A)  Final    STREPTOCOCCUS PYOGENES SUSCEPTIBILITIES PERFORMED ON PREVIOUS CULTURE WITHIN THE LAST 5 DAYS. HEALTH DEPARTMENT NOTIFIED Performed at [facility], [place] [address], [place], [place] [address]    Report Status 01/09/2022 FINAL  Final  Urine Culture     Status: None   Collection Time: 01/06/22  1:41 PM   Specimen: In/Out Cath Urine  Result Value Ref Range Status   Specimen Description  Final    IN/OUT CATH URINE Performed at [facility], [place] [address], [place], [place] [address]    Special Requests   Final    NONE Performed at [facility], [place] [address] [place], [place] [address]    Culture   Final    NO GROWTH Performed at [facility], [place] [address] [place], [place] [address]    Report Status 01/07/2022 FINAL  Final  Blood Culture (routine x 2)     Status: Abnormal   Collection Time: 01/06/22  1:46 PM   Specimen: BLOOD  Result Value Ref Range Status   Specimen Description   Final    BLOOD RIGHT ANTECUBITAL Performed at [place] [address] [place], [place] [address]    Special Requests   Final    BOTTLES DRAWN AEROBIC AND ANAEROBIC Blood Culture results may not be optimal due to an inadequate volume of blood received in culture bottles Performed at [place] [address], [place], [place] [address]    Culture  Setup Time   Final    GRAM POSITIVE COCCI IN BOTH AEROBIC AND ANAEROBIC BOTTLES CRITICAL RESULT CALLED TO, READ BACK BY AND VERIFIED WITH: Vena Austria S.CHRISTY ON [id number] AT 1100 BY E.PARRISH    Culture (A)  Final    STREPTOCOCCUS PYOGENES HEALTH DEPARTMENT NOTIFIED Performed at [facility], [place] [address], [place],  [address]    Report Status 01/09/2022 FINAL  Final   Organism ID, Bacteria STREPTOCOCCUS PYOGENES  Final      Susceptibility   Streptococcus pyogenes - MIC*    PENICILLIN <=0.06 SENSITIVE Sensitive     CEFTRIAXONE <=0.12 SENSITIVE Sensitive     ERYTHROMYCIN <=0.12 SENSITIVE Sensitive     LEVOFLOXACIN 0.5 SENSITIVE Sensitive     VANCOMYCIN 0.5 SENSITIVE Sensitive     * STREPTOCOCCUS PYOGENES  Blood Culture ID Panel (Reflexed)     Status: Abnormal   Collection Time: 01/06/22  1:46 PM  Result Value Ref Range Status   Enterococcus faecalis NOT DETECTED NOT DETECTED Final   Enterococcus Faecium NOT DETECTED NOT DETECTED Final   Listeria monocytogenes NOT DETECTED NOT DETECTED Final   Staphylococcus species NOT DETECTED NOT DETECTED Final   Staphylococcus aureus (BCID) NOT DETECTED NOT DETECTED Final   Staphylococcus epidermidis NOT DETECTED NOT DETECTED Final   Staphylococcus lugdunensis NOT DETECTED NOT DETECTED Final   Streptococcus species DETECTED (A) NOT DETECTED Final    Comment: CRITICAL RESULT CALLED TO, READ BACK BY AND VERIFIED WITH: PHARM D S.CHRISTY ON [id number] AT 1100 BY E.PARRISH    Streptococcus agalactiae NOT DETECTED NOT DETECTED Final   Streptococcus pneumoniae NOT DETECTED NOT DETECTED Final   Streptococcus pyogenes DETECTED (A) NOT DETECTED Final    Comment: CRITICAL RESULT CALLED TO, READ BACK BY AND VERIFIED WITH: PHARM D S.CHRISTY ON [id number] AT 1100 BY E.PARRISH    A.calcoaceticus-baumannii NOT DETECTED NOT DETECTED Final   Bacteroides fragilis NOT DETECTED NOT  DETECTED Final   Enterobacterales NOT DETECTED NOT DETECTED Final   Enterobacter cloacae complex NOT DETECTED NOT DETECTED Final   Escherichia coli NOT DETECTED NOT DETECTED Final   Klebsiella aerogenes NOT DETECTED NOT DETECTED Final   Klebsiella oxytoca NOT DETECTED NOT DETECTED Final   Klebsiella pneumoniae NOT DETECTED NOT DETECTED Final   Proteus species NOT DETECTED NOT DETECTED Final   Salmonella species NOT DETECTED NOT DETECTED Final   Serratia marcescens NOT DETECTED NOT DETECTED  Final   Haemophilus influenzae NOT DETECTED NOT DETECTED Final   Neisseria meningitidis NOT DETECTED NOT DETECTED Final   Pseudomonas aeruginosa NOT DETECTED NOT DETECTED Final   Stenotrophomonas maltophilia NOT DETECTED NOT DETECTED Final   Candida albicans NOT DETECTED NOT DETECTED Final   Candida auris NOT DETECTED NOT DETECTED Final   Candida glabrata NOT DETECTED NOT DETECTED Final   Candida krusei NOT DETECTED NOT DETECTED Final   Candida parapsilosis NOT DETECTED NOT DETECTED Final   Candida tropicalis NOT DETECTED NOT DETECTED Final   Cryptococcus neoformans/gattii NOT DETECTED NOT DETECTED Final    Comment: Performed at [facility], [place] [address] [place], [place] [address]  Resp Panel by RT-PCR (Flu A&B, Covid) Nasopharyngeal Swab     Status: None   Collection Time: 01/06/22  2:09 PM   Specimen: Nasopharyngeal Swab; Nasopharyngeal(NP) swabs in vial transport medium  Result Value Ref Range Status   SARS Coronavirus 2 by RT PCR NEGATIVE NEGATIVE Final    Comment: (NOTE) SARS-CoV-2 target nucleic acids are NOT DETECTED.  The SARS-CoV-2 RNA is generally detectable in upper respiratory specimens during the acute phase of infection. The lowest concentration of SARS-CoV-2 viral copies this assay can detect is 138 copies/mL. A negative result does not preclude SARS-Cov-2 infection and should not be used as the sole basis for treatment or other patient management decisions. A negative  result may occur with  improper specimen collection/handling, submission of specimen other than nasopharyngeal swab, presence of viral mutation(s) within the areas targeted by this assay, and inadequate number of viral copies(<138 copies/mL). A negative result must be combined with clinical observations, patient history, and epidemiological information. The expected result is Negative.  Fact Sheet for Patients:  EntrepreneurPulse.com.au  Fact Sheet for Healthcare Providers:  IncredibleEmployment.be  This test is no t yet approved or cleared by the [place] FDA and  has been authorized for detection and/or diagnosis of SARS-CoV-2 by FDA under an Emergency Use Authorization (EUA). This EUA will remain  in effect (meaning this test can be used) for the duration of the COVID-19 declaration under Section 564(b)(1) of the Act, 21 U.S.C.section 360bbb-3(b)(1), unless the authorization is terminated  or revoked sooner.       Influenza A by PCR NEGATIVE NEGATIVE Final   Influenza B by PCR NEGATIVE NEGATIVE Final    Comment: (NOTE) The Xpert Xpress SARS-CoV-2/FLU/RSV plus assay is intended as an aid in the diagnosis of influenza from Nasopharyngeal swab specimens and should not be used as a sole basis for treatment. Nasal washings and aspirates are unacceptable for Xpert Xpress SARS-CoV-2/FLU/RSV testing.  Fact Sheet for Patients: EntrepreneurPulse.com.au  Fact Sheet for Healthcare Providers: IncredibleEmployment.be  This test is not yet approved or cleared by the [place] FDA and has been authorized for detection and/or diagnosis of SARS-CoV-2 by FDA under an Emergency Use Authorization (EUA). This EUA will remain in effect (meaning this test can be used) for the duration of the COVID-19 declaration under Section 564(b)(1) of the Act, 21 U.S.C. section 360bbb-3(b)(1), unless the authorization is  terminated or revoked.  Performed at [facility], [place] [address], [place], [place] [address]   Culture, blood (routine x 2)     Status: None (Preliminary result)   Collection Time: 01/08/22  7:29 AM   Specimen: BLOOD  Result Value Ref Range Status   Specimen Description   Final    BLOOD BLOOD RIGHT FOREARM Performed at [place] [address]  [place], [place] [address]    Special Requests   Final    BOTTLES DRAWN AEROBIC ONLY Blood Culture adequate volume Performed at [place] [address] [place], [place] [address]    Culture   Final    NO GROWTH 4 DAYS Performed at [facility], [place] [address], [place], [place] [address]    Report Status PENDING  Incomplete  Culture, blood (routine x 2)     Status: None (Preliminary result)   Collection Time: 01/08/22  7:29 AM   Specimen: BLOOD  Result Value Ref Range Status   Specimen Description   Final    BLOOD RIGHT ANTECUBITAL Performed at [place] [address], [place], [place] [address]    Special Requests   Final    BOTTLES DRAWN AEROBIC ONLY Blood Culture adequate volume Performed at [facility] [address] [place], [place] [address]    Culture   Final    NO GROWTH 4 DAYS Performed at [facility], [place] [address], [place], [place] [address]    Report Status PENDING  Incomplete    Labs: CBC: Recent Labs  Lab 01/07/22 0605 01/07/22 0822 01/08/22 0509 01/09/22 0510 01/10/22 0450 01/11/22 0501  WBC QUESTIONABLE RESULT/NEW SPEC REQ 19.3* 19.8* 15.9* 11.0* 10.1  NEUTROABS QUESTIONABLE RESULT/NEW SPEC REQ 15.5* 16.4* 12.4* 7.4  --   HGB QUESTIONABLE RESULT/NEW SPEC REQ 10.3* 9.7* 10.6* 9.4* 9.2*  HCT QUESTIONABLE RESULT/NEW SPEC REQ 33.2* 31.8* 33.4* 29.9* 29.5*  MCV QUESTIONABLE RESULT/NEW SPEC REQ 99.4 99.7 97.4 96.8 99.7  PLT QUESTIONABLE RESULT/NEW SPEC REQ 259 PLATELET CLUMPS NOTED ON  SMEAR, UNABLE TO ESTIMATE [phone]   Basic Metabolic Panel: Recent Labs  Lab 01/07/22 0605 01/08/22 0509 01/09/22 0510 01/10/22 0450 01/11/22 0501 01/12/22 0427  NA 147* 139 138 135 136  --   K 3.7 3.3* 3.6 3.8 3.9  --   CL 112* 106 105 105 104  --   CO2 _0 --   GLUCOSE 113* 197* 219* 233* 299*  --   BUN _1 --   CREATININE 1.22* 1.04* 1.02* 1.01* 0.94 1.02*  CALCIUM 9.6 8.8* 8.9 8.7* 8.6*  --   MG 2.0   1.9 1.9 2.6* 2.1  --   --   PHOS 3.1  --   --   --   --   --    Liver Function Tests: Recent Labs  Lab 01/06/22 1516 01/07/22 0605 01/08/22 0509 01/09/22 0510 01/10/22 0450  AST 134* 57* 36 16 30  ALT 122* 85* 61* 19 37  ALKPHOS 169* 139* 132* 64 99  BILITOT 0.8 0.7 0.4 0.1* 0.5  PROT 7.9 7.9 7.5 5.7* 7.8  ALBUMIN 2.5* 2.3* 2.1* 3.1* 2.1*   CBG: Recent Labs  Lab 01/11/22 0713 01/11/22 1213 01/11/22 1614 01/11/22 2148 01/12/22 0720  GLUCAP 264* 308* 321* 249* 223*    Discharge time spent: greater than 30 minutes.  Signed: Shawna Clamp, MD [facility] 01/12/2022

## 2022-01-12 NOTE — TOC Transition Note (Addendum)
Transition of Care [facility]) - CM/SW Discharge Note   Patient Details  Name: Mary Swanson MRN: [id number] Date of Birth: May 29, 1956  Transition of Care [facility]) CM/SW Contact:  Lanier Clam, RN Phone Number: 01/12/2022, 9:19 AM   Clinical Narrative:d/c back to [facility] care-rep [facility] aware. Going to rm#2,Nsg call report tel#[phone] once d/c summary available. PTAR for transport. No further CM needs.  -11:40a-confirmed w/facility-scripts electronically sent to [facility] address on AVS is correct. PTAR called.      Final next level of care: Memory Care Barriers to Discharge: No Barriers Identified   Patient Goals and CMS Choice Patient states their goals for this hospitalization and ongoing recovery are:: Memory Care-[facility] [address] CMS Medicare.gov Compare Post Acute Care list provided to:: Patient Represenative (must comment) [name] friend [phone]) Choice offered to / list presented to :  Jackelyn Hoehn)  Discharge Placement              Patient chooses bed at: Other - please specify in the comment section below: [facility]memory care) Patient to be transferred to facility by:  Sharin [place]) Name of family member notified:  [name] [phone])    Discharge Plan and Services   Discharge Planning Services: CM Consult Post Acute Care Choice:  (Memory Care)                               Social Determinants of Health (SDOH) Interventions     Readmission Risk Interventions Readmission Risk Prevention Plan 01/10/2022  Transportation Screening Complete  PCP or Specialist Appt within 3-5 Days Complete  HRI or Home Care Consult Complete  Social Work Consult for Recovery Care Planning/Counseling Complete  Palliative Care Screening Complete  Medication Review [employer]) Complete  Some recent data might be hidden

## 2022-01-13 LAB — CULTURE, BLOOD (ROUTINE X 2)
Culture: NO GROWTH
Culture: NO GROWTH
Special Requests: ADEQUATE
Special Requests: ADEQUATE

## 2022-01-18 DIAGNOSIS — I10 Essential (primary) hypertension: Secondary | ICD-10-CM | POA: Diagnosis not present

## 2022-01-18 DIAGNOSIS — R2681 Unsteadiness on feet: Secondary | ICD-10-CM | POA: Diagnosis not present

## 2022-01-18 DIAGNOSIS — E1165 Type 2 diabetes mellitus with hyperglycemia: Secondary | ICD-10-CM | POA: Diagnosis not present

## 2022-01-18 DIAGNOSIS — Z794 Long term (current) use of insulin: Secondary | ICD-10-CM | POA: Diagnosis not present

## 2022-01-18 DIAGNOSIS — G9341 Metabolic encephalopathy: Secondary | ICD-10-CM | POA: Diagnosis not present

## 2022-01-18 DIAGNOSIS — R451 Restlessness and agitation: Secondary | ICD-10-CM | POA: Diagnosis not present

## 2022-01-18 DIAGNOSIS — E785 Hyperlipidemia, unspecified: Secondary | ICD-10-CM | POA: Diagnosis not present

## 2022-01-18 DIAGNOSIS — M6281 Muscle weakness (generalized): Secondary | ICD-10-CM | POA: Diagnosis not present

## 2022-01-23 DIAGNOSIS — I10 Essential (primary) hypertension: Secondary | ICD-10-CM | POA: Diagnosis not present

## 2022-01-26 DIAGNOSIS — I10 Essential (primary) hypertension: Secondary | ICD-10-CM | POA: Diagnosis not present

## 2022-01-31 DIAGNOSIS — M6281 Muscle weakness (generalized): Secondary | ICD-10-CM | POA: Diagnosis not present

## 2022-01-31 DIAGNOSIS — I10 Essential (primary) hypertension: Secondary | ICD-10-CM | POA: Diagnosis not present

## 2022-02-01 DIAGNOSIS — E1165 Type 2 diabetes mellitus with hyperglycemia: Secondary | ICD-10-CM | POA: Diagnosis not present

## 2022-02-01 DIAGNOSIS — G9341 Metabolic encephalopathy: Secondary | ICD-10-CM | POA: Diagnosis not present

## 2022-02-01 DIAGNOSIS — M6281 Muscle weakness (generalized): Secondary | ICD-10-CM | POA: Diagnosis not present

## 2022-02-01 DIAGNOSIS — I10 Essential (primary) hypertension: Secondary | ICD-10-CM | POA: Diagnosis not present

## 2022-02-01 DIAGNOSIS — E785 Hyperlipidemia, unspecified: Secondary | ICD-10-CM | POA: Diagnosis not present

## 2022-02-08 DIAGNOSIS — E785 Hyperlipidemia, unspecified: Secondary | ICD-10-CM | POA: Diagnosis not present

## 2022-02-08 DIAGNOSIS — F0394 Unspecified dementia, unspecified severity, with anxiety: Secondary | ICD-10-CM | POA: Diagnosis not present

## 2022-02-08 DIAGNOSIS — Z7984 Long term (current) use of oral hypoglycemic drugs: Secondary | ICD-10-CM | POA: Diagnosis not present

## 2022-02-08 DIAGNOSIS — G9341 Metabolic encephalopathy: Secondary | ICD-10-CM | POA: Diagnosis not present

## 2022-02-08 DIAGNOSIS — Z794 Long term (current) use of insulin: Secondary | ICD-10-CM | POA: Diagnosis not present

## 2022-02-08 DIAGNOSIS — E119 Type 2 diabetes mellitus without complications: Secondary | ICD-10-CM | POA: Diagnosis not present

## 2022-02-08 DIAGNOSIS — F03911 Unspecified dementia, unspecified severity, with agitation: Secondary | ICD-10-CM | POA: Diagnosis not present

## 2022-02-08 DIAGNOSIS — I10 Essential (primary) hypertension: Secondary | ICD-10-CM | POA: Diagnosis not present

## 2022-02-13 DIAGNOSIS — L603 Nail dystrophy: Secondary | ICD-10-CM | POA: Diagnosis not present

## 2022-02-13 DIAGNOSIS — E1159 Type 2 diabetes mellitus with other circulatory complications: Secondary | ICD-10-CM | POA: Diagnosis not present

## 2022-02-13 DIAGNOSIS — L84 Corns and callosities: Secondary | ICD-10-CM | POA: Diagnosis not present

## 2022-02-13 DIAGNOSIS — B351 Tinea unguium: Secondary | ICD-10-CM | POA: Diagnosis not present

## 2022-02-15 DIAGNOSIS — E1165 Type 2 diabetes mellitus with hyperglycemia: Secondary | ICD-10-CM | POA: Diagnosis not present

## 2022-02-15 DIAGNOSIS — I10 Essential (primary) hypertension: Secondary | ICD-10-CM | POA: Diagnosis not present

## 2022-02-15 DIAGNOSIS — E785 Hyperlipidemia, unspecified: Secondary | ICD-10-CM | POA: Diagnosis not present

## 2022-02-17 DIAGNOSIS — I10 Essential (primary) hypertension: Secondary | ICD-10-CM | POA: Diagnosis not present

## 2022-02-17 DIAGNOSIS — Z794 Long term (current) use of insulin: Secondary | ICD-10-CM | POA: Diagnosis not present

## 2022-02-17 DIAGNOSIS — E119 Type 2 diabetes mellitus without complications: Secondary | ICD-10-CM | POA: Diagnosis not present

## 2022-02-17 DIAGNOSIS — F0394 Unspecified dementia, unspecified severity, with anxiety: Secondary | ICD-10-CM | POA: Diagnosis not present

## 2022-02-17 DIAGNOSIS — Z7984 Long term (current) use of oral hypoglycemic drugs: Secondary | ICD-10-CM | POA: Diagnosis not present

## 2022-02-17 DIAGNOSIS — G9341 Metabolic encephalopathy: Secondary | ICD-10-CM | POA: Diagnosis not present

## 2022-02-17 DIAGNOSIS — E785 Hyperlipidemia, unspecified: Secondary | ICD-10-CM | POA: Diagnosis not present

## 2022-02-17 DIAGNOSIS — F03911 Unspecified dementia, unspecified severity, with agitation: Secondary | ICD-10-CM | POA: Diagnosis not present

## 2022-02-20 DIAGNOSIS — G9341 Metabolic encephalopathy: Secondary | ICD-10-CM | POA: Diagnosis not present

## 2022-02-20 DIAGNOSIS — F0394 Unspecified dementia, unspecified severity, with anxiety: Secondary | ICD-10-CM | POA: Diagnosis not present

## 2022-02-20 DIAGNOSIS — E119 Type 2 diabetes mellitus without complications: Secondary | ICD-10-CM | POA: Diagnosis not present

## 2022-02-20 DIAGNOSIS — Z794 Long term (current) use of insulin: Secondary | ICD-10-CM | POA: Diagnosis not present

## 2022-02-20 DIAGNOSIS — F03911 Unspecified dementia, unspecified severity, with agitation: Secondary | ICD-10-CM | POA: Diagnosis not present

## 2022-02-20 DIAGNOSIS — Z7984 Long term (current) use of oral hypoglycemic drugs: Secondary | ICD-10-CM | POA: Diagnosis not present

## 2022-02-20 DIAGNOSIS — I10 Essential (primary) hypertension: Secondary | ICD-10-CM | POA: Diagnosis not present

## 2022-02-20 DIAGNOSIS — E785 Hyperlipidemia, unspecified: Secondary | ICD-10-CM | POA: Diagnosis not present

## 2022-02-21 DIAGNOSIS — Z7984 Long term (current) use of oral hypoglycemic drugs: Secondary | ICD-10-CM | POA: Diagnosis not present

## 2022-02-21 DIAGNOSIS — F03911 Unspecified dementia, unspecified severity, with agitation: Secondary | ICD-10-CM | POA: Diagnosis not present

## 2022-02-21 DIAGNOSIS — G9341 Metabolic encephalopathy: Secondary | ICD-10-CM | POA: Diagnosis not present

## 2022-02-21 DIAGNOSIS — F0394 Unspecified dementia, unspecified severity, with anxiety: Secondary | ICD-10-CM | POA: Diagnosis not present

## 2022-02-21 DIAGNOSIS — E785 Hyperlipidemia, unspecified: Secondary | ICD-10-CM | POA: Diagnosis not present

## 2022-02-21 DIAGNOSIS — Z794 Long term (current) use of insulin: Secondary | ICD-10-CM | POA: Diagnosis not present

## 2022-02-21 DIAGNOSIS — E119 Type 2 diabetes mellitus without complications: Secondary | ICD-10-CM | POA: Diagnosis not present

## 2022-02-21 DIAGNOSIS — I10 Essential (primary) hypertension: Secondary | ICD-10-CM | POA: Diagnosis not present

## 2022-02-22 DIAGNOSIS — E119 Type 2 diabetes mellitus without complications: Secondary | ICD-10-CM | POA: Diagnosis not present

## 2022-02-22 DIAGNOSIS — G9341 Metabolic encephalopathy: Secondary | ICD-10-CM | POA: Diagnosis not present

## 2022-02-22 DIAGNOSIS — I10 Essential (primary) hypertension: Secondary | ICD-10-CM | POA: Diagnosis not present

## 2022-02-22 DIAGNOSIS — Z7984 Long term (current) use of oral hypoglycemic drugs: Secondary | ICD-10-CM | POA: Diagnosis not present

## 2022-02-22 DIAGNOSIS — F03911 Unspecified dementia, unspecified severity, with agitation: Secondary | ICD-10-CM | POA: Diagnosis not present

## 2022-02-22 DIAGNOSIS — Z794 Long term (current) use of insulin: Secondary | ICD-10-CM | POA: Diagnosis not present

## 2022-02-22 DIAGNOSIS — E785 Hyperlipidemia, unspecified: Secondary | ICD-10-CM | POA: Diagnosis not present

## 2022-02-22 DIAGNOSIS — F0394 Unspecified dementia, unspecified severity, with anxiety: Secondary | ICD-10-CM | POA: Diagnosis not present

## 2022-02-27 DIAGNOSIS — Z794 Long term (current) use of insulin: Secondary | ICD-10-CM | POA: Diagnosis not present

## 2022-02-27 DIAGNOSIS — E119 Type 2 diabetes mellitus without complications: Secondary | ICD-10-CM | POA: Diagnosis not present

## 2022-02-27 DIAGNOSIS — F03911 Unspecified dementia, unspecified severity, with agitation: Secondary | ICD-10-CM | POA: Diagnosis not present

## 2022-02-27 DIAGNOSIS — I10 Essential (primary) hypertension: Secondary | ICD-10-CM | POA: Diagnosis not present

## 2022-02-27 DIAGNOSIS — E785 Hyperlipidemia, unspecified: Secondary | ICD-10-CM | POA: Diagnosis not present

## 2022-02-27 DIAGNOSIS — G9341 Metabolic encephalopathy: Secondary | ICD-10-CM | POA: Diagnosis not present

## 2022-02-27 DIAGNOSIS — F0394 Unspecified dementia, unspecified severity, with anxiety: Secondary | ICD-10-CM | POA: Diagnosis not present

## 2022-02-27 DIAGNOSIS — Z7984 Long term (current) use of oral hypoglycemic drugs: Secondary | ICD-10-CM | POA: Diagnosis not present

## 2022-02-28 DIAGNOSIS — F03911 Unspecified dementia, unspecified severity, with agitation: Secondary | ICD-10-CM | POA: Diagnosis not present

## 2022-02-28 DIAGNOSIS — E785 Hyperlipidemia, unspecified: Secondary | ICD-10-CM | POA: Diagnosis not present

## 2022-02-28 DIAGNOSIS — E119 Type 2 diabetes mellitus without complications: Secondary | ICD-10-CM | POA: Diagnosis not present

## 2022-02-28 DIAGNOSIS — G9341 Metabolic encephalopathy: Secondary | ICD-10-CM | POA: Diagnosis not present

## 2022-02-28 DIAGNOSIS — I10 Essential (primary) hypertension: Secondary | ICD-10-CM | POA: Diagnosis not present

## 2022-02-28 DIAGNOSIS — Z7984 Long term (current) use of oral hypoglycemic drugs: Secondary | ICD-10-CM | POA: Diagnosis not present

## 2022-02-28 DIAGNOSIS — Z794 Long term (current) use of insulin: Secondary | ICD-10-CM | POA: Diagnosis not present

## 2022-02-28 DIAGNOSIS — F0394 Unspecified dementia, unspecified severity, with anxiety: Secondary | ICD-10-CM | POA: Diagnosis not present

## 2022-03-06 DIAGNOSIS — Z7984 Long term (current) use of oral hypoglycemic drugs: Secondary | ICD-10-CM | POA: Diagnosis not present

## 2022-03-06 DIAGNOSIS — F03911 Unspecified dementia, unspecified severity, with agitation: Secondary | ICD-10-CM | POA: Diagnosis not present

## 2022-03-06 DIAGNOSIS — I10 Essential (primary) hypertension: Secondary | ICD-10-CM | POA: Diagnosis not present

## 2022-03-06 DIAGNOSIS — E119 Type 2 diabetes mellitus without complications: Secondary | ICD-10-CM | POA: Diagnosis not present

## 2022-03-06 DIAGNOSIS — F0394 Unspecified dementia, unspecified severity, with anxiety: Secondary | ICD-10-CM | POA: Diagnosis not present

## 2022-03-06 DIAGNOSIS — G9341 Metabolic encephalopathy: Secondary | ICD-10-CM | POA: Diagnosis not present

## 2022-03-06 DIAGNOSIS — Z794 Long term (current) use of insulin: Secondary | ICD-10-CM | POA: Diagnosis not present

## 2022-03-06 DIAGNOSIS — E785 Hyperlipidemia, unspecified: Secondary | ICD-10-CM | POA: Diagnosis not present

## 2022-03-07 DIAGNOSIS — E1165 Type 2 diabetes mellitus with hyperglycemia: Secondary | ICD-10-CM | POA: Diagnosis not present

## 2022-03-13 DIAGNOSIS — E119 Type 2 diabetes mellitus without complications: Secondary | ICD-10-CM | POA: Diagnosis not present

## 2022-03-13 DIAGNOSIS — Z794 Long term (current) use of insulin: Secondary | ICD-10-CM | POA: Diagnosis not present

## 2022-03-13 DIAGNOSIS — E785 Hyperlipidemia, unspecified: Secondary | ICD-10-CM | POA: Diagnosis not present

## 2022-03-13 DIAGNOSIS — I10 Essential (primary) hypertension: Secondary | ICD-10-CM | POA: Diagnosis not present

## 2022-03-13 DIAGNOSIS — Z7984 Long term (current) use of oral hypoglycemic drugs: Secondary | ICD-10-CM | POA: Diagnosis not present

## 2022-03-13 DIAGNOSIS — F03911 Unspecified dementia, unspecified severity, with agitation: Secondary | ICD-10-CM | POA: Diagnosis not present

## 2022-03-13 DIAGNOSIS — G9341 Metabolic encephalopathy: Secondary | ICD-10-CM | POA: Diagnosis not present

## 2022-03-13 DIAGNOSIS — F0394 Unspecified dementia, unspecified severity, with anxiety: Secondary | ICD-10-CM | POA: Diagnosis not present

## 2022-03-15 DIAGNOSIS — I10 Essential (primary) hypertension: Secondary | ICD-10-CM | POA: Diagnosis not present

## 2022-03-15 DIAGNOSIS — L03116 Cellulitis of left lower limb: Secondary | ICD-10-CM | POA: Diagnosis not present

## 2022-03-15 DIAGNOSIS — E1165 Type 2 diabetes mellitus with hyperglycemia: Secondary | ICD-10-CM | POA: Diagnosis not present

## 2022-03-21 DIAGNOSIS — E785 Hyperlipidemia, unspecified: Secondary | ICD-10-CM | POA: Diagnosis not present

## 2022-03-21 DIAGNOSIS — F0394 Unspecified dementia, unspecified severity, with anxiety: Secondary | ICD-10-CM | POA: Diagnosis not present

## 2022-03-21 DIAGNOSIS — Z7984 Long term (current) use of oral hypoglycemic drugs: Secondary | ICD-10-CM | POA: Diagnosis not present

## 2022-03-21 DIAGNOSIS — I10 Essential (primary) hypertension: Secondary | ICD-10-CM | POA: Diagnosis not present

## 2022-03-21 DIAGNOSIS — G9341 Metabolic encephalopathy: Secondary | ICD-10-CM | POA: Diagnosis not present

## 2022-03-21 DIAGNOSIS — Z794 Long term (current) use of insulin: Secondary | ICD-10-CM | POA: Diagnosis not present

## 2022-03-21 DIAGNOSIS — E119 Type 2 diabetes mellitus without complications: Secondary | ICD-10-CM | POA: Diagnosis not present

## 2022-03-21 DIAGNOSIS — F03911 Unspecified dementia, unspecified severity, with agitation: Secondary | ICD-10-CM | POA: Diagnosis not present

## 2022-03-22 DIAGNOSIS — E1165 Type 2 diabetes mellitus with hyperglycemia: Secondary | ICD-10-CM | POA: Diagnosis not present

## 2022-03-22 DIAGNOSIS — L03116 Cellulitis of left lower limb: Secondary | ICD-10-CM | POA: Diagnosis not present

## 2022-03-22 DIAGNOSIS — I10 Essential (primary) hypertension: Secondary | ICD-10-CM | POA: Diagnosis not present

## 2022-03-29 DIAGNOSIS — E1165 Type 2 diabetes mellitus with hyperglycemia: Secondary | ICD-10-CM | POA: Diagnosis not present

## 2022-03-29 DIAGNOSIS — R451 Restlessness and agitation: Secondary | ICD-10-CM | POA: Diagnosis not present

## 2022-03-29 DIAGNOSIS — I10 Essential (primary) hypertension: Secondary | ICD-10-CM | POA: Diagnosis not present

## 2022-04-19 DIAGNOSIS — U071 COVID-19: Secondary | ICD-10-CM | POA: Diagnosis not present

## 2022-04-19 DIAGNOSIS — R0981 Nasal congestion: Secondary | ICD-10-CM | POA: Diagnosis not present

## 2022-04-26 DIAGNOSIS — R451 Restlessness and agitation: Secondary | ICD-10-CM | POA: Diagnosis not present

## 2022-04-26 DIAGNOSIS — U071 COVID-19: Secondary | ICD-10-CM | POA: Diagnosis not present

## 2022-04-26 DIAGNOSIS — M6281 Muscle weakness (generalized): Secondary | ICD-10-CM | POA: Diagnosis not present

## 2022-04-26 DIAGNOSIS — R2681 Unsteadiness on feet: Secondary | ICD-10-CM | POA: Diagnosis not present

## 2022-05-16 DIAGNOSIS — E1159 Type 2 diabetes mellitus with other circulatory complications: Secondary | ICD-10-CM | POA: Diagnosis not present

## 2022-05-16 DIAGNOSIS — B351 Tinea unguium: Secondary | ICD-10-CM | POA: Diagnosis not present

## 2022-05-17 DIAGNOSIS — G9341 Metabolic encephalopathy: Secondary | ICD-10-CM | POA: Diagnosis not present

## 2022-05-17 DIAGNOSIS — E1165 Type 2 diabetes mellitus with hyperglycemia: Secondary | ICD-10-CM | POA: Diagnosis not present

## 2022-05-17 DIAGNOSIS — I10 Essential (primary) hypertension: Secondary | ICD-10-CM | POA: Diagnosis not present

## 2022-05-22 DIAGNOSIS — E119 Type 2 diabetes mellitus without complications: Secondary | ICD-10-CM | POA: Diagnosis not present

## 2022-05-24 DIAGNOSIS — G9341 Metabolic encephalopathy: Secondary | ICD-10-CM | POA: Diagnosis not present

## 2022-05-24 DIAGNOSIS — E1165 Type 2 diabetes mellitus with hyperglycemia: Secondary | ICD-10-CM | POA: Diagnosis not present

## 2022-05-24 DIAGNOSIS — I10 Essential (primary) hypertension: Secondary | ICD-10-CM | POA: Diagnosis not present

## 2022-06-07 DIAGNOSIS — L03116 Cellulitis of left lower limb: Secondary | ICD-10-CM | POA: Diagnosis not present

## 2022-06-07 DIAGNOSIS — I10 Essential (primary) hypertension: Secondary | ICD-10-CM | POA: Diagnosis not present

## 2022-06-07 DIAGNOSIS — R2681 Unsteadiness on feet: Secondary | ICD-10-CM | POA: Diagnosis not present

## 2022-06-07 DIAGNOSIS — E1165 Type 2 diabetes mellitus with hyperglycemia: Secondary | ICD-10-CM | POA: Diagnosis not present

## 2022-06-10 DIAGNOSIS — E785 Hyperlipidemia, unspecified: Secondary | ICD-10-CM | POA: Diagnosis not present

## 2022-06-10 DIAGNOSIS — E559 Vitamin D deficiency, unspecified: Secondary | ICD-10-CM | POA: Diagnosis not present

## 2022-06-10 DIAGNOSIS — I10 Essential (primary) hypertension: Secondary | ICD-10-CM | POA: Diagnosis not present

## 2022-06-10 DIAGNOSIS — D519 Vitamin B12 deficiency anemia, unspecified: Secondary | ICD-10-CM | POA: Diagnosis not present

## 2022-06-10 DIAGNOSIS — E1165 Type 2 diabetes mellitus with hyperglycemia: Secondary | ICD-10-CM | POA: Diagnosis not present

## 2022-06-21 DIAGNOSIS — E785 Hyperlipidemia, unspecified: Secondary | ICD-10-CM | POA: Diagnosis not present

## 2022-06-21 DIAGNOSIS — E039 Hypothyroidism, unspecified: Secondary | ICD-10-CM | POA: Diagnosis not present

## 2022-06-21 DIAGNOSIS — E1165 Type 2 diabetes mellitus with hyperglycemia: Secondary | ICD-10-CM | POA: Diagnosis not present

## 2022-06-21 DIAGNOSIS — I10 Essential (primary) hypertension: Secondary | ICD-10-CM | POA: Diagnosis not present

## 2022-06-23 ENCOUNTER — Encounter: Payer: Self-pay | Admitting: Family Medicine

## 2022-06-23 ENCOUNTER — Non-Acute Institutional Stay: Payer: Medicare Other | Admitting: Family Medicine

## 2022-06-23 DIAGNOSIS — F02818 Dementia in other diseases classified elsewhere, unspecified severity, with other behavioral disturbance: Secondary | ICD-10-CM | POA: Diagnosis not present

## 2022-06-23 DIAGNOSIS — G309 Alzheimer's disease, unspecified: Secondary | ICD-10-CM | POA: Diagnosis not present

## 2022-06-23 DIAGNOSIS — E1165 Type 2 diabetes mellitus with hyperglycemia: Secondary | ICD-10-CM | POA: Insufficient documentation

## 2022-06-23 DIAGNOSIS — Z794 Long term (current) use of insulin: Secondary | ICD-10-CM | POA: Diagnosis not present

## 2022-06-23 NOTE — Progress Notes (Signed)
Therapist, nutritional Palliative Care Consult Note Telephone: [phone]  Fax: [phone]    Date of encounter: 06/23/22 1:30 PM PATIENT NAME: Mary Swanson [address] [place] [address]   [phone] (home)  DOB: [birth year] MRN: [id number] PRIMARY CARE PROVIDER:    Elberta Fortis, MD,  [address] [place] [address] [phone]  REFERRING PROVIDER:   Elberta Fortis, MD [address] [place],  [place] [address] [phone]  RESPONSIBLE PARTY:    Contact Information     Name Relation Home Work Mobile   Swanson,Mary Friend   [phone]   Swanson,Mary Neighbor   [phone]      Paperwork on chart indicates that Mary Swanson and Mary Swanson are court appointed guardians of the patient.  I met face to face with patient in the AL facility. Palliative Care was asked to follow this patient by consultation request of  Mary Fortis, MD to address advance care planning and complex medical decision making. This is a follow up visit.                                  ASSESSMENT, SYMPTOM MANAGEMENT AND PLAN / RECOMMENDATIONS:   Diabetes Mellitus type 2 with hyperglycemia- Hyperglycemia significantly improved with HGB A1c 6.8% 03/09/22. Continue ACHS blood sugars Lantus 30 units QHS, sliding scale Lispro for before meal blood sugars, continue Metformin 1000 mg daily.   Facility does not have diabetic diet capability.  Alzheimer's Dementia- FAST 7 score 7b Continue Lorazepam 0.5 mg BID prn for agitation.     Advance Care Planning/Goals of Care: Court has appointed Mary Swanson and Mary Swanson as legal guardians for patient. Need to address code status and goals of care with them.  Per nurse at facility, pt's son has requested not to be called at all and has no contact with pt.  CODE STATUS: DNR 01/04/22 MOST as of 01/06/22: DNR/DNI with limited additional intervention Use of antibiotics and IV fluids on a case by case, time limited basis No  feeding tube.     Follow up Palliative Care Visit: Palliative care will continue to follow for complex medical decision making, advance care planning, and clarification of goals. Return 4 weeks or prn.    This visit was coded based on medical decision making (MDM).  PPS: 60%  HOSPICE ELIGIBILITY/DIAGNOSIS: TBD  Chief Complaint:  Palliative Care is following for chronic medical management in setting of dementia.  HISTORY OF PRESENT ILLNESS:  Mary Swanson is a 66 y.o. year old female with Alzheimer's Dementia with intermittent agitation, DM and HTN.  Per staff pt is incontinent of bowel and bladder, has had no falls and ambulates independently. She requires assistance with bathing, dressing and personal grooming.  History obtained from review of EMR, discussion with primary team, and interview with facility staff/caregiver and Mary Swanson.   03/09/22 HGB A1c 6.8% 01/26/22 BMP remarkable for elevated glucose 137, CBC unremarkable  FSBS  118-,200, isolated 311  I reviewed available labs, medications, imaging, studies and related documents from the EMR.  Records reviewed and summarized above.   ROS Unable to obtain from pt due to advanced dementia   Physical Exam: limited as pt is continuously walking, won't pause and allow exam Current and past weights:La 179 lbs 3.7 oz as of 06/17/21, 169 lbs as of 06/12/22 Constitutional: NAD, Seen continuously ambulating on the unit, not wearing shoes General: WN overweight female seen ambulatory  on unit CV: no visible cyanosis or edema Pulmonary: No increased work of breathing, no cough, room air and tolerates normal to fast walk without dyspnea or tachypnea Abdomen: no visible distension MSK: no sarcopenia, moves all extremities, ambulatory with steady gait Skin: warm and dry, no rashes or wounds visible on hands, face or feet Neuro:  no generalized weakness Psych: non-anxious affect, disoriented-doesn't respond to name being called, answers  "yeah" to any questions asked    Thank you for the opportunity to participate in the care of Mary Swanson.  The palliative care team will continue to follow. Please call our office at [phone] if we can be of additional assistance.   Mary Conception, FNP -C  COVID-19 PATIENT SCREENING TOOL Asked and negative response unless otherwise noted:   Have you had symptoms of covid, tested positive or been in contact with someone with symptoms/positive test in the past 5-10 days?  no

## 2022-06-27 DIAGNOSIS — E119 Type 2 diabetes mellitus without complications: Secondary | ICD-10-CM | POA: Diagnosis not present

## 2022-06-27 DIAGNOSIS — Z794 Long term (current) use of insulin: Secondary | ICD-10-CM | POA: Diagnosis not present

## 2022-06-28 DIAGNOSIS — H00026 Hordeolum internum left eye, unspecified eyelid: Secondary | ICD-10-CM | POA: Diagnosis not present

## 2022-07-05 DIAGNOSIS — H00026 Hordeolum internum left eye, unspecified eyelid: Secondary | ICD-10-CM | POA: Diagnosis not present

## 2022-07-19 DIAGNOSIS — E1165 Type 2 diabetes mellitus with hyperglycemia: Secondary | ICD-10-CM | POA: Diagnosis not present

## 2022-07-19 DIAGNOSIS — R39198 Other difficulties with micturition: Secondary | ICD-10-CM | POA: Diagnosis not present

## 2022-07-20 DIAGNOSIS — R3915 Urgency of urination: Secondary | ICD-10-CM | POA: Diagnosis not present

## 2022-07-26 DIAGNOSIS — R39198 Other difficulties with micturition: Secondary | ICD-10-CM | POA: Diagnosis not present

## 2022-07-26 DIAGNOSIS — N390 Urinary tract infection, site not specified: Secondary | ICD-10-CM | POA: Diagnosis not present

## 2022-07-28 DIAGNOSIS — E119 Type 2 diabetes mellitus without complications: Secondary | ICD-10-CM | POA: Diagnosis not present

## 2022-07-28 DIAGNOSIS — Z794 Long term (current) use of insulin: Secondary | ICD-10-CM | POA: Diagnosis not present

## 2022-08-16 DIAGNOSIS — I10 Essential (primary) hypertension: Secondary | ICD-10-CM | POA: Diagnosis not present

## 2022-08-16 DIAGNOSIS — E785 Hyperlipidemia, unspecified: Secondary | ICD-10-CM | POA: Diagnosis not present

## 2022-08-16 DIAGNOSIS — E1165 Type 2 diabetes mellitus with hyperglycemia: Secondary | ICD-10-CM | POA: Diagnosis not present

## 2022-08-16 DIAGNOSIS — G9341 Metabolic encephalopathy: Secondary | ICD-10-CM | POA: Diagnosis not present

## 2022-08-23 DIAGNOSIS — E119 Type 2 diabetes mellitus without complications: Secondary | ICD-10-CM | POA: Diagnosis not present

## 2022-08-28 DIAGNOSIS — Z794 Long term (current) use of insulin: Secondary | ICD-10-CM | POA: Diagnosis not present

## 2022-08-28 DIAGNOSIS — E119 Type 2 diabetes mellitus without complications: Secondary | ICD-10-CM | POA: Diagnosis not present

## 2022-09-02 DIAGNOSIS — E1159 Type 2 diabetes mellitus with other circulatory complications: Secondary | ICD-10-CM | POA: Diagnosis not present

## 2022-09-02 DIAGNOSIS — B351 Tinea unguium: Secondary | ICD-10-CM | POA: Diagnosis not present

## 2022-09-14 DIAGNOSIS — E1165 Type 2 diabetes mellitus with hyperglycemia: Secondary | ICD-10-CM | POA: Diagnosis not present

## 2022-09-20 DIAGNOSIS — E1165 Type 2 diabetes mellitus with hyperglycemia: Secondary | ICD-10-CM | POA: Diagnosis not present

## 2022-09-27 DIAGNOSIS — Z794 Long term (current) use of insulin: Secondary | ICD-10-CM | POA: Diagnosis not present

## 2022-09-27 DIAGNOSIS — E119 Type 2 diabetes mellitus without complications: Secondary | ICD-10-CM | POA: Diagnosis not present

## 2022-10-23 ENCOUNTER — Non-Acute Institutional Stay: Payer: Medicare Other | Admitting: Family Medicine

## 2022-10-23 DIAGNOSIS — E1165 Type 2 diabetes mellitus with hyperglycemia: Secondary | ICD-10-CM | POA: Diagnosis not present

## 2022-10-23 DIAGNOSIS — F02818 Dementia in other diseases classified elsewhere, unspecified severity, with other behavioral disturbance: Secondary | ICD-10-CM | POA: Diagnosis not present

## 2022-10-23 DIAGNOSIS — G309 Alzheimer's disease, unspecified: Secondary | ICD-10-CM

## 2022-10-23 DIAGNOSIS — Z794 Long term (current) use of insulin: Secondary | ICD-10-CM | POA: Diagnosis not present

## 2022-10-23 NOTE — Progress Notes (Signed)
[facility] Palliative Care Consult Note Telephone: [phone]  Fax: [phone]    Date of encounter: 10/23/22 5:19 PM PATIENT NAME: Mary Swanson [place] [address]   [phone] (home)  DOB: 1956-09-02 MRN: [id number] PRIMARY CARE PROVIDER:    Colletta Maryland, MD,  [place] [address] [phone]  REFERRING PROVIDER:   Colletta Maryland, [place],  [place] [address] [phone]  RESPONSIBLE PARTY:    Contact Information     Name Relation Home Work [place]   [phone]   Ronnell [place]   [phone]        I met face to face with patient in [facility] assisted living facility. Palliative Care was asked to follow this patient by consultation request of  Colletta [place], MD to address advance care planning and complex medical decision making. This is a follow up visit   ASSESSMENT , SYMPTOM MANAGEMENT AND PLAN / RECOMMENDATIONS:    Alzheimer's dementia with behavioral disturbance Fast 7 score 7a  2.     Type 2 DM with hyperglycemia Staff reports that pt does take her medication without difficulty Continues Lantus 30 units at HS, sliding scale lispro before each meal and Metformin 1000 mg daily with breakfast    Advance Care Planning/Goals of Care: Goals include to maximize quality of life and symptom management.  Identification of a healthcare agent-Phyllis Cobb Review of an advance directive document-MOST. Decision not to resuscitate or to de-escalate disease focused treatments due to poor prognosis. CODE STATUS: MOST as of 01/06/22: DNR/DNI with limited additional intervention Use of antibiotics and IV fluids on a case by case, time limited basis No feeding tube.      Follow up Palliative Care Visit: Palliative care will continue to follow for complex medical decision making, advance care planning, and clarification of goals. Return  4-8 weeks or prn.   This visit was coded based on medical decision making (MDM).  PPS: 50%   HOSPICE ELIGIBILITY/DIAGNOSIS: TBD  Chief Complaint:  Palliative Care is continuing to follow patient for chronic medical management in setting of dementia.   HISTORY OF PRESENT ILLNESS:  Mary Swanson is a 66 y.o. year old female with dementia with intermittent agitation.  She also has HTN, uncontrolled type 2 DM with hyperglycemia and long term current use of insulin, HLD and anemia. She was noted to be ambulating around the facility after dinner.  She appears comfortable.  Noted that she ambulated into another resident's room and began messing with the covers on her bed.  When redirected that this was not her room she bent over and picked up a multi unit power adapter and was trying to remove the plug for a lamp.  With concern that she might receive an electrical shock attempts were made to redirect her from the room which took a few minutes.  She never responded verbally to myself or the resident whose room she wandered into. Blood sugars were not noted on main chart for patient.  She continues to receive insulin per facility staff.    History obtained from review of EMR, discussion with facility staff and/or Ms. Mccrumb.   I reviewed EMR and was unable to see notes from primary provider who comes to the facility for available labs, medications, imaging, studies and related documents.  There are no new records since last visit in available EMR-Epic.   ROS Does not verbally respond to any questions  with identifiable words  Physical Exam: limited as pt won't stop ambulating for assessment Current and past weights: no recent weight noted Constitutional: NAD General:WN, WD  CV: S1S2, RRR, no LE edema Pulmonary: CTAB, no increased work of breathing, no cough, room air Abdomen: normo-active BS + 4 quadrants, soft and non tender MSK: no sarcopenia, moves all extremities, ambulatory Skin: warm and  dry, no rashes or wounds on visible skin Neuro:  no generalized weakness,  noted cognitive impairment Psych: non-anxious affect, confused and inappropriate behaviors Hem/lymph/immuno: no widespread bruising   Thank you for the opportunity to participate in the care of Ms. Smelcer.  The palliative care team will continue to follow. Please call our office at [phone] if we can be of additional assistance.   Marijo Conception, FNP -C  COVID-19 PATIENT SCREENING TOOL Asked and negative response unless otherwise noted:   Have you had symptoms of covid, tested positive or been in contact with someone with symptoms/positive test in the past 5-10 days?  no

## 2022-10-26 ENCOUNTER — Encounter: Payer: Self-pay | Admitting: Family Medicine

## 2022-10-27 DIAGNOSIS — Z794 Long term (current) use of insulin: Secondary | ICD-10-CM | POA: Diagnosis not present

## 2022-10-27 DIAGNOSIS — E119 Type 2 diabetes mellitus without complications: Secondary | ICD-10-CM | POA: Diagnosis not present

## 2022-11-01 DIAGNOSIS — E785 Hyperlipidemia, unspecified: Secondary | ICD-10-CM | POA: Diagnosis not present

## 2022-11-01 DIAGNOSIS — E1165 Type 2 diabetes mellitus with hyperglycemia: Secondary | ICD-10-CM | POA: Diagnosis not present

## 2022-11-01 DIAGNOSIS — I10 Essential (primary) hypertension: Secondary | ICD-10-CM | POA: Diagnosis not present

## 2022-11-08 DIAGNOSIS — I10 Essential (primary) hypertension: Secondary | ICD-10-CM | POA: Diagnosis not present

## 2022-11-08 DIAGNOSIS — E039 Hypothyroidism, unspecified: Secondary | ICD-10-CM | POA: Diagnosis not present

## 2022-11-08 DIAGNOSIS — G9341 Metabolic encephalopathy: Secondary | ICD-10-CM | POA: Diagnosis not present

## 2022-11-08 DIAGNOSIS — E1165 Type 2 diabetes mellitus with hyperglycemia: Secondary | ICD-10-CM | POA: Diagnosis not present

## 2022-11-11 DIAGNOSIS — E1159 Type 2 diabetes mellitus with other circulatory complications: Secondary | ICD-10-CM | POA: Diagnosis not present

## 2022-11-11 DIAGNOSIS — B351 Tinea unguium: Secondary | ICD-10-CM | POA: Diagnosis not present

## 2022-11-15 DIAGNOSIS — E1165 Type 2 diabetes mellitus with hyperglycemia: Secondary | ICD-10-CM | POA: Diagnosis not present

## 2022-11-23 DIAGNOSIS — E119 Type 2 diabetes mellitus without complications: Secondary | ICD-10-CM | POA: Diagnosis not present

## 2022-11-29 DIAGNOSIS — I10 Essential (primary) hypertension: Secondary | ICD-10-CM | POA: Diagnosis not present

## 2022-11-29 DIAGNOSIS — E785 Hyperlipidemia, unspecified: Secondary | ICD-10-CM | POA: Diagnosis not present

## 2022-11-29 DIAGNOSIS — Z794 Long term (current) use of insulin: Secondary | ICD-10-CM | POA: Diagnosis not present

## 2022-11-29 DIAGNOSIS — E1165 Type 2 diabetes mellitus with hyperglycemia: Secondary | ICD-10-CM | POA: Diagnosis not present

## 2022-11-29 DIAGNOSIS — E119 Type 2 diabetes mellitus without complications: Secondary | ICD-10-CM | POA: Diagnosis not present

## 2022-12-13 DIAGNOSIS — E1165 Type 2 diabetes mellitus with hyperglycemia: Secondary | ICD-10-CM | POA: Diagnosis not present

## 2022-12-28 DIAGNOSIS — E119 Type 2 diabetes mellitus without complications: Secondary | ICD-10-CM | POA: Diagnosis not present

## 2022-12-28 DIAGNOSIS — Z794 Long term (current) use of insulin: Secondary | ICD-10-CM | POA: Diagnosis not present

## 2023-01-03 DIAGNOSIS — E1165 Type 2 diabetes mellitus with hyperglycemia: Secondary | ICD-10-CM | POA: Diagnosis not present

## 2023-01-08 ENCOUNTER — Non-Acute Institutional Stay: Payer: Medicare Other | Admitting: Family Medicine

## 2023-01-08 DIAGNOSIS — Z515 Encounter for palliative care: Secondary | ICD-10-CM | POA: Diagnosis not present

## 2023-01-08 DIAGNOSIS — F02818 Dementia in other diseases classified elsewhere, unspecified severity, with other behavioral disturbance: Secondary | ICD-10-CM

## 2023-01-08 DIAGNOSIS — G309 Alzheimer's disease, unspecified: Secondary | ICD-10-CM | POA: Diagnosis not present

## 2023-01-09 ENCOUNTER — Encounter: Payer: Self-pay | Admitting: Family Medicine

## 2023-01-09 DIAGNOSIS — Z515 Encounter for palliative care: Secondary | ICD-10-CM | POA: Insufficient documentation

## 2023-01-09 NOTE — Progress Notes (Signed)
[facility] Palliative Care Consult Note Telephone: [phone]  Fax: [phone]    Date of encounter: 01/08/23 3:45 pm PATIENT NAME: Mary Swanson [id number]   [phone] (home)  DOB: Dec 13, 1955 MRN: [id number] PRIMARY CARE PROVIDER:    Colletta Maryland, MD,  [place] [address] [phone]  REFERRING PROVIDER:   Colletta Maryland, [place],  [place] [address] [phone]  RESPONSIBLE PARTY:    Contact Information     Name Relation Home Work [place]   [phone]   Ronnell [place]   [phone]        I met face to face with patient in [facility] assisted living facility. Palliative Care was asked to follow this patient by consultation request of  Colletta Maryland, MD to address advance care planning and complex medical decision making. This is a follow up visit   ASSESSMENT , SYMPTOM MANAGEMENT AND PLAN / RECOMMENDATIONS:    Alzheimer's dementia with behavioral disturbance Fast 7 score 7b Slight progression with significant aphasia, no noted weight loss and continued alertness. No current meds for dementia   2.  Palliative Care Encounter Would recommend de-escalation of non-essential meds like Thiamine which when last measured was supratherapeutic, Rosuvastatin as long term benefit vs extended life span not as likely. Would reserve for most essential meds being for control of DM.    Advance Care Planning/Goals of Care: Goals include to maximize quality of life and symptom management.  Identification of a healthcare agent-Phyllis Cobb Review of an advance directive document-MOST. Decision not to resuscitate or to de-escalate disease focused treatments due to poor prognosis. CODE STATUS: MOST as of 01/06/22: DNR/DNI with limited additional intervention Use of antibiotics and IV fluids on a case by case, time limited basis No feeding  tube.      Follow up Palliative Care Visit: Palliative care will continue to follow for complex medical decision making, advance care planning, and clarification of goals. Return 6-8 weeks or prn.   This visit was coded based on medical decision making (MDM).  PPS: 50%   HOSPICE ELIGIBILITY/DIAGNOSIS: TBD  Chief Complaint:  Palliative Care is continuing to follow patient for chronic medical management in setting of dementia.   HISTORY OF PRESENT ILLNESS:  Mary Swanson is a 67 y.o. year old female with dementia with intermittent agitation.  She also has HTN, uncontrolled type 2 DM with hyperglycemia and long term current use of insulin, HLD and anemia. She was noted to be ambulating around the facility which staff indicates she does continuously when awake.  She appears comfortable and staff indicates she eats well.  No visible evidence noted of malnutrition. She never responded verbally but would nod head "yes" to pretty much any question asked.  She was trying different doors to open but did indicate yes when asked if she needed to go to the bathroom. She continues to receive insulin per facility staff records. She is incontinent of bowel and bladder.   History obtained from review of EMR, discussion with facility staff and/or Mary Swanson.   I reviewed EMR with no new records since last visit and was unable to see notes from primary provider in facility chart.  ROS Does not verbally respond to any questions with identifiable words  Physical Exam: limited as pt won't stop ambulating for assessment/poorly tolerant by trying to move away from examiner Current and past weights: no recent weight noted Constitutional:  NAD General: WN, WD CV: Unable to auscultate heart, palpated pulse appears regular for short period pt allowed contact.  Ambulates continuously around the facility without having to stop to breathe, no noted cyanosis, edema Pulmonary: No increased work of breathing, no cough  or audible wheeze room air Abdomen: Abd soft and non tender MSK: no sarcopenia, moves all extremities, ambulatory Skin: warm and dry, no rashes or wounds on visible skin Neuro:  no generalized weakness,  noted profound cognitive impairment with likely receptive and expressive aphasia, unable to follow instruction Psych: non-anxious affect, confused  Hem/lymph/immuno: no widespread bruising   Thank you for the opportunity to participate in the care of Mary Swanson.  The palliative care team will continue to follow. Please call our office at [phone] if we can be of additional assistance.   Marijo Conception, FNP -C  COVID-19 PATIENT SCREENING TOOL Asked and negative response unless otherwise noted:   Have you had symptoms of covid, tested positive or been in contact with someone with symptoms/positive test in the past 5-10 days?  no

## 2023-01-13 DIAGNOSIS — E1159 Type 2 diabetes mellitus with other circulatory complications: Secondary | ICD-10-CM | POA: Diagnosis not present

## 2023-01-13 DIAGNOSIS — B351 Tinea unguium: Secondary | ICD-10-CM | POA: Diagnosis not present

## 2023-01-17 DIAGNOSIS — R3915 Urgency of urination: Secondary | ICD-10-CM | POA: Diagnosis not present

## 2023-01-19 DIAGNOSIS — D519 Vitamin B12 deficiency anemia, unspecified: Secondary | ICD-10-CM | POA: Diagnosis not present

## 2023-01-19 DIAGNOSIS — E559 Vitamin D deficiency, unspecified: Secondary | ICD-10-CM | POA: Diagnosis not present

## 2023-01-19 DIAGNOSIS — E785 Hyperlipidemia, unspecified: Secondary | ICD-10-CM | POA: Diagnosis not present

## 2023-01-19 DIAGNOSIS — I10 Essential (primary) hypertension: Secondary | ICD-10-CM | POA: Diagnosis not present

## 2023-01-19 DIAGNOSIS — E039 Hypothyroidism, unspecified: Secondary | ICD-10-CM | POA: Diagnosis not present

## 2023-01-22 DIAGNOSIS — N390 Urinary tract infection, site not specified: Secondary | ICD-10-CM | POA: Diagnosis not present

## 2023-01-24 DIAGNOSIS — E1165 Type 2 diabetes mellitus with hyperglycemia: Secondary | ICD-10-CM | POA: Diagnosis not present

## 2023-01-24 DIAGNOSIS — E785 Hyperlipidemia, unspecified: Secondary | ICD-10-CM | POA: Diagnosis not present

## 2023-01-24 DIAGNOSIS — N390 Urinary tract infection, site not specified: Secondary | ICD-10-CM | POA: Diagnosis not present

## 2023-01-24 DIAGNOSIS — I10 Essential (primary) hypertension: Secondary | ICD-10-CM | POA: Diagnosis not present

## 2023-01-24 DIAGNOSIS — D72829 Elevated white blood cell count, unspecified: Secondary | ICD-10-CM | POA: Diagnosis not present

## 2023-01-24 DIAGNOSIS — Z794 Long term (current) use of insulin: Secondary | ICD-10-CM | POA: Diagnosis not present

## 2023-01-26 DIAGNOSIS — E119 Type 2 diabetes mellitus without complications: Secondary | ICD-10-CM | POA: Diagnosis not present

## 2023-01-26 DIAGNOSIS — Z794 Long term (current) use of insulin: Secondary | ICD-10-CM | POA: Diagnosis not present

## 2023-02-03 DIAGNOSIS — F02A0 Dementia in other diseases classified elsewhere, mild, without behavioral disturbance, psychotic disturbance, mood disturbance, and anxiety: Secondary | ICD-10-CM | POA: Diagnosis not present

## 2023-02-03 DIAGNOSIS — E785 Hyperlipidemia, unspecified: Secondary | ICD-10-CM | POA: Diagnosis not present

## 2023-02-03 DIAGNOSIS — M6281 Muscle weakness (generalized): Secondary | ICD-10-CM | POA: Diagnosis not present

## 2023-02-03 DIAGNOSIS — I10 Essential (primary) hypertension: Secondary | ICD-10-CM | POA: Diagnosis not present

## 2023-02-03 DIAGNOSIS — E1165 Type 2 diabetes mellitus with hyperglycemia: Secondary | ICD-10-CM | POA: Diagnosis not present

## 2023-02-07 DIAGNOSIS — R451 Restlessness and agitation: Secondary | ICD-10-CM | POA: Diagnosis not present

## 2023-02-07 DIAGNOSIS — F02A18 Dementia in other diseases classified elsewhere, mild, with other behavioral disturbance: Secondary | ICD-10-CM | POA: Diagnosis not present

## 2023-02-07 DIAGNOSIS — I10 Essential (primary) hypertension: Secondary | ICD-10-CM | POA: Diagnosis not present

## 2023-02-15 DIAGNOSIS — D72829 Elevated white blood cell count, unspecified: Secondary | ICD-10-CM | POA: Diagnosis not present

## 2023-02-23 DIAGNOSIS — E119 Type 2 diabetes mellitus without complications: Secondary | ICD-10-CM | POA: Diagnosis not present

## 2023-02-26 DIAGNOSIS — E119 Type 2 diabetes mellitus without complications: Secondary | ICD-10-CM | POA: Diagnosis not present

## 2023-02-26 DIAGNOSIS — Z794 Long term (current) use of insulin: Secondary | ICD-10-CM | POA: Diagnosis not present

## 2023-02-28 DIAGNOSIS — D72829 Elevated white blood cell count, unspecified: Secondary | ICD-10-CM | POA: Diagnosis not present

## 2023-03-14 DIAGNOSIS — I10 Essential (primary) hypertension: Secondary | ICD-10-CM | POA: Diagnosis not present

## 2023-03-14 DIAGNOSIS — E1165 Type 2 diabetes mellitus with hyperglycemia: Secondary | ICD-10-CM | POA: Diagnosis not present

## 2023-03-15 DIAGNOSIS — B351 Tinea unguium: Secondary | ICD-10-CM | POA: Diagnosis not present

## 2023-03-15 DIAGNOSIS — E1159 Type 2 diabetes mellitus with other circulatory complications: Secondary | ICD-10-CM | POA: Diagnosis not present

## 2023-03-28 DIAGNOSIS — E785 Hyperlipidemia, unspecified: Secondary | ICD-10-CM | POA: Diagnosis not present

## 2023-03-28 DIAGNOSIS — Z794 Long term (current) use of insulin: Secondary | ICD-10-CM | POA: Diagnosis not present

## 2023-03-28 DIAGNOSIS — E119 Type 2 diabetes mellitus without complications: Secondary | ICD-10-CM | POA: Diagnosis not present

## 2023-03-28 DIAGNOSIS — E1165 Type 2 diabetes mellitus with hyperglycemia: Secondary | ICD-10-CM | POA: Diagnosis not present

## 2023-03-28 DIAGNOSIS — I10 Essential (primary) hypertension: Secondary | ICD-10-CM | POA: Diagnosis not present

## 2023-04-05 ENCOUNTER — Non-Acute Institutional Stay: Payer: Medicare Other | Admitting: Family Medicine

## 2023-04-05 ENCOUNTER — Encounter: Payer: Self-pay | Admitting: Family Medicine

## 2023-04-05 VITALS — BP 142/76 | HR 76 | Temp 96.6°F | Resp 18

## 2023-04-05 DIAGNOSIS — G309 Alzheimer's disease, unspecified: Secondary | ICD-10-CM

## 2023-04-05 DIAGNOSIS — Z794 Long term (current) use of insulin: Secondary | ICD-10-CM

## 2023-04-05 DIAGNOSIS — F02818 Dementia in other diseases classified elsewhere, unspecified severity, with other behavioral disturbance: Secondary | ICD-10-CM | POA: Diagnosis not present

## 2023-04-05 DIAGNOSIS — E1165 Type 2 diabetes mellitus with hyperglycemia: Secondary | ICD-10-CM | POA: Diagnosis not present

## 2023-04-05 NOTE — Progress Notes (Signed)
Therapist, nutritional Palliative Care Consult Note Telephone: [phone]  Fax: [phone]   Date of encounter: 04/05/23 9:28 AM PATIENT NAME: Mary Swanson [address] [place] [address]   [phone] (home)  DOB: 1956-04-07 MRN: [id number] PRIMARY CARE PROVIDER:    No primary care provider on file.,  No primary provider on file. None  REFERRING PROVIDER:   Christene Lye, facility NP  Emergency Contact:    Contact Information     Name Relation Home Work Mobile   Cobb,Phillip Friend   [phone]   Cobb,Phyllis Neighbor   [phone]       Legal guardian [name]   I met face to face with patient in [facility] facility. Palliative Care was asked to follow this patient by consultation request of Christene Lye, NP to address advance care planning and complex medical decision making. This is a follow up visit.  Review of an existing advance directive document.  CODE STATUS: MOST as of 01/06/22: DNR/DNI with limited additional intervention Use of antibiotics and IV fluids on a case by case, time limited basis No feeding tube.    ASSESSMENT AND / RECOMMENDATIONS:  PPS: 50%  Alzheimer' dementia with behavioral disturbance FAST 7 score 7b Not currently on any meds for dementia or for mood disturbance May consider Quetiapine 25 mg QHS if not sleeping well and needs mood stabilizer for sundowning. Try to mirror behavior desired for pt to mimic.  2.   Type 2 DM with long term current use of insulin Continue Lantus 15 units Q am and 25 units QHS with Metformin 1000 mg daily with breakfast.' No hypoglycemia.    Follow up Palliative Care Visit:  Palliative Care continuing to follow up by monitoring for changes in appetite, weight, functional and cognitive status for chronic disease progression and management in agreement with patient's stated goals of care. Next visit in 6-8 weeks or prn.  This visit was coded based on medical  decision making (MDM).  Chief Complaint  Palliative Care is continuing to follow pt for chronic medical management in setting of dementia.  HISTORY OF PRESENT ILLNESS: Mary Swanson is a 67 y.o. year old female with dementia. Pt is seen sitting on the couch listening to music and participating in group exercise.  She was still long enough to obtain VS.  She offers no response or resistance to questions/exam. She does say "You're welcome" when this provider says thank you. Staff states pt's blood sugar in the 100s, todays were 90s and 190 before lunch.   ACTIVITIES OF DAILY LIVING: CONTINENT OF BLADDER/BOWEL? No, total care for toileting BATHING/DRESSING/FEEDING: total care for bathing/dressing, independent with feeding  MOBILITY:   INDEPENDENTLY AMBULATORY  APPETITE? good WEIGHT: No recent weight  CURRENT PROBLEM LIST:  Patient Active Problem List   Diagnosis Date Noted   Palliative care encounter 01/09/2023   Type 2 diabetes mellitus with hyperglycemia, with long-term current use of insulin (HCC) 06/23/2022   Uncontrolled type 2 diabetes mellitus with hyperglycemia, with long-term current use of insulin with hyperlipidemia 01/07/2022   Alzheimer's dementia with behavioral disturbance (HCC)    Abnormal CXR    Essential hypertension    Hyperglycemia 12/03/2019   Heart murmur, systolic 05/29/2017   Hyperlipidemia 01/24/2007   Obesity (BMI 30-39.9) 01/24/2007   Anemia 01/24/2007   History of depression 01/24/2007   RHINITIS, ALLERGIC 01/24/2007   PAST MEDICAL HISTORY:  Active Ambulatory Problems    Diagnosis Date Noted   Hyperlipidemia 01/24/2007   Obesity (  BMI 30-39.9) 01/24/2007   Anemia 01/24/2007   History of depression 01/24/2007   RHINITIS, ALLERGIC 01/24/2007   Heart murmur, systolic 05/29/2017   Hyperglycemia 12/03/2019   Essential hypertension    Abnormal CXR    Alzheimer's dementia with behavioral disturbance (HCC)    Uncontrolled type 2 diabetes mellitus with  hyperglycemia, with long-term current use of insulin with hyperlipidemia 01/07/2022   Type 2 diabetes mellitus with hyperglycemia, with long-term current use of insulin (HCC) 06/23/2022   Palliative care encounter 01/09/2023   Resolved Ambulatory Problems    Diagnosis Date Noted   DIABETES MELLITUS II, UNCOMPLICATED 01/24/2007   ACE-inhibitor cough 03/14/2019   Healthcare maintenance 10/02/2019   Elevated blood pressure reading 12/03/2019   Memory loss of unknown cause 12/03/2019   AKI (acute kidney injury) (HCC) 12/27/2019   Dehydration 12/27/2019   Acute metabolic encephalopathy 12/28/2019   Diabetes mellitus (HCC) 12/28/2019   Hospital discharge follow-up 01/02/2020   Altered mental status 06/15/2021   Hypernatremia 06/16/2021   Acute lower UTI 06/16/2021   Diabetic foot infection (HCC) 01/06/2022   Sepsis due to Streptococcus pyogenes due to right foot cellulitis 01/06/2022   Transaminitis 01/06/2022   Past Medical History:  Diagnosis Date   Diabetes mellitus without complication (HCC)    Hypertension    SOCIAL HX:  Social History   Tobacco Use   Smoking status: Never   Smokeless tobacco: Never  Substance Use Topics   Alcohol use: Not Currently   FAMILY HX:  Family History  Problem Relation Age of Onset   Diabetes Neg Hx        Preferred Pharmacy: ALLERGIES:  Allergies  Allergen Reactions   Erythromycin Diarrhea   Latex Hives   Lisinopril Cough     PERTINENT MEDICATIONS:  Outpatient Encounter Medications as of 04/05/2023  Medication Sig   acetaminophen (TYLENOL) 325 MG tablet Take 650 mg by mouth every 8 (eight) hours as needed for moderate pain, fever or headache.   amLODipine (NORVASC) 10 MG tablet Take 1 tablet (10 mg total) by mouth daily.   aspirin 81 MG chewable tablet Chew 81 mg by mouth daily.    feeding supplement, GLUCERNA SHAKE, (GLUCERNA SHAKE) LIQD Take 237 mLs by mouth 2 (two) times daily between meals.   ferrous sulfate 325 (65 FE) MG tablet  Take 325 mg by mouth daily with breakfast.   insulin glargine (LANTUS SOLOSTAR) 100 UNIT/ML Solostar Pen Inject 18 Units into the skin daily. (Patient taking differently: Inject 15-25 Units into the skin as directed. Inject 15 units [place] daily in am, Inject 25 units [place] at HS)   Insulin Pen Needle (BD PEN NEEDLE NANO U/F) 32G X 4 MM MISC 1 Stick by Does not apply route every morning.   LORazepam (ATIVAN) 0.5 MG tablet Take 1 tablet (0.5 mg total) by mouth in the morning and at bedtime. (Patient taking differently: Take 0.5 mg by mouth 3 (three) times daily.)   metFORMIN (GLUCOPHAGE-XR) 500 MG 24 hr tablet Take 1,000 mg by mouth daily with breakfast.   Multiple Vitamin (MULTIVITAMIN WITH MINERALS) TABS tablet Take 1 tablet by mouth daily.   rosuvastatin (CRESTOR) 20 MG tablet Take 20 mg by mouth at bedtime.   thiamine 100 MG tablet Take 1 tablet (100 mg total) by mouth daily.   AgaMatrix Ultra-Thin Lancets MISC Check blood glucose 3 times daily before meals and as needed   Blood Glucose Monitoring Suppl (AGAMATRIX PRESTO) w/Device KIT Check blood glucose three times daily before meals and  as needed   glucose blood (AGAMATRIX PRESTO TEST) test strip Check blood glucose 3 times daily before meals and as needed   [DISCONTINUED] insulin lispro (HUMALOG) 100 UNIT/ML KwikPen Inject 0-14 Units into the skin 3 (three) times daily. 0-250 0 units 251-300 4 units 301-350 6 units 351-400 10 units 401-450 12 units 451+ 14 units & notify MD (Patient not taking: Reported on 01/09/2023)   No facility-administered encounter medications on file as of 04/05/2023.    History obtained from review of EMR, discussion with facility staff/caregiver and/or patient.        I reviewed available labs, medications, imaging, studies and related documents from the EMR.  There were no new records/imaging since last visit.  Physical Exam: GENERAL: NAD LUNGS: CTAB, no increased work of breathing, room air CARDIAC:  S1S2, RRR  with no MRG, No edema/cyanosis ABD:  Normo-active BS x 4 quads, soft, non-tender EXTREMITIES: Normal ROM, no deformity, strength equal, No muscle atrophy/subcutaneous fat loss NEURO:  No weakness, severe cognitive impairment with likely expressive & receptive aphasia  PSYCH:  non-anxious affect, A & O to self  Thank you for the opportunity to participate in the care of Mary Swanson. Please call our main office at [phone] if we can be of additional assistance.    Joycelyn Man FNP-C  [email or web address] [email or web address] .[facility]  Phone:  [phone]

## 2023-04-12 DIAGNOSIS — E1165 Type 2 diabetes mellitus with hyperglycemia: Secondary | ICD-10-CM | POA: Diagnosis not present

## 2023-04-18 DIAGNOSIS — E1165 Type 2 diabetes mellitus with hyperglycemia: Secondary | ICD-10-CM | POA: Diagnosis not present

## 2023-04-30 DIAGNOSIS — Z794 Long term (current) use of insulin: Secondary | ICD-10-CM | POA: Diagnosis not present

## 2023-04-30 DIAGNOSIS — E119 Type 2 diabetes mellitus without complications: Secondary | ICD-10-CM | POA: Diagnosis not present

## 2023-05-16 DIAGNOSIS — I10 Essential (primary) hypertension: Secondary | ICD-10-CM | POA: Diagnosis not present

## 2023-05-16 DIAGNOSIS — B950 Streptococcus, group A, as the cause of diseases classified elsewhere: Secondary | ICD-10-CM | POA: Diagnosis not present

## 2023-05-16 DIAGNOSIS — Z112 Encounter for screening for other bacterial diseases: Secondary | ICD-10-CM | POA: Diagnosis not present

## 2023-06-05 DIAGNOSIS — B351 Tinea unguium: Secondary | ICD-10-CM | POA: Diagnosis not present

## 2023-06-05 DIAGNOSIS — E1159 Type 2 diabetes mellitus with other circulatory complications: Secondary | ICD-10-CM | POA: Diagnosis not present

## 2023-06-27 DIAGNOSIS — E1165 Type 2 diabetes mellitus with hyperglycemia: Secondary | ICD-10-CM | POA: Diagnosis not present

## 2023-07-04 DIAGNOSIS — I10 Essential (primary) hypertension: Secondary | ICD-10-CM | POA: Diagnosis not present

## 2023-07-11 DIAGNOSIS — E1165 Type 2 diabetes mellitus with hyperglycemia: Secondary | ICD-10-CM | POA: Diagnosis not present

## 2023-07-11 DIAGNOSIS — I10 Essential (primary) hypertension: Secondary | ICD-10-CM | POA: Diagnosis not present

## 2023-07-11 DIAGNOSIS — E785 Hyperlipidemia, unspecified: Secondary | ICD-10-CM | POA: Diagnosis not present

## 2023-07-11 DIAGNOSIS — E039 Hypothyroidism, unspecified: Secondary | ICD-10-CM | POA: Diagnosis not present

## 2023-07-11 DIAGNOSIS — E559 Vitamin D deficiency, unspecified: Secondary | ICD-10-CM | POA: Diagnosis not present

## 2023-07-18 DIAGNOSIS — I10 Essential (primary) hypertension: Secondary | ICD-10-CM | POA: Diagnosis not present

## 2023-07-18 DIAGNOSIS — E785 Hyperlipidemia, unspecified: Secondary | ICD-10-CM | POA: Diagnosis not present

## 2023-07-18 DIAGNOSIS — E1165 Type 2 diabetes mellitus with hyperglycemia: Secondary | ICD-10-CM | POA: Diagnosis not present

## 2023-07-26 DIAGNOSIS — F03C18 Unspecified dementia, severe, with other behavioral disturbance: Secondary | ICD-10-CM | POA: Diagnosis not present

## 2023-08-25 DIAGNOSIS — E1159 Type 2 diabetes mellitus with other circulatory complications: Secondary | ICD-10-CM | POA: Diagnosis not present

## 2023-08-25 DIAGNOSIS — B351 Tinea unguium: Secondary | ICD-10-CM | POA: Diagnosis not present

## 2023-08-29 DIAGNOSIS — I10 Essential (primary) hypertension: Secondary | ICD-10-CM | POA: Diagnosis not present

## 2023-08-29 DIAGNOSIS — E1165 Type 2 diabetes mellitus with hyperglycemia: Secondary | ICD-10-CM | POA: Diagnosis not present

## 2023-09-05 DIAGNOSIS — H00013 Hordeolum externum right eye, unspecified eyelid: Secondary | ICD-10-CM | POA: Diagnosis not present

## 2023-09-12 DIAGNOSIS — S50812S Abrasion of left forearm, sequela: Secondary | ICD-10-CM | POA: Diagnosis not present

## 2023-09-12 DIAGNOSIS — S40021S Contusion of right upper arm, sequela: Secondary | ICD-10-CM | POA: Diagnosis not present

## 2023-09-12 DIAGNOSIS — M79621 Pain in right upper arm: Secondary | ICD-10-CM | POA: Diagnosis not present

## 2023-09-12 DIAGNOSIS — I10 Essential (primary) hypertension: Secondary | ICD-10-CM | POA: Diagnosis not present

## 2023-09-19 DIAGNOSIS — L84 Corns and callosities: Secondary | ICD-10-CM | POA: Diagnosis not present

## 2023-09-19 DIAGNOSIS — E1165 Type 2 diabetes mellitus with hyperglycemia: Secondary | ICD-10-CM | POA: Diagnosis not present

## 2023-10-02 DIAGNOSIS — L853 Xerosis cutis: Secondary | ICD-10-CM | POA: Diagnosis not present

## 2023-10-02 DIAGNOSIS — L821 Other seborrheic keratosis: Secondary | ICD-10-CM | POA: Diagnosis not present

## 2023-10-03 DIAGNOSIS — L84 Corns and callosities: Secondary | ICD-10-CM | POA: Diagnosis not present

## 2023-10-12 DIAGNOSIS — L97509 Non-pressure chronic ulcer of other part of unspecified foot with unspecified severity: Secondary | ICD-10-CM | POA: Diagnosis not present

## 2023-10-12 DIAGNOSIS — L84 Corns and callosities: Secondary | ICD-10-CM | POA: Diagnosis not present

## 2023-10-12 DIAGNOSIS — E1165 Type 2 diabetes mellitus with hyperglycemia: Secondary | ICD-10-CM | POA: Diagnosis not present

## 2023-10-15 DIAGNOSIS — Z794 Long term (current) use of insulin: Secondary | ICD-10-CM | POA: Diagnosis not present

## 2023-10-15 DIAGNOSIS — E785 Hyperlipidemia, unspecified: Secondary | ICD-10-CM | POA: Diagnosis not present

## 2023-10-15 DIAGNOSIS — Z556 Problems related to health literacy: Secondary | ICD-10-CM | POA: Diagnosis not present

## 2023-10-15 DIAGNOSIS — Z7982 Long term (current) use of aspirin: Secondary | ICD-10-CM | POA: Diagnosis not present

## 2023-10-15 DIAGNOSIS — E039 Hypothyroidism, unspecified: Secondary | ICD-10-CM | POA: Diagnosis not present

## 2023-10-15 DIAGNOSIS — Z7984 Long term (current) use of oral hypoglycemic drugs: Secondary | ICD-10-CM | POA: Diagnosis not present

## 2023-10-15 DIAGNOSIS — Z8616 Personal history of COVID-19: Secondary | ICD-10-CM | POA: Diagnosis not present

## 2023-10-15 DIAGNOSIS — Z87440 Personal history of urinary (tract) infections: Secondary | ICD-10-CM | POA: Diagnosis not present

## 2023-10-15 DIAGNOSIS — E1165 Type 2 diabetes mellitus with hyperglycemia: Secondary | ICD-10-CM | POA: Diagnosis not present

## 2023-10-15 DIAGNOSIS — Z6824 Body mass index (BMI) 24.0-24.9, adult: Secondary | ICD-10-CM | POA: Diagnosis not present

## 2023-10-15 DIAGNOSIS — E559 Vitamin D deficiency, unspecified: Secondary | ICD-10-CM | POA: Diagnosis not present

## 2023-10-15 DIAGNOSIS — I10 Essential (primary) hypertension: Secondary | ICD-10-CM | POA: Diagnosis not present

## 2023-10-15 DIAGNOSIS — L84 Corns and callosities: Secondary | ICD-10-CM | POA: Diagnosis not present

## 2023-10-17 DIAGNOSIS — L84 Corns and callosities: Secondary | ICD-10-CM | POA: Diagnosis not present

## 2023-10-19 DIAGNOSIS — Z7984 Long term (current) use of oral hypoglycemic drugs: Secondary | ICD-10-CM | POA: Diagnosis not present

## 2023-10-19 DIAGNOSIS — Z794 Long term (current) use of insulin: Secondary | ICD-10-CM | POA: Diagnosis not present

## 2023-10-19 DIAGNOSIS — Z87440 Personal history of urinary (tract) infections: Secondary | ICD-10-CM | POA: Diagnosis not present

## 2023-10-19 DIAGNOSIS — E785 Hyperlipidemia, unspecified: Secondary | ICD-10-CM | POA: Diagnosis not present

## 2023-10-19 DIAGNOSIS — L84 Corns and callosities: Secondary | ICD-10-CM | POA: Diagnosis not present

## 2023-10-19 DIAGNOSIS — Z6824 Body mass index (BMI) 24.0-24.9, adult: Secondary | ICD-10-CM | POA: Diagnosis not present

## 2023-10-19 DIAGNOSIS — Z8616 Personal history of COVID-19: Secondary | ICD-10-CM | POA: Diagnosis not present

## 2023-10-19 DIAGNOSIS — E039 Hypothyroidism, unspecified: Secondary | ICD-10-CM | POA: Diagnosis not present

## 2023-10-19 DIAGNOSIS — E559 Vitamin D deficiency, unspecified: Secondary | ICD-10-CM | POA: Diagnosis not present

## 2023-10-19 DIAGNOSIS — E1165 Type 2 diabetes mellitus with hyperglycemia: Secondary | ICD-10-CM | POA: Diagnosis not present

## 2023-10-19 DIAGNOSIS — Z7982 Long term (current) use of aspirin: Secondary | ICD-10-CM | POA: Diagnosis not present

## 2023-10-19 DIAGNOSIS — I10 Essential (primary) hypertension: Secondary | ICD-10-CM | POA: Diagnosis not present

## 2023-10-19 DIAGNOSIS — Z556 Problems related to health literacy: Secondary | ICD-10-CM | POA: Diagnosis not present

## 2023-10-22 DIAGNOSIS — E785 Hyperlipidemia, unspecified: Secondary | ICD-10-CM | POA: Diagnosis not present

## 2023-10-22 DIAGNOSIS — Z7984 Long term (current) use of oral hypoglycemic drugs: Secondary | ICD-10-CM | POA: Diagnosis not present

## 2023-10-22 DIAGNOSIS — Z87440 Personal history of urinary (tract) infections: Secondary | ICD-10-CM | POA: Diagnosis not present

## 2023-10-22 DIAGNOSIS — Z556 Problems related to health literacy: Secondary | ICD-10-CM | POA: Diagnosis not present

## 2023-10-22 DIAGNOSIS — L84 Corns and callosities: Secondary | ICD-10-CM | POA: Diagnosis not present

## 2023-10-22 DIAGNOSIS — Z794 Long term (current) use of insulin: Secondary | ICD-10-CM | POA: Diagnosis not present

## 2023-10-22 DIAGNOSIS — Z7982 Long term (current) use of aspirin: Secondary | ICD-10-CM | POA: Diagnosis not present

## 2023-10-22 DIAGNOSIS — E1165 Type 2 diabetes mellitus with hyperglycemia: Secondary | ICD-10-CM | POA: Diagnosis not present

## 2023-10-22 DIAGNOSIS — Z8616 Personal history of COVID-19: Secondary | ICD-10-CM | POA: Diagnosis not present

## 2023-10-22 DIAGNOSIS — E559 Vitamin D deficiency, unspecified: Secondary | ICD-10-CM | POA: Diagnosis not present

## 2023-10-22 DIAGNOSIS — Z6824 Body mass index (BMI) 24.0-24.9, adult: Secondary | ICD-10-CM | POA: Diagnosis not present

## 2023-10-22 DIAGNOSIS — I10 Essential (primary) hypertension: Secondary | ICD-10-CM | POA: Diagnosis not present

## 2023-10-22 DIAGNOSIS — E039 Hypothyroidism, unspecified: Secondary | ICD-10-CM | POA: Diagnosis not present

## 2023-10-24 DIAGNOSIS — Z8616 Personal history of COVID-19: Secondary | ICD-10-CM | POA: Diagnosis not present

## 2023-10-24 DIAGNOSIS — Z556 Problems related to health literacy: Secondary | ICD-10-CM | POA: Diagnosis not present

## 2023-10-24 DIAGNOSIS — Z6824 Body mass index (BMI) 24.0-24.9, adult: Secondary | ICD-10-CM | POA: Diagnosis not present

## 2023-10-24 DIAGNOSIS — Z7984 Long term (current) use of oral hypoglycemic drugs: Secondary | ICD-10-CM | POA: Diagnosis not present

## 2023-10-24 DIAGNOSIS — I10 Essential (primary) hypertension: Secondary | ICD-10-CM | POA: Diagnosis not present

## 2023-10-24 DIAGNOSIS — L84 Corns and callosities: Secondary | ICD-10-CM | POA: Diagnosis not present

## 2023-10-24 DIAGNOSIS — E039 Hypothyroidism, unspecified: Secondary | ICD-10-CM | POA: Diagnosis not present

## 2023-10-24 DIAGNOSIS — E785 Hyperlipidemia, unspecified: Secondary | ICD-10-CM | POA: Diagnosis not present

## 2023-10-24 DIAGNOSIS — Z87440 Personal history of urinary (tract) infections: Secondary | ICD-10-CM | POA: Diagnosis not present

## 2023-10-24 DIAGNOSIS — E559 Vitamin D deficiency, unspecified: Secondary | ICD-10-CM | POA: Diagnosis not present

## 2023-10-24 DIAGNOSIS — E1165 Type 2 diabetes mellitus with hyperglycemia: Secondary | ICD-10-CM | POA: Diagnosis not present

## 2023-10-24 DIAGNOSIS — Z794 Long term (current) use of insulin: Secondary | ICD-10-CM | POA: Diagnosis not present

## 2023-10-24 DIAGNOSIS — Z7982 Long term (current) use of aspirin: Secondary | ICD-10-CM | POA: Diagnosis not present

## 2023-10-26 DIAGNOSIS — E1165 Type 2 diabetes mellitus with hyperglycemia: Secondary | ICD-10-CM | POA: Diagnosis not present

## 2023-10-29 DIAGNOSIS — E039 Hypothyroidism, unspecified: Secondary | ICD-10-CM | POA: Diagnosis not present

## 2023-10-29 DIAGNOSIS — E785 Hyperlipidemia, unspecified: Secondary | ICD-10-CM | POA: Diagnosis not present

## 2023-10-29 DIAGNOSIS — I10 Essential (primary) hypertension: Secondary | ICD-10-CM | POA: Diagnosis not present

## 2023-10-29 DIAGNOSIS — Z556 Problems related to health literacy: Secondary | ICD-10-CM | POA: Diagnosis not present

## 2023-10-29 DIAGNOSIS — Z7982 Long term (current) use of aspirin: Secondary | ICD-10-CM | POA: Diagnosis not present

## 2023-10-29 DIAGNOSIS — E559 Vitamin D deficiency, unspecified: Secondary | ICD-10-CM | POA: Diagnosis not present

## 2023-10-29 DIAGNOSIS — Z794 Long term (current) use of insulin: Secondary | ICD-10-CM | POA: Diagnosis not present

## 2023-10-29 DIAGNOSIS — Z87440 Personal history of urinary (tract) infections: Secondary | ICD-10-CM | POA: Diagnosis not present

## 2023-10-29 DIAGNOSIS — L84 Corns and callosities: Secondary | ICD-10-CM | POA: Diagnosis not present

## 2023-10-29 DIAGNOSIS — Z8616 Personal history of COVID-19: Secondary | ICD-10-CM | POA: Diagnosis not present

## 2023-10-29 DIAGNOSIS — E1165 Type 2 diabetes mellitus with hyperglycemia: Secondary | ICD-10-CM | POA: Diagnosis not present

## 2023-10-29 DIAGNOSIS — Z6824 Body mass index (BMI) 24.0-24.9, adult: Secondary | ICD-10-CM | POA: Diagnosis not present

## 2023-10-29 DIAGNOSIS — Z7984 Long term (current) use of oral hypoglycemic drugs: Secondary | ICD-10-CM | POA: Diagnosis not present

## 2023-10-31 DIAGNOSIS — E1165 Type 2 diabetes mellitus with hyperglycemia: Secondary | ICD-10-CM | POA: Diagnosis not present

## 2023-10-31 DIAGNOSIS — L97509 Non-pressure chronic ulcer of other part of unspecified foot with unspecified severity: Secondary | ICD-10-CM | POA: Diagnosis not present

## 2023-11-02 DIAGNOSIS — E559 Vitamin D deficiency, unspecified: Secondary | ICD-10-CM | POA: Diagnosis not present

## 2023-11-02 DIAGNOSIS — Z7982 Long term (current) use of aspirin: Secondary | ICD-10-CM | POA: Diagnosis not present

## 2023-11-02 DIAGNOSIS — Z6824 Body mass index (BMI) 24.0-24.9, adult: Secondary | ICD-10-CM | POA: Diagnosis not present

## 2023-11-02 DIAGNOSIS — L84 Corns and callosities: Secondary | ICD-10-CM | POA: Diagnosis not present

## 2023-11-02 DIAGNOSIS — Z8616 Personal history of COVID-19: Secondary | ICD-10-CM | POA: Diagnosis not present

## 2023-11-02 DIAGNOSIS — Z794 Long term (current) use of insulin: Secondary | ICD-10-CM | POA: Diagnosis not present

## 2023-11-02 DIAGNOSIS — Z7984 Long term (current) use of oral hypoglycemic drugs: Secondary | ICD-10-CM | POA: Diagnosis not present

## 2023-11-02 DIAGNOSIS — Z87440 Personal history of urinary (tract) infections: Secondary | ICD-10-CM | POA: Diagnosis not present

## 2023-11-02 DIAGNOSIS — E785 Hyperlipidemia, unspecified: Secondary | ICD-10-CM | POA: Diagnosis not present

## 2023-11-02 DIAGNOSIS — Z556 Problems related to health literacy: Secondary | ICD-10-CM | POA: Diagnosis not present

## 2023-11-02 DIAGNOSIS — I10 Essential (primary) hypertension: Secondary | ICD-10-CM | POA: Diagnosis not present

## 2023-11-02 DIAGNOSIS — E1165 Type 2 diabetes mellitus with hyperglycemia: Secondary | ICD-10-CM | POA: Diagnosis not present

## 2023-11-02 DIAGNOSIS — E039 Hypothyroidism, unspecified: Secondary | ICD-10-CM | POA: Diagnosis not present

## 2023-11-05 DIAGNOSIS — I10 Essential (primary) hypertension: Secondary | ICD-10-CM | POA: Diagnosis not present

## 2023-11-05 DIAGNOSIS — Z794 Long term (current) use of insulin: Secondary | ICD-10-CM | POA: Diagnosis not present

## 2023-11-05 DIAGNOSIS — E559 Vitamin D deficiency, unspecified: Secondary | ICD-10-CM | POA: Diagnosis not present

## 2023-11-05 DIAGNOSIS — Z7982 Long term (current) use of aspirin: Secondary | ICD-10-CM | POA: Diagnosis not present

## 2023-11-05 DIAGNOSIS — E785 Hyperlipidemia, unspecified: Secondary | ICD-10-CM | POA: Diagnosis not present

## 2023-11-05 DIAGNOSIS — Z8616 Personal history of COVID-19: Secondary | ICD-10-CM | POA: Diagnosis not present

## 2023-11-05 DIAGNOSIS — E1165 Type 2 diabetes mellitus with hyperglycemia: Secondary | ICD-10-CM | POA: Diagnosis not present

## 2023-11-05 DIAGNOSIS — Z7984 Long term (current) use of oral hypoglycemic drugs: Secondary | ICD-10-CM | POA: Diagnosis not present

## 2023-11-05 DIAGNOSIS — Z556 Problems related to health literacy: Secondary | ICD-10-CM | POA: Diagnosis not present

## 2023-11-05 DIAGNOSIS — Z87440 Personal history of urinary (tract) infections: Secondary | ICD-10-CM | POA: Diagnosis not present

## 2023-11-05 DIAGNOSIS — E039 Hypothyroidism, unspecified: Secondary | ICD-10-CM | POA: Diagnosis not present

## 2023-11-05 DIAGNOSIS — L84 Corns and callosities: Secondary | ICD-10-CM | POA: Diagnosis not present

## 2023-11-05 DIAGNOSIS — Z6824 Body mass index (BMI) 24.0-24.9, adult: Secondary | ICD-10-CM | POA: Diagnosis not present

## 2023-11-07 DIAGNOSIS — M6281 Muscle weakness (generalized): Secondary | ICD-10-CM | POA: Diagnosis not present

## 2023-11-07 DIAGNOSIS — Z0000 Encounter for general adult medical examination without abnormal findings: Secondary | ICD-10-CM | POA: Diagnosis not present

## 2023-11-07 DIAGNOSIS — R2681 Unsteadiness on feet: Secondary | ICD-10-CM | POA: Diagnosis not present

## 2023-11-08 DIAGNOSIS — Z7982 Long term (current) use of aspirin: Secondary | ICD-10-CM | POA: Diagnosis not present

## 2023-11-08 DIAGNOSIS — E559 Vitamin D deficiency, unspecified: Secondary | ICD-10-CM | POA: Diagnosis not present

## 2023-11-08 DIAGNOSIS — E1165 Type 2 diabetes mellitus with hyperglycemia: Secondary | ICD-10-CM | POA: Diagnosis not present

## 2023-11-08 DIAGNOSIS — I10 Essential (primary) hypertension: Secondary | ICD-10-CM | POA: Diagnosis not present

## 2023-11-08 DIAGNOSIS — Z87440 Personal history of urinary (tract) infections: Secondary | ICD-10-CM | POA: Diagnosis not present

## 2023-11-08 DIAGNOSIS — Z7984 Long term (current) use of oral hypoglycemic drugs: Secondary | ICD-10-CM | POA: Diagnosis not present

## 2023-11-08 DIAGNOSIS — Z6824 Body mass index (BMI) 24.0-24.9, adult: Secondary | ICD-10-CM | POA: Diagnosis not present

## 2023-11-08 DIAGNOSIS — Z8616 Personal history of COVID-19: Secondary | ICD-10-CM | POA: Diagnosis not present

## 2023-11-08 DIAGNOSIS — Z556 Problems related to health literacy: Secondary | ICD-10-CM | POA: Diagnosis not present

## 2023-11-08 DIAGNOSIS — E039 Hypothyroidism, unspecified: Secondary | ICD-10-CM | POA: Diagnosis not present

## 2023-11-08 DIAGNOSIS — L84 Corns and callosities: Secondary | ICD-10-CM | POA: Diagnosis not present

## 2023-11-08 DIAGNOSIS — E785 Hyperlipidemia, unspecified: Secondary | ICD-10-CM | POA: Diagnosis not present

## 2023-11-08 DIAGNOSIS — Z794 Long term (current) use of insulin: Secondary | ICD-10-CM | POA: Diagnosis not present

## 2023-11-12 DIAGNOSIS — Z794 Long term (current) use of insulin: Secondary | ICD-10-CM | POA: Diagnosis not present

## 2023-11-12 DIAGNOSIS — I10 Essential (primary) hypertension: Secondary | ICD-10-CM | POA: Diagnosis not present

## 2023-11-12 DIAGNOSIS — E1165 Type 2 diabetes mellitus with hyperglycemia: Secondary | ICD-10-CM | POA: Diagnosis not present

## 2023-11-12 DIAGNOSIS — Z556 Problems related to health literacy: Secondary | ICD-10-CM | POA: Diagnosis not present

## 2023-11-12 DIAGNOSIS — E785 Hyperlipidemia, unspecified: Secondary | ICD-10-CM | POA: Diagnosis not present

## 2023-11-12 DIAGNOSIS — R531 Weakness: Secondary | ICD-10-CM | POA: Diagnosis not present

## 2023-11-12 DIAGNOSIS — L84 Corns and callosities: Secondary | ICD-10-CM | POA: Diagnosis not present

## 2023-11-12 DIAGNOSIS — Z7984 Long term (current) use of oral hypoglycemic drugs: Secondary | ICD-10-CM | POA: Diagnosis not present

## 2023-11-12 DIAGNOSIS — Z87440 Personal history of urinary (tract) infections: Secondary | ICD-10-CM | POA: Diagnosis not present

## 2023-11-12 DIAGNOSIS — E559 Vitamin D deficiency, unspecified: Secondary | ICD-10-CM | POA: Diagnosis not present

## 2023-11-12 DIAGNOSIS — Z8616 Personal history of COVID-19: Secondary | ICD-10-CM | POA: Diagnosis not present

## 2023-11-12 DIAGNOSIS — E039 Hypothyroidism, unspecified: Secondary | ICD-10-CM | POA: Diagnosis not present

## 2023-11-12 DIAGNOSIS — Z6824 Body mass index (BMI) 24.0-24.9, adult: Secondary | ICD-10-CM | POA: Diagnosis not present

## 2023-11-12 DIAGNOSIS — Z7982 Long term (current) use of aspirin: Secondary | ICD-10-CM | POA: Diagnosis not present

## 2023-11-14 DIAGNOSIS — M6281 Muscle weakness (generalized): Secondary | ICD-10-CM | POA: Diagnosis not present

## 2023-11-14 DIAGNOSIS — E1165 Type 2 diabetes mellitus with hyperglycemia: Secondary | ICD-10-CM | POA: Diagnosis not present

## 2023-11-15 DIAGNOSIS — Z794 Long term (current) use of insulin: Secondary | ICD-10-CM | POA: Diagnosis not present

## 2023-11-15 DIAGNOSIS — Z6824 Body mass index (BMI) 24.0-24.9, adult: Secondary | ICD-10-CM | POA: Diagnosis not present

## 2023-11-15 DIAGNOSIS — Z8616 Personal history of COVID-19: Secondary | ICD-10-CM | POA: Diagnosis not present

## 2023-11-15 DIAGNOSIS — Z7982 Long term (current) use of aspirin: Secondary | ICD-10-CM | POA: Diagnosis not present

## 2023-11-15 DIAGNOSIS — L84 Corns and callosities: Secondary | ICD-10-CM | POA: Diagnosis not present

## 2023-11-15 DIAGNOSIS — Z87440 Personal history of urinary (tract) infections: Secondary | ICD-10-CM | POA: Diagnosis not present

## 2023-11-15 DIAGNOSIS — E039 Hypothyroidism, unspecified: Secondary | ICD-10-CM | POA: Diagnosis not present

## 2023-11-15 DIAGNOSIS — E1165 Type 2 diabetes mellitus with hyperglycemia: Secondary | ICD-10-CM | POA: Diagnosis not present

## 2023-11-15 DIAGNOSIS — I10 Essential (primary) hypertension: Secondary | ICD-10-CM | POA: Diagnosis not present

## 2023-11-15 DIAGNOSIS — Z556 Problems related to health literacy: Secondary | ICD-10-CM | POA: Diagnosis not present

## 2023-11-15 DIAGNOSIS — E785 Hyperlipidemia, unspecified: Secondary | ICD-10-CM | POA: Diagnosis not present

## 2023-11-15 DIAGNOSIS — E559 Vitamin D deficiency, unspecified: Secondary | ICD-10-CM | POA: Diagnosis not present

## 2023-11-15 DIAGNOSIS — Z7984 Long term (current) use of oral hypoglycemic drugs: Secondary | ICD-10-CM | POA: Diagnosis not present

## 2023-11-19 DIAGNOSIS — I10 Essential (primary) hypertension: Secondary | ICD-10-CM | POA: Diagnosis not present

## 2023-11-19 DIAGNOSIS — Z556 Problems related to health literacy: Secondary | ICD-10-CM | POA: Diagnosis not present

## 2023-11-19 DIAGNOSIS — Z87440 Personal history of urinary (tract) infections: Secondary | ICD-10-CM | POA: Diagnosis not present

## 2023-11-19 DIAGNOSIS — L84 Corns and callosities: Secondary | ICD-10-CM | POA: Diagnosis not present

## 2023-11-19 DIAGNOSIS — E1165 Type 2 diabetes mellitus with hyperglycemia: Secondary | ICD-10-CM | POA: Diagnosis not present

## 2023-11-19 DIAGNOSIS — E785 Hyperlipidemia, unspecified: Secondary | ICD-10-CM | POA: Diagnosis not present

## 2023-11-19 DIAGNOSIS — E039 Hypothyroidism, unspecified: Secondary | ICD-10-CM | POA: Diagnosis not present

## 2023-11-19 DIAGNOSIS — Z794 Long term (current) use of insulin: Secondary | ICD-10-CM | POA: Diagnosis not present

## 2023-11-19 DIAGNOSIS — Z7984 Long term (current) use of oral hypoglycemic drugs: Secondary | ICD-10-CM | POA: Diagnosis not present

## 2023-11-19 DIAGNOSIS — Z6824 Body mass index (BMI) 24.0-24.9, adult: Secondary | ICD-10-CM | POA: Diagnosis not present

## 2023-11-19 DIAGNOSIS — E559 Vitamin D deficiency, unspecified: Secondary | ICD-10-CM | POA: Diagnosis not present

## 2023-11-19 DIAGNOSIS — Z8616 Personal history of COVID-19: Secondary | ICD-10-CM | POA: Diagnosis not present

## 2023-11-19 DIAGNOSIS — Z7982 Long term (current) use of aspirin: Secondary | ICD-10-CM | POA: Diagnosis not present

## 2023-11-22 DIAGNOSIS — E559 Vitamin D deficiency, unspecified: Secondary | ICD-10-CM | POA: Diagnosis not present

## 2023-11-22 DIAGNOSIS — Z8616 Personal history of COVID-19: Secondary | ICD-10-CM | POA: Diagnosis not present

## 2023-11-22 DIAGNOSIS — E1165 Type 2 diabetes mellitus with hyperglycemia: Secondary | ICD-10-CM | POA: Diagnosis not present

## 2023-11-22 DIAGNOSIS — Z6824 Body mass index (BMI) 24.0-24.9, adult: Secondary | ICD-10-CM | POA: Diagnosis not present

## 2023-11-22 DIAGNOSIS — L84 Corns and callosities: Secondary | ICD-10-CM | POA: Diagnosis not present

## 2023-11-22 DIAGNOSIS — Z556 Problems related to health literacy: Secondary | ICD-10-CM | POA: Diagnosis not present

## 2023-11-22 DIAGNOSIS — Z87440 Personal history of urinary (tract) infections: Secondary | ICD-10-CM | POA: Diagnosis not present

## 2023-11-22 DIAGNOSIS — Z7982 Long term (current) use of aspirin: Secondary | ICD-10-CM | POA: Diagnosis not present

## 2023-11-22 DIAGNOSIS — E785 Hyperlipidemia, unspecified: Secondary | ICD-10-CM | POA: Diagnosis not present

## 2023-11-22 DIAGNOSIS — E039 Hypothyroidism, unspecified: Secondary | ICD-10-CM | POA: Diagnosis not present

## 2023-11-22 DIAGNOSIS — I10 Essential (primary) hypertension: Secondary | ICD-10-CM | POA: Diagnosis not present

## 2023-11-22 DIAGNOSIS — Z7984 Long term (current) use of oral hypoglycemic drugs: Secondary | ICD-10-CM | POA: Diagnosis not present

## 2023-11-22 DIAGNOSIS — Z794 Long term (current) use of insulin: Secondary | ICD-10-CM | POA: Diagnosis not present

## 2023-11-29 DIAGNOSIS — Z87440 Personal history of urinary (tract) infections: Secondary | ICD-10-CM | POA: Diagnosis not present

## 2023-11-29 DIAGNOSIS — E1165 Type 2 diabetes mellitus with hyperglycemia: Secondary | ICD-10-CM | POA: Diagnosis not present

## 2023-11-29 DIAGNOSIS — I10 Essential (primary) hypertension: Secondary | ICD-10-CM | POA: Diagnosis not present

## 2023-11-29 DIAGNOSIS — E559 Vitamin D deficiency, unspecified: Secondary | ICD-10-CM | POA: Diagnosis not present

## 2023-11-29 DIAGNOSIS — Z7984 Long term (current) use of oral hypoglycemic drugs: Secondary | ICD-10-CM | POA: Diagnosis not present

## 2023-11-29 DIAGNOSIS — Z794 Long term (current) use of insulin: Secondary | ICD-10-CM | POA: Diagnosis not present

## 2023-11-29 DIAGNOSIS — Z6824 Body mass index (BMI) 24.0-24.9, adult: Secondary | ICD-10-CM | POA: Diagnosis not present

## 2023-11-29 DIAGNOSIS — L84 Corns and callosities: Secondary | ICD-10-CM | POA: Diagnosis not present

## 2023-11-29 DIAGNOSIS — Z7982 Long term (current) use of aspirin: Secondary | ICD-10-CM | POA: Diagnosis not present

## 2023-11-29 DIAGNOSIS — Z8616 Personal history of COVID-19: Secondary | ICD-10-CM | POA: Diagnosis not present

## 2023-11-29 DIAGNOSIS — Z556 Problems related to health literacy: Secondary | ICD-10-CM | POA: Diagnosis not present

## 2023-11-29 DIAGNOSIS — E785 Hyperlipidemia, unspecified: Secondary | ICD-10-CM | POA: Diagnosis not present

## 2023-11-29 DIAGNOSIS — E039 Hypothyroidism, unspecified: Secondary | ICD-10-CM | POA: Diagnosis not present

## 2023-12-04 DIAGNOSIS — L84 Corns and callosities: Secondary | ICD-10-CM | POA: Diagnosis not present

## 2023-12-05 DIAGNOSIS — I10 Essential (primary) hypertension: Secondary | ICD-10-CM | POA: Diagnosis not present

## 2023-12-05 DIAGNOSIS — E1165 Type 2 diabetes mellitus with hyperglycemia: Secondary | ICD-10-CM | POA: Diagnosis not present

## 2023-12-06 DIAGNOSIS — E039 Hypothyroidism, unspecified: Secondary | ICD-10-CM | POA: Diagnosis not present

## 2023-12-06 DIAGNOSIS — E1165 Type 2 diabetes mellitus with hyperglycemia: Secondary | ICD-10-CM | POA: Diagnosis not present

## 2023-12-06 DIAGNOSIS — Z6824 Body mass index (BMI) 24.0-24.9, adult: Secondary | ICD-10-CM | POA: Diagnosis not present

## 2023-12-06 DIAGNOSIS — Z87440 Personal history of urinary (tract) infections: Secondary | ICD-10-CM | POA: Diagnosis not present

## 2023-12-06 DIAGNOSIS — Z556 Problems related to health literacy: Secondary | ICD-10-CM | POA: Diagnosis not present

## 2023-12-06 DIAGNOSIS — E559 Vitamin D deficiency, unspecified: Secondary | ICD-10-CM | POA: Diagnosis not present

## 2023-12-06 DIAGNOSIS — Z7982 Long term (current) use of aspirin: Secondary | ICD-10-CM | POA: Diagnosis not present

## 2023-12-06 DIAGNOSIS — E785 Hyperlipidemia, unspecified: Secondary | ICD-10-CM | POA: Diagnosis not present

## 2023-12-06 DIAGNOSIS — Z8616 Personal history of COVID-19: Secondary | ICD-10-CM | POA: Diagnosis not present

## 2023-12-06 DIAGNOSIS — I10 Essential (primary) hypertension: Secondary | ICD-10-CM | POA: Diagnosis not present

## 2023-12-06 DIAGNOSIS — Z7984 Long term (current) use of oral hypoglycemic drugs: Secondary | ICD-10-CM | POA: Diagnosis not present

## 2023-12-06 DIAGNOSIS — Z794 Long term (current) use of insulin: Secondary | ICD-10-CM | POA: Diagnosis not present

## 2023-12-06 DIAGNOSIS — L84 Corns and callosities: Secondary | ICD-10-CM | POA: Diagnosis not present

## 2023-12-11 ENCOUNTER — Emergency Department (HOSPITAL_COMMUNITY)
Admission: EM | Admit: 2023-12-11 | Discharge: 2023-12-12 | Disposition: A | Discharge status: Home / Self Care | Payer: Medicare Other | Attending: Emergency Medicine | Admitting: Emergency Medicine

## 2023-12-11 ENCOUNTER — Emergency Department (HOSPITAL_COMMUNITY): Payer: Medicare Other

## 2023-12-11 ENCOUNTER — Encounter (HOSPITAL_COMMUNITY): Payer: Self-pay | Admitting: *Deleted

## 2023-12-11 ENCOUNTER — Other Ambulatory Visit: Payer: Self-pay

## 2023-12-11 DIAGNOSIS — W19XXXA Unspecified fall, initial encounter: Secondary | ICD-10-CM | POA: Insufficient documentation

## 2023-12-11 DIAGNOSIS — Z7982 Long term (current) use of aspirin: Secondary | ICD-10-CM | POA: Insufficient documentation

## 2023-12-11 DIAGNOSIS — F0390 Unspecified dementia without behavioral disturbance: Secondary | ICD-10-CM | POA: Diagnosis not present

## 2023-12-11 DIAGNOSIS — E119 Type 2 diabetes mellitus without complications: Secondary | ICD-10-CM | POA: Diagnosis not present

## 2023-12-11 DIAGNOSIS — I10 Essential (primary) hypertension: Secondary | ICD-10-CM | POA: Diagnosis not present

## 2023-12-11 DIAGNOSIS — N390 Urinary tract infection, site not specified: Secondary | ICD-10-CM | POA: Insufficient documentation

## 2023-12-11 DIAGNOSIS — S0990XA Unspecified injury of head, initial encounter: Secondary | ICD-10-CM | POA: Diagnosis not present

## 2023-12-11 DIAGNOSIS — S0083XA Contusion of other part of head, initial encounter: Secondary | ICD-10-CM | POA: Diagnosis not present

## 2023-12-11 DIAGNOSIS — Z794 Long term (current) use of insulin: Secondary | ICD-10-CM | POA: Diagnosis not present

## 2023-12-11 DIAGNOSIS — Z79899 Other long term (current) drug therapy: Secondary | ICD-10-CM | POA: Insufficient documentation

## 2023-12-11 DIAGNOSIS — S8002XA Contusion of left knee, initial encounter: Secondary | ICD-10-CM | POA: Insufficient documentation

## 2023-12-11 DIAGNOSIS — Z91040 Latex allergy status: Secondary | ICD-10-CM | POA: Diagnosis not present

## 2023-12-11 DIAGNOSIS — Z7984 Long term (current) use of oral hypoglycemic drugs: Secondary | ICD-10-CM | POA: Diagnosis not present

## 2023-12-11 DIAGNOSIS — B9689 Other specified bacterial agents as the cause of diseases classified elsewhere: Secondary | ICD-10-CM | POA: Diagnosis not present

## 2023-12-11 DIAGNOSIS — M7989 Other specified soft tissue disorders: Secondary | ICD-10-CM | POA: Diagnosis not present

## 2023-12-11 DIAGNOSIS — G9389 Other specified disorders of brain: Secondary | ICD-10-CM | POA: Diagnosis not present

## 2023-12-11 DIAGNOSIS — Z743 Need for continuous supervision: Secondary | ICD-10-CM | POA: Diagnosis not present

## 2023-12-11 DIAGNOSIS — Z043 Encounter for examination and observation following other accident: Secondary | ICD-10-CM | POA: Diagnosis not present

## 2023-12-11 DIAGNOSIS — S8992XA Unspecified injury of left lower leg, initial encounter: Secondary | ICD-10-CM | POA: Diagnosis present

## 2023-12-11 DIAGNOSIS — R531 Weakness: Secondary | ICD-10-CM | POA: Diagnosis not present

## 2023-12-11 LAB — COMPREHENSIVE METABOLIC PANEL
ALT: 14 U/L (ref 0–44)
AST: 23 U/L (ref 15–41)
Albumin: 3.5 g/dL (ref 3.5–5.0)
Alkaline Phosphatase: 60 U/L (ref 38–126)
Anion gap: 12 (ref 5–15)
BUN: 28 mg/dL — ABNORMAL HIGH (ref 8–23)
CO2: 24 mmol/L (ref 22–32)
Calcium: 9.5 mg/dL (ref 8.9–10.3)
Chloride: 105 mmol/L (ref 98–111)
Creatinine, Ser: 1.07 mg/dL — ABNORMAL HIGH (ref 0.44–1.00)
GFR, Estimated: 56 mL/min — ABNORMAL LOW (ref >60)
Glucose, Bld: 200 mg/dL — ABNORMAL HIGH (ref 70–99)
Potassium: 4.8 mmol/L (ref 3.5–5.1)
Sodium: 141 mmol/L (ref 135–145)
Total Bilirubin: 0.8 mg/dL (ref 0.0–1.2)
Total Protein: 7.2 g/dL (ref 6.5–8.1)

## 2023-12-11 LAB — CBC WITH DIFFERENTIAL/PLATELET
Abs Immature Granulocytes: 0.02 10*3/uL (ref 0.00–0.07)
Basophils Absolute: 0 10*3/uL (ref 0.0–0.1)
Basophils Relative: 0 %
Eosinophils Absolute: 0.1 10*3/uL (ref 0.0–0.5)
Eosinophils Relative: 2 %
HCT: 41.8 % (ref 36.0–46.0)
Hemoglobin: 12.5 g/dL (ref 12.0–15.0)
Immature Granulocytes: 0 %
Lymphocytes Relative: 16 %
Lymphs Abs: 1 10*3/uL (ref 0.7–4.0)
MCH: 30.3 pg (ref 26.0–34.0)
MCHC: 29.9 g/dL — ABNORMAL LOW (ref 30.0–36.0)
MCV: 101.5 fL — ABNORMAL HIGH (ref 80.0–100.0)
Monocytes Absolute: 0.4 10*3/uL (ref 0.1–1.0)
Monocytes Relative: 6 %
Neutro Abs: 4.6 10*3/uL (ref 1.7–7.7)
Neutrophils Relative %: 76 %
Platelets: 186 10*3/uL (ref 150–400)
RBC: 4.12 MIL/uL (ref 3.87–5.11)
RDW: 12.1 % (ref 11.5–15.5)
WBC: 6.1 10*3/uL (ref 4.0–10.5)
nRBC: 0 % (ref 0.0–0.2)

## 2023-12-11 NOTE — ED Provider Notes (Signed)
 [place] EMERGENCY DEPARTMENT AT [facility] Provider Note   CSN: [id number] Arrival date & time: 12/11/23  2020     History  Chief Complaint  Patient presents with   [place]    Mary Swanson is a 68 y.o. female with PMH as listed below who presents from [facility] for unwitnessed fall. Hx of dementia/nonverbal at her baseline. Patient was found face down with swelling to left knee. Chart review doesn't show that she takes blood thinners. EMS VS 150/60, pulse 84, 94% cbg 229  Attempted to call [facility] and patient contact but was unable to reach anyone.  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 Authorizing Provider  acetaminophen  (TYLENOL ) 325 MG tablet Take 650 mg by mouth every 8 (eight) hours as needed for moderate pain, fever or headache.    [provider]  AgaMatrix Ultra-Thin Lancets MISC Check blood glucose 3 times daily before meals and as needed 06/25/19   Adella Norris, MD  amLODipine  (NORVASC ) 10 MG tablet Take 1 tablet (10 mg total) by mouth daily. 01/02/20   Sebastian Toribio GAILS, MD  aspirin  81 MG chewable tablet Chew 81 mg by mouth daily.     [provider]  Blood Glucose Monitoring Suppl (AGAMATRIX PRESTO) w/Device KIT Check blood glucose three times daily before meals and as needed 06/25/19   Adella Norris,  MD  feeding supplement, GLUCERNA SHAKE, (GLUCERNA SHAKE) LIQD Take 237 mLs by mouth 2 (two) times daily between meals.    [provider]  ferrous sulfate  325 (65 FE) MG tablet Take 325 mg by mouth daily with breakfast.    [provider]  glucose blood (AGAMATRIX PRESTO TEST) test strip Check blood glucose 3 times daily before meals and as needed 06/25/19   Adella Norris, MD  insulin  glargine (LANTUS  SOLOSTAR) 100 UNIT/ML Solostar Pen Inject 18 Units into the skin daily. Patient taking differently: Inject 15-25 Units into the skin as directed. Inject 15 units [place] daily in am, Inject 25 units [place] at HS 06/18/21   Sebastian Toribio GAILS, MD  Insulin  Pen Needle (BD PEN NEEDLE NANO U/F) 32G X 4 MM MISC 1 Stick by Does not apply route every morning. 06/05/17   Rennie Toribio CROME, MD  LORazepam  (ATIVAN ) 0.5 MG tablet Take 1 tablet (0.5 mg total) by mouth in the morning and at bedtime. Patient taking differently: Take 0.5 mg by mouth 3 (three) times daily. 01/12/22   Leotis Bogus, MD  metFORMIN  (GLUCOPHAGE -XR) 500 MG 24 hr tablet Take 1,000 mg by mouth daily with breakfast.    [provider]  Multiple Vitamin (MULTIVITAMIN WITH MINERALS) TABS tablet Take 1 tablet by mouth daily. 01/02/20   Sebastian Toribio GAILS, MD  rosuvastatin  (CRESTOR ) 20 MG tablet Take 20 mg by mouth at bedtime.    [provider]  thiamine  100 MG tablet Take  1 tablet (100 mg total) by mouth daily. 01/01/20   Sebastian Toribio GAILS, MD      Allergies    Erythromycin, Latex, and Lisinopril     Review of Systems   Review of Systems A 10 point review of systems was performed and is negative unless otherwise reported in HPI.  Physical Exam Updated Vital Signs BP (!) 163/78 (BP Location: Left Arm)   Pulse 74   Temp 97.6 F (36.4 C) (Axillary)   Resp 14   LMP  (LMP Unknown)   SpO2 99%  Physical Exam General: Normal appearing elderly female, lying in bed.  HEENT: NCAT. PERRLA, Sclera anicteric, MMM,  trachea midline.  Cardiology: RRR, no murmurs/rubs/gallops.  Resp: Normal respiratory rate and effort. CTAB, no wheezes, rhonchi, crackles.  Abd: Soft, non-tender, non-distended. No rebound tenderness or guarding.  Pelvis: Pelvis stable/nontender. MSK: No peripheral edema or signs of trauma. Extremities without deformity or TTP. FROM of BL knees with assistance. No significant effusions or swelling to BL LEs. Intact DP pulses bilaterally.  Skin: warm, dry.  Back: No midline C, T or L spine TTP, deformities, or stepoffs.  Neuro: Alert, unable to assess orientation, CNs II-XII grossly intact. MAEs. Sensation grossly intact.   ED Results / Procedures / Treatments   Labs (all labs ordered are listed, but only abnormal results are displayed) Labs Reviewed  CBC WITH DIFFERENTIAL/PLATELET - Abnormal; Notable for the following components:      Result Value   MCV 101.5 (*)    MCHC 29.9 (*)    All other components within normal limits  COMPREHENSIVE METABOLIC PANEL - Abnormal; Notable for the following components:   Glucose, Bld 200 (*)    BUN 28 (*)    Creatinine, Ser 1.07 (*)    GFR, Estimated 56 (*)    All other components within normal limits  URINALYSIS, ROUTINE W REFLEX MICROSCOPIC    EKG EKG Interpretation Date/Time:  Tuesday December 11 2023 23:50:50 EST Ventricular Rate:  68 PR Interval:  136 QRS Duration:  80 QT Interval:  398 QTC Calculation: 423 R Axis:   46  Text Interpretation: Normal sinus rhythm with sinus arrhythmia Normal ECG Confirmed by Franklyn Gills [phone]) on 12/11/2023 11:53:11 PM  Radiology CT Cervical Spine Wo Contrast Result Date: 12/11/2023 CLINICAL DATA:  Unwitnessed fall. EXAM: CT CERVICAL SPINE WITHOUT CONTRAST TECHNIQUE: Multidetector CT imaging of the cervical spine was performed without intravenous contrast. Multiplanar CT image reconstructions were also generated. RADIATION DOSE REDUCTION: This exam was performed according to the departmental  dose-optimization program which includes automated exposure control, adjustment of the mA and/or kV according to patient size and/or use of iterative reconstruction technique. COMPARISON:  October 02, 2020 FINDINGS: Alignment: Normal. Skull base and vertebrae: No acute fracture. No primary bone lesion or focal pathologic process. Soft tissues and spinal canal: No prevertebral fluid or swelling. No visible canal hematoma. Disc levels: Normal multilevel endplates are seen with normal multilevel intervertebral disc spaces. Normal, bilateral multilevel facet joints are noted. Upper chest: Negative. Other: None. IMPRESSION: No acute fracture or subluxation in the cervical spine. Electronically Signed   By: Suzen Dials M.D.   On: 12/11/2023 22:19   CT Head Wo Contrast Result Date: 12/11/2023 CLINICAL DATA:  Unwitnessed fall. EXAM: CT HEAD WITHOUT CONTRAST TECHNIQUE: Contiguous axial images were obtained from the base of the skull through the vertex without intravenous contrast. RADIATION DOSE REDUCTION: This exam was performed according to the departmental dose-optimization program which includes automated exposure control, adjustment of  the mA and/or kV according to patient size and/or use of iterative reconstruction technique. COMPARISON:  January 06, 2022 FINDINGS: Brain: There is mild cerebral atrophy with widening of the extra-axial spaces and ventricular dilatation. There are areas of decreased attenuation within the white matter tracts of the supratentorial brain, consistent with microvascular disease changes. Vascular: No hyperdense vessel or unexpected calcification. Skull: Normal. Negative for fracture or focal lesion. Sinuses/Orbits: No acute finding. Other: None. IMPRESSION: 1. No acute intracranial abnormality. 2. Generalized cerebral atrophy and microvascular disease changes of the supratentorial brain. Electronically Signed   By: Suzen Dials M.D.   On: 12/11/2023 22:15   DG Knee 2 Views  Left Result Date: 12/11/2023 CLINICAL DATA:  Fall with knee swelling EXAM: LEFT KNEE - 1-2 VIEW COMPARISON:  None Available. FINDINGS: No fracture or malalignment. No significant knee effusion. Minimal patellofemoral degenerative change IMPRESSION: No acute osseous abnormality. Electronically Signed   By: Luke Bun M.D.   On: 12/11/2023 21:49   DG Chest Portable 1 View Result Date: 12/11/2023 CLINICAL DATA:  Fall EXAM: PORTABLE CHEST 1 VIEW COMPARISON:  01/06/2022 FINDINGS: The heart size and mediastinal contours are within normal limits. Both lungs are clear. The visualized skeletal structures are unremarkable. Peripherally calcified breast implants. IMPRESSION: No active disease. Electronically Signed   By: Luke Bun M.D.   On: 12/11/2023 21:49    Procedures Procedures    Medications Ordered in ED Medications  nitrofurantoin  (macrocrystal-monohydrate) (MACROBID ) capsule 100 mg (100 mg Oral Given 12/12/23 0215)    ED Course/ Medical Decision Making/ A&P                          Medical Decision Making Amount and/or Complexity of Data Reviewed Labs: ordered. Decision-making details documented in ED Course. Radiology: ordered. Decision-making details documented in ED Course.  Risk Prescription drug management.    This patient presents to the ED for concern of fall at facility, this involves an extensive number of treatment options, and is a complaint that carries with it a high risk of complications and morbidity.  I considered the following differential and admission for this acute, potentially life threatening condition. Pt is mildly hypertensive but otherwise stable and in no acute distress.  MDM:    Traumatic exam and evaluation including CTH/CT C-spine reassuring without acute findings. Consider causes of fall such as electrolyte derangements, anemia. EKG w/o arrhythmia. Consider possible infectious cause such as UTI. Patient unable to give any history. D/t nonverbal  status so will eval w/ labs/urine.   Clinical Course as of 12/12/23 0007  Tue Dec 11, 2023  2246 CT Cervical Spine Wo Contrast No acute fracture or subluxation in the cervical spine. [HN]  2247 CT Head Wo Contrast 1. No acute intracranial abnormality. 2. Generalized cerebral atrophy and microvascular disease changes of the supratentorial brain.   [HN]  2247 DG Knee 2 Views Left 1. No acute intracranial abnormality. 2. Generalized cerebral atrophy and microvascular disease changes of the supratentorial brain.   [HN]  2247 DG Chest Portable 1 View No active disease. [HN]  2248 CBC with Differential(!) Unremarkable in the context of this patient's presentation  [HN]  2249 BUN(!): 28 Mildly elevated BUN, and HGB higher than baseline, could be mildly dehydrated [HN]  Wed Dec 12, 2023  0005 Attempted to call patient's legal guardian Tilton Rouleau, who states that patient had a fall today. She states that as far as she knows, she has been in her  normal state of health. She walks around. Normally is nonverbal. Patient lives in the assisted living part of [place].  [HN]    Clinical Course User Index [HN] Franklyn Sid SAILOR, MD    Labs: I Ordered, and personally interpreted labs.  The pertinent results include: Those listed above  Imaging Studies ordered: I ordered imaging studies including CT head, CT C-spine, chest x-ray, knee x-ray I independently visualized and interpreted imaging. I agree with the radiologist interpretation  Additional history obtained from chart review.    Reevaluation: After the interventions noted above, I reevaluated the patient and found that they have : Stayed the same  Social Determinants of Health:  lives at facility  Disposition:  Patient is signed out to the oncoming ED physician Dr. Griselda who is made aware of her history, presentation, exam, workup, and plan.    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   I have reviewed the patients home medicines and have made adjustments as needed  Problem List / ED Course: Problem List Items Addressed This Visit   None Visit Diagnoses       Fall, initial encounter    -  Primary     Contusion of left knee, initial encounter         Acute UTI       Relevant Medications   nitrofurantoin  (macrocrystal-monohydrate) (MACROBID ) capsule 100 mg (Completed)   nitrofurantoin , macrocrystal-monohydrate, (MACROBID ) 100 MG capsule                   This note was created using dictation software, which may contain spelling or grammatical errors.    Franklyn Sid SAILOR, MD 12/15/23 [phone]

## 2023-12-11 NOTE — ED Triage Notes (Signed)
 Pt from [facility] for unwitnessed fall. Hx of dementia/ nonverbal Found face down; swelling to left knee EMS VS 150/60, pulse 84, 94% cbg 229

## 2023-12-12 DIAGNOSIS — W19XXXA Unspecified fall, initial encounter: Secondary | ICD-10-CM | POA: Diagnosis not present

## 2023-12-12 DIAGNOSIS — Z6824 Body mass index (BMI) 24.0-24.9, adult: Secondary | ICD-10-CM | POA: Diagnosis not present

## 2023-12-12 DIAGNOSIS — Z87440 Personal history of urinary (tract) infections: Secondary | ICD-10-CM | POA: Diagnosis not present

## 2023-12-12 DIAGNOSIS — E785 Hyperlipidemia, unspecified: Secondary | ICD-10-CM | POA: Diagnosis not present

## 2023-12-12 DIAGNOSIS — E1165 Type 2 diabetes mellitus with hyperglycemia: Secondary | ICD-10-CM | POA: Diagnosis not present

## 2023-12-12 DIAGNOSIS — L84 Corns and callosities: Secondary | ICD-10-CM | POA: Diagnosis not present

## 2023-12-12 DIAGNOSIS — Z556 Problems related to health literacy: Secondary | ICD-10-CM | POA: Diagnosis not present

## 2023-12-12 DIAGNOSIS — E559 Vitamin D deficiency, unspecified: Secondary | ICD-10-CM | POA: Diagnosis not present

## 2023-12-12 DIAGNOSIS — Z794 Long term (current) use of insulin: Secondary | ICD-10-CM | POA: Diagnosis not present

## 2023-12-12 DIAGNOSIS — Z8616 Personal history of COVID-19: Secondary | ICD-10-CM | POA: Diagnosis not present

## 2023-12-12 DIAGNOSIS — I10 Essential (primary) hypertension: Secondary | ICD-10-CM | POA: Diagnosis not present

## 2023-12-12 DIAGNOSIS — Z7984 Long term (current) use of oral hypoglycemic drugs: Secondary | ICD-10-CM | POA: Diagnosis not present

## 2023-12-12 DIAGNOSIS — E039 Hypothyroidism, unspecified: Secondary | ICD-10-CM | POA: Diagnosis not present

## 2023-12-12 DIAGNOSIS — R6889 Other general symptoms and signs: Secondary | ICD-10-CM | POA: Diagnosis not present

## 2023-12-12 DIAGNOSIS — Z7982 Long term (current) use of aspirin: Secondary | ICD-10-CM | POA: Diagnosis not present

## 2023-12-12 DIAGNOSIS — Z743 Need for continuous supervision: Secondary | ICD-10-CM | POA: Diagnosis not present

## 2023-12-12 DIAGNOSIS — Z7401 Bed confinement status: Secondary | ICD-10-CM | POA: Diagnosis not present

## 2023-12-12 LAB — URINALYSIS, ROUTINE W REFLEX MICROSCOPIC
Bilirubin Urine: NEGATIVE
Glucose, UA: NEGATIVE mg/dL
Hgb urine dipstick: NEGATIVE
Ketones, ur: NEGATIVE mg/dL
Leukocytes,Ua: NEGATIVE
Nitrite: POSITIVE — AB
Protein, ur: 100 mg/dL — AB
Specific Gravity, Urine: 1.02 (ref 1.005–1.030)
pH: 5 (ref 5.0–8.0)

## 2023-12-12 MED ORDER — NITROFURANTOIN MONOHYD MACRO 100 MG PO CAPS
100.0000 mg | ORAL_CAPSULE | Freq: Once | ORAL | Status: AC
  Administered 2023-12-12: 100 mg via ORAL
  Filled 2023-12-12: qty 1

## 2023-12-12 MED ORDER — NITROFURANTOIN MONOHYD MACRO 100 MG PO CAPS: 100.0000 mg | ORAL_CAPSULE | Freq: Two times a day (BID) | ORAL | 0 refills | Status: DC

## 2023-12-12 NOTE — ED Provider Notes (Signed)
 Care assumed at 2330.  Patient nonverbal at baseline, history of dementia here after unwitnessed fall.  Care assumed pending urinalysis.  UA is consistent with UTI.  Patient is unable to verbalize if she has symptoms although there is no evidence of sepsis, no abdominal tenderness on examination.  Will start on antibiotics for cystitis with outpatient follow-up and return precautions.   Kelsey Patricia, MD 12/12/23 [phone]

## 2023-12-12 NOTE — ED Notes (Signed)
 Spoke with facility regarding baseline, pt usually resistant to ambulating however will with guidance; report given to [facility] at [facility] regarding new prescription

## 2023-12-12 NOTE — ED Notes (Signed)
 Pt. Soiled brief with urine and small, brown bm. Pt. Cleaned up with peri-cleanse on washcloth and dryed. Pt.new brief applied.

## 2023-12-12 NOTE — ED Notes (Addendum)
 Attempted to ambulate pt. with 2 assist, pt. Unable to stand on feet with assistance. Pt. With unsteady gait, RN informed.

## 2023-12-12 NOTE — ED Notes (Signed)
 [facility] contacted for transport

## 2023-12-13 DIAGNOSIS — R2689 Other abnormalities of gait and mobility: Secondary | ICD-10-CM | POA: Diagnosis not present

## 2023-12-13 DIAGNOSIS — S8002XA Contusion of left knee, initial encounter: Secondary | ICD-10-CM | POA: Diagnosis not present

## 2023-12-13 DIAGNOSIS — W1830XA Fall on same level, unspecified, initial encounter: Secondary | ICD-10-CM | POA: Diagnosis not present

## 2023-12-13 DIAGNOSIS — N390 Urinary tract infection, site not specified: Secondary | ICD-10-CM | POA: Diagnosis not present

## 2023-12-19 DIAGNOSIS — E1165 Type 2 diabetes mellitus with hyperglycemia: Secondary | ICD-10-CM | POA: Diagnosis not present

## 2023-12-22 ENCOUNTER — Emergency Department (HOSPITAL_COMMUNITY)
Admission: EM | Admit: 2023-12-22 | Discharge: 2023-12-23 | Disposition: A | Discharge status: Skilled Nursing Facility | Payer: Medicare Other | Attending: Emergency Medicine | Admitting: Emergency Medicine

## 2023-12-22 ENCOUNTER — Encounter (HOSPITAL_COMMUNITY): Payer: Self-pay

## 2023-12-22 ENCOUNTER — Emergency Department (HOSPITAL_COMMUNITY): Payer: Medicare Other

## 2023-12-22 ENCOUNTER — Other Ambulatory Visit: Payer: Self-pay

## 2023-12-22 DIAGNOSIS — W19XXXA Unspecified fall, initial encounter: Secondary | ICD-10-CM

## 2023-12-22 DIAGNOSIS — S0990XA Unspecified injury of head, initial encounter: Secondary | ICD-10-CM | POA: Insufficient documentation

## 2023-12-22 DIAGNOSIS — W010XXA Fall on same level from slipping, tripping and stumbling without subsequent striking against object, initial encounter: Secondary | ICD-10-CM | POA: Insufficient documentation

## 2023-12-22 DIAGNOSIS — J439 Emphysema, unspecified: Secondary | ICD-10-CM | POA: Diagnosis not present

## 2023-12-22 DIAGNOSIS — R404 Transient alteration of awareness: Secondary | ICD-10-CM | POA: Diagnosis not present

## 2023-12-22 DIAGNOSIS — Z91040 Latex allergy status: Secondary | ICD-10-CM | POA: Diagnosis not present

## 2023-12-22 DIAGNOSIS — Z7984 Long term (current) use of oral hypoglycemic drugs: Secondary | ICD-10-CM | POA: Insufficient documentation

## 2023-12-22 DIAGNOSIS — Z7982 Long term (current) use of aspirin: Secondary | ICD-10-CM | POA: Insufficient documentation

## 2023-12-22 DIAGNOSIS — I10 Essential (primary) hypertension: Secondary | ICD-10-CM | POA: Insufficient documentation

## 2023-12-22 DIAGNOSIS — Z794 Long term (current) use of insulin: Secondary | ICD-10-CM | POA: Insufficient documentation

## 2023-12-22 DIAGNOSIS — Z79899 Other long term (current) drug therapy: Secondary | ICD-10-CM | POA: Diagnosis not present

## 2023-12-22 DIAGNOSIS — M542 Cervicalgia: Secondary | ICD-10-CM | POA: Diagnosis not present

## 2023-12-22 DIAGNOSIS — E119 Type 2 diabetes mellitus without complications: Secondary | ICD-10-CM | POA: Diagnosis not present

## 2023-12-22 NOTE — ED Notes (Signed)
[facility] called and transport arranged.

## 2023-12-22 NOTE — ED Provider Notes (Signed)
[place] EMERGENCY DEPARTMENT AT [facility] Provider Note   CSN: [id number] Arrival date & time: 12/22/23  2046     History  Chief Complaint  Patient presents with   Marletta Lor    Mary Swanson is a 68 y.o. female.  68 year old female female presents from [facility] for concern of trip and fall.  This was a witnessed fall.  I spoke to the tech that witnessed this.  Patient tripped over a blanket falling backwards.  She hit her head.  No obvious injuries.  Patient remained at baseline.  But.  Policy they sent her for evaluation.  Patient is nonverbal at baseline.  The history is provided by the nursing home. No language interpreter was used.       Home Medications Prior to Admission medications   Medication Sig Start Date End Date Taking? Authorizing Provider  acetaminophen (TYLENOL) 325 MG tablet Take 650 mg by mouth every 8 (eight) hours as needed for moderate pain, fever or headache.    [provider]  AgaMatrix Ultra-Thin Lancets MISC Check blood glucose 3 times daily before meals and as needed 06/25/19   Julieanne Manson, MD  amLODipine (NORVASC) 10 MG tablet Take 1 tablet (10 mg total) by mouth daily. 01/02/20   Rodolph Bong, MD  aspirin 81 MG chewable tablet Chew 81 mg by mouth daily.     [provider]  Blood Glucose Monitoring Suppl (AGAMATRIX PRESTO) w/Device KIT Check blood glucose three times daily before meals and as needed 06/25/19   Julieanne Manson, MD  feeding supplement, GLUCERNA SHAKE, (GLUCERNA SHAKE) LIQD Take 237 mLs by mouth 2 (two) times daily between meals.    [provider]  ferrous sulfate 325 (65 FE) MG tablet Take 325 mg by mouth daily with breakfast.    [provider]  glucose blood (AGAMATRIX PRESTO TEST) test strip Check blood glucose 3 times daily before meals and as needed 06/25/19   Julieanne Manson, MD  insulin glargine (LANTUS SOLOSTAR) 100 UNIT/ML Solostar Pen Inject 18 Units into the  skin daily. Patient taking differently: Inject 15-25 Units into the skin as directed. Inject 15 units [place] daily in am, Inject 25 units [place] at [facility] 06/18/21   Rodolph Bong, MD  Insulin Pen Needle (BD PEN NEEDLE NANO U/F) 32G X 4 MM MISC 1 Stick by Does not apply route every morning. 06/05/17   Renne Musca, MD  LORazepam (ATIVAN) 0.5 MG tablet Take 1 tablet (0.5 mg total) by mouth in the morning and at bedtime. Patient taking differently: Take 0.5 mg by mouth 3 (three) times daily. 01/12/22   Willeen Niece, MD  metFORMIN (GLUCOPHAGE-XR) 500 MG 24 hr tablet Take 1,000 mg by mouth daily with breakfast.    [provider]  Multiple Vitamin (MULTIVITAMIN WITH MINERALS) TABS tablet Take 1 tablet by mouth daily. 01/02/20   Rodolph Bong, MD  nitrofurantoin, macrocrystal-monohydrate, (MACROBID) 100 MG capsule Take 1 capsule (100 mg total) by mouth 2 (two) times daily. 12/12/23   Tilden Fossa, MD  rosuvastatin (CRESTOR) 20 MG tablet Take 20 mg by mouth at bedtime.    [provider]  thiamine 100 MG tablet Take 1 tablet (100 mg total) by mouth daily. 01/01/20   Rodolph Bong, MD      Allergies    Erythromycin, Latex, and Lisinopril    Review of Systems   Review of Systems  Unable to perform ROS: Dementia    Physical Exam Updated Vital  Signs BP (!) 115/99   Pulse 80   Temp 97.7 F (36.5 C) (Axillary) Comment: Simultaneous filing. User may not have seen previous data.  Resp 16 Comment: Simultaneous filing. User may not have seen previous data.  LMP  (LMP Unknown)   SpO2 99%  Physical Exam Vitals and nursing note reviewed.  Constitutional:      General: She is not in acute distress.    Appearance: Normal appearance. She is not ill-appearing.  HENT:     Head: Normocephalic and atraumatic.     Comments: No obvious injuries, or step-offs.    Nose: Nose normal.  Eyes:     Conjunctiva/sclera: Conjunctivae normal.  Cardiovascular:     Rate and Rhythm: Normal  rate.  Pulmonary:     Effort: Pulmonary effort is normal. No respiratory distress.  Musculoskeletal:        General: No deformity.  Skin:    Findings: No rash.  Neurological:     Mental Status: She is alert.     ED Results / Procedures / Treatments   Labs (all labs ordered are listed, but only abnormal results are displayed) Labs Reviewed - No data to display  EKG None  Radiology No results found.  Procedures Procedures    Medications Ordered in ED Medications - No data to display  ED Course/ Medical Decision Making/ A&P                                 Medical Decision Making Amount and/or Complexity of Data Reviewed Radiology: ordered.   Medical Decision Making / ED Course   This patient presents to the ED for concern of fall, this involves an extensive number of treatment options, and is a complaint that carries with it a high risk of complications and morbidity.  The differential diagnosis includes acute intracranial bleed concussion fracture  MDM: 68 year old female presents today for concern of fall.  This was a witnessed fall.  She is not on anticoagulation.  There is no loss of consciousness.  Patient tripped and fell backwards.  She is nonverbal at baseline.  According to staff she remained at baseline but per policy they sent her for evaluation.  Admission considered but will reevaluate after imaging.  CT imaging normal of head and C-spine.  Discharged in stable condition.  Legal guardian notified.   Lab Tests: -I ordered, reviewed, and interpreted labs.   The pertinent results include:   Labs Reviewed - No data to display    EKG  EKG Interpretation Date/Time:    Ventricular Rate:    PR Interval:    QRS Duration:    QT Interval:    QTC Calculation:   R Axis:      Text Interpretation:           Imaging Studies ordered: I ordered imaging studies including CT head, CT cervical spine I independently visualized and interpreted  imaging. I agree with the radiologist interpretation   Medicines ordered and prescription drug management: No orders of the defined types were placed in this encounter.   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  Appropriate for discharge back to facility.  Patient's legal guardian [name] notified.   Final Clinical Impression(s) / ED Diagnoses Final diagnoses:  Fall, initial encounter  Injury of head, initial encounter    Rx / DC Orders ED Discharge Orders     None         Marita Kansas, PA-C 12/22/23 2339    Alvira Monday, MD 12/24/23 [phone]

## 2023-12-22 NOTE — ED Notes (Signed)
Patient transported to CT then transferred to hall bed

## 2023-12-22 NOTE — ED Triage Notes (Signed)
BIBA from [facility]-tripped and fell, facility reports she hit her head, no obvious injuries, no thinners. 130/90 BP 98% room air 76 HR 242 cbg

## 2023-12-22 NOTE — Discharge Instructions (Addendum)
CT scan of the head and neck were without concern.  Follow-up with your primary care provider.  Return for any emergent symptoms.

## 2023-12-23 DIAGNOSIS — R531 Weakness: Secondary | ICD-10-CM | POA: Diagnosis not present

## 2023-12-23 DIAGNOSIS — Z7401 Bed confinement status: Secondary | ICD-10-CM | POA: Diagnosis not present

## 2023-12-23 DIAGNOSIS — Z743 Need for continuous supervision: Secondary | ICD-10-CM | POA: Diagnosis not present

## 2023-12-26 DIAGNOSIS — W1830XA Fall on same level, unspecified, initial encounter: Secondary | ICD-10-CM | POA: Diagnosis not present

## 2023-12-26 DIAGNOSIS — E162 Hypoglycemia, unspecified: Secondary | ICD-10-CM | POA: Diagnosis not present

## 2023-12-26 DIAGNOSIS — E1165 Type 2 diabetes mellitus with hyperglycemia: Secondary | ICD-10-CM | POA: Diagnosis not present

## 2023-12-26 DIAGNOSIS — R296 Repeated falls: Secondary | ICD-10-CM | POA: Diagnosis not present

## 2023-12-26 DIAGNOSIS — R2681 Unsteadiness on feet: Secondary | ICD-10-CM | POA: Diagnosis not present

## 2024-01-05 ENCOUNTER — Emergency Department (HOSPITAL_COMMUNITY): Payer: Medicare Other

## 2024-01-05 ENCOUNTER — Emergency Department (HOSPITAL_COMMUNITY)
Admission: EM | Admit: 2024-01-05 | Discharge: 2024-01-05 | Disposition: A | Discharge status: Home / Self Care | Payer: Medicare Other | Attending: Emergency Medicine | Admitting: Emergency Medicine

## 2024-01-05 DIAGNOSIS — Z79899 Other long term (current) drug therapy: Secondary | ICD-10-CM | POA: Diagnosis not present

## 2024-01-05 DIAGNOSIS — Z794 Long term (current) use of insulin: Secondary | ICD-10-CM | POA: Diagnosis not present

## 2024-01-05 DIAGNOSIS — W19XXXA Unspecified fall, initial encounter: Secondary | ICD-10-CM | POA: Diagnosis not present

## 2024-01-05 DIAGNOSIS — Z7982 Long term (current) use of aspirin: Secondary | ICD-10-CM | POA: Diagnosis not present

## 2024-01-05 DIAGNOSIS — S5002XA Contusion of left elbow, initial encounter: Secondary | ICD-10-CM | POA: Insufficient documentation

## 2024-01-05 DIAGNOSIS — Z91040 Latex allergy status: Secondary | ICD-10-CM | POA: Insufficient documentation

## 2024-01-05 DIAGNOSIS — F0390 Unspecified dementia without behavioral disturbance: Secondary | ICD-10-CM | POA: Diagnosis not present

## 2024-01-05 DIAGNOSIS — S59902A Unspecified injury of left elbow, initial encounter: Secondary | ICD-10-CM | POA: Diagnosis present

## 2024-01-05 DIAGNOSIS — I10 Essential (primary) hypertension: Secondary | ICD-10-CM | POA: Diagnosis not present

## 2024-01-05 MED ORDER — ACETAMINOPHEN 325 MG PO TABS
650.0000 mg | ORAL_TABLET | Freq: Once | ORAL | Status: DC
  Filled 2024-01-05: qty 2

## 2024-01-05 NOTE — ED Notes (Addendum)
 Called legal guardian and informed them of patient's discharge, and will be going back to [facility]

## 2024-01-05 NOTE — ED Triage Notes (Signed)
 Per [facility] from [facility]. Had a mechanical fall on left side. Reported no injuries. Skin tear on left elbow. Aside from fall blisters on left arm. Hx of dimentia. Non-verbal baseline. No LOC. No blood thinners. Hx of diabetes.  BP 138/70 HR 62 O2 98 RA CBG 212

## 2024-01-05 NOTE — ED Notes (Signed)
 [facility] called and setup transportation

## 2024-01-05 NOTE — ED Notes (Signed)
 Report given to [place] at [facility]

## 2024-01-05 NOTE — ED Provider Notes (Signed)
  EMERGENCY DEPARTMENT AT [facility] Provider Note   CSN: [id number] Arrival date & time: 01/05/24  9188     History  Chief Complaint  Patient presents with   Mary    Earlyn Swanson is a 68 y.o. female.  Pt is a 68 yo female with pmhx significant for dementia, HTN, and nonverbal status.  Pt was at her facility and was walking just in socks.  She slipped and fell and landed on her left elbow. She did not hit her head.  No loc.  No blood thinners.         Home Medications Prior to Admission medications   Medication Sig Start Date End Date Taking? Authorizing Provider  acetaminophen  (TYLENOL ) 325 MG tablet Take 650 mg by mouth every 8 (eight) hours as needed for moderate pain, fever or headache.    [provider]  AgaMatrix Ultra-Thin Lancets MISC Check blood glucose 3 times daily before meals and as needed 06/25/19   Adella Norris, MD  amLODipine  (NORVASC ) 10 MG tablet Take 1 tablet (10 mg total) by mouth daily. 01/02/20   Sebastian Toribio GAILS, MD  aspirin  81 MG chewable tablet Chew 81 mg by mouth daily.     [provider]  Blood Glucose Monitoring Suppl (AGAMATRIX PRESTO) w/Device KIT Check blood glucose three times daily before meals and as needed 06/25/19   Adella Norris, MD  feeding supplement, GLUCERNA SHAKE, (GLUCERNA SHAKE) LIQD Take 237 mLs by mouth 2 (two) times daily between meals.    [provider]  ferrous sulfate  325 (65 FE) MG tablet Take 325 mg by mouth daily with breakfast.    [provider]  glucose blood (AGAMATRIX PRESTO TEST) test strip Check blood glucose 3 times daily before meals and as needed 06/25/19   Adella Norris, MD  insulin  glargine (LANTUS  SOLOSTAR) 100 UNIT/ML Solostar Pen Inject 18 Units into the skin daily. Patient taking differently: Inject 15-25 Units into the skin as directed. Inject 15 units [place] daily in am, Inject 25 units [place] at [facility] 06/18/21   Sebastian Toribio GAILS, MD  Insulin  Pen  Needle (BD PEN NEEDLE NANO U/F) 32G X 4 MM MISC 1 Stick by Does not apply route every morning. 06/05/17   Rennie Toribio CROME, MD  LORazepam  (ATIVAN ) 0.5 MG tablet Take 1 tablet (0.5 mg total) by mouth in the morning and at bedtime. Patient taking differently: Take 0.5 mg by mouth 3 (three) times daily. 01/12/22   Leotis Bogus, MD  metFORMIN  (GLUCOPHAGE -XR) 500 MG 24 hr tablet Take 1,000 mg by mouth daily with breakfast.    [provider]  Multiple Vitamin (MULTIVITAMIN WITH MINERALS) TABS tablet Take 1 tablet by mouth daily. 01/02/20   Sebastian Toribio GAILS, MD  nitrofurantoin , macrocrystal-monohydrate, (MACROBID ) 100 MG capsule Take 1 capsule (100 mg total) by mouth 2 (two) times daily. 12/12/23   Griselda Norris, MD  rosuvastatin  (CRESTOR ) 20 MG tablet Take 20 mg by mouth at bedtime.    [provider]  thiamine  100 MG tablet Take 1 tablet (100 mg total) by mouth daily. 01/01/20   Sebastian Toribio GAILS, MD      Allergies    Erythromycin, Latex, and Lisinopril     Review of Systems   Review of Systems  Unable to perform ROS: Dementia  Musculoskeletal:        Left elbow injury  All other systems reviewed and are negative.   Physical Exam Updated Vital Signs BP (!) 160/89   Pulse  73   Temp (!) 97.4 F (36.3 C) (Axillary)   Resp 16   LMP  (LMP Unknown)   SpO2 100%  Physical Exam Vitals and nursing note reviewed.  Constitutional:      Appearance: Normal appearance.  HENT:     Head: Normocephalic and atraumatic.     Right Ear: External ear normal.     Left Ear: External ear normal.     Nose: Nose normal.     Mouth/Throat:     Mouth: Mucous membranes are dry.  Eyes:     Extraocular Movements: Extraocular movements intact.     Conjunctiva/sclera: Conjunctivae normal.     Pupils: Pupils are equal, round, and reactive to light.  Cardiovascular:     Rate and Rhythm: Normal rate and regular rhythm.     Pulses: Normal pulses.     Heart sounds: Normal heart sounds.   Pulmonary:     Effort: Pulmonary effort is normal.     Breath sounds: Normal breath sounds.  Abdominal:     General: Abdomen is flat. Bowel sounds are normal.     Palpations: Abdomen is soft.  Musculoskeletal:     Cervical back: Normal range of motion and neck supple.  Skin:    Capillary Refill: Capillary refill takes less than 2 seconds.     Comments: dressing  Neurological:     General: No focal deficit present.     Mental Status: She is alert.     Comments: Pt is moving all 4 extremities.  She is following commands.  She is nonverbal at baseline per EMS.  Psychiatric:        Mood and Affect: Mood normal.        Behavior: Behavior normal.     ED Results / Procedures / Treatments   Labs (all labs ordered are listed, but only abnormal results are displayed) Labs Reviewed - No data to display  EKG None  Radiology DG Elbow Complete Left Result Date: 01/05/2024 CLINICAL DATA:  Pain status post fall EXAM: LEFT ELBOW - COMPLETE 3+ VIEW COMPARISON:  None available FINDINGS: No fracture, dislocation, or soft tissue abnormality. IMPRESSION: No acute osseous abnormality of the left elbow. Electronically Signed   By: Aliene Lloyd M.D.   On: 01/05/2024 09:38    Procedures Procedures    Medications Ordered in ED Medications  acetaminophen  (TYLENOL ) tablet 650 mg (650 mg Oral Not Given 01/05/24 0910)    ED Course/ Medical Decision Making/ A&P                                 Medical Decision Making Amount and/or Complexity of Data Reviewed Radiology: ordered.  Risk OTC drugs.   This patient presents to the ED for concern of fall, this involves an extensive number of treatment options, and is a complaint that carries with it a high risk of complications and morbidity.  The differential diagnosis includes multiple trauma   Co morbidities that complicate the patient evaluation  dementia, HTN, and nonverbal status   Additional history obtained:  Additional history  obtained from epic chart review External records from outside source obtained and reviewed including EMS report   Imaging Studies ordered:  I ordered imaging studies including left elbow  I independently visualized and interpreted imaging which showed No acute osseous abnormality of the left elbow.  I agree with the radiologist interpretation  Medicines ordered and prescription drug management:  I  ordered medication including tylenol   for sx  Reevaluation of the patient after these medicines showed that the patient improved I have reviewed the patients home medicines and have made adjustments as needed   Problem List / ED Course:  L elbow contusion:  no fx.  Pt is able to ambulate.  Pt is stable for d/c.  Return if worse.    Reevaluation:  After the interventions noted above, I reevaluated the patient and found that they have :improved   Social Determinants of Health:  Lives in a facility   Dispostion:  After consideration of the diagnostic results and the patients response to treatment, I feel that the patent would benefit from discharge with outpatient f/u.          Final Clinical Impression(s) / ED Diagnoses Final diagnoses:  Fall, initial encounter  Contusion of left elbow, initial encounter    Rx / DC Orders ED Discharge Orders     None         Dean Clarity, MD 01/05/24 [phone]

## 2024-01-05 NOTE — ED Notes (Signed)
 Provided patient with non-slip socks and ambulated patient in the hallway/ Assisted patient to the patient, no need.

## 2024-01-31 ENCOUNTER — Emergency Department (HOSPITAL_COMMUNITY)

## 2024-01-31 ENCOUNTER — Inpatient Hospital Stay (HOSPITAL_COMMUNITY)
Admission: EM | Admit: 2024-01-31 | Discharge: 2024-02-08 | DRG: 682 | Disposition: A | Discharge status: Nursing Home (Includes ICF) | Source: Skilled Nursing Facility | Attending: Internal Medicine | Admitting: Internal Medicine

## 2024-01-31 DIAGNOSIS — Z7984 Long term (current) use of oral hypoglycemic drugs: Secondary | ICD-10-CM | POA: Diagnosis not present

## 2024-01-31 DIAGNOSIS — E870 Hyperosmolality and hypernatremia: Secondary | ICD-10-CM | POA: Diagnosis present

## 2024-01-31 DIAGNOSIS — Z888 Allergy status to other drugs, medicaments and biological substances status: Secondary | ICD-10-CM | POA: Diagnosis not present

## 2024-01-31 DIAGNOSIS — Z515 Encounter for palliative care: Secondary | ICD-10-CM

## 2024-01-31 DIAGNOSIS — D539 Nutritional anemia, unspecified: Secondary | ICD-10-CM | POA: Diagnosis present

## 2024-01-31 DIAGNOSIS — E878 Other disorders of electrolyte and fluid balance, not elsewhere classified: Secondary | ICD-10-CM | POA: Diagnosis present

## 2024-01-31 DIAGNOSIS — F0280 Dementia in other diseases classified elsewhere without behavioral disturbance: Secondary | ICD-10-CM | POA: Diagnosis present

## 2024-01-31 DIAGNOSIS — E7800 Pure hypercholesterolemia, unspecified: Secondary | ICD-10-CM | POA: Diagnosis present

## 2024-01-31 DIAGNOSIS — E860 Dehydration: Secondary | ICD-10-CM | POA: Diagnosis present

## 2024-01-31 DIAGNOSIS — R4182 Altered mental status, unspecified: Principal | ICD-10-CM

## 2024-01-31 DIAGNOSIS — E871 Hypo-osmolality and hyponatremia: Secondary | ICD-10-CM | POA: Diagnosis present

## 2024-01-31 DIAGNOSIS — Z79899 Other long term (current) drug therapy: Secondary | ICD-10-CM

## 2024-01-31 DIAGNOSIS — E1165 Type 2 diabetes mellitus with hyperglycemia: Secondary | ICD-10-CM | POA: Diagnosis present

## 2024-01-31 DIAGNOSIS — G9341 Metabolic encephalopathy: Secondary | ICD-10-CM | POA: Diagnosis present

## 2024-01-31 DIAGNOSIS — I10 Essential (primary) hypertension: Secondary | ICD-10-CM | POA: Diagnosis present

## 2024-01-31 DIAGNOSIS — Z794 Long term (current) use of insulin: Secondary | ICD-10-CM

## 2024-01-31 DIAGNOSIS — G309 Alzheimer's disease, unspecified: Secondary | ICD-10-CM | POA: Diagnosis present

## 2024-01-31 DIAGNOSIS — Z66 Do not resuscitate: Secondary | ICD-10-CM | POA: Diagnosis present

## 2024-01-31 DIAGNOSIS — N390 Urinary tract infection, site not specified: Secondary | ICD-10-CM | POA: Diagnosis present

## 2024-01-31 DIAGNOSIS — Z1152 Encounter for screening for COVID-19: Secondary | ICD-10-CM | POA: Diagnosis not present

## 2024-01-31 DIAGNOSIS — N3090 Cystitis, unspecified without hematuria: Secondary | ICD-10-CM | POA: Diagnosis present

## 2024-01-31 DIAGNOSIS — N179 Acute kidney failure, unspecified: Secondary | ICD-10-CM | POA: Diagnosis present

## 2024-01-31 DIAGNOSIS — Z881 Allergy status to other antibiotic agents status: Secondary | ICD-10-CM

## 2024-01-31 DIAGNOSIS — E8809 Other disorders of plasma-protein metabolism, not elsewhere classified: Secondary | ICD-10-CM | POA: Diagnosis present

## 2024-01-31 DIAGNOSIS — Z91040 Latex allergy status: Secondary | ICD-10-CM

## 2024-01-31 DIAGNOSIS — B9620 Unspecified Escherichia coli [E. coli] as the cause of diseases classified elsewhere: Secondary | ICD-10-CM | POA: Diagnosis present

## 2024-01-31 DIAGNOSIS — E872 Acidosis, unspecified: Secondary | ICD-10-CM | POA: Diagnosis present

## 2024-01-31 DIAGNOSIS — B9689 Other specified bacterial agents as the cause of diseases classified elsewhere: Secondary | ICD-10-CM | POA: Diagnosis present

## 2024-01-31 DIAGNOSIS — E785 Hyperlipidemia, unspecified: Secondary | ICD-10-CM | POA: Diagnosis not present

## 2024-01-31 DIAGNOSIS — D696 Thrombocytopenia, unspecified: Secondary | ICD-10-CM | POA: Diagnosis present

## 2024-01-31 DIAGNOSIS — R7989 Other specified abnormal findings of blood chemistry: Secondary | ICD-10-CM | POA: Diagnosis present

## 2024-01-31 LAB — COMPREHENSIVE METABOLIC PANEL
ALT: 17 U/L (ref 0–44)
AST: 15 U/L (ref 15–41)
Albumin: 3.4 g/dL — ABNORMAL LOW (ref 3.5–5.0)
Alkaline Phosphatase: 59 U/L (ref 38–126)
BUN: 102 mg/dL — ABNORMAL HIGH (ref 8–23)
CO2: 24 mmol/L (ref 22–32)
Calcium: 10.2 mg/dL (ref 8.9–10.3)
Chloride: >130 mmol/L (ref 98–111)
Creatinine, Ser: 4.92 mg/dL — ABNORMAL HIGH (ref 0.44–1.00)
GFR, Estimated: 9 mL/min — ABNORMAL LOW (ref >60)
Glucose, Bld: 167 mg/dL — ABNORMAL HIGH (ref 70–99)
Potassium: 5 mmol/L (ref 3.5–5.1)
Sodium: 172 mmol/L (ref 135–145)
Total Bilirubin: 1.7 mg/dL — ABNORMAL HIGH (ref 0.0–1.2)
Total Protein: 7.3 g/dL (ref 6.5–8.1)

## 2024-01-31 LAB — URINALYSIS, W/ REFLEX TO CULTURE (INFECTION SUSPECTED)
Bilirubin Urine: NEGATIVE
Glucose, UA: NEGATIVE mg/dL
Ketones, ur: NEGATIVE mg/dL
Nitrite: POSITIVE — AB
Protein, ur: 30 mg/dL — AB
Specific Gravity, Urine: 1.016 (ref 1.005–1.030)
pH: 5 (ref 5.0–8.0)

## 2024-01-31 LAB — CBC
HCT: 43.5 % (ref 36.0–46.0)
Hemoglobin: 12.8 g/dL (ref 12.0–15.0)
MCH: 30.9 pg (ref 26.0–34.0)
MCHC: 29.4 g/dL — ABNORMAL LOW (ref 30.0–36.0)
MCV: 105.1 fL — ABNORMAL HIGH (ref 80.0–100.0)
Platelets: 185 10*3/uL (ref 150–400)
RBC: 4.14 MIL/uL (ref 3.87–5.11)
RDW: 13.2 % (ref 11.5–15.5)
WBC: 10.2 10*3/uL (ref 4.0–10.5)
nRBC: 0 % (ref 0.0–0.2)

## 2024-01-31 LAB — RESP PANEL BY RT-PCR (RSV, FLU A&B, COVID)  RVPGX2
Influenza A by PCR: NEGATIVE
Influenza B by PCR: NEGATIVE
Resp Syncytial Virus by PCR: NEGATIVE
SARS Coronavirus 2 by RT PCR: NEGATIVE

## 2024-01-31 LAB — OSMOLALITY: Osmolality: 413 mosm/kg (ref 275–295)

## 2024-01-31 LAB — AMMONIA: Ammonia: 19 umol/L (ref 9–35)

## 2024-01-31 LAB — CBG MONITORING, ED
Glucose-Capillary: 155 mg/dL — ABNORMAL HIGH (ref 70–99)
Glucose-Capillary: 151 mg/dL — ABNORMAL HIGH (ref 70–99)

## 2024-01-31 LAB — GLUCOSE, CAPILLARY: Glucose-Capillary: 88 mg/dL (ref 70–99)

## 2024-01-31 MED ORDER — SODIUM CHLORIDE 0.9 % IV SOLN: Freq: Once | INTRAVENOUS | Status: AC

## 2024-01-31 MED ORDER — DEXTROSE 5 % IV SOLN: INTRAVENOUS | Status: DC

## 2024-01-31 MED ORDER — HEPARIN SODIUM (PORCINE) 5000 UNIT/ML IJ SOLN
5000.0000 [IU] | Freq: Three times a day (TID) | INTRAMUSCULAR | Status: DC
  Administered 2024-01-31 – 2024-02-05 (×15): 5000 [IU] via SUBCUTANEOUS
  Filled 2024-01-31 (×15): qty 1

## 2024-01-31 MED ORDER — SODIUM CHLORIDE 0.9 % IV SOLN
1.0000 g | INTRAVENOUS | Status: AC
  Administered 2024-01-31 – 2024-02-04 (×5): 1 g via INTRAVENOUS
  Filled 2024-01-31 (×5): qty 10

## 2024-01-31 MED ORDER — SODIUM CHLORIDE 0.9 % IV SOLN
1.0000 g | Freq: Once | INTRAVENOUS | Status: AC
  Administered 2024-01-31: 1 g via INTRAVENOUS
  Filled 2024-01-31: qty 10

## 2024-01-31 MED ORDER — SODIUM CHLORIDE 0.9 % IV BOLUS
500.0000 mL | Freq: Once | INTRAVENOUS | Status: AC
  Administered 2024-01-31: 500 mL via INTRAVENOUS

## 2024-01-31 MED ORDER — METOPROLOL TARTRATE 5 MG/5ML IV SOLN: 5.0000 mg | Freq: Four times a day (QID) | INTRAVENOUS | Status: DC | PRN

## 2024-01-31 MED ORDER — INSULIN GLARGINE-YFGN 100 UNIT/ML ~~LOC~~ SOLN: 5.0000 [IU] | Freq: Every day | SUBCUTANEOUS | Status: DC

## 2024-01-31 MED ORDER — FENTANYL CITRATE PF 50 MCG/ML IJ SOSY: 12.5000 ug | PREFILLED_SYRINGE | INTRAMUSCULAR | Status: DC | PRN

## 2024-01-31 MED ORDER — INSULIN ASPART 100 UNIT/ML IJ SOLN
0.0000 [IU] | INTRAMUSCULAR | Status: DC
  Administered 2024-02-01: 3 [IU] via SUBCUTANEOUS
  Administered 2024-02-01: 7 [IU] via SUBCUTANEOUS
  Administered 2024-02-01: 3 [IU] via SUBCUTANEOUS
  Administered 2024-02-01: 7 [IU] via SUBCUTANEOUS
  Administered 2024-02-01 – 2024-02-02 (×2): 2 [IU] via SUBCUTANEOUS
  Administered 2024-02-02: 3 [IU] via SUBCUTANEOUS
  Administered 2024-02-02: 7 [IU] via SUBCUTANEOUS
  Administered 2024-02-02: 2 [IU] via SUBCUTANEOUS

## 2024-01-31 MED ORDER — ONDANSETRON HCL 4 MG PO TABS: 4.0000 mg | ORAL_TABLET | Freq: Four times a day (QID) | ORAL | Status: DC | PRN

## 2024-01-31 MED ORDER — ONDANSETRON HCL 4 MG/2ML IJ SOLN: 4.0000 mg | Freq: Four times a day (QID) | INTRAMUSCULAR | Status: DC | PRN

## 2024-01-31 MED ORDER — INSULIN GLARGINE 100 UNIT/ML ~~LOC~~ SOLN
5.0000 [IU] | Freq: Every day | SUBCUTANEOUS | Status: DC
  Administered 2024-02-01 – 2024-02-03 (×3): 5 [IU] via SUBCUTANEOUS
  Filled 2024-01-31 (×5): qty 0.05

## 2024-01-31 NOTE — ED Notes (Signed)
 Difficulty obtaining labs on patient. Phlebotomy asked to see patient.

## 2024-01-31 NOTE — ED Provider Notes (Signed)
 [place] EMERGENCY DEPARTMENT AT [facility] Provider Note   CSN: [id number] Arrival date & time: 01/31/24  1458     History  Chief Complaint  Patient presents with   Altered Mental Status    Mary Swanson is a 68 y.o. female.  With history of hypertension, hypercholesterolemia, IDDM 2, nonverbal status, DNR/DNI on palliative care with [facility] presenting to the ED for evaluation of altered mental status.  Lives at [facility].  Facility reports a steady decline over the past 3 weeks, acutely worsened yesterday before going to bed.  Was reportedly not responding appropriately when she woke up this morning.   Discussed with [facility].  This has been a progressive decline over the past week.  No acute changes.  Reportedly has not been eating very well.  Has not been ambulatory for 2 to 3 days when she is typically very ambulatory.  No fevers or abnormal vital signs documented.  She has been taking her medications but they wonder if she has been placing the medications in her gums.  No falls.  She is nonverbal at baseline.  She does not communicate in any way.   Altered Mental Status      Home Medications Prior to Admission medications   Medication Sig Start Date End Date Taking? Authorizing Provider  acetaminophen (TYLENOL) 325 MG tablet Take 650 mg by mouth every 8 (eight) hours as needed for moderate pain, fever or headache.   Yes [provider]  amLODipine (NORVASC) 10 MG tablet Take 1 tablet (10 mg total) by mouth daily. 01/02/20  Yes Rodolph Bong, MD  Emollient (EUCERIN) lotion Apply 1 Application topically daily. To bilateral feet   Yes [provider]  feeding supplement, GLUCERNA SHAKE, (GLUCERNA SHAKE) LIQD Take 237 mLs by mouth as directed. Between meals   Yes [provider]  ferrous sulfate 220 (44 Fe) MG/5ML solution Take 7.5 mLs by mouth daily.   Yes [provider]  insulin glargine (LANTUS SOLOSTAR) 100  UNIT/ML Solostar Pen Inject 18 Units into the skin daily. Patient taking differently: Inject 5-10 Units into the skin as directed. Inject 10 units [place] daily in am, Inject 5 units [place] at [facility] 06/18/21  Yes Rodolph Bong, MD  LORazepam (ATIVAN) 0.5 MG tablet Take 1 tablet (0.5 mg total) by mouth in the morning and at bedtime. Patient taking differently: Take 0.5 mg by mouth 2 (two) times daily. 01/12/22  Yes Willeen Niece, MD  metFORMIN (GLUCOPHAGE) 1000 MG tablet Take 1,000 mg by mouth 2 (two) times daily. 01/14/24  Yes [provider]  rosuvastatin (CRESTOR) 20 MG tablet Take 20 mg by mouth at bedtime.   Yes [provider]  AgaMatrix Ultra-Thin Lancets MISC Check blood glucose 3 times daily before meals and as needed 06/25/19   Julieanne Manson, MD  Blood Glucose Monitoring Suppl (AGAMATRIX PRESTO) w/Device KIT Check blood glucose three times daily before meals and as needed 06/25/19   Julieanne Manson, MD  glucose blood (AGAMATRIX PRESTO TEST) test strip Check blood glucose 3 times daily before meals and as needed 06/25/19   Julieanne Manson, MD  Insulin Pen Needle (BD PEN NEEDLE NANO U/F) 32G X 4 MM MISC 1 Stick by Does not apply route every morning. 06/05/17   Renne Musca, MD  Multiple Vitamin (MULTIVITAMIN WITH MINERALS) TABS tablet Take 1 tablet by mouth daily. Patient not taking: Reported on 01/31/2024 01/02/20   Rodolph Bong, MD      Allergies  Erythromycin, Latex, and Lisinopril    Review of Systems   Review of Systems  Reason unable to perform ROS: Altered mental status.  Constitutional:  Positive for fatigue.    Physical Exam Updated Vital Signs BP (!) 148/70   Pulse 88   Temp 97.8 F (36.6 C) (Axillary)   Resp 18   LMP  (LMP Unknown)   SpO2 100%  Physical Exam Vitals and nursing note reviewed.  Constitutional:      General: She is not in acute distress.    Appearance: Normal appearance. She is normal weight. She is not ill-appearing.      Comments: Resting in bed, will open eyes and track with painful stimuli  HENT:     Head: Normocephalic and atraumatic.  Pulmonary:     Effort: Pulmonary effort is normal. No respiratory distress.  Abdominal:     General: Abdomen is flat.  Musculoskeletal:        General: Normal range of motion.     Cervical back: Neck supple.  Skin:    General: Skin is warm and dry.  Neurological:     Mental Status: She is alert.     Comments: Nonverbal, noncommunicative     ED Results / Procedures / Treatments   Labs (all labs ordered are listed, but only abnormal results are displayed) Labs Reviewed  COMPREHENSIVE METABOLIC PANEL - Abnormal; Notable for the following components:      Result Value   Sodium 172 (*)    Chloride >130 (*)    Glucose, Bld 167 (*)    BUN 102 (*)    Creatinine, Ser 4.92 (*)    Albumin 3.4 (*)    Total Bilirubin 1.7 (*)    GFR, Estimated 9 (*)    All other components within normal limits  CBC - Abnormal; Notable for the following components:   MCV 105.1 (*)    MCHC 29.4 (*)    All other components within normal limits  URINALYSIS, W/ REFLEX TO CULTURE (INFECTION SUSPECTED) - Abnormal; Notable for the following components:   APPearance HAZY (*)    Hgb urine dipstick MODERATE (*)    Protein, ur 30 (*)    Nitrite POSITIVE (*)    Leukocytes,Ua MODERATE (*)    Bacteria, UA MANY (*)    All other components within normal limits  OSMOLALITY - Abnormal; Notable for the following components:   Osmolality 413 (*)    All other components within normal limits  CBG MONITORING, ED - Abnormal; Notable for the following components:   Glucose-Capillary 155 (*)    All other components within normal limits  CBG MONITORING, ED - Abnormal; Notable for the following components:   Glucose-Capillary 151 (*)    All other components within normal limits  RESP PANEL BY RT-PCR (RSV, FLU A&B, COVID)  RVPGX2  URINE CULTURE  AMMONIA  NA AND K (SODIUM & POTASSIUM), RAND UR     EKG EKG Interpretation Date/Time:  Thursday January 31 2024 17:41:50 EST Ventricular Rate:  95 PR Interval:  102 QRS Duration:  109 QT Interval:  363 QTC Calculation: 457 R Axis:   50  Text Interpretation: Sinus rhythm Short PR interval Borderline ST elevation, lateral leads Confirmed by Tanda Rockers ([id number]) on 01/31/2024 5:42:55 PM  Radiology CT Head Wo Contrast Result Date: 01/31/2024 CLINICAL DATA:  Altered mental status. EXAM: CT HEAD WITHOUT CONTRAST TECHNIQUE: Contiguous axial images were obtained from the base of the skull through the vertex without intravenous contrast. RADIATION DOSE  REDUCTION: This exam was performed according to the departmental dose-optimization program which includes automated exposure control, adjustment of the mA and/or kV according to patient size and/or use of iterative reconstruction technique. COMPARISON:  December 22, 2023 FINDINGS: Brain: There is generalized cerebral atrophy with widening of the extra-axial spaces and stable ventricular dilatation. There are areas of decreased attenuation within the white matter tracts of the supratentorial brain, consistent with microvascular disease changes. Vascular: No hyperdense vessel or unexpected calcification. Skull: Normal. Negative for fracture or focal lesion. Sinuses/Orbits: No acute finding. Other: None. IMPRESSION: 1. Generalized cerebral atrophy and microvascular disease changes of the supratentorial brain. 2. No acute intracranial abnormality. Electronically Signed   By: Aram Candela M.D.   On: 01/31/2024 19:48   DG Chest Port 1 View Result Date: 01/31/2024 CLINICAL DATA:  Altered mental status and failure to thrive. EXAM: PORTABLE CHEST 1 VIEW COMPARISON:  December 11, 2023 FINDINGS: The heart size and mediastinal contours are within normal limits. Both lungs are clear. Calcified bilateral breast implants are seen. The visualized skeletal structures are unremarkable. IMPRESSION: No active cardiopulmonary  disease. Electronically Signed   By: Aram Candela M.D.   On: 01/31/2024 19:45    Procedures .Critical Care  Performed by: Michelle Piper, PA-C Authorized by: Michelle Piper, PA-C   Critical care provider statement:    Critical care time (minutes):  58   Critical care was necessary to treat or prevent imminent or life-threatening deterioration of the following conditions:  Renal failure   Critical care was time spent personally by me on the following activities:  Development of treatment plan with patient or surrogate, discussions with consultants, evaluation of patient's response to treatment, examination of patient, ordering and review of laboratory studies, ordering and review of radiographic studies, ordering and performing treatments and interventions, pulse oximetry, re-evaluation of patient's condition and review of old charts   Care discussed with: admitting provider       Medications Ordered in ED Medications  0.9 %  sodium chloride infusion (has no administration in time range)  sodium chloride 0.9 % bolus 500 mL (500 mLs Intravenous New Bag/Given 01/31/24 2001)  cefTRIAXone (ROCEPHIN) 1 g in sodium chloride 0.9 % 100 mL IVPB (0 g Intravenous Stopped 01/31/24 2121)    ED Course/ Medical Decision Making/ A&P Clinical Course as of 01/31/24 2130  Thu Jan 31, 2024  1921 Spoke with the healthcare agent of the patient, [name] to inform her of laboratory findings and likely admission [AS]  1942 [facility] palliative care patient. Waiting for call back [AS]  2001 Spoke with [facility].  Will initiate IV fluids and antibiotics, consult nephrology for acute renal failure, admit to hospitalist [AS]  2055 Discussed with Dr. Allena Katz with nephrology who recommends normal saline bolus, continuous infusion at 150 cc/h.  No indication for emergent dialysis at this time, repeat labs tomorrow, reconsult if kidney function not improving [AS]  2129 Discussed with hospitalist Dr.  Shawnie Pons who will admit [AS]    Clinical Course User Index [AS] Kumiko Fishman, Edsel Petrin, PA-C                                 Medical Decision Making Amount and/or Complexity of Data Reviewed Labs: ordered. Radiology: ordered.  Risk Prescription drug management. Decision regarding hospitalization.  This patient presents to the ED for concern of altered mental status, this involves an extensive number of treatment options, and is  a complaint that carries with it a high risk of complications and morbidity.  The differential diagnosis for AMS is extensive and includes, but is not limited to: drug overdose - opioids, alcohol, sedatives, antipsychotics, drug withdrawal, others; Metabolic: hypoxia, hypoglycemia, hyperglycemia, hypercalcemia, hypernatremia, hyponatremia, uremia, hepatic encephalopathy, hypothyroidism, hyperthyroidism, vitamin B12 or thiamine deficiency, carbon monoxide poisoning, Wilson's disease, Lactic acidosis, DKA/HHOS; Infectious: meningitis, encephalitis, bacteremia/sepsis, urinary tract infection, pneumonia, neurosyphilis; Structural: Space-occupying lesion, (brain tumor, subdural hematoma, hydrocephalus,); Vascular: stroke, subarachnoid hemorrhage, coronary ischemia, hypertensive encephalopathy, CNS vasculitis, thrombotic thrombocytopenic purpura, disseminated intravascular coagulation, hyperviscosity; Psychiatric: Schizophrenia, depression; Other: Seizure, hypothermia, heat stroke, ICU psychosis, dementia -"sundowning."   My initial workup includes labs, imaging  Additional history obtained from: Nursing notes from this visit. EMS provides a portion of the history Nursing facility provides a portion of the history  I ordered, reviewed and interpreted labs which include: CMP, CBC, ammonia, urinalysis.  Urine consistent with urinary tract infection.  No leukocytosis or anemia.  Hyponatremia of 172, hypochloremia of 130, BUN significant elevated to 102 with creatinine 4.92 with a  baseline of 1.0, total bilirubin 1.7  I ordered imaging studies including chest x-ray, CT head I independently visualized and interpreted imaging which showed no acute cardiopulmonary or intracranial abnormalities I agree with the radiologist interpretation  Cardiac Monitoring:  The patient was maintained on a cardiac monitor.  I personally viewed and interpreted the cardiac monitored which showed an underlying rhythm of: NSR  Consultations Obtained:  I requested consultation with nephrology Dr. Allena Katz,  and discussed lab and imaging findings as well as pertinent plan - they recommend: Normal saline bolus, continuous infusion at 150 cc/h, recheck labs tomorrow  Discussed with hospitalist Dr.Pratt  Afebrile, hypertensive but otherwise hemodynamically stable.  69 year old female presenting to the ED for evaluation of altered mental status, increasing weakness.  She has had decreased oral intake over the past week, progressively so.  Worsened over the past 2 to 3 days.  She is nonverbal and noncommunicative at baseline.  She does not contribute to her own history.  She appears dry on physical exam with increased skin turgor.  Lab workup significant for acute renal failure with a creatinine of 5 with a baseline of 1, BUN of 102 and sodium of 172.  This is likely due to extensive dehydration.  Urine also significant for infection.  This was sent for culture.  I consulted nephrology with recommendations as stated above.  I discussed patient case with palliative care who states they will follow-up.  Discussed case with hospitalist.  Discussed with her healthcare agent.  Stable at the time of admission.  Patient's case discussed with Dr. Wallace Cullens who agrees with plan to admit.   Note: Portions of this report may have been transcribed using voice recognition software. Every effort was made to ensure accuracy; however, inadvertent computerized transcription errors may still be present.        Final  Clinical Impression(s) / ED Diagnoses Final diagnoses:  Altered mental status, unspecified altered mental status type  Cystitis  Acute renal failure, unspecified acute renal failure type [facility])  Hypernatremia    Rx / DC Orders ED Discharge Orders     None         Michelle Piper, Cordelia Poche 01/31/24 2130    Sloan Leiter, DO 02/01/24 0020

## 2024-01-31 NOTE — Assessment & Plan Note (Signed)
Holding statin 

## 2024-01-31 NOTE — ED Triage Notes (Signed)
 Pt to ED via [facility] from [facility] place SNF c/o failure to thrive. Non verbal at baseline, but had had steady decline over 3 weeks. Apparently was not her self when went to bed Last night  and was altered and not responding appropriately when woke up this morning, second shift came in and called 911 due to pt not being self. Reported GCS 9.  Very unclear pt baseline.   No medications given by EMS  150/88, hr 96, rr14, 99%RA . CBG 205.

## 2024-01-31 NOTE — Assessment & Plan Note (Signed)
 Aggressive IV fluid resuscitation

## 2024-01-31 NOTE — H&P (Addendum)
 History and Physical    Patient: Mary Swanson ZOX:[id number] DOB: 02/01/1956 DOA: 01/31/2024 DOS: the patient was seen and examined on 01/31/2024 PCP: Patient, No Pcp Per  Patient coming from: SNF  Chief Complaint:  Chief Complaint  Patient presents with   Altered Mental Status   HPI: Mary Swanson is a 68 y.o. female with medical history significant of  HTN, HLD, T2DM, Alzheimer's dementia and nonverbal status, DNR/DNI and palliative care presents with altered mental status.  Patient is from [facility].  Facility reports a decline over the last 3 weeks that acutely worsened today.  The patient has not had much to eat or drink in the last 1 week.  Has not been ambulatory for the past 2 to 3 days.  No fevers or chills noted. In the ED noted to have significant acute kidney injury with creatinine of 4.92 severe hypernatremia with a sodium of 172 and elevated T. bili at 1.7. Review of Systems: unable to review all systems due to the inability of the patient to answer questions. Past Medical History:  Diagnosis Date   ACE-inhibitor cough 03/14/2019   Acute lower UTI 06/16/2021   Acute metabolic encephalopathy 12/28/2019   AKI (acute kidney injury) (HCC) 12/27/2019   Altered mental status 06/15/2021   Dehydration 12/27/2019   Diabetes mellitus (HCC) 12/28/2019   Diabetes mellitus without complication (HCC)    Diabetic foot infection (HCC) 01/06/2022   Hypernatremia 06/16/2021   Hypertension    Memory loss of unknown cause 12/03/2019   Sepsis due to Streptococcus pyogenes due to right foot cellulitis 01/06/2022   Transaminitis 01/06/2022   Past Surgical History:  Procedure Laterality Date   CATARACT EXTRACTION, BILATERAL Bilateral    Social History:  reports that she has never smoked. She has never used smokeless tobacco. She reports that she does not currently use alcohol. She reports that she does not use drugs.  Allergies  Allergen Reactions   Erythromycin Diarrhea   Latex Hives    Lisinopril Cough    Family History  Problem Relation Age of Onset   Diabetes Neg Hx     Prior to Admission medications   Medication Sig Start Date End Date Taking? Authorizing Provider  acetaminophen (TYLENOL) 325 MG tablet Take 650 mg by mouth every 8 (eight) hours as needed for moderate pain, fever or headache.   Yes [provider]  amLODipine (NORVASC) 10 MG tablet Take 1 tablet (10 mg total) by mouth daily. 01/02/20  Yes Rodolph Bong, MD  Emollient (EUCERIN) lotion Apply 1 Application topically daily. To bilateral feet   Yes [provider]  feeding supplement, GLUCERNA SHAKE, (GLUCERNA SHAKE) LIQD Take 237 mLs by mouth as directed. Between meals   Yes [provider]  ferrous sulfate 220 (44 Fe) MG/5ML solution Take 7.5 mLs by mouth daily.   Yes [provider]  insulin glargine (LANTUS SOLOSTAR) 100 UNIT/ML Solostar Pen Inject 18 Units into the skin daily. Patient taking differently: Inject 5-10 Units into the skin as directed. Inject 10 units [place] daily in am, Inject 5 units [facility] 06/18/21  Yes Rodolph Bong, MD  LORazepam (ATIVAN) 0.5 MG tablet Take 1 tablet (0.5 mg total) by mouth in the morning and at bedtime. Patient taking differently: Take 0.5 mg by mouth 2 (two) times daily. 01/12/22  Yes Willeen Niece, MD  metFORMIN (GLUCOPHAGE) 1000 MG tablet Take 1,000 mg by mouth 2 (two) times daily. 01/14/24  Yes [provider]  rosuvastatin (CRESTOR) 20  MG tablet Take 20 mg by mouth at bedtime.   Yes [provider]  AgaMatrix Ultra-Thin Lancets MISC Check blood glucose 3 times daily before meals and as needed 06/25/19   Julieanne Manson, MD  Blood Glucose Monitoring Suppl (AGAMATRIX PRESTO) w/Device KIT Check blood glucose three times daily before meals and as needed 06/25/19   Julieanne Manson, MD  glucose blood (AGAMATRIX PRESTO TEST) test strip Check blood glucose 3 times daily before meals and as needed 06/25/19    Julieanne Manson, MD  Insulin Pen Needle (BD PEN NEEDLE NANO U/F) 32G X 4 MM MISC 1 Stick by Does not apply route every morning. 06/05/17   Renne Musca, MD  Multiple Vitamin (MULTIVITAMIN WITH MINERALS) TABS tablet Take 1 tablet by mouth daily. Patient not taking: Reported on 01/31/2024 01/02/20   Rodolph Bong, MD    Physical Exam: Vitals:   01/31/24 1730 01/31/24 1745 01/31/24 1815 01/31/24 1845  BP: 139/78 (!) 149/117 (!) 152/69 (!) 148/70  Pulse: 95 90 92 88  Resp: (!) 21 14 13 18   Temp:      TempSrc:      SpO2: 100% 100% 100% 100%   Physical Examination: General appearance - chronically ill appearing and thin, dehydrated Chest - clear to auscultation, no wheezes, rales or rhonchi, symmetric air entry Heart - normal rate, regular rhythm, normal S1, S2, no murmurs, rubs, clicks or gallops Abdomen - soft, nontender, nondistended, no masses or organomegaly Extremities - peripheral pulses normal, no pedal edema, no clubbing or cyanosis  Data Reviewed: Results for orders placed or performed during the hospital encounter of 01/31/24 (from the past 24 hours)  CBG monitoring, ED     Status: Abnormal   Collection Time: 01/31/24  3:16 PM  Result Value Ref Range   Glucose-Capillary 155 (H) 70 - 99 mg/dL  CBG monitoring, ED     Status: Abnormal   Collection Time: 01/31/24  4:44 PM  Result Value Ref Range   Glucose-Capillary 151 (H) 70 - 99 mg/dL  Urinalysis, w/ Reflex to Culture (Infection Suspected) -Urine, Clean Catch     Status: Abnormal   Collection Time: 01/31/24  5:42 PM  Result Value Ref Range   Specimen Source URINE, CLEAN CATCH    Color, Urine YELLOW YELLOW   APPearance HAZY (A) CLEAR   Specific Gravity, Urine 1.016 1.005 - 1.030   pH 5.0 5.0 - 8.0   Glucose, UA NEGATIVE NEGATIVE mg/dL   Hgb urine dipstick MODERATE (A) NEGATIVE   Bilirubin Urine NEGATIVE NEGATIVE   Ketones, ur NEGATIVE NEGATIVE mg/dL   Protein, ur 30 (A) NEGATIVE mg/dL   Nitrite POSITIVE (A)  NEGATIVE   Leukocytes,Ua MODERATE (A) NEGATIVE   RBC / HPF 0-5 0 - 5 RBC/hpf   WBC, UA 21-50 0 - 5 WBC/hpf   Bacteria, UA MANY (A) NONE SEEN   Squamous Epithelial / HPF 0-5 0 - 5 /HPF  Comprehensive metabolic panel     Status: Abnormal   Collection Time: 01/31/24  6:09 PM  Result Value Ref Range   Sodium 172 (HH) 135 - 145 mmol/L   Potassium 5.0 3.5 - 5.1 mmol/L   Chloride >130 (HH) 98 - 111 mmol/L   CO2 24 22 - 32 mmol/L   Glucose, Bld 167 (H) 70 - 99 mg/dL   BUN 161 (H) 8 - 23 mg/dL   Creatinine, Ser 0.96 (H) 0.44 - 1.00 mg/dL   Calcium 04.5 8.9 - 40.9 mg/dL   Total Protein 7.3  6.5 - 8.1 g/dL   Albumin 3.4 (L) 3.5 - 5.0 g/dL   AST 15 15 - 41 U/L   ALT 17 0 - 44 U/L   Alkaline Phosphatase 59 38 - 126 U/L   Total Bilirubin 1.7 (H) 0.0 - 1.2 mg/dL   GFR, Estimated 9 (L) >60 mL/min   Anion gap NOT CALCULATED 5 - 15  CBC     Status: Abnormal   Collection Time: 01/31/24  6:09 PM  Result Value Ref Range   WBC 10.2 4.0 - 10.5 K/uL   RBC 4.14 3.87 - 5.11 MIL/uL   Hemoglobin 12.8 12.0 - 15.0 g/dL   HCT 54.0 98.1 - 19.1 %   MCV 105.1 (H) 80.0 - 100.0 fL   MCH 30.9 26.0 - 34.0 pg   MCHC 29.4 (L) 30.0 - 36.0 g/dL   RDW 47.8 29.5 - 62.1 %   Platelets 185 150 - 400 K/uL   nRBC 0.0 0.0 - 0.2 %  Ammonia     Status: None   Collection Time: 01/31/24  6:09 PM  Result Value Ref Range   Ammonia 19 9 - 35 umol/L  Resp panel by RT-PCR (RSV, Flu A&B, Covid) Anterior Nasal Swab     Status: None   Collection Time: 01/31/24  7:47 PM   Specimen: Anterior Nasal Swab  Result Value Ref Range   SARS Coronavirus 2 by RT PCR NEGATIVE NEGATIVE   Influenza A by PCR NEGATIVE NEGATIVE   Influenza B by PCR NEGATIVE NEGATIVE   Resp Syncytial Virus by PCR NEGATIVE NEGATIVE  Osmolality     Status: Abnormal   Collection Time: 01/31/24  8:10 PM  Result Value Ref Range   Osmolality 413 (HH) 275 - 295 mOsm/kg   CT Head Wo Contrast Result Date: 01/31/2024 CLINICAL DATA:  Altered mental status. EXAM: CT HEAD  WITHOUT CONTRAST TECHNIQUE: Contiguous axial images were obtained from the base of the skull through the vertex without intravenous contrast. RADIATION DOSE REDUCTION: This exam was performed according to the departmental dose-optimization program which includes automated exposure control, adjustment of the mA and/or kV according to patient size and/or use of iterative reconstruction technique. COMPARISON:  December 22, 2023 FINDINGS: Brain: There is generalized cerebral atrophy with widening of the extra-axial spaces and stable ventricular dilatation. There are areas of decreased attenuation within the white matter tracts of the supratentorial brain, consistent with microvascular disease changes. Vascular: No hyperdense vessel or unexpected calcification. Skull: Normal. Negative for fracture or focal lesion. Sinuses/Orbits: No acute finding. Other: None. IMPRESSION: 1. Generalized cerebral atrophy and microvascular disease changes of the supratentorial brain. 2. No acute intracranial abnormality. Electronically Signed   By: Aram Candela M.D.   On: 01/31/2024 19:48   DG Chest Port 1 View Result Date: 01/31/2024 CLINICAL DATA:  Altered mental status and failure to thrive. EXAM: PORTABLE CHEST 1 VIEW COMPARISON:  December 11, 2023 FINDINGS: The heart size and mediastinal contours are within normal limits. Both lungs are clear. Calcified bilateral breast implants are seen. The visualized skeletal structures are unremarkable. IMPRESSION: No active cardiopulmonary disease. Electronically Signed   By: Aram Candela M.D.   On: 01/31/2024 19:45   DG Elbow Complete Left Result Date: 01/05/2024 CLINICAL DATA:  Pain status post fall EXAM: LEFT ELBOW - COMPLETE 3+ VIEW COMPARISON:  None available FINDINGS: No fracture, dislocation, or soft tissue abnormality. IMPRESSION: No acute osseous abnormality of the left elbow. Electronically Signed   By: Acquanetta Belling M.D.   On: 01/05/2024  09:38     Assessment and Plan: *  Acute kidney injury [facility]) Nephrology was consulted by EDP who suggested hydration and to let them know if she is not improving.  Type 2 diabetes mellitus with hyperglycemia, with long-term current use of insulin (HCC) On long-acting insulin Sliding scale insulin for daytime coverage Holding her metformin  UTI (urinary tract infection) Possibly based on UA. Check urine culture Rocephin 1 g IV every 24.  Dehydration Aggressive IV fluid resuscitation  Hyperlipidemia Holding statin   Please note patient has MOST forms in the pink a tab in this chart.  It looks like limited medical resuscitation is desired.  Will consult with palliative to ensure that we are not being overly aggressive in her care here.   Advance Care Planning:   Code Status: Prior DNR/DNI  Consults: Curbside with nephrology  Family Communication: ED provider spoke with patient's legal guardian  Severity of Illness: The appropriate patient status for this patient is INPATIENT. Inpatient status is judged to be reasonable and necessary in order to provide the required intensity of service to ensure the patient's safety. The patient's presenting symptoms, physical exam findings, and initial radiographic and laboratory data in the context of their chronic comorbidities is felt to place them at high risk for further clinical deterioration. Furthermore, it is not anticipated that the patient will be medically stable for discharge from the hospital within 2 midnights of admission.   * I certify that at the point of admission it is my clinical judgment that the patient will require inpatient hospital care spanning beyond 2 midnights from the point of admission due to high intensity of service, high risk for further deterioration and high frequency of surveillance required.*  Author: Reva Bores, MD 01/31/2024 9:35 PM  For on call review [email or web address].

## 2024-01-31 NOTE — Hospital Course (Signed)
 68 year old with history of HTN, HLD, T2DM, Alzheimer's dementia and nonverbal status, DNR/DNI and palliative care presents with altered mental status.  Patient is from [facility].  Facility reports a decline over the last 3 weeks that acutely worsened today.  The patient has not had much to eat or drink in the last 1 week.  Has not been ambulatory for the past 2 to 3 days.  No fevers or chills noted. In the ED noted to have significant acute kidney injury with creatinine of 4.92 severe hypernatremia with a sodium of 172 and elevated T. bili at 1.7.

## 2024-01-31 NOTE — Assessment & Plan Note (Signed)
 Possibly based on UA. Check urine culture Rocephin 1 g IV every 24.

## 2024-01-31 NOTE — Assessment & Plan Note (Signed)
 On long-acting insulin Sliding scale insulin for daytime coverage Holding her metformin

## 2024-01-31 NOTE — ED Notes (Signed)
 Spoke with pt legal guardian on the phone, [name].

## 2024-01-31 NOTE — Assessment & Plan Note (Signed)
 Nephrology was consulted by EDP who suggested hydration and to let them know if she is not improving.

## 2024-02-01 DIAGNOSIS — Z794 Long term (current) use of insulin: Secondary | ICD-10-CM

## 2024-02-01 DIAGNOSIS — E860 Dehydration: Secondary | ICD-10-CM

## 2024-02-01 DIAGNOSIS — E870 Hyperosmolality and hypernatremia: Secondary | ICD-10-CM

## 2024-02-01 DIAGNOSIS — E785 Hyperlipidemia, unspecified: Secondary | ICD-10-CM | POA: Diagnosis not present

## 2024-02-01 DIAGNOSIS — N179 Acute kidney failure, unspecified: Secondary | ICD-10-CM

## 2024-02-01 DIAGNOSIS — N390 Urinary tract infection, site not specified: Secondary | ICD-10-CM

## 2024-02-01 DIAGNOSIS — E1165 Type 2 diabetes mellitus with hyperglycemia: Secondary | ICD-10-CM

## 2024-02-01 LAB — BASIC METABOLIC PANEL
Anion gap: 12 (ref 5–15)
BUN: 91 mg/dL — ABNORMAL HIGH (ref 8–23)
BUN: 88 mg/dL — ABNORMAL HIGH (ref 8–23)
BUN: 84 mg/dL — ABNORMAL HIGH (ref 8–23)
BUN: 81 mg/dL — ABNORMAL HIGH (ref 8–23)
CO2: 20 mmol/L — ABNORMAL LOW (ref 22–32)
CO2: 22 mmol/L (ref 22–32)
CO2: 20 mmol/L — ABNORMAL LOW (ref 22–32)
CO2: 23 mmol/L (ref 22–32)
Calcium: 9 mg/dL (ref 8.9–10.3)
Calcium: 9 mg/dL (ref 8.9–10.3)
Calcium: 9.1 mg/dL (ref 8.9–10.3)
Calcium: 9.1 mg/dL (ref 8.9–10.3)
Chloride: >130 mmol/L (ref 98–111)
Chloride: >130 mmol/L (ref 98–111)
Chloride: >130 mmol/L (ref 98–111)
Chloride: 128 mmol/L — ABNORMAL HIGH (ref 98–111)
Creatinine, Ser: 4.22 mg/dL — ABNORMAL HIGH (ref 0.44–1.00)
Creatinine, Ser: 4.08 mg/dL — ABNORMAL HIGH (ref 0.44–1.00)
Creatinine, Ser: 3.95 mg/dL — ABNORMAL HIGH (ref 0.44–1.00)
Creatinine, Ser: 3.78 mg/dL — ABNORMAL HIGH (ref 0.44–1.00)
GFR, Estimated: 11 mL/min — ABNORMAL LOW (ref >60)
GFR, Estimated: 11 mL/min — ABNORMAL LOW (ref >60)
GFR, Estimated: 12 mL/min — ABNORMAL LOW (ref >60)
GFR, Estimated: 12 mL/min — ABNORMAL LOW (ref >60)
Glucose, Bld: 239 mg/dL — ABNORMAL HIGH (ref 70–99)
Glucose, Bld: 259 mg/dL — ABNORMAL HIGH (ref 70–99)
Glucose, Bld: 374 mg/dL — ABNORMAL HIGH (ref 70–99)
Glucose, Bld: 369 mg/dL — ABNORMAL HIGH (ref 70–99)
Potassium: 4.1 mmol/L (ref 3.5–5.1)
Potassium: 3.9 mmol/L (ref 3.5–5.1)
Potassium: 4 mmol/L (ref 3.5–5.1)
Potassium: 4.5 mmol/L (ref 3.5–5.1)
Sodium: 168 mmol/L (ref 135–145)
Sodium: 165 mmol/L (ref 135–145)
Sodium: 164 mmol/L (ref 135–145)
Sodium: 163 mmol/L (ref 135–145)

## 2024-02-01 LAB — CBC
HCT: 39.1 % (ref 36.0–46.0)
HCT: 37.1 % (ref 36.0–46.0)
Hemoglobin: 11.7 g/dL — ABNORMAL LOW (ref 12.0–15.0)
Hemoglobin: 11 g/dL — ABNORMAL LOW (ref 12.0–15.0)
MCH: 31.1 pg (ref 26.0–34.0)
MCH: 30.9 pg (ref 26.0–34.0)
MCHC: 29.9 g/dL — ABNORMAL LOW (ref 30.0–36.0)
MCHC: 29.6 g/dL — ABNORMAL LOW (ref 30.0–36.0)
MCV: 104 fL — ABNORMAL HIGH (ref 80.0–100.0)
MCV: 104.2 fL — ABNORMAL HIGH (ref 80.0–100.0)
Platelets: 169 10*3/uL (ref 150–400)
Platelets: 153 10*3/uL (ref 150–400)
RBC: 3.76 MIL/uL — ABNORMAL LOW (ref 3.87–5.11)
RBC: 3.56 MIL/uL — ABNORMAL LOW (ref 3.87–5.11)
RDW: 13.3 % (ref 11.5–15.5)
RDW: 13.2 % (ref 11.5–15.5)
WBC: 9 10*3/uL (ref 4.0–10.5)
WBC: 7.5 10*3/uL (ref 4.0–10.5)
nRBC: 0 % (ref 0.0–0.2)
nRBC: 0 % (ref 0.0–0.2)

## 2024-02-01 LAB — COMPREHENSIVE METABOLIC PANEL
ALT: 14 U/L (ref 0–44)
AST: 14 U/L — ABNORMAL LOW (ref 15–41)
Albumin: 3 g/dL — ABNORMAL LOW (ref 3.5–5.0)
Alkaline Phosphatase: 51 U/L (ref 38–126)
BUN: 95 mg/dL — ABNORMAL HIGH (ref 8–23)
CO2: 22 mmol/L (ref 22–32)
Calcium: 9.2 mg/dL (ref 8.9–10.3)
Chloride: >130 mmol/L (ref 98–111)
Creatinine, Ser: 4.36 mg/dL — ABNORMAL HIGH (ref 0.44–1.00)
GFR, Estimated: 10 mL/min — ABNORMAL LOW (ref >60)
Glucose, Bld: 233 mg/dL — ABNORMAL HIGH (ref 70–99)
Potassium: 4.2 mmol/L (ref 3.5–5.1)
Sodium: 168 mmol/L (ref 135–145)
Total Bilirubin: 1.1 mg/dL (ref 0.0–1.2)
Total Protein: 6.3 g/dL — ABNORMAL LOW (ref 6.5–8.1)

## 2024-02-01 LAB — TSH: TSH: 1.361 u[IU]/mL (ref 0.350–4.500)

## 2024-02-01 LAB — GLUCOSE, CAPILLARY
Glucose-Capillary: 172 mg/dL — ABNORMAL HIGH (ref 70–99)
Glucose-Capillary: 206 mg/dL — ABNORMAL HIGH (ref 70–99)
Glucose-Capillary: 235 mg/dL — ABNORMAL HIGH (ref 70–99)
Glucose-Capillary: 330 mg/dL — ABNORMAL HIGH (ref 70–99)
Glucose-Capillary: 344 mg/dL — ABNORMAL HIGH (ref 70–99)

## 2024-02-01 LAB — HEMOGLOBIN A1C
Hgb A1c MFr Bld: 8.1 % — ABNORMAL HIGH (ref 4.8–5.6)
Mean Plasma Glucose: 185.77 mg/dL

## 2024-02-01 LAB — CREATININE, SERUM
Creatinine, Ser: 4.78 mg/dL — ABNORMAL HIGH (ref 0.44–1.00)
GFR, Estimated: 9 mL/min — ABNORMAL LOW (ref >60)

## 2024-02-01 LAB — HIV ANTIBODY (ROUTINE TESTING W REFLEX): HIV Screen 4th Generation wRfx: NONREACTIVE

## 2024-02-01 MED ORDER — DEXTROSE 5 % IV SOLN: INTRAVENOUS | Status: DC

## 2024-02-01 MED ORDER — ORAL CARE MOUTH RINSE: 15.0000 mL | OROMUCOSAL | Status: DC | PRN

## 2024-02-01 NOTE — Progress Notes (Signed)
 [facility] Liaison Note:  This patient is currently enrolled in [facility] outpatient-based palliative care.   Please call for any outpatient based palliative care related questions or concerns.  Thank you, Glenna Fellows, BSN, RN, OCN [facility] Liaison [phone]

## 2024-02-01 NOTE — Hospital Course (Addendum)
 Patient is a 68 year old African-American female with a past medical history significant for essential hypertension, hyperlipidemia, diabetes mellitus type 2, history of Alzheimer's dementia and nonverbal status who resides in a memory care unit.  She is currently DNR/DNI and palliative care follows her in outpatient setting [facility].  Her facility reported of decline over the last 3 weeks and acutely worsened yesterday.  Patient has not had very good oral intake over the last week or so and has not been ambulatory for last few days.  In the ED she was noted to have significant dehydration with an elevated creatinine of 4.92 and severe hypernatremia and elevated bilirubin.  She is awake but not alert and oriented.  SLP evaluated and recommending a dysphagia 2 thin liquid diet for now.  Given her significant hyponatremia we have started her on D5W and sodium is slowly trending down as well as her AKI. Further w/u also reveals an E Coli UTI for which she is on IV Ceftriaxone.   Assessment and Plan:  Significant Dehydration with associated Hypernatremia: Improving, Na+ Trend: Recent Labs  Lab 02/02/24 0002 02/02/24 0420 02/02/24 1331 02/02/24 1954 02/03/24 0158 02/03/24 0757 02/03/24 1259  NA 161* 161* 152* 152* 156* 158* 156*  -Sodium is slowly improving with D5W; patient is getting D5 @ 75 mL/hr now. TSH was 1.361.  Continue to monitor BMPs every 4 hours and try not to overcorrect.  Current rate of fluid should to decrease sodium should be 0.5 mmol/L/hr  Acute Encephalopathy: Improving from admission. At baseline is Non-verbal but was able to verbalize some stuff today and said "Ok". In the setting of above. Head CT done and showed cerebral atrophy and microvascular disease changes of supratentorial brain with no acute intracranial abnormality noted. C/w With IV fluid hydration and treat urinary tract infection. PT/OT to Evaluate and Treat and she's Ambulating quite well. Delirium  Precautions  Acute Kidney Injury [facility]), improving  Metabolic Acidosis  -Nephrology was consulted by EDP who suggested hydration and to let them know if she is not improving. -BUN/Cr Trend: Recent Labs  Lab 02/02/24 0002 02/02/24 0420 02/02/24 1331 02/02/24 1954 02/03/24 0158 02/03/24 0757 02/03/24 1259  BUN 79* 73* 63* 58* 50* 44* 44*  CREATININE 3.75* 3.43* 3.10* 3.02* 2.51* 2.39* 2.22*  -Has a slight metabolic acidosis with a CO2 of 18, anion gap of 13, chloride level of 125 now -Avoid Nephrotoxic Medications, Contrast Dyes, Hypotension and Dehydration to Ensure Adequate Renal Perfusion and will need to Renally Adjust Meds -C/w IVF as above -Continue to Monitor and Trend Renal Function carefully and repeat CMP in the AM    Type 2 Diabetes Mellitus with Hyperglycemia, with long-term current use of insulin (HCC): HbA1c was 8.1. C/w long-acting insulin with 5 units sq Daily; C/w Sensitive Novolog SSI q4h. Continue to Hold Home Metformin. CBG's ranging from 107-201   E Coli UTI (urinary tract infection): Possibly based on UA as urinalysis showed a hazy appearance with moderate hemoglobin, moderate leukocytes, positive nitrites, many bacteria, 0-5 squamous epithelial cells and 21-50 WBCs with urine culture showing >100,000 CFU of E Coli with Sensitivities pending.  Continue with IV Ceftriaxone 1 g every 24 hours for now and adjust Abx as Necessary  Macrocytic Anemia: Hgb/Hct dropping and likely was hemoconcentrated on admission and now having a dilutional drop. Hgb/Hct is now stable at 10.9/34.7.  Anemia panel done and showed an iron level 96, UIBC of 156, TIBC 252, saturation ratio 38%, ferritin level 288, folate level 10.8,  vitamin B12 562. CTM for S/Sx of Bleeding; No overt bleeding noted. Repeat CBC in the AM  Thrombocytopenia: Plt Count went from 185 -> 169 -> 153 -> 132 -> 136. CTM for S/Sx of Bleeding; No overt Bleeding noted. Repeat CBC in the AM   Dehydration: C/w Aggressive IV  fluid resuscitation as above  HypoMag: Now Mag is 2.1. CTM and Trend and repeat Mag Level in the AM   Hyperlipidemia: Resume Statin with Rosuvastatin 20 mg po daily   Hyperbilirubinemia: Improved. Bilirubin is now 0.8. CTM and Trend and repeat CMP in the AM  Hypoalbuminemia: Patient's Albumin is 3.0 on last check. CTM and Trend and repeat CMP in the AM

## 2024-02-01 NOTE — Progress Notes (Signed)
 Patient a 1:1 feeder. This RN fed lunch to patient. Patient tolerated very well. Ate approx 75% of meal.Able to drink liquids without the straw. . Time for feeding took approx .

## 2024-02-01 NOTE — Evaluation (Signed)
 Clinical/Bedside Swallow Evaluation Patient Details  Name: Mary Swanson MRN: [id number] Date of Birth: 07/21/1956  Today's Date: 02/01/2024 Time: SLP Start Time (ACUTE ONLY): 1110 SLP Stop Time (ACUTE ONLY): 1150 SLP Time Calculation (min) (ACUTE ONLY): 40 min  Past Medical History:  Past Medical History:  Diagnosis Date   ACE-inhibitor cough 03/14/2019   Acute lower UTI 06/16/2021   Acute metabolic encephalopathy 12/28/2019   AKI (acute kidney injury) (HCC) 12/27/2019   Altered mental status 06/15/2021   Dehydration 12/27/2019   Diabetes mellitus (HCC) 12/28/2019   Diabetes mellitus without complication (HCC)    Diabetic foot infection (HCC) 01/06/2022   Hypernatremia 06/16/2021   Hypertension    Memory loss of unknown cause 12/03/2019   Sepsis due to Streptococcus pyogenes due to right foot cellulitis 01/06/2022   Transaminitis 01/06/2022   Past Surgical History:  Past Surgical History:  Procedure Laterality Date   CATARACT EXTRACTION, BILATERAL Bilateral    HPI:  68yo female admitted from [facility] 01/31/24 with AMS. PMH: HTN, HLD, DM2, Alzheimer's dementia, nonverbal status, DNR/DNI. CXR - no active disease. CTHead =  Generalized cerebral atrophy and microvascular disease changes of the supratentorial brain. No acute intracranial abnormality.    Assessment / Plan / Recommendation  Clinical Impression  Pt presents with cognitively based dysphagia, per clinical assessment. Recommend Dys2/thin liquids with crushed meds, 1:1 assist with all PO intake.   Suction was set up at bedside to facilitate thorough oral care. SLP completed oral care, which pt tolerated. Occasional bite reflex on oral swabs/yankauer. Following oral care, pt accepted trials of ice chips, thin liquid, puree, and solid textures. Pt noted to bite on spoon/straw. She was unable to demonstrate ability to use straw at this time. Pt accepted bites of graham cracker given by SLP, but was unable to take a bite of the cracker.  No obvious oral issues or anterior leakage noted. No overt s/s aspiration following presentations. Pt unable to complete [facility] swallow screen due to inability to follow commands.   At this time, recommend beginning with Dysphagia 2 (finely chopped) diet with thin liquids, crushed meds. Pt needs 1:1 assist with all PO intake. Safe swallow precautions posted at [facility]. MD/Nursing informed of results and recommendations. SLP will follow to assess diet tolerance, appropriateness for advanced textures, and continued education.  SLP Visit Diagnosis: Dysphagia, unspecified (R13.10)    Aspiration Risk  Mild aspiration risk;Risk for inadequate nutrition/hydration    Diet Recommendation Dysphagia 2 (Fine chop);Thin liquid    Liquid Administration via: Cup Medication Administration: Crushed with puree Supervision: Staff to assist with self feeding;Full supervision/cueing for compensatory strategies Compensations: Minimize environmental distractions;Slow rate;Small sips/bites Postural Changes: Seated upright at 90 degrees;Remain upright for at least 30 minutes after po intake    Other  Recommendations Oral Care Recommendations: Oral care BID;Staff/trained caregiver to provide oral care Caregiver Recommendations: Have oral suction available    Recommendations for follow up therapy are one component of a multi-disciplinary discharge planning process, led by the attending physician.  Recommendations may be updated based on patient status, additional functional criteria and insurance authorization.  Follow up Recommendations Follow physician's recommendations for discharge plan and follow up therapies      Assistance Recommended at Discharge  24/7  Functional Status Assessment Patient has not had a recent decline in their functional status  Frequency and Duration min 1 x/week  1 week;2 weeks       Prognosis Prognosis for improved oropharyngeal function: Fair Barriers to Reach Goals: Cognitive deficits  Swallow Study   General Date of Onset: 01/31/24 HPI: 68yo female admitted from [facility] 01/31/24 with AMS. PMH: HTN, HLD, DM2, Alzheimer's dementia, nonverbal status, DNR/DNI. CXR - no active disease. CTHead =  Generalized cerebral atrophy and microvascular disease changes of the supratentorial brain. No acute intracranial abnormality. Type of Study: Bedside Swallow Evaluation Previous Swallow Assessment: BSE 01/07/22 - reg/thin Diet Prior to this Study: NPO Temperature Spikes Noted: No Respiratory Status: Room air History of Recent Intubation: No Behavior/Cognition: Alert;Cooperative;Distractible;Requires cueing;Doesn't follow directions Oral Cavity Assessment: Within Functional Limits;Other (comment) (lips dry and crusted) Oral Care Completed by SLP: Yes Oral Cavity - Dentition: Adequate natural dentition Vision: Functional for self-feeding Self-Feeding Abilities: Needs assist Patient Positioning: Upright in bed Baseline Vocal Quality: Aphonic Volitional Cough: Cognitively unable to elicit Volitional Swallow: Unable to elicit    Oral/Motor/Sensory Function Overall Oral Motor/Sensory Function: Other (comment) (unable to assess - pt does not follow commands. No obvious asymmtery or weakness noted.)   Ice Chips Ice chips: Within functional limits Presentation: Spoon   Thin Liquid Thin Liquid: Impaired Presentation: Cup;Spoon Oral Phase Impairments: Other (comment) (unable to demonstrate ability to use straw at this time) Pharyngeal  Phase Impairments: Other (comments) (none)    Nectar Thick Nectar Thick Liquid: Not tested   Honey Thick Honey Thick Liquid: Not tested   Puree Puree: Within functional limits Presentation: Spoon   Solid     Solid: Impaired Oral Phase Impairments: Impaired mastication Oral Phase Functional Implications: Impaired mastication;Prolonged oral transit Pharyngeal Phase Impairments: Other (comments) (none) Other Comments: munching chew pattern.  Unable to take a bite of graham cracker, but tolerated small bites given to her     Merlene Laughter B. Murvin Natal, [facility], CCC-SLP Speech Language Pathologist Office: [phone]  Leigh Aurora 02/01/2024,12:02 PM

## 2024-02-01 NOTE — Progress Notes (Addendum)
 PROGRESS NOTE    Zali Kamaka  WUJ:[id number] DOB: 1956-06-12 DOA: 01/31/2024 PCP: Patient, No Pcp Per   Brief Narrative:  Patient is a 68 year old African-American female with a past medical history significant for essential hypertension, hyperlipidemia, diabetes mellitus type 2, history of Alzheimer's dementia and nonverbal status who resides in a memory care unit.  She is currently DNR/DNI and palliative care follows her in outpatient setting [facility].  Her facility reported of decline over the last 3 weeks and acutely worsened yesterday.  Patient has not had very good oral intake over the last week or so and has not been ambulatory for last few days.  In the ED she was noted to have significant dehydration with an elevated creatinine of 4.92 and severe hypernatremia and elevated bilirubin.  She is awake but not alert and oriented.  SLP evaluated and recommending a dysphagia to thin liquid diet for now.  Given her significant hypernatremia we have started her on D5W and sodium is slowly trending down as well as her AKI.  Assessment and Plan:  Significant Dehydration with associated Hypernatremia: Na+ Trend: Recent Labs  Lab 01/31/24 1809 02/01/24 0752 02/01/24 0944 02/01/24 1234  NA 172* 168* 168* 165*  -Sodium is slowly improving with D5W patient is getting D5 at 125 mL/hr. TSH was 1.361.  Continue to monitor BMPs every 4 hours and try not to overcorrect.  Current rate of fluid should to decrease sodium should be 0.5 mmol/L/hr  Acute Encephalopathy: In the setting of above. Head CT done and showed cerebral atrophy and microvascular disease changes of supratentorial brain with no acute intracranial abnormality noted. With IV fluid hydration and treat urinary tract infection.  Acute Kidney Injury [facility]):  Nephrology was consulted by EDP who suggested hydration and to let them know if she is not improving. -BUN/Cr Trend: Recent Labs  Lab 01/31/24 1809 01/31/24 2349 02/01/24 0752  02/01/24 0944 02/01/24 1234  BUN 102*  --  95* 91* 88*  CREATININE 4.92* 4.78* 4.36* 4.22* 4.08*  -Avoid Nephrotoxic Medications, Contrast Dyes, Hypotension and Dehydration to Ensure Adequate Renal Perfusion and will need to Renally Adjust Meds -Continue to Monitor and Trend Renal Function carefully and repeat CMP in the AM    Type 2 Diabetes Mellitus with Hyperglycemia, with long-term current use of insulin (HCC): HbA1c was 8.1. C/w long-acting insulin with 5 units sq Daily; C/w Sensitive Novolog SSI q4h. Continue to Hold Home Metformin. CBG's ranging from 88-235   UTI (urinary tract infection): Possibly based on UA as urinalysis showed a hazy appearance with moderate hemoglobin, moderate leukocytes, positive nitrites, many bacteria, 0-5 squamous epithelial cells and 21-50 WBCs with urine culture pending.  Continue with IV Ceftriaxone 1 g every 24 hours for now  Macrocytic Anemia: Hgb/Hct dropping and likely was hemoconcentrated on admission and now having a dilutional drop. Hgb/Hct is now 11.0/37.1. Check Anemia Panel in the AM. CTM for S/Sx of Bleeding; No overt bleeding noted. Repeat CBC in the AM   Dehydration: Aggressive IV fluid resuscitation as above   Hyperlipidemia: Holding Statin until more awake and alert  Hyperbilirubinemia: Improved. Bilirubin Trend went from 1.7 -> 1.1. CTM and Trend and repeat CMP in the AM  Hypoalbuminemia: Patient's Albumin Trend ranging from 3.4 -> 3.0. CTM and Trend and repeat CMP in the AM    DVT prophylaxis: heparin injection 5,000 Units Start: 01/31/24 2230    Code Status: Limited: Do not attempt resuscitation (DNR) -DNR-LIMITED -Do Not Intubate/DNI  Family Communication: No family  present at bedside  Disposition Plan:  Level of care: Telemetry Medical Status is: Inpatient Remains inpatient appropriate because: Needs further clinical improvement and clearance with sodium back to her baseline   Consultants:  None  Procedures:  As delineated  as above  Antimicrobials:  Anti-infectives (From admission, onward)    Start     Dose/Rate Route Frequency Ordered Stop   02/01/24 0000  cefTRIAXone (ROCEPHIN) 1 g in sodium chloride 0.9 % 100 mL IVPB        1 g 200 mL/hr over 30 Minutes Intravenous Every 24 hours 01/31/24 2227 02/05/24 2359   01/31/24 1945  cefTRIAXone (ROCEPHIN) 1 g in sodium chloride 0.9 % 100 mL IVPB        1 g 200 mL/hr over 30 Minutes Intravenous  Once 01/31/24 1938 01/31/24 2121       Subjective: Examined at bedside remains lethargic.  Opens her eyes and mumble something but unable to properly understand what she saying.  Continues to remain confused.  Sodium is trending down slowly.  No other concerns or complaints this time.  Objective: Vitals:   01/31/24 2343 02/01/24 0425 02/01/24 0745 02/01/24 1244  BP: (!) 141/63 130/66 (!) 154/68 (!) 140/66  Pulse: 86 78 76 81  Resp: 16 16 16    Temp: 97.6 F (36.4 C) (!) 97.4 F (36.3 C) 98.2 F (36.8 C) 98.7 F (37.1 C)  TempSrc: Axillary Oral  Oral  SpO2: 100% 98% 97% 100%    Intake/Output Summary (Last 24 hours) at 02/01/2024 1507 Last data filed at 02/01/2024 1430 Gross per 24 hour  Intake 1347.4 ml  Output 517 ml  Net 830.4 ml   There were no vitals filed for this visit.  Examination: Physical Exam:  Constitutional: Chronically ill-appearing African-American female who is resting and awakens but is extremely confused Respiratory: Diminished to auscultation bilaterally, no wheezing, rales, rhonchi or crackles. Normal respiratory effort and patient is not tachypenic. No accessory muscle use.  Unlabored breathing Cardiovascular: RRR, no murmurs / rubs / gallops. S1 and S2 auscultated. No extremity edema.  Abdomen: Soft, non-tender, non-distended. Bowel sounds positive.  GU: Deferred. Musculoskeletal: No clubbing / cyanosis of digits/nails. No joint deformity upper and lower extremities.  Skin: No rashes, lesions, ulcers on the skin evaluation. No  induration; Warm and dry.  Neurologic: Awakens to physical verbal stimuli but does not follow commands  Psychiatric: Impaired judgment and insight  Data Reviewed: I have personally reviewed following labs and imaging studies  CBC: Recent Labs  Lab 01/31/24 1809 01/31/24 2349 02/01/24 0752  WBC 10.2 9.0 7.5  HGB 12.8 11.7* 11.0*  HCT 43.5 39.1 37.1  MCV 105.1* 104.0* 104.2*  PLT 185 169 153   Basic Metabolic Panel: Recent Labs  Lab 01/31/24 1809 01/31/24 2349 02/01/24 0752 02/01/24 0944 02/01/24 1234  NA 172*  --  168* 168* 165*  K 5.0  --  4.2 4.1 3.9  CL >130*  --  >130* >130* >130*  CO2 24  --  22 20* 22  GLUCOSE 167*  --  233* 239* 259*  BUN 102*  --  95* 91* 88*  CREATININE 4.92* 4.78* 4.36* 4.22* 4.08*  CALCIUM 10.2  --  9.2 9.0 9.0   GFR: CrCl cannot be calculated (Unknown ideal weight.). Liver Function Tests: Recent Labs  Lab 01/31/24 1809 02/01/24 0752  AST 15 14*  ALT 17 14  ALKPHOS 59 51  BILITOT 1.7* 1.1  PROT 7.3 6.3*  ALBUMIN 3.4* 3.0*   No  results for input(s): "LIPASE", "AMYLASE" in the last 168 hours. Recent Labs  Lab 01/31/24 1809  AMMONIA 19   Coagulation Profile: No results for input(s): "INR", "PROTIME" in the last 168 hours. Cardiac Enzymes: No results for input(s): "CKTOTAL", "CKMB", "CKMBINDEX", "TROPONINI" in the last 168 hours. BNP (last 3 results) No results for input(s): "PROBNP" in the last 8760 hours. HbA1C: Recent Labs    01/31/24 2349  HGBA1C 8.1*   CBG: Recent Labs  Lab 01/31/24 1644 01/31/24 2339 02/01/24 0507 02/01/24 0744 02/01/24 1156  GLUCAP 151* 88 172* 206* 235*   Lipid Profile: No results for input(s): "CHOL", "HDL", "LDLCALC", "TRIG", "CHOLHDL", "LDLDIRECT" in the last 72 hours. Thyroid Function Tests: Recent Labs    01/31/24 2349  TSH 1.361   Anemia Panel: No results for input(s): "VITAMINB12", "FOLATE", "FERRITIN", "TIBC", "IRON", "RETICCTPCT" in the last 72 hours. Sepsis Labs: No  results for input(s): "PROCALCITON", "LATICACIDVEN" in the last 168 hours.  Recent Results (from the past 240 hours)  Resp panel by RT-PCR (RSV, Flu A&B, Covid) Anterior Nasal Swab     Status: None   Collection Time: 01/31/24  7:47 PM   Specimen: Anterior Nasal Swab  Result Value Ref Range Status   SARS Coronavirus 2 by RT PCR NEGATIVE NEGATIVE Final   Influenza A by PCR NEGATIVE NEGATIVE Final   Influenza B by PCR NEGATIVE NEGATIVE Final    Comment: (NOTE) The Xpert Xpress SARS-CoV-2/FLU/RSV plus assay is intended as an aid in the diagnosis of influenza from Nasopharyngeal swab specimens and should not be used as a sole basis for treatment. Nasal washings and aspirates are unacceptable for Xpert Xpress SARS-CoV-2/FLU/RSV testing.  Fact Sheet for Patients: BloggerCourse.com  Fact Sheet for Healthcare Providers: SeriousBroker.it  This test is not yet approved or cleared by the [place] FDA and has been authorized for detection and/or diagnosis of SARS-CoV-2 by FDA under an Emergency Use Authorization (EUA). This EUA will remain in effect (meaning this test can be used) for the duration of the COVID-19 declaration under Section 564(b)(1) of the Act, 21 U.S.C. section 360bbb-3(b)(1), unless the authorization is terminated or revoked.     Resp Syncytial Virus by PCR NEGATIVE NEGATIVE Final    Comment: (NOTE) Fact Sheet for Patients: BloggerCourse.com  Fact Sheet for Healthcare Providers: SeriousBroker.it  This test is not yet approved or cleared by the [place] FDA and has been authorized for detection and/or diagnosis of SARS-CoV-2 by FDA under an Emergency Use Authorization (EUA). This EUA will remain in effect (meaning this test can be used) for the duration of the COVID-19 declaration under Section 564(b)(1) of the Act, 21 U.S.C. section 360bbb-3(b)(1), unless the  authorization is terminated or revoked.  Performed at [facility], [address] [place], [place] [address]     Radiology Studies: CT Head Wo Contrast Result Date: 01/31/2024 CLINICAL DATA:  Altered mental status. EXAM: CT HEAD WITHOUT CONTRAST TECHNIQUE: Contiguous axial images were obtained from the base of the skull through the vertex without intravenous contrast. RADIATION DOSE REDUCTION: This exam was performed according to the departmental dose-optimization program which includes automated exposure control, adjustment of the mA and/or kV according to patient size and/or use of iterative reconstruction technique. COMPARISON:  December 22, 2023 FINDINGS: Brain: There is generalized cerebral atrophy with widening of the extra-axial spaces and stable ventricular dilatation. There are areas of decreased attenuation within the white matter tracts of the supratentorial brain, consistent with microvascular disease changes. Vascular: No hyperdense vessel  or unexpected calcification. Skull: Normal. Negative for fracture or focal lesion. Sinuses/Orbits: No acute finding. Other: None. IMPRESSION: 1. Generalized cerebral atrophy and microvascular disease changes of the supratentorial brain. 2. No acute intracranial abnormality. Electronically Signed   By: Aram Candela M.D.   On: 01/31/2024 19:48   DG Chest Port 1 View Result Date: 01/31/2024 CLINICAL DATA:  Altered mental status and failure to thrive. EXAM: PORTABLE CHEST 1 VIEW COMPARISON:  December 11, 2023 FINDINGS: The heart size and mediastinal contours are within normal limits. Both lungs are clear. Calcified bilateral breast implants are seen. The visualized skeletal structures are unremarkable. IMPRESSION: No active cardiopulmonary disease. Electronically Signed   By: Aram Candela M.D.   On: 01/31/2024 19:45   Scheduled Meds:  heparin  5,000 Units Subcutaneous Q8H   insulin aspart  0-9 Units Subcutaneous Q4H   insulin glargine  5  Units Subcutaneous QHS   Continuous Infusions:  cefTRIAXone (ROCEPHIN)  IV 1 g (01/31/24 2356)   dextrose 125 mL/hr at 02/01/24 1503    LOS: 1 day   Marguerita Merles, DO [facility] Available via Epic secure chat 7am-7pm After these hours, please refer to coverage provider listed on [email or web address] 02/01/2024, 3:07 PM

## 2024-02-01 NOTE — Plan of Care (Signed)

## 2024-02-01 NOTE — Progress Notes (Addendum)
   01/31/24 2343  Vitals  Temp 97.6 F (36.4 C)  Temp Source Axillary  BP (!) 141/63  MAP (mmHg) 87  BP Location Right Arm  BP Method Automatic  Patient Position (if appropriate) Lying  Pulse Rate 86  Pulse Rate Source Monitor  Resp 16  Level of Consciousness  Level of Consciousness Responds to Voice  MEWS COLOR  MEWS Score Color Green  Oxygen Therapy  SpO2 100 %  O2 Device Room Air  Pain Assessment  Pain Scale PAINAD  Pain Score 0  PAINAD (Pain Assessment in Advanced Dementia)  Breathing 0  Negative Vocalization 0  Facial Expression 0  Body Language 0  Consolability 0  PAINAD Score 0  MEWS Score  MEWS Temp 0  MEWS Systolic 0  MEWS Pulse 0  MEWS RR 0  MEWS LOC 1  MEWS Score 1   Pt came from ED with NS @ 150 ml/hr infusing. Pt is non-verbal at baseline, responds to voice. VSS, on room air. CBG is 88. Pt is NPO. Dr. Gery Pray notified and new order for D5 @ 162ml/hr received.   2 RN skin assessment done with [place] Ponder, Consulting civil engineer.  Unable to complete admission questions due to pt mental status. No family member present.  Bed alarm on. Safety maintained. Will continue to monitor.

## 2024-02-02 DIAGNOSIS — E871 Hypo-osmolality and hyponatremia: Secondary | ICD-10-CM

## 2024-02-02 DIAGNOSIS — E785 Hyperlipidemia, unspecified: Secondary | ICD-10-CM | POA: Diagnosis not present

## 2024-02-02 DIAGNOSIS — E860 Dehydration: Secondary | ICD-10-CM | POA: Diagnosis not present

## 2024-02-02 DIAGNOSIS — N179 Acute kidney failure, unspecified: Secondary | ICD-10-CM | POA: Diagnosis not present

## 2024-02-02 DIAGNOSIS — E1165 Type 2 diabetes mellitus with hyperglycemia: Secondary | ICD-10-CM | POA: Diagnosis not present

## 2024-02-02 LAB — BASIC METABOLIC PANEL
Anion gap: 13 (ref 5–15)
Anion gap: 15 (ref 5–15)
Anion gap: 9 (ref 5–15)
BUN: 58 mg/dL — ABNORMAL HIGH (ref 8–23)
BUN: 63 mg/dL — ABNORMAL HIGH (ref 8–23)
BUN: 79 mg/dL — ABNORMAL HIGH (ref 8–23)
CO2: 17 mmol/L — ABNORMAL LOW (ref 22–32)
CO2: 19 mmol/L — ABNORMAL LOW (ref 22–32)
CO2: 23 mmol/L (ref 22–32)
Calcium: 8.8 mg/dL — ABNORMAL LOW (ref 8.9–10.3)
Calcium: 8.9 mg/dL (ref 8.9–10.3)
Calcium: 9.3 mg/dL (ref 8.9–10.3)
Chloride: 120 mmol/L — ABNORMAL HIGH (ref 98–111)
Chloride: 120 mmol/L — ABNORMAL HIGH (ref 98–111)
Chloride: 129 mmol/L — ABNORMAL HIGH (ref 98–111)
Creatinine, Ser: 3.02 mg/dL — ABNORMAL HIGH (ref 0.44–1.00)
Creatinine, Ser: 3.1 mg/dL — ABNORMAL HIGH (ref 0.44–1.00)
Creatinine, Ser: 3.75 mg/dL — ABNORMAL HIGH (ref 0.44–1.00)
GFR, Estimated: 12 mL/min — ABNORMAL LOW (ref >60)
GFR, Estimated: 16 mL/min — ABNORMAL LOW (ref >60)
GFR, Estimated: 16 mL/min — ABNORMAL LOW (ref >60)
Glucose, Bld: 189 mg/dL — ABNORMAL HIGH (ref 70–99)
Glucose, Bld: 266 mg/dL — ABNORMAL HIGH (ref 70–99)
Glucose, Bld: 351 mg/dL — ABNORMAL HIGH (ref 70–99)
Potassium: 3.8 mmol/L (ref 3.5–5.1)
Potassium: 4.6 mmol/L (ref 3.5–5.1)
Potassium: 5 mmol/L (ref 3.5–5.1)
Sodium: 152 mmol/L — ABNORMAL HIGH (ref 135–145)
Sodium: 152 mmol/L — ABNORMAL HIGH (ref 135–145)
Sodium: 161 mmol/L (ref 135–145)

## 2024-02-02 LAB — GLUCOSE, CAPILLARY
Glucose-Capillary: 169 mg/dL — ABNORMAL HIGH (ref 70–99)
Glucose-Capillary: 170 mg/dL — ABNORMAL HIGH (ref 70–99)
Glucose-Capillary: 213 mg/dL — ABNORMAL HIGH (ref 70–99)
Glucose-Capillary: 221 mg/dL — ABNORMAL HIGH (ref 70–99)
Glucose-Capillary: 256 mg/dL — ABNORMAL HIGH (ref 70–99)
Glucose-Capillary: 306 mg/dL — ABNORMAL HIGH (ref 70–99)
Glucose-Capillary: 405 mg/dL — ABNORMAL HIGH (ref 70–99)

## 2024-02-02 LAB — CBC WITH DIFFERENTIAL/PLATELET
Abs Immature Granulocytes: 0.06 10*3/uL (ref 0.00–0.07)
Basophils Absolute: 0 10*3/uL (ref 0.0–0.1)
Basophils Relative: 0 %
Eosinophils Absolute: 0.3 10*3/uL (ref 0.0–0.5)
Eosinophils Relative: 4 %
HCT: 37.1 % (ref 36.0–46.0)
Hemoglobin: 11 g/dL — ABNORMAL LOW (ref 12.0–15.0)
Immature Granulocytes: 1 %
Lymphocytes Relative: 31 %
Lymphs Abs: 2.8 10*3/uL (ref 0.7–4.0)
MCH: 31.2 pg (ref 26.0–34.0)
MCHC: 29.6 g/dL — ABNORMAL LOW (ref 30.0–36.0)
MCV: 105.1 fL — ABNORMAL HIGH (ref 80.0–100.0)
Monocytes Absolute: 0.5 10*3/uL (ref 0.1–1.0)
Monocytes Relative: 6 %
Neutro Abs: 5.3 10*3/uL (ref 1.7–7.7)
Neutrophils Relative %: 58 %
Platelets: 132 10*3/uL — ABNORMAL LOW (ref 150–400)
RBC: 3.53 MIL/uL — ABNORMAL LOW (ref 3.87–5.11)
RDW: 12.7 % (ref 11.5–15.5)
WBC: 9 10*3/uL (ref 4.0–10.5)
nRBC: 0 % (ref 0.0–0.2)

## 2024-02-02 LAB — COMPREHENSIVE METABOLIC PANEL
ALT: 15 U/L (ref 0–44)
AST: 18 U/L (ref 15–41)
Albumin: 2.9 g/dL — ABNORMAL LOW (ref 3.5–5.0)
Alkaline Phosphatase: 49 U/L (ref 38–126)
Anion gap: 11 (ref 5–15)
BUN: 73 mg/dL — ABNORMAL HIGH (ref 8–23)
CO2: 22 mmol/L (ref 22–32)
Calcium: 8.7 mg/dL — ABNORMAL LOW (ref 8.9–10.3)
Chloride: 128 mmol/L — ABNORMAL HIGH (ref 98–111)
Creatinine, Ser: 3.43 mg/dL — ABNORMAL HIGH (ref 0.44–1.00)
GFR, Estimated: 14 mL/min — ABNORMAL LOW (ref >60)
Glucose, Bld: 193 mg/dL — ABNORMAL HIGH (ref 70–99)
Potassium: 3.6 mmol/L (ref 3.5–5.1)
Sodium: 161 mmol/L (ref 135–145)
Total Bilirubin: 0.7 mg/dL (ref 0.0–1.2)
Total Protein: 6.2 g/dL — ABNORMAL LOW (ref 6.5–8.1)

## 2024-02-02 LAB — MAGNESIUM: Magnesium: 1.6 mg/dL — ABNORMAL LOW (ref 1.7–2.4)

## 2024-02-02 LAB — PHOSPHORUS: Phosphorus: 3.3 mg/dL (ref 2.5–4.6)

## 2024-02-02 MED ORDER — FERROUS SULFATE 220 (44 FE) MG/5ML PO SOLN
330.0000 mg | Freq: Every day | ORAL | Status: DC
  Administered 2024-02-02 – 2024-02-03 (×2): 330 mg via ORAL
  Filled 2024-02-02 (×3): qty 7.5

## 2024-02-02 MED ORDER — GLUCERNA SHAKE PO LIQD
237.0000 mL | Freq: Two times a day (BID) | ORAL | Status: DC
  Administered 2024-02-02 – 2024-02-08 (×10): 237 mL via ORAL

## 2024-02-02 MED ORDER — INSULIN ASPART 100 UNIT/ML IJ SOLN
0.0000 [IU] | Freq: Every day | INTRAMUSCULAR | Status: DC
  Administered 2024-02-02: 2 [IU] via SUBCUTANEOUS
  Administered 2024-02-03 – 2024-02-04 (×2): 3 [IU] via SUBCUTANEOUS

## 2024-02-02 MED ORDER — ENSURE ENLIVE PO LIQD
237.0000 mL | Freq: Two times a day (BID) | ORAL | Status: DC
  Administered 2024-02-03 (×2): 237 mL via ORAL

## 2024-02-02 MED ORDER — DEXTROSE 5 % IV SOLN: INTRAVENOUS | Status: DC

## 2024-02-02 MED ORDER — INSULIN ASPART 100 UNIT/ML IJ SOLN
7.0000 [IU] | Freq: Once | INTRAMUSCULAR | Status: AC
  Administered 2024-02-02: 7 [IU] via SUBCUTANEOUS

## 2024-02-02 MED ORDER — ROSUVASTATIN CALCIUM 20 MG PO TABS
20.0000 mg | ORAL_TABLET | Freq: Every day | ORAL | Status: DC
  Administered 2024-02-02 – 2024-02-07 (×6): 20 mg via ORAL
  Filled 2024-02-02 (×6): qty 1

## 2024-02-02 MED ORDER — INSULIN ASPART 100 UNIT/ML IJ SOLN
0.0000 [IU] | Freq: Three times a day (TID) | INTRAMUSCULAR | Status: DC
  Administered 2024-02-03: 8 [IU] via SUBCUTANEOUS
  Administered 2024-02-03: 5 [IU] via SUBCUTANEOUS
  Administered 2024-02-03: 2 [IU] via SUBCUTANEOUS
  Administered 2024-02-04: 8 [IU] via SUBCUTANEOUS
  Administered 2024-02-04: 5 [IU] via SUBCUTANEOUS
  Administered 2024-02-04: 3 [IU] via SUBCUTANEOUS
  Administered 2024-02-05 (×2): 11 [IU] via SUBCUTANEOUS
  Administered 2024-02-06: 5 [IU] via SUBCUTANEOUS
  Administered 2024-02-06: 3 [IU] via SUBCUTANEOUS
  Administered 2024-02-06: 11 [IU] via SUBCUTANEOUS
  Administered 2024-02-07: 8 [IU] via SUBCUTANEOUS
  Administered 2024-02-07: 3 [IU] via SUBCUTANEOUS
  Administered 2024-02-07: 11 [IU] via SUBCUTANEOUS
  Administered 2024-02-08: 5 [IU] via SUBCUTANEOUS
  Administered 2024-02-08: 11 [IU] via SUBCUTANEOUS

## 2024-02-02 MED ORDER — ADULT MULTIVITAMIN W/MINERALS CH
1.0000 | ORAL_TABLET | Freq: Every day | ORAL | Status: DC
  Administered 2024-02-02 – 2024-02-08 (×7): 1 via ORAL
  Filled 2024-02-02 (×7): qty 1

## 2024-02-02 MED ORDER — MAGNESIUM SULFATE 2 GM/50ML IV SOLN
2.0000 g | Freq: Once | INTRAVENOUS | Status: AC
  Administered 2024-02-02: 2 g via INTRAVENOUS
  Filled 2024-02-02: qty 50

## 2024-02-02 NOTE — Evaluation (Addendum)
 Physical Therapy Evaluation/ Discharge Patient Details Name: Mary Swanson MRN: [id number] DOB: 19-Jan-1956 Today's Date: 02/02/2024  History of Present Illness  68 yo admitted 3/6 with AMS, CT head with generalized atrophy. PMH: HTN, HLD, DM2, Alzheimer's dementia, nonverbal status  Clinical Impression  Pt mostly nonverbal but would intermittently state "yeah", "ok". Family present to provide PLOF and reports Mary Swanson has had recent falls but does not use AD and tends to wander at facility. Mary Swanson was able to walk in hall with bil HHA and assist for IV pole. She is easily distracted and needs physical redirection but is functioning at baseline level. Return to long term care is appropriate and will defer to mobility specialists acutely with recommendation for OOB daily.         If plan is discharge home, recommend the following: A little help with walking and/or transfers;A lot of help with bathing/dressing/bathroom;Assistance with feeding;Assistance with cooking/housework;Assist for transportation;Supervision due to cognitive status   Can travel by private vehicle   Yes    Equipment Recommendations None recommended by PT  Recommendations for Other Services       Functional Status Assessment Patient has not had a recent decline in their functional status     Precautions / Restrictions Precautions Precautions: Fall      Mobility  Bed Mobility Overal bed mobility: Needs Assistance Bed Mobility: Supine to Sit     Supine to sit: Min assist     General bed mobility comments: min assist to bring legs off of bed with gestural and tactile cues to elevate trunk    Transfers Overall transfer level: Needs assistance   Transfers: Sit to/from Stand Sit to Stand: Min assist           General transfer comment: min assist to rise from bed, lower and rise from [facility] and sit to chair. Pt requires tactile and gestural cues to initiate and redirect as pt will reach out for all items nearby as  they strike her attention    Ambulation/Gait Ambulation/Gait assistance: Min assist, +2 safety/equipment Gait Distance (Feet): 200 Feet Assistive device: 2 person hand held assist Gait Pattern/deviations: Step-through pattern, Decreased stride length   Gait velocity interpretation: 1.31 - 2.62 ft/sec, indicative of limited community ambulator   General Gait Details: pt with bil hand held assist +2 for safety with presence with IV pole, directional cues and assist, steady gait initially with increased left lean with progression needing increased physical assist for balance  Stairs            Wheelchair Mobility     Tilt Bed    Modified Rankin (Stroke Patients Only)       Balance Overall balance assessment: Needs assistance Sitting-balance support: No upper extremity supported Sitting balance-Leahy Scale: Fair     Standing balance support: Bilateral upper extremity supported Standing balance-Leahy Scale: Poor                               Pertinent Vitals/Pain Pain Assessment Pain Assessment: PAINAD Breathing: normal Negative Vocalization: none Facial Expression: smiling or inexpressive Body Language: relaxed Consolability: no need to console PAINAD Score: 0    Home Living Family/patient expects to be discharged to:: Skilled nursing facility                        Prior Function Prior Level of Function : Needs assist       Physical  Assist : Mobility (physical);ADLs (physical) Mobility (physical): Transfers ADLs (physical): Toileting;Dressing;Bathing;Grooming;IADLs Mobility Comments: assist to initiate moving at times, others impulsive and per family does not use AD, recent falls ADLs Comments: staff assists with aDLs and IADLs normally she wears adult briefs     Extremity/Trunk Assessment   Upper Extremity Assessment Upper Extremity Assessment: Generalized weakness    Lower Extremity Assessment Lower Extremity Assessment:  Generalized weakness    Cervical / Trunk Assessment Cervical / Trunk Assessment: Normal  Communication   Communication Communication: Impaired Factors Affecting Communication: Difficulty expressing self    Cognition Arousal: Alert Behavior During Therapy: Flat affect   PT - Cognitive impairments: History of cognitive impairments                         Following commands: Impaired Following commands impaired: Follows one step commands inconsistently     Cueing Cueing Techniques: Gestural cues, Tactile cues, Verbal cues     General Comments      Exercises     Assessment/Plan    PT Assessment Patient does not need any further PT services  PT Problem List Decreased strength;Decreased coordination;Decreased activity tolerance;Decreased balance;Decreased mobility       PT Treatment Interventions      PT Goals (Current goals can be found in the Care Plan section)  Acute Rehab PT Goals PT Goal Formulation: All assessment and education complete, DC therapy    Frequency       Co-evaluation               AM-PAC PT "6 Clicks" Mobility  Outcome Measure Help needed turning from your back to your side while in a flat bed without using bedrails?: A Little Help needed moving from lying on your back to sitting on the side of a flat bed without using bedrails?: A Little Help needed moving to and from a bed to a chair (including a wheelchair)?: A Little Help needed standing up from a chair using your arms (e.g., wheelchair or bedside chair)?: A Little Help needed to walk in hospital room?: A Little Help needed climbing 3-5 steps with a railing? : Total 6 Click Score: 16    End of Session Equipment Utilized During Treatment: Gait belt Activity Tolerance: Patient tolerated treatment well Patient left: in chair;with call bell/phone within reach;with chair alarm set Nurse Communication: Mobility status PT Visit Diagnosis: Other abnormalities of gait and  mobility (R26.89);Difficulty in walking, not elsewhere classified (R26.2);History of falling (Z91.81)    Time: 1049-1110 PT Time Calculation (min) (ACUTE ONLY): 21 min   Charges:   PT Evaluation $PT Eval Low Complexity: 1 Low   PT General Charges $$ ACUTE PT VISIT: 1 Visit         Merryl Hacker, PT Acute Rehabilitation Services Office: [phone]   Enedina Finner Samanthajo Payano 02/02/2024, 1:26 PM

## 2024-02-02 NOTE — Plan of Care (Signed)

## 2024-02-02 NOTE — Progress Notes (Signed)
 PROGRESS NOTE    Mary Swanson  MWU:[id number] DOB: 1956/07/01 DOA: 01/31/2024 PCP: Patient, No Pcp Per   Brief Narrative:  Patient is a 68 year old African-American female with a past medical history significant for essential hypertension, hyperlipidemia, diabetes mellitus type 2, history of Alzheimer's dementia and nonverbal status who resides in a memory care unit.  She is currently DNR/DNI and palliative care follows her in outpatient setting [facility].  Her facility reported of decline over the last 3 weeks and acutely worsened yesterday.  Patient has not had very good oral intake over the last week or so and has not been ambulatory for last few days.  In the ED she was noted to have significant dehydration with an elevated creatinine of 4.92 and severe hypernatremia and elevated bilirubin.  She is awake but not alert and oriented.  SLP evaluated and recommending a dysphagia 2 thin liquid diet for now.  Given her significant hyponatremia we have started her on D5W and sodium is slowly trending down as well as her AKI. Further w/u also reveals a GNR UTI for which she is on IV Ceftriaxone.   Assessment and Plan:  Significant Dehydration with associated Hypernatremia: Improving, Na+ Trend: Recent Labs  Lab 02/01/24 0752 02/01/24 0944 02/01/24 1234 02/01/24 1616 02/01/24 2023 02/02/24 0002 02/02/24 0420  NA 168* 168* 165* 164* 163* 161* 161*  -Sodium is slowly improving with D5W; patient is getting D5 @ 125 mL/hr. TSH was 1.361.  Continue to monitor BMPs every 4 hours and try not to overcorrect.  Current rate of fluid should to decrease sodium should be 0.5 mmol/L/hr  Acute Encephalopathy: At baseline is Non-verbal. In the setting of above. Head CT done and showed cerebral atrophy and microvascular disease changes of supratentorial brain with no acute intracranial abnormality noted. C/w With IV fluid hydration and treat urinary tract infection. PT/OT to Evaluate and Treat  Acute  Kidney Injury [facility]), improving  Nephrology was consulted by EDP who suggested hydration and to let them know if she is not improving. -BUN/Cr Trend: Recent Labs  Lab 02/01/24 0752 02/01/24 0944 02/01/24 1234 02/01/24 1616 02/01/24 2023 02/02/24 0002 02/02/24 0420  BUN 95* 91* 88* 84* 81* 79* 73*  CREATININE 4.36* 4.22* 4.08* 3.95* 3.78* 3.75* 3.43*  -Avoid Nephrotoxic Medications, Contrast Dyes, Hypotension and Dehydration to Ensure Adequate Renal Perfusion and will need to Renally Adjust Meds -C/w IVF as above -Continue to Monitor and Trend Renal Function carefully and repeat CMP in the AM    Type 2 Diabetes Mellitus with Hyperglycemia, with long-term current use of insulin (HCC): HbA1c was 8.1. C/w long-acting insulin with 5 units sq Daily; C/w Sensitive Novolog SSI q4h. Continue to Hold Home Metformin. CBG's ranging from 88-235   GNR UTI (urinary tract infection): Possibly based on UA as urinalysis showed a hazy appearance with moderate hemoglobin, moderate leukocytes, positive nitrites, many bacteria, 0-5 squamous epithelial cells and 21-50 WBCs with urine culture showing >100,000 CFU of GNR.  Continue with IV Ceftriaxone 1 g every 24 hours for now and adjust Abx as Necessary  Macrocytic Anemia: Hgb/Hct dropping and likely was hemoconcentrated on admission and now having a dilutional drop. Hgb/Hct is now stable at 11.0/37.1. Check Anemia Panel in the AM. CTM for S/Sx of Bleeding; No overt bleeding noted. Repeat CBC in the AM  Thrombocytopenia: Plt Count went from 185 -> 169 -> 153 -> 132. CTM for S/Sx of Bleeding; No overt Bleeding noted. Repeat CBC in the AM   Dehydration: C/w  Aggressive IV fluid resuscitation as above  HypoMag: Now Mag is 1.6. Replete with IV Mag Sulfate 2 grams. CTM and Trend and repeat Mag Level in the AM   Hyperlipidemia: Resume Statin with Rosuvastatin 20 mg po daily   Hyperbilirubinemia: Improved. Bilirubin Trend went from 1.7 -> 1.1 -> 0.7. CTM and Trend  and repeat CMP in the AM  Hypoalbuminemia: Patient's Albumin Trend ranging from 3.4 -> 3.0 -.> 2.9. CTM and Trend and repeat CMP in the AM    DVT prophylaxis: heparin injection 5,000 Units Start: 01/31/24 2230    Code Status: Limited: Do not attempt resuscitation (DNR) -DNR-LIMITED -Do Not Intubate/DNI  Family Communication: Discussed with family present at bedside  Disposition Plan:  Level of care: Telemetry Medical Status is: Inpatient Remains inpatient appropriate because: Back to ALF likely when improved.   Consultants:  None  Procedures:  As delineated as above  Antimicrobials:  Anti-infectives (From admission, onward)    Start     Dose/Rate Route Frequency Ordered Stop   02/01/24 0000  cefTRIAXone (ROCEPHIN) 1 g in sodium chloride 0.9 % 100 mL IVPB        1 g 200 mL/hr over 30 Minutes Intravenous Every 24 hours 01/31/24 2227 02/05/24 2359   01/31/24 1945  cefTRIAXone (ROCEPHIN) 1 g in sodium chloride 0.9 % 100 mL IVPB        1 g 200 mL/hr over 30 Minutes Intravenous  Once 01/31/24 1938 01/31/24 2121       Subjective: Seen and examined at bedside and per family she has been eating okay today.  More awake but still nonverbal.  Unable to communicate given her condition.  Family thinks she is doing little bit better today though. No other concerns or complaints at this time.  Objective: Vitals:   02/01/24 1915 02/02/24 0026 02/02/24 0414 02/02/24 0844  BP: 129/65 121/87 (!) 142/73 119/70  Pulse: 72 72 64 72  Resp: 18 16 16 16   Temp: 98.2 F (36.8 C) 98 F (36.7 C) 98.2 F (36.8 C) 97.6 F (36.4 C)  TempSrc: Oral Oral Oral Oral  SpO2: 99% 98% 100% 100%    Intake/Output Summary (Last 24 hours) at 02/02/2024 1309 Last data filed at 02/02/2024 1258 Gross per 24 hour  Intake 2412.82 ml  Output 1950 ml  Net 462.82 ml   There were no vitals filed for this visit.  Examination: Physical Exam:  Constitutional: Chronically ill-appearing African-American female who is  more awake but continues to remain nonverbal Respiratory: Diminished to auscultation bilaterally, no wheezing, rales, rhonchi or crackles. Normal respiratory effort and patient is not tachypenic. No accessory muscle use.  Unlabored breathing Cardiovascular: RRR, no murmurs / rubs / gallops. S1 and S2 auscultated. No extremity edema.  Abdomen: Soft, non-tender, non-distended. Bowel sounds positive.  GU: Deferred. Musculoskeletal: No clubbing / cyanosis of digits/nails. No joint deformity upper and lower extremities.  Skin: No rashes, lesions, ulcers on limited skin evaluation. No induration; Warm and dry.  Neurologic: She is more awake and alert but remains nonverbal.  Does not really follow commands. Psychiatric: Appears calm  Data Reviewed: I have personally reviewed following labs and imaging studies  CBC: Recent Labs  Lab 01/31/24 1809 01/31/24 2349 02/01/24 0752 02/02/24 0154  WBC 10.2 9.0 7.5 9.0  NEUTROABS  --   --   --  5.3  HGB 12.8 11.7* 11.0* 11.0*  HCT 43.5 39.1 37.1 37.1  MCV 105.1* 104.0* 104.2* 105.1*  PLT 185 169 153 132*  Basic Metabolic Panel: Recent Labs  Lab 02/01/24 1234 02/01/24 1616 02/01/24 2023 02/02/24 0002 02/02/24 0420  NA 165* 164* 163* 161* 161*  K 3.9 4.0 4.5 3.8 3.6  CL >130* >130* 128* 129* 128*  CO2 22 20* 23 23 22   GLUCOSE 259* 374* 369* 189* 193*  BUN 88* 84* 81* 79* 73*  CREATININE 4.08* 3.95* 3.78* 3.75* 3.43*  CALCIUM 9.0 9.1 9.1 9.3 8.7*  MG  --   --   --   --  1.6*  PHOS  --   --   --   --  3.3   GFR: CrCl cannot be calculated (Unknown ideal weight.). Liver Function Tests: Recent Labs  Lab 01/31/24 1809 02/01/24 0752 02/02/24 0420  AST 15 14* 18  ALT 17 14 15   ALKPHOS 59 51 49  BILITOT 1.7* 1.1 0.7  PROT 7.3 6.3* 6.2*  ALBUMIN 3.4* 3.0* 2.9*   No results for input(s): "LIPASE", "AMYLASE" in the last 168 hours. Recent Labs  Lab 01/31/24 1809  AMMONIA 19   Coagulation Profile: No results for input(s): "INR",  "PROTIME" in the last 168 hours. Cardiac Enzymes: No results for input(s): "CKTOTAL", "CKMB", "CKMBINDEX", "TROPONINI" in the last 168 hours. BNP (last 3 results) No results for input(s): "PROBNP" in the last 8760 hours. HbA1C: Recent Labs    01/31/24 2349  HGBA1C 8.1*   CBG: Recent Labs  Lab 02/01/24 1919 02/02/24 0023 02/02/24 0420 02/02/24 0849 02/02/24 1151  GLUCAP 344* 169* 170* 213* 306*   Lipid Profile: No results for input(s): "CHOL", "HDL", "LDLCALC", "TRIG", "CHOLHDL", "LDLDIRECT" in the last 72 hours. Thyroid Function Tests: Recent Labs    01/31/24 2349  TSH 1.361   Anemia Panel: No results for input(s): "VITAMINB12", "FOLATE", "FERRITIN", "TIBC", "IRON", "RETICCTPCT" in the last 72 hours. Sepsis Labs: No results for input(s): "PROCALCITON", "LATICACIDVEN" in the last 168 hours.  Recent Results (from the past 240 hours)  Urine Culture     Status: Abnormal (Preliminary result)   Collection Time: 01/31/24  5:42 PM   Specimen: Urine, Random  Result Value Ref Range Status   Specimen Description URINE, RANDOM  Final   Special Requests NONE Reflexed from [id number]  Final   Culture (A)  Final    >=100,000 COLONIES/mL GRAM NEGATIVE RODS IDENTIFICATION TO FOLLOW Performed at [facility], [address], [place], [place] [address]    Report Status PENDING  Incomplete  Resp panel by RT-PCR (RSV, Flu A&B, Covid) Anterior Nasal Swab     Status: None   Collection Time: 01/31/24  7:47 PM   Specimen: Anterior Nasal Swab  Result Value Ref Range Status   SARS Coronavirus 2 by RT PCR NEGATIVE NEGATIVE Final   Influenza A by PCR NEGATIVE NEGATIVE Final   Influenza B by PCR NEGATIVE NEGATIVE Final    Comment: (NOTE) The Xpert Xpress SARS-CoV-2/FLU/RSV plus assay is intended as an aid in the diagnosis of influenza from Nasopharyngeal swab specimens and should not be used as a sole basis for treatment. Nasal washings and aspirates are unacceptable for Xpert Xpress  SARS-CoV-2/FLU/RSV testing.  Fact Sheet for Patients: BloggerCourse.com  Fact Sheet for Healthcare Providers: SeriousBroker.it  This test is not yet approved or cleared by the [place] FDA and has been authorized for detection and/or diagnosis of SARS-CoV-2 by FDA under an Emergency Use Authorization (EUA). This EUA will remain in effect (meaning this test can be used) for the duration of the COVID-19 declaration under Section 564(b)(1) of the  Act, 21 U.S.C. section 360bbb-3(b)(1), unless the authorization is terminated or revoked.     Resp Syncytial Virus by PCR NEGATIVE NEGATIVE Final    Comment: (NOTE) Fact Sheet for Patients: BloggerCourse.com  Fact Sheet for Healthcare Providers: SeriousBroker.it  This test is not yet approved or cleared by the [place] FDA and has been authorized for detection and/or diagnosis of SARS-CoV-2 by FDA under an Emergency Use Authorization (EUA). This EUA will remain in effect (meaning this test can be used) for the duration of the COVID-19 declaration under Section 564(b)(1) of the Act, 21 U.S.C. section 360bbb-3(b)(1), unless the authorization is terminated or revoked.  Performed at [facility], [address] [place], [place] [address]     Radiology Studies: CT Head Wo Contrast Result Date: 01/31/2024 CLINICAL DATA:  Altered mental status. EXAM: CT HEAD WITHOUT CONTRAST TECHNIQUE: Contiguous axial images were obtained from the base of the skull through the vertex without intravenous contrast. RADIATION DOSE REDUCTION: This exam was performed according to the departmental dose-optimization program which includes automated exposure control, adjustment of the mA and/or kV according to patient size and/or use of iterative reconstruction technique. COMPARISON:  December 22, 2023 FINDINGS: Brain: There is generalized cerebral atrophy  with widening of the extra-axial spaces and stable ventricular dilatation. There are areas of decreased attenuation within the white matter tracts of the supratentorial brain, consistent with microvascular disease changes. Vascular: No hyperdense vessel or unexpected calcification. Skull: Normal. Negative for fracture or focal lesion. Sinuses/Orbits: No acute finding. Other: None. IMPRESSION: 1. Generalized cerebral atrophy and microvascular disease changes of the supratentorial brain. 2. No acute intracranial abnormality. Electronically Signed   By: Aram Candela M.D.   On: 01/31/2024 19:48   DG Chest Port 1 View Result Date: 01/31/2024 CLINICAL DATA:  Altered mental status and failure to thrive. EXAM: PORTABLE CHEST 1 VIEW COMPARISON:  December 11, 2023 FINDINGS: The heart size and mediastinal contours are within normal limits. Both lungs are clear. Calcified bilateral breast implants are seen. The visualized skeletal structures are unremarkable. IMPRESSION: No active cardiopulmonary disease. Electronically Signed   By: Aram Candela M.D.   On: 01/31/2024 19:45   Scheduled Meds:  feeding supplement (GLUCERNA SHAKE)  237 mL Oral BID BM   ferrous sulfate  330 mg Oral Daily   heparin  5,000 Units Subcutaneous Q8H   insulin aspart  0-9 Units Subcutaneous Q4H   insulin glargine  5 Units Subcutaneous QHS   multivitamin with minerals  1 tablet Oral Daily   rosuvastatin  20 mg Oral QHS   Continuous Infusions:  cefTRIAXone (ROCEPHIN)  IV Stopped (02/02/24 0059)   dextrose 125 mL/hr at 02/02/24 0851    LOS: 2 days   Marguerita Merles, DO [facility] Available via Epic secure chat 7am-7pm After these hours, please refer to coverage provider listed on [email or web address] 02/02/2024, 1:09 PM

## 2024-02-02 NOTE — Plan of Care (Signed)

## 2024-02-03 DIAGNOSIS — E1165 Type 2 diabetes mellitus with hyperglycemia: Secondary | ICD-10-CM | POA: Diagnosis not present

## 2024-02-03 DIAGNOSIS — E860 Dehydration: Secondary | ICD-10-CM | POA: Diagnosis not present

## 2024-02-03 DIAGNOSIS — E785 Hyperlipidemia, unspecified: Secondary | ICD-10-CM | POA: Diagnosis not present

## 2024-02-03 DIAGNOSIS — N179 Acute kidney failure, unspecified: Secondary | ICD-10-CM | POA: Diagnosis not present

## 2024-02-03 LAB — CBC WITH DIFFERENTIAL/PLATELET
Abs Immature Granulocytes: 0.08 10*3/uL — ABNORMAL HIGH (ref 0.00–0.07)
Basophils Absolute: 0 10*3/uL (ref 0.0–0.1)
Basophils Relative: 0 %
Eosinophils Absolute: 0.4 10*3/uL (ref 0.0–0.5)
Eosinophils Relative: 5 %
HCT: 34.7 % — ABNORMAL LOW (ref 36.0–46.0)
Hemoglobin: 10.9 g/dL — ABNORMAL LOW (ref 12.0–15.0)
Immature Granulocytes: 1 %
Lymphocytes Relative: 25 %
Lymphs Abs: 2 10*3/uL (ref 0.7–4.0)
MCH: 30.9 pg (ref 26.0–34.0)
MCHC: 31.4 g/dL (ref 30.0–36.0)
MCV: 98.3 fL (ref 80.0–100.0)
Monocytes Absolute: 0.4 10*3/uL (ref 0.1–1.0)
Monocytes Relative: 5 %
Neutro Abs: 5.1 10*3/uL (ref 1.7–7.7)
Neutrophils Relative %: 64 %
Platelets: 136 10*3/uL — ABNORMAL LOW (ref 150–400)
RBC: 3.53 MIL/uL — ABNORMAL LOW (ref 3.87–5.11)
RDW: 12 % (ref 11.5–15.5)
WBC: 7.9 10*3/uL (ref 4.0–10.5)
nRBC: 0 % (ref 0.0–0.2)

## 2024-02-03 LAB — COMPREHENSIVE METABOLIC PANEL
ALT: 19 U/L (ref 0–44)
AST: 21 U/L (ref 15–41)
Albumin: 3 g/dL — ABNORMAL LOW (ref 3.5–5.0)
Alkaline Phosphatase: 51 U/L (ref 38–126)
Anion gap: 8 (ref 5–15)
BUN: 44 mg/dL — ABNORMAL HIGH (ref 8–23)
CO2: 24 mmol/L (ref 22–32)
Calcium: 9 mg/dL (ref 8.9–10.3)
Chloride: 126 mmol/L — ABNORMAL HIGH (ref 98–111)
Creatinine, Ser: 2.39 mg/dL — ABNORMAL HIGH (ref 0.44–1.00)
GFR, Estimated: 21 mL/min — ABNORMAL LOW (ref >60)
Glucose, Bld: 139 mg/dL — ABNORMAL HIGH (ref 70–99)
Potassium: 3.5 mmol/L (ref 3.5–5.1)
Sodium: 158 mmol/L — ABNORMAL HIGH (ref 135–145)
Total Bilirubin: 0.8 mg/dL (ref 0.0–1.2)
Total Protein: 5.8 g/dL — ABNORMAL LOW (ref 6.5–8.1)

## 2024-02-03 LAB — FERRITIN: Ferritin: 218 ng/mL (ref 11–307)

## 2024-02-03 LAB — BASIC METABOLIC PANEL
Anion gap: 12 (ref 5–15)
Anion gap: 13 (ref 5–15)
Anion gap: 9 (ref 5–15)
BUN: 50 mg/dL — ABNORMAL HIGH (ref 8–23)
BUN: 44 mg/dL — ABNORMAL HIGH (ref 8–23)
BUN: 43 mg/dL — ABNORMAL HIGH (ref 8–23)
CO2: 21 mmol/L — ABNORMAL LOW (ref 22–32)
CO2: 18 mmol/L — ABNORMAL LOW (ref 22–32)
CO2: 23 mmol/L (ref 22–32)
Calcium: 8.9 mg/dL (ref 8.9–10.3)
Calcium: 8.7 mg/dL — ABNORMAL LOW (ref 8.9–10.3)
Calcium: 8.6 mg/dL — ABNORMAL LOW (ref 8.9–10.3)
Chloride: 123 mmol/L — ABNORMAL HIGH (ref 98–111)
Chloride: 125 mmol/L — ABNORMAL HIGH (ref 98–111)
Chloride: 119 mmol/L — ABNORMAL HIGH (ref 98–111)
Creatinine, Ser: 2.51 mg/dL — ABNORMAL HIGH (ref 0.44–1.00)
Creatinine, Ser: 2.22 mg/dL — ABNORMAL HIGH (ref 0.44–1.00)
Creatinine, Ser: 2.76 mg/dL — ABNORMAL HIGH (ref 0.44–1.00)
GFR, Estimated: 20 mL/min — ABNORMAL LOW (ref >60)
GFR, Estimated: 23 mL/min — ABNORMAL LOW (ref >60)
GFR, Estimated: 18 mL/min — ABNORMAL LOW (ref >60)
Glucose, Bld: 129 mg/dL — ABNORMAL HIGH (ref 70–99)
Glucose, Bld: 224 mg/dL — ABNORMAL HIGH (ref 70–99)
Glucose, Bld: 347 mg/dL — ABNORMAL HIGH (ref 70–99)
Potassium: 3.5 mmol/L (ref 3.5–5.1)
Potassium: 4.4 mmol/L (ref 3.5–5.1)
Potassium: 4 mmol/L (ref 3.5–5.1)
Sodium: 156 mmol/L — ABNORMAL HIGH (ref 135–145)
Sodium: 156 mmol/L — ABNORMAL HIGH (ref 135–145)
Sodium: 151 mmol/L — ABNORMAL HIGH (ref 135–145)

## 2024-02-03 LAB — GLUCOSE, CAPILLARY
Glucose-Capillary: 107 mg/dL — ABNORMAL HIGH (ref 70–99)
Glucose-Capillary: 124 mg/dL — ABNORMAL HIGH (ref 70–99)
Glucose-Capillary: 201 mg/dL — ABNORMAL HIGH (ref 70–99)
Glucose-Capillary: 257 mg/dL — ABNORMAL HIGH (ref 70–99)
Glucose-Capillary: 299 mg/dL — ABNORMAL HIGH (ref 70–99)

## 2024-02-03 LAB — VITAMIN B12: Vitamin B-12: 562 pg/mL (ref 180–914)

## 2024-02-03 LAB — MAGNESIUM: Magnesium: 2.1 mg/dL (ref 1.7–2.4)

## 2024-02-03 LAB — IRON AND TIBC
Iron: 96 ug/dL (ref 28–170)
Saturation Ratios: 38 % — ABNORMAL HIGH (ref 10.4–31.8)
TIBC: 252 ug/dL (ref 250–450)
UIBC: 156 ug/dL

## 2024-02-03 LAB — PHOSPHORUS: Phosphorus: 3.5 mg/dL (ref 2.5–4.6)

## 2024-02-03 LAB — FOLATE: Folate: 10.9 ng/mL (ref >5.9)

## 2024-02-03 LAB — RETICULOCYTES
Immature Retic Fract: 6.7 % (ref 2.3–15.9)
RBC.: 3.53 MIL/uL — ABNORMAL LOW (ref 3.87–5.11)
Retic Count, Absolute: 18.4 10*3/uL — ABNORMAL LOW (ref 19.0–186.0)
Retic Ct Pct: 0.5 % (ref 0.4–3.1)

## 2024-02-03 MED ORDER — FERROUS SULFATE 300 (60 FE) MG/5ML PO SOLN
300.0000 mg | Freq: Every day | ORAL | Status: DC
  Administered 2024-02-04 – 2024-02-08 (×5): 300 mg via ORAL
  Filled 2024-02-03 (×5): qty 5

## 2024-02-03 MED ORDER — DEXTROSE 5 % IV SOLN: INTRAVENOUS | Status: DC

## 2024-02-03 NOTE — Progress Notes (Addendum)
 OT Cancellation Note  Patient Details Name: Caitlan Chauca MRN: [id number] DOB: 1956/11/15   Cancelled Treatment:    Reason Eval/Treat Not Completed: (P) OT screened, no needs identified, will sign off, at [facility], plans to return to LTC, family can assist as needed. Signing off, will get mobility team to follow.  Alexis Goodell 02/03/2024, 11:30 AM

## 2024-02-03 NOTE — Progress Notes (Signed)
 PROGRESS NOTE    Mary Swanson  ZOX:[id number] DOB: 04-Sep-1956 DOA: 01/31/2024 PCP: Patient, No Pcp Per   Brief Narrative:  Patient is a 68 year old African-American female with a past medical history significant for essential hypertension, hyperlipidemia, diabetes mellitus type 2, history of Alzheimer's dementia and nonverbal status who resides in a memory care unit.  She is currently DNR/DNI and palliative care follows her in outpatient setting [facility].  Her facility reported of decline over the last 3 weeks and acutely worsened yesterday.  Patient has not had very good oral intake over the last week or so and has not been ambulatory for last few days.  In the ED she was noted to have significant dehydration with an elevated creatinine of 4.92 and severe hypernatremia and elevated bilirubin.  She is awake but not alert and oriented.  SLP evaluated and recommending a dysphagia 2 thin liquid diet for now.  Given her significant hyponatremia we have started her on D5W and sodium is slowly trending down as well as her AKI. Further w/u also reveals an E Coli UTI for which she is on IV Ceftriaxone.   Assessment and Plan:  Significant Dehydration with associated Hypernatremia: Improving, Na+ Trend: Recent Labs  Lab 02/02/24 0002 02/02/24 0420 02/02/24 1331 02/02/24 1954 02/03/24 0158 02/03/24 0757 02/03/24 1259  NA 161* 161* 152* 152* 156* 158* 156*  -Sodium is slowly improving with D5W; patient is getting D5 @ 75 mL/hr now. TSH was 1.361.  Continue to monitor BMPs every 4 hours and try not to overcorrect.  Current rate of fluid should to decrease sodium should be 0.5 mmol/L/hr  Acute Encephalopathy: Improving from admission. At baseline is Non-verbal but was able to verbalize some stuff today and said "Ok". In the setting of above. Head CT done and showed cerebral atrophy and microvascular disease changes of supratentorial brain with no acute intracranial abnormality noted. C/w With IV  fluid hydration and treat urinary tract infection. PT/OT to Evaluate and Treat and she's Ambulating quite well. Delirium Precautions  Acute Kidney Injury [facility]), improving  Metabolic Acidosis  -Nephrology was consulted by EDP who suggested hydration and to let them know if she is not improving. -BUN/Cr Trend: Recent Labs  Lab 02/02/24 0002 02/02/24 0420 02/02/24 1331 02/02/24 1954 02/03/24 0158 02/03/24 0757 02/03/24 1259  BUN 79* 73* 63* 58* 50* 44* 44*  CREATININE 3.75* 3.43* 3.10* 3.02* 2.51* 2.39* 2.22*  -Has a slight metabolic acidosis with a CO2 of 18, anion gap of 13, chloride level of 125 now -Avoid Nephrotoxic Medications, Contrast Dyes, Hypotension and Dehydration to Ensure Adequate Renal Perfusion and will need to Renally Adjust Meds -C/w IVF as above -Continue to Monitor and Trend Renal Function carefully and repeat CMP in the AM    Type 2 Diabetes Mellitus with Hyperglycemia, with long-term current use of insulin (HCC): HbA1c was 8.1. C/w long-acting insulin with 5 units sq Daily; C/w Sensitive Novolog SSI q4h. Continue to Hold Home Metformin. CBG's ranging from 107-201   E Coli UTI (urinary tract infection): Possibly based on UA as urinalysis showed a hazy appearance with moderate hemoglobin, moderate leukocytes, positive nitrites, many bacteria, 0-5 squamous epithelial cells and 21-50 WBCs with urine culture showing >100,000 CFU of E Coli with Sensitivities pending.  Continue with IV Ceftriaxone 1 g every 24 hours for now and adjust Abx as Necessary  Macrocytic Anemia: Hgb/Hct dropping and likely was hemoconcentrated on admission and now having a dilutional drop. Hgb/Hct is now stable at 10.9/34.7.  Anemia panel done and showed an iron level 96, UIBC of 156, TIBC 252, saturation ratio 38%, ferritin level 288, folate level 10.8, vitamin B12 562. CTM for S/Sx of Bleeding; No overt bleeding noted. Repeat CBC in the AM  Thrombocytopenia: Plt Count went from 185 -> 169 -> 153 ->  132 -> 136. CTM for S/Sx of Bleeding; No overt Bleeding noted. Repeat CBC in the AM   Dehydration: C/w Aggressive IV fluid resuscitation as above  HypoMag: Now Mag is 2.1. CTM and Trend and repeat Mag Level in the AM   Hyperlipidemia: Resume Statin with Rosuvastatin 20 mg po daily   Hyperbilirubinemia: Improved. Bilirubin is now 0.8. CTM and Trend and repeat CMP in the AM  Hypoalbuminemia: Patient's Albumin is 3.0 on last check. CTM and Trend and repeat CMP in the AM    DVT prophylaxis: heparin injection 5,000 Units Start: 01/31/24 2230    Code Status: Limited: Do not attempt resuscitation (DNR) -DNR-LIMITED -Do Not Intubate/DNI  Family Communication: No family currently at bedside  Disposition Plan:  Level of care: Telemetry Medical Status is: Inpatient Remains inpatient appropriate because: His further clinical improvement in her laboratory work as her sodium is still elevated and she still has AKI.   Consultants:  None  Procedures:  As delineated as above  Antimicrobials:  Anti-infectives (From admission, onward)    Start     Dose/Rate Route Frequency Ordered Stop   02/01/24 0000  cefTRIAXone (ROCEPHIN) 1 g in sodium chloride 0.9 % 100 mL IVPB        1 g 200 mL/hr over 30 Minutes Intravenous Every 24 hours 01/31/24 2227 02/05/24 2359   01/31/24 1945  cefTRIAXone (ROCEPHIN) 1 g in sodium chloride 0.9 % 100 mL IVPB        1 g 200 mL/hr over 30 Minutes Intravenous  Once 01/31/24 1938 01/31/24 2121       Subjective: Seen and examined at bedside and she is doing okay and overall bit more interactive today and was able to verbalize just a few words to me and stated "okay".  Nursing states that she has been ambulating well and has a good appetite.  No other concerns or complaints at this time.  Objective: Vitals:   02/03/24 0454 02/03/24 0802 02/03/24 0822 02/03/24 1128  BP: (!) 141/97 (!) 157/82 (!) 138/116 132/74  Pulse: (!) 57 64 (!) 57 62  Resp: 16  16 16   Temp: 97.7  F (36.5 C) 98.6 F (37 C) 98.1 F (36.7 C) 98.1 F (36.7 C)  TempSrc: Oral Oral Oral Oral  SpO2: 97% 100% 100% 100%    Intake/Output Summary (Last 24 hours) at 02/03/2024 1602 Last data filed at 02/03/2024 1337 Gross per 24 hour  Intake 236 ml  Output 700 ml  Net -464 ml   There were no vitals filed for this visit.  Examination: Physical Exam:  Constitutional: Chronically ill-appearing African-American female who is more awake and was able to verbalize a few words to me. Respiratory: Diminished to auscultation bilaterally, no wheezing, rales, rhonchi or crackles. Normal respiratory effort and patient is not tachypenic. No accessory muscle use.  Unlabored breathing Cardiovascular: RRR, no murmurs / rubs / gallops. S1 and S2 auscultated. No extremity edema.   Abdomen: Soft, non-tender, non-distended. Bowel sounds positive.  GU: Deferred. Musculoskeletal: No clubbing / cyanosis of digits/nails. No joint deformity upper and lower extremities. Skin: No rashes, lesions, ulcers. No induration; Warm and dry.  Neurologic: Essentially nonverbal but was able to  verbalize a few words.  Does not really follow commands. Psychiatric: Appears calm and resting  Data Reviewed: I have personally reviewed following labs and imaging studies  CBC: Recent Labs  Lab 01/31/24 1809 01/31/24 2349 02/01/24 0752 02/02/24 0154 02/03/24 0757  WBC 10.2 9.0 7.5 9.0 7.9  NEUTROABS  --   --   --  5.3 5.1  HGB 12.8 11.7* 11.0* 11.0* 10.9*  HCT 43.5 39.1 37.1 37.1 34.7*  MCV 105.1* 104.0* 104.2* 105.1* 98.3  PLT 185 169 153 132* 136*   Basic Metabolic Panel: Recent Labs  Lab 02/02/24 0420 02/02/24 1331 02/02/24 1954 02/03/24 0158 02/03/24 0757 02/03/24 1259  NA 161* 152* 152* 156* 158* 156*  K 3.6 5.0 4.6 3.5 3.5 4.4  CL 128* 120* 120* 123* 126* 125*  CO2 22 19* 17* 21* 24 18*  GLUCOSE 193* 351* 266* 129* 139* 224*  BUN 73* 63* 58* 50* 44* 44*  CREATININE 3.43* 3.10* 3.02* 2.51* 2.39* 2.22*   CALCIUM 8.7* 8.8* 8.9 8.9 9.0 8.7*  MG 1.6*  --   --   --  2.1  --   PHOS 3.3  --   --   --  3.5  --    GFR: CrCl cannot be calculated (Unknown ideal weight.). Liver Function Tests: Recent Labs  Lab 01/31/24 1809 02/01/24 0752 02/02/24 0420 02/03/24 0757  AST 15 14* 18 21  ALT 17 14 15 19   ALKPHOS 59 51 49 51  BILITOT 1.7* 1.1 0.7 0.8  PROT 7.3 6.3* 6.2* 5.8*  ALBUMIN 3.4* 3.0* 2.9* 3.0*   No results for input(s): "LIPASE", "AMYLASE" in the last 168 hours. Recent Labs  Lab 01/31/24 1809  AMMONIA 19   Coagulation Profile: No results for input(s): "INR", "PROTIME" in the last 168 hours. Cardiac Enzymes: No results for input(s): "CKTOTAL", "CKMB", "CKMBINDEX", "TROPONINI" in the last 168 hours. BNP (last 3 results) No results for input(s): "PROBNP" in the last 8760 hours. HbA1C: Recent Labs    01/31/24 2349  HGBA1C 8.1*   CBG: Recent Labs  Lab 02/02/24 1832 02/02/24 1945 02/03/24 0635 02/03/24 0826 02/03/24 1236  GLUCAP 256* 221* 107* 124* 201*   Lipid Profile: No results for input(s): "CHOL", "HDL", "LDLCALC", "TRIG", "CHOLHDL", "LDLDIRECT" in the last 72 hours. Thyroid Function Tests: Recent Labs    01/31/24 2349  TSH 1.361   Anemia Panel: Recent Labs    02/03/24 0757  VITAMINB12 562  FOLATE 10.9  FERRITIN 218  TIBC 252  IRON 96  RETICCTPCT 0.5   Sepsis Labs: No results for input(s): "PROCALCITON", "LATICACIDVEN" in the last 168 hours.  Recent Results (from the past 240 hours)  Urine Culture     Status: Abnormal (Preliminary result)   Collection Time: 01/31/24  5:42 PM   Specimen: Urine, Random  Result Value Ref Range Status   Specimen Description URINE, RANDOM  Final   Special Requests NONE Reflexed from [id number]  Final   Culture (A)  Final    >=100,000 COLONIES/mL ESCHERICHIA COLI SUSCEPTIBILITIES TO FOLLOW Performed at [facility], [address], [place], [place] [address]    Report Status PENDING  Incomplete  Resp panel by  RT-PCR (RSV, Flu A&B, Covid) Anterior Nasal Swab     Status: None   Collection Time: 01/31/24  7:47 PM   Specimen: Anterior Nasal Swab  Result Value Ref Range Status   SARS Coronavirus 2 by RT PCR NEGATIVE NEGATIVE Final   Influenza A by PCR NEGATIVE NEGATIVE Final  Influenza B by PCR NEGATIVE NEGATIVE Final    Comment: (NOTE) The Xpert Xpress SARS-CoV-2/FLU/RSV plus assay is intended as an aid in the diagnosis of influenza from Nasopharyngeal swab specimens and should not be used as a sole basis for treatment. Nasal washings and aspirates are unacceptable for Xpert Xpress SARS-CoV-2/FLU/RSV testing.  Fact Sheet for Patients: BloggerCourse.com  Fact Sheet for Healthcare Providers: SeriousBroker.it  This test is not yet approved or cleared by the [place] FDA and has been authorized for detection and/or diagnosis of SARS-CoV-2 by FDA under an Emergency Use Authorization (EUA). This EUA will remain in effect (meaning this test can be used) for the duration of the COVID-19 declaration under Section 564(b)(1) of the Act, 21 U.S.C. section 360bbb-3(b)(1), unless the authorization is terminated or revoked.     Resp Syncytial Virus by PCR NEGATIVE NEGATIVE Final    Comment: (NOTE) Fact Sheet for Patients: BloggerCourse.com  Fact Sheet for Healthcare Providers: SeriousBroker.it  This test is not yet approved or cleared by the [place] FDA and has been authorized for detection and/or diagnosis of SARS-CoV-2 by FDA under an Emergency Use Authorization (EUA). This EUA will remain in effect (meaning this test can be used) for the duration of the COVID-19 declaration under Section 564(b)(1) of the Act, 21 U.S.C. section 360bbb-3(b)(1), unless the authorization is terminated or revoked.  Performed at [facility], [address], [place], [place] [address]      Radiology Studies: No results found.  Scheduled Meds:  feeding supplement  237 mL Oral BID BM   feeding supplement (GLUCERNA SHAKE)  237 mL Oral BID BM   ferrous sulfate  330 mg Oral Daily   heparin  5,000 Units Subcutaneous Q8H   insulin aspart  0-15 Units Subcutaneous TID WC   insulin aspart  0-5 Units Subcutaneous QHS   insulin glargine  5 Units Subcutaneous QHS   multivitamin with minerals  1 tablet Oral Daily   rosuvastatin  20 mg Oral QHS   Continuous Infusions:  cefTRIAXone (ROCEPHIN)  IV 1 g (02/03/24 0002)   dextrose 75 mL/hr at 02/03/24 0940    LOS: 3 days   Marguerita Merles, DO [facility] Available via Epic secure chat 7am-7pm After these hours, please refer to coverage provider listed on [email or web address] 02/03/2024, 4:02 PM

## 2024-02-03 NOTE — Plan of Care (Signed)
  Problem: Education: Goal: Ability to describe self-care measures that may prevent or decrease complications (Diabetes Survival Skills Education) will improve 02/03/2024 1616 by Alvis Lemmings, RN Outcome: Progressing 02/03/2024 1139 by Alvis Lemmings, RN Outcome: Progressing Goal: Individualized Educational Video(s) 02/03/2024 1616 by Alvis Lemmings, RN Outcome: Progressing 02/03/2024 1139 by Alvis Lemmings, RN Outcome: Progressing   Problem: Coping: Goal: Ability to adjust to condition or change in health will improve 02/03/2024 1616 by Alvis Lemmings, RN Outcome: Progressing 02/03/2024 1139 by Alvis Lemmings, RN Outcome: Progressing   Problem: Fluid Volume: Goal: Ability to maintain a balanced intake and output will improve 02/03/2024 1616 by Alvis Lemmings, RN Outcome: Progressing 02/03/2024 1139 by Alvis Lemmings, RN Outcome: Progressing   Problem: Health Behavior/Discharge Planning: Goal: Ability to identify and utilize available resources and services will improve 02/03/2024 1616 by Alvis Lemmings, RN Outcome: Progressing 02/03/2024 1139 by Alvis Lemmings, RN Outcome: Progressing Goal: Ability to manage health-related needs will improve 02/03/2024 1616 by Alvis Lemmings, RN Outcome: Progressing 02/03/2024 1139 by Alvis Lemmings, RN Outcome: Progressing

## 2024-02-03 NOTE — Plan of Care (Signed)

## 2024-02-04 DIAGNOSIS — E1165 Type 2 diabetes mellitus with hyperglycemia: Secondary | ICD-10-CM | POA: Diagnosis not present

## 2024-02-04 DIAGNOSIS — E785 Hyperlipidemia, unspecified: Secondary | ICD-10-CM | POA: Diagnosis not present

## 2024-02-04 DIAGNOSIS — N179 Acute kidney failure, unspecified: Secondary | ICD-10-CM | POA: Diagnosis not present

## 2024-02-04 DIAGNOSIS — E860 Dehydration: Secondary | ICD-10-CM | POA: Diagnosis not present

## 2024-02-04 LAB — BASIC METABOLIC PANEL
Anion gap: 7 (ref 5–15)
Anion gap: 7 (ref 5–15)
Anion gap: 13 (ref 5–15)
BUN: 35 mg/dL — ABNORMAL HIGH (ref 8–23)
BUN: 33 mg/dL — ABNORMAL HIGH (ref 8–23)
BUN: 31 mg/dL — ABNORMAL HIGH (ref 8–23)
CO2: 26 mmol/L (ref 22–32)
CO2: 25 mmol/L (ref 22–32)
CO2: 23 mmol/L (ref 22–32)
Calcium: 9.1 mg/dL (ref 8.9–10.3)
Calcium: 9 mg/dL (ref 8.9–10.3)
Calcium: 9.1 mg/dL (ref 8.9–10.3)
Chloride: 122 mmol/L — ABNORMAL HIGH (ref 98–111)
Chloride: 120 mmol/L — ABNORMAL HIGH (ref 98–111)
Chloride: 117 mmol/L — ABNORMAL HIGH (ref 98–111)
Creatinine, Ser: 2.37 mg/dL — ABNORMAL HIGH (ref 0.44–1.00)
Creatinine, Ser: 1.96 mg/dL — ABNORMAL HIGH (ref 0.44–1.00)
Creatinine, Ser: 1.86 mg/dL — ABNORMAL HIGH (ref 0.44–1.00)
GFR, Estimated: 22 mL/min — ABNORMAL LOW (ref >60)
GFR, Estimated: 27 mL/min — ABNORMAL LOW (ref >60)
GFR, Estimated: 29 mL/min — ABNORMAL LOW (ref >60)
Glucose, Bld: 251 mg/dL — ABNORMAL HIGH (ref 70–99)
Glucose, Bld: 221 mg/dL — ABNORMAL HIGH (ref 70–99)
Glucose, Bld: 153 mg/dL — ABNORMAL HIGH (ref 70–99)
Potassium: 3.9 mmol/L (ref 3.5–5.1)
Potassium: 4.2 mmol/L (ref 3.5–5.1)
Potassium: 3.8 mmol/L (ref 3.5–5.1)
Sodium: 155 mmol/L — ABNORMAL HIGH (ref 135–145)
Sodium: 152 mmol/L — ABNORMAL HIGH (ref 135–145)
Sodium: 153 mmol/L — ABNORMAL HIGH (ref 135–145)

## 2024-02-04 LAB — CBC WITH DIFFERENTIAL/PLATELET
Abs Immature Granulocytes: 0.1 10*3/uL — ABNORMAL HIGH (ref 0.00–0.07)
Basophils Absolute: 0 10*3/uL (ref 0.0–0.1)
Basophils Relative: 0 %
Eosinophils Absolute: 0.3 10*3/uL (ref 0.0–0.5)
Eosinophils Relative: 5 %
HCT: 33.5 % — ABNORMAL LOW (ref 36.0–46.0)
Hemoglobin: 10.6 g/dL — ABNORMAL LOW (ref 12.0–15.0)
Immature Granulocytes: 2 %
Lymphocytes Relative: 26 %
Lymphs Abs: 1.8 10*3/uL (ref 0.7–4.0)
MCH: 31.1 pg (ref 26.0–34.0)
MCHC: 31.6 g/dL (ref 30.0–36.0)
MCV: 98.2 fL (ref 80.0–100.0)
Monocytes Absolute: 0.5 10*3/uL (ref 0.1–1.0)
Monocytes Relative: 7 %
Neutro Abs: 4.1 10*3/uL (ref 1.7–7.7)
Neutrophils Relative %: 60 %
Platelets: 117 10*3/uL — ABNORMAL LOW (ref 150–400)
RBC: 3.41 MIL/uL — ABNORMAL LOW (ref 3.87–5.11)
RDW: 12 % (ref 11.5–15.5)
WBC: 6.8 10*3/uL (ref 4.0–10.5)
nRBC: 0 % (ref 0.0–0.2)

## 2024-02-04 LAB — COMPREHENSIVE METABOLIC PANEL
ALT: 37 U/L (ref 0–44)
AST: 38 U/L (ref 15–41)
Albumin: 2.8 g/dL — ABNORMAL LOW (ref 3.5–5.0)
Alkaline Phosphatase: 58 U/L (ref 38–126)
Anion gap: 6 (ref 5–15)
BUN: 36 mg/dL — ABNORMAL HIGH (ref 8–23)
CO2: 25 mmol/L (ref 22–32)
Calcium: 8.9 mg/dL (ref 8.9–10.3)
Chloride: 124 mmol/L — ABNORMAL HIGH (ref 98–111)
Creatinine, Ser: 2.3 mg/dL — ABNORMAL HIGH (ref 0.44–1.00)
GFR, Estimated: 22 mL/min — ABNORMAL LOW (ref >60)
Glucose, Bld: 179 mg/dL — ABNORMAL HIGH (ref 70–99)
Potassium: 3.6 mmol/L (ref 3.5–5.1)
Sodium: 155 mmol/L — ABNORMAL HIGH (ref 135–145)
Total Bilirubin: 0.7 mg/dL (ref 0.0–1.2)
Total Protein: 6.2 g/dL — ABNORMAL LOW (ref 6.5–8.1)

## 2024-02-04 LAB — PHOSPHORUS: Phosphorus: 3.1 mg/dL (ref 2.5–4.6)

## 2024-02-04 LAB — GLUCOSE, CAPILLARY
Glucose-Capillary: 170 mg/dL — ABNORMAL HIGH (ref 70–99)
Glucose-Capillary: 191 mg/dL — ABNORMAL HIGH (ref 70–99)
Glucose-Capillary: 208 mg/dL — ABNORMAL HIGH (ref 70–99)
Glucose-Capillary: 262 mg/dL — ABNORMAL HIGH (ref 70–99)
Glucose-Capillary: 281 mg/dL — ABNORMAL HIGH (ref 70–99)
Glucose-Capillary: 284 mg/dL — ABNORMAL HIGH (ref 70–99)

## 2024-02-04 LAB — URINE CULTURE: Culture: >=100000 — AB

## 2024-02-04 LAB — MAGNESIUM: Magnesium: 1.9 mg/dL (ref 1.7–2.4)

## 2024-02-04 MED ORDER — INSULIN GLARGINE 100 UNIT/ML ~~LOC~~ SOLN
10.0000 [IU] | Freq: Every day | SUBCUTANEOUS | Status: DC
  Administered 2024-02-04 – 2024-02-07 (×4): 10 [IU] via SUBCUTANEOUS
  Filled 2024-02-04 (×5): qty 0.1

## 2024-02-04 MED ORDER — DEXTROSE 5 % IV SOLN: INTRAVENOUS | Status: AC

## 2024-02-04 NOTE — Plan of Care (Signed)

## 2024-02-04 NOTE — Progress Notes (Addendum)
 Mobility Specialist: Progress Note   02/04/24 1611  Mobility  Activity Ambulated with assistance in hallway  Level of Assistance +2 (takes two people)  Press photographer wheel walker  Distance Ambulated (ft) 200 ft  Activity Response Tolerated well  Mobility Referral Yes  Mobility visit 1 Mobility  Mobility Specialist Start Time (ACUTE ONLY) 1425  Mobility Specialist Stop Time (ACUTE ONLY) 1445  Mobility Specialist Time Calculation (min) (ACUTE ONLY) 20 min    Pt received in bed. Non-verbal through most of session but expressing that she is agreeable through facial expression and head nods. ModA for bed mobility. ModA+2 with RW. Mostly minA for physical assistance but modA+2 needed for line management as well as verbal and tactile cues for staying on task, redirection, and RW steering and proximity. Had a BM in bed and required minA +2 for pericare. Needed to be redirected multiples times before eventually needing physical assist to follow directions. Left in chair with all needs met, call bell in reach. Chair alarm restraint on.   Maurene Capes Mobility Specialist Please contact via SecureChat or Rehab office at [phone]

## 2024-02-04 NOTE — Care Management Important Message (Signed)
 Important Message  Patient Details  Name: Mary Swanson MRN: [id number] Date of Birth: Oct 01, 1956   Important Message Given:  Yes - Medicare IM     Dorena Bodo 02/04/2024, 3:28 PM

## 2024-02-04 NOTE — Progress Notes (Signed)
 Mobility Specialist: Progress Note   02/04/24 1617  Mobility  Activity Transferred from chair to bed  Level of Assistance Moderate assist, patient does 50-74%  Assistive Device Other (Comment) (HHA)  Distance Ambulated (ft) 5 ft  Activity Response Tolerated well  Mobility Referral Yes  Mobility visit 1 Mobility  Mobility Specialist Start Time (ACUTE ONLY) 1540  Mobility Specialist Stop Time (ACUTE ONLY) 1550  Mobility Specialist Time Calculation (min) (ACUTE ONLY) 10 min    Ms returned to transfer pt back to bed - received in chair. ModA for physical assistance as well as verbal and tactile cues for redirection and staying on task. MaxA for bed mobility. Left in bed with all needs met, call bell in reach. Bed alarm on. NT in room.   Maurene Capes Mobility Specialist Please contact via SecureChat or Rehab office at [phone]

## 2024-02-04 NOTE — Progress Notes (Signed)
 PROGRESS NOTE    Mary Swanson  WUJ:[id number] DOB: 1956-02-25 DOA: 01/31/2024 PCP: Patient, No Pcp Per   Brief Narrative:  Patient is a 68 year old African-American female with a past medical history significant for essential hypertension, hyperlipidemia, diabetes mellitus type 2, history of Alzheimer's dementia and nonverbal status who resides in a memory care unit.  She is currently DNR/DNI and palliative care follows her in outpatient setting [facility].  Her facility reported of decline over the last 3 weeks and acutely worsened yesterday.  Patient has not had very good oral intake over the last week or so and has not been ambulatory for last few days.  In the ED she was noted to have significant dehydration with an elevated creatinine of 4.92 and severe hypernatremia and elevated bilirubin.  She is awake but not alert and oriented.  SLP evaluated and recommending a dysphagia 2 thin liquid diet for now.  Given her significant hyponatremia we have started her on D5W and sodium is slowly trending down as well as her AKI. Further w/u also reveals an E Coli UTI for which she is on IV Ceftriaxone.   Assessment and Plan:  Significant Dehydration with associated Hypernatremia: Improving slowly, Na+ Trend: Recent Labs  Lab 02/02/24 1954 02/03/24 0158 02/03/24 0757 02/03/24 1259 02/03/24 1824 02/04/24 0736 02/04/24 0923  NA 152* 156* 158* 156* 151* 155* 155*  -Sodium is slowly improving with D5W; patient is getting D5 @ 75 mL/hr now. TSH was 1.361.  Continue to monitor BMPs every 4 hours and try not to overcorrect.  Current rate of fluid should to decrease sodium should be 0.5 mmol/L/hr  Acute Encephalopathy: At baseline is Non-verbal and did not verbalize anything to me today like she did a few days ago. In the setting of above. Head CT done and showed cerebral atrophy and microvascular disease changes of supratentorial brain with no acute intracranial abnormality noted. C/w With IV fluid  hydration and treat UTI. PT/OT to Evaluate and Treat and she's Ambulating quite well. Delirium Precautions. Improved a little since admisison   Acute Kidney Injury [facility]), improving  Metabolic Acidosis  -Nephrology was consulted by EDP who suggested hydration and to let them know if she is not improving. -BUN/Cr Trend slowly improving : Recent Labs  Lab 02/02/24 1954 02/03/24 0158 02/03/24 0757 02/03/24 1259 02/03/24 1824 02/04/24 0736 02/04/24 0923  BUN 58* 50* 44* 44* 43* 36* 35*  CREATININE 3.02* 2.51* 2.39* 2.22* 2.76* 2.30* 2.37*  -Metabolic Acidosis improved with a CO2 of 26, anion gap of 7, chloride level of 122 now -Avoid Nephrotoxic Medications, Contrast Dyes, Hypotension and Dehydration to Ensure Adequate Renal Perfusion and will need to Renally Adjust Meds -C/w IVF as above -Continue to Monitor and Trend Renal Function carefully and repeat CMP in the AM    Type 2 Diabetes Mellitus with Hyperglycemia, with long-term current use of insulin (HCC): HbA1c was 8.1. Increase long-acting insulin to 10 units sq Daily; Change Sensitive Novolog SSI q4h to AC/HS. Continue to Hold Home Metformin. CBG's ranging from 107-201   E Coli UTI (urinary tract infection): Possibly based on UA as urinalysis showed a hazy appearance with moderate hemoglobin, moderate leukocytes, positive nitrites, many bacteria, 0-5 squamous epithelial cells and 21-50 WBCs with urine culture showing >100,000 CFU of E Coli and is Pan-sensitive so will Continue with IV Ceftriaxone 1 g every 24 hours for now and change to po closer to D/C  Macrocytic Anemia: Hgb/Hct dropping and likely was hemoconcentrated on admission and  now having a dilutional drop. Hgb/Hct is now stable at 10.6/33.5.  Anemia panel done and showed an iron level 96, UIBC of 156, TIBC 252, saturation ratio 38%, ferritin level 288, folate level 10.8, vitamin B12 562. CTM for S/Sx of Bleeding; No overt bleeding noted. Repeat CBC in the AM  Thrombocytopenia:  Plt Count went from 185 -> 169 -> 153 -> 132 -> 136 -> 117. CTM for S/Sx of Bleeding; No overt Bleeding noted. Repeat CBC in the AM   Dehydration: C/w Aggressive IV fluid resuscitation as above  HypoMag: Now Mag is 1.9. CTM and Trend and repeat Mag Level in the AM   Hyperlipidemia: Continue Statin with Rosuvastatin 20 mg po daily   Hyperbilirubinemia: Improved. Bilirubin is now 0.7. CTM and Trend and repeat CMP in the AM  Hypoalbuminemia: Patient's Albumin is 2.8 on last check. CTM and Trend and repeat CMP in the AM    DVT prophylaxis: heparin injection 5,000 Units Start: 01/31/24 2230    Code Status: Limited: Do not attempt resuscitation (DNR) -DNR-LIMITED -Do Not Intubate/DNI  Family Communication: No family present at bedside  Disposition Plan:  Level of care: Telemetry Medical Status is: Inpatient Remains inpatient appropriate because: Needs further clinical improvement in her sodium and mentation    Consultants:  None  Procedures:  As delineated as above  Antimicrobials:  Anti-infectives (From admission, onward)    Start     Dose/Rate Route Frequency Ordered Stop   02/01/24 0000  cefTRIAXone (ROCEPHIN) 1 g in sodium chloride 0.9 % 100 mL IVPB        1 g 200 mL/hr over 30 Minutes Intravenous Every 24 hours 01/31/24 2227 02/05/24 2159   01/31/24 1945  cefTRIAXone (ROCEPHIN) 1 g in sodium chloride 0.9 % 100 mL IVPB        1 g 200 mL/hr over 30 Minutes Intravenous  Once 01/31/24 1938 01/31/24 2121       Subjective: Seen and examined at bedside and she is a little lethargic today.  Briefly woke up when I examined her but was nonverbal and nonconversant.  Went back to sleep.  No other concerns at this time.  Objective: Vitals:   02/04/24 0002 02/04/24 0347 02/04/24 0741 02/04/24 1559  BP: 112/88 (!) 152/88 (!) 146/64 130/62  Pulse: 71 60 63 65  Resp: 18 18  16   Temp: 97.6 F (36.4 C) 97.6 F (36.4 C) 97.8 F (36.6 C) 97.9 F (36.6 C)  TempSrc: Oral Oral  Oral   SpO2: 93% 100% 99% 100%    Intake/Output Summary (Last 24 hours) at 02/04/2024 1844 Last data filed at 02/04/2024 1000 Gross per 24 hour  Intake 250 ml  Output 304 ml  Net -54 ml   There were no vitals filed for this visit.  Examination: Physical Exam:  Constitutional: Chronically ill-appearing African-American female who is somnolent and drowsy and not as awake and alert Respiratory: Diminished to auscultation bilaterally, no wheezing, rales, rhonchi or crackles. Normal respiratory effort and patient is not tachypenic. No accessory muscle use.  Unlabored breathing Cardiovascular: RRR, no murmurs / rubs / gallops. S1 and S2 auscultated. No extremity edema.  Abdomen: Soft, non-tender, non-distended. Bowel sounds positive.  GU: Deferred. Musculoskeletal: No clubbing / cyanosis of digits/nails. No joint deformity upper and lower extremities. Skin: No rashes, lesions, ulcers on limited skin evaluation. No induration; Warm and dry.  Neurologic: And drowsy and not as awake and alert as she was yesterday.  Remains nonverbal and does not follow commands  Psychiatric: Appears calm  Data Reviewed: I have personally reviewed following labs and imaging studies  CBC: Recent Labs  Lab 01/31/24 2349 02/01/24 0752 02/02/24 0154 02/03/24 0757 02/04/24 0736  WBC 9.0 7.5 9.0 7.9 6.8  NEUTROABS  --   --  5.3 5.1 4.1  HGB 11.7* 11.0* 11.0* 10.9* 10.6*  HCT 39.1 37.1 37.1 34.7* 33.5*  MCV 104.0* 104.2* 105.1* 98.3 98.2  PLT 169 153 132* 136* 117*   Basic Metabolic Panel: Recent Labs  Lab 02/02/24 0420 02/02/24 1331 02/03/24 0757 02/03/24 1259 02/03/24 1824 02/04/24 0736 02/04/24 0923  NA 161*   < > 158* 156* 151* 155* 155*  K 3.6   < > 3.5 4.4 4.0 3.6 3.9  CL 128*   < > 126* 125* 119* 124* 122*  CO2 22   < > 24 18* 23 25 26   GLUCOSE 193*   < > 139* 224* 347* 179* 251*  BUN 73*   < > 44* 44* 43* 36* 35*  CREATININE 3.43*   < > 2.39* 2.22* 2.76* 2.30* 2.37*  CALCIUM 8.7*   < > 9.0  8.7* 8.6* 8.9 9.1  MG 1.6*  --  2.1  --   --  1.9  --   PHOS 3.3  --  3.5  --   --  3.1  --    < > = values in this interval not displayed.   GFR: CrCl cannot be calculated (Unknown ideal weight.). Liver Function Tests: Recent Labs  Lab 01/31/24 1809 02/01/24 0752 02/02/24 0420 02/03/24 0757 02/04/24 0736  AST 15 14* 18 21 38  ALT 17 14 15 19  37  ALKPHOS 59 51 49 51 58  BILITOT 1.7* 1.1 0.7 0.8 0.7  PROT 7.3 6.3* 6.2* 5.8* 6.2*  ALBUMIN 3.4* 3.0* 2.9* 3.0* 2.8*   No results for input(s): "LIPASE", "AMYLASE" in the last 168 hours. Recent Labs  Lab 01/31/24 1809  AMMONIA 19   Coagulation Profile: No results for input(s): "INR", "PROTIME" in the last 168 hours. Cardiac Enzymes: No results for input(s): "CKTOTAL", "CKMB", "CKMBINDEX", "TROPONINI" in the last 168 hours. BNP (last 3 results) No results for input(s): "PROBNP" in the last 8760 hours. HbA1C: No results for input(s): "HGBA1C" in the last 72 hours. CBG: Recent Labs  Lab 02/04/24 0004 02/04/24 0349 02/04/24 0743 02/04/24 1212 02/04/24 1558  GLUCAP 262* 191* 170* 284* 281*   Lipid Profile: No results for input(s): "CHOL", "HDL", "LDLCALC", "TRIG", "CHOLHDL", "LDLDIRECT" in the last 72 hours. Thyroid Function Tests: No results for input(s): "TSH", "T4TOTAL", "FREET4", "T3FREE", "THYROIDAB" in the last 72 hours. Anemia Panel: Recent Labs    02/03/24 0757  VITAMINB12 562  FOLATE 10.9  FERRITIN 218  TIBC 252  IRON 96  RETICCTPCT 0.5   Sepsis Labs: No results for input(s): "PROCALCITON", "LATICACIDVEN" in the last 168 hours.  Recent Results (from the past 240 hours)  Urine Culture     Status: Abnormal   Collection Time: 01/31/24  5:42 PM   Specimen: Urine, Random  Result Value Ref Range Status   Specimen Description URINE, RANDOM  Final   Special Requests   Final    NONE Reflexed from [id number] Performed at [facility], [address], [place], [place] [address]    Culture >=100,000  COLONIES/mL ESCHERICHIA COLI (A)  Final   Report Status 02/04/2024 FINAL  Final   Organism ID, Bacteria ESCHERICHIA COLI (A)  Final      Susceptibility   Escherichia coli -  MIC*    AMPICILLIN 8 SENSITIVE Sensitive     CEFAZOLIN <=4 SENSITIVE Sensitive     CEFEPIME <=0.12 SENSITIVE Sensitive     CEFTRIAXONE <=0.25 SENSITIVE Sensitive     CIPROFLOXACIN <=0.25 SENSITIVE Sensitive     GENTAMICIN <=1 SENSITIVE Sensitive     IMIPENEM <=0.25 SENSITIVE Sensitive     NITROFURANTOIN <=16 SENSITIVE Sensitive     TRIMETH/SULFA <=20 SENSITIVE Sensitive     AMPICILLIN/SULBACTAM <=2 SENSITIVE Sensitive     PIP/TAZO <=4 SENSITIVE Sensitive ug/mL    * >=100,000 COLONIES/mL ESCHERICHIA COLI  Resp panel by RT-PCR (RSV, Flu A&B, Covid) Anterior Nasal Swab     Status: None   Collection Time: 01/31/24  7:47 PM   Specimen: Anterior Nasal Swab  Result Value Ref Range Status   SARS Coronavirus 2 by RT PCR NEGATIVE NEGATIVE Final   Influenza A by PCR NEGATIVE NEGATIVE Final   Influenza B by PCR NEGATIVE NEGATIVE Final    Comment: (NOTE) The Xpert Xpress SARS-CoV-2/FLU/RSV plus assay is intended as an aid in the diagnosis of influenza from Nasopharyngeal swab specimens and should not be used as a sole basis for treatment. Nasal washings and aspirates are unacceptable for Xpert Xpress SARS-CoV-2/FLU/RSV testing.  Fact Sheet for Patients: BloggerCourse.com  Fact Sheet for Healthcare Providers: SeriousBroker.it  This test is not yet approved or cleared by the [place] FDA and has been authorized for detection and/or diagnosis of SARS-CoV-2 by FDA under an Emergency Use Authorization (EUA). This EUA will remain in effect (meaning this test can be used) for the duration of the COVID-19 declaration under Section 564(b)(1) of the Act, 21 U.S.C. section 360bbb-3(b)(1), unless the authorization is terminated or revoked.     Resp Syncytial Virus by PCR  NEGATIVE NEGATIVE Final    Comment: (NOTE) Fact Sheet for Patients: BloggerCourse.com  Fact Sheet for Healthcare Providers: SeriousBroker.it  This test is not yet approved or cleared by the [place] FDA and has been authorized for detection and/or diagnosis of SARS-CoV-2 by FDA under an Emergency Use Authorization (EUA). This EUA will remain in effect (meaning this test can be used) for the duration of the COVID-19 declaration under Section 564(b)(1) of the Act, 21 U.S.C. section 360bbb-3(b)(1), unless the authorization is terminated or revoked.  Performed at [facility], [address], [place], [place] [address]     Radiology Studies: No results found.  Scheduled Meds:  feeding supplement  237 mL Oral BID BM   feeding supplement (GLUCERNA SHAKE)  237 mL Oral BID BM   ferrous sulfate  300 mg Oral Daily   heparin  5,000 Units Subcutaneous Q8H   insulin aspart  0-15 Units Subcutaneous TID WC   insulin aspart  0-5 Units Subcutaneous QHS   insulin glargine  10 Units Subcutaneous QHS   multivitamin with minerals  1 tablet Oral Daily   rosuvastatin  20 mg Oral QHS   Continuous Infusions:  cefTRIAXone (ROCEPHIN)  IV 1 g (02/03/24 2258)   dextrose 75 mL/hr at 02/04/24 1001    LOS: 4 days   Marguerita Merles, DO [facility] Available via Epic secure chat 7am-7pm After these hours, please refer to coverage provider listed on [email or web address] 02/04/2024, 6:44 PM

## 2024-02-04 NOTE — Progress Notes (Signed)
 Speech Language Pathology Treatment: Dysphagia  Patient Details Name: Mary Swanson MRN: [id number] DOB: 08/05/1956 Today's Date: 02/04/2024 Time: 1021-1029 SLP Time Calculation (min) (ACUTE ONLY): 8 min  Assessment / Plan / Recommendation Clinical Impression  Pt presents with seemingly improved mentation, although continues to demonstrate signs of a cognitively based dysphagia. She was able to initiate sips via straw today without signs clinically concerning for aspiration. She took bites of a cracker and masticated it thoroughly, achieving complete oral clearance. Minimal oral holding was noted with liquids, but she responded to cueing to initiate a swallow. Recommend upgrading diet to Dys 3 solids and continuing thin liquids. Pills should be crushed in puree. She will continue to benefit from full supervision to provide careful hand feeding. SLP will sign off acutely, but suspect she may require ongoing f/u at d/c to manage diet modification and feeding given history of Alzheimer's dementia.    HPI HPI: 68yo female admitted from [facility] 01/31/24 with AMS. PMH: HTN, HLD, DM2, Alzheimer's dementia, nonverbal status, DNR/DNI. CXR - no active disease. CTHead =  Generalized cerebral atrophy and microvascular disease changes of the supratentorial brain. No acute intracranial abnormality.      SLP Plan  All goals met      Recommendations for follow up therapy are one component of a multi-disciplinary discharge planning process, led by the attending physician.  Recommendations may be updated based on patient status, additional functional criteria and insurance authorization.    Recommendations  Diet recommendations: Thin liquid;Dysphagia 3 (mechanical soft) Liquids provided via: Cup;Straw Medication Administration: Crushed with puree Supervision: Staff to assist with self feeding;Full supervision/cueing for compensatory strategies Compensations: Minimize environmental distractions;Slow  rate;Small sips/bites Postural Changes and/or Swallow Maneuvers: Seated upright 90 degrees                  Oral care BID;Staff/trained caregiver to provide oral care   Frequent or constant Supervision/Assistance Dysphagia, unspecified (R13.10)     All goals met     Gwynneth Aliment, M.A., CF-SLP Speech Language Pathology, Acute Rehabilitation Services  Secure Chat preferred [phone]   02/04/2024, 10:48 AM

## 2024-02-04 NOTE — Progress Notes (Signed)
 Unable to complete admission documentation at this time dt patient AMS

## 2024-02-04 NOTE — Inpatient Diabetes Management (Signed)
 Inpatient Diabetes Program Recommendations  AACE/ADA: New Consensus Statement on Inpatient Glycemic Control (2015)  Target Ranges:  Prepandial:   less than 140 mg/dL      Peak postprandial:   less than 180 mg/dL (1-2 hours)      Critically ill patients:  140 - 180 mg/dL   Lab Results  Component Value Date   GLUCAP 284 (H) 02/04/2024   HGBA1C 8.1 (H) 01/31/2024    Review of Glycemic Control  Latest Reference Range & Units 02/03/24 20:30 02/04/24 00:04 02/04/24 03:49 02/04/24 07:43 02/04/24 12:12  Glucose-Capillary 70 - 99 mg/dL 956 (H) 213 (H) 086 (H) 170 (H) 284 (H)   Diabetes history: DM  Outpatient Diabetes medications:  Lantus 10 units q AM and Lantus 5 units q PM Metformin 1000 mg bid Current orders for Inpatient glycemic control:  Novolog 0-15 units tid with meals and HS Lantus 5 units q HS  Inpatient Diabetes Program Recommendations:    Consider increasing Lantus to 10 units q HS.   Thanks,  Lorenza Cambridge, RN, BC-ADM Inpatient Diabetes Coordinator Pager [phone]  (8a-5p)

## 2024-02-05 DIAGNOSIS — E1165 Type 2 diabetes mellitus with hyperglycemia: Secondary | ICD-10-CM | POA: Diagnosis not present

## 2024-02-05 DIAGNOSIS — E785 Hyperlipidemia, unspecified: Secondary | ICD-10-CM | POA: Diagnosis not present

## 2024-02-05 DIAGNOSIS — E860 Dehydration: Secondary | ICD-10-CM | POA: Diagnosis not present

## 2024-02-05 DIAGNOSIS — N179 Acute kidney failure, unspecified: Secondary | ICD-10-CM | POA: Diagnosis not present

## 2024-02-05 LAB — COMPREHENSIVE METABOLIC PANEL
ALT: 35 U/L (ref 0–44)
AST: 30 U/L (ref 15–41)
Albumin: 2.8 g/dL — ABNORMAL LOW (ref 3.5–5.0)
Alkaline Phosphatase: 58 U/L (ref 38–126)
Anion gap: 7 (ref 5–15)
BUN: 25 mg/dL — ABNORMAL HIGH (ref 8–23)
CO2: 23 mmol/L (ref 22–32)
Calcium: 9.1 mg/dL (ref 8.9–10.3)
Chloride: 123 mmol/L — ABNORMAL HIGH (ref 98–111)
Creatinine, Ser: 1.9 mg/dL — ABNORMAL HIGH (ref 0.44–1.00)
GFR, Estimated: 28 mL/min — ABNORMAL LOW (ref >60)
Glucose, Bld: 115 mg/dL — ABNORMAL HIGH (ref 70–99)
Potassium: 3.5 mmol/L (ref 3.5–5.1)
Sodium: 153 mmol/L — ABNORMAL HIGH (ref 135–145)
Total Bilirubin: 0.7 mg/dL (ref 0.0–1.2)
Total Protein: 6.2 g/dL — ABNORMAL LOW (ref 6.5–8.1)

## 2024-02-05 LAB — GLUCOSE, CAPILLARY
Glucose-Capillary: 111 mg/dL — ABNORMAL HIGH (ref 70–99)
Glucose-Capillary: 306 mg/dL — ABNORMAL HIGH (ref 70–99)
Glucose-Capillary: 367 mg/dL — ABNORMAL HIGH (ref 70–99)
Glucose-Capillary: 314 mg/dL — ABNORMAL HIGH (ref 70–99)
Glucose-Capillary: 196 mg/dL — ABNORMAL HIGH (ref 70–99)

## 2024-02-05 LAB — CBC WITH DIFFERENTIAL/PLATELET
Abs Immature Granulocytes: 0.11 10*3/uL — ABNORMAL HIGH (ref 0.00–0.07)
Basophils Absolute: 0 10*3/uL (ref 0.0–0.1)
Basophils Relative: 0 %
Eosinophils Absolute: 0.4 10*3/uL (ref 0.0–0.5)
Eosinophils Relative: 6 %
HCT: 33.6 % — ABNORMAL LOW (ref 36.0–46.0)
Hemoglobin: 10.6 g/dL — ABNORMAL LOW (ref 12.0–15.0)
Immature Granulocytes: 2 %
Lymphocytes Relative: 30 %
Lymphs Abs: 1.8 10*3/uL (ref 0.7–4.0)
MCH: 30.7 pg (ref 26.0–34.0)
MCHC: 31.5 g/dL (ref 30.0–36.0)
MCV: 97.4 fL (ref 80.0–100.0)
Monocytes Absolute: 0.5 10*3/uL (ref 0.1–1.0)
Monocytes Relative: 8 %
Neutro Abs: 3.2 10*3/uL (ref 1.7–7.7)
Neutrophils Relative %: 54 %
Platelets: 110 10*3/uL — ABNORMAL LOW (ref 150–400)
RBC: 3.45 MIL/uL — ABNORMAL LOW (ref 3.87–5.11)
RDW: 12.1 % (ref 11.5–15.5)
WBC: 6 10*3/uL (ref 4.0–10.5)
nRBC: 0 % (ref 0.0–0.2)

## 2024-02-05 LAB — MAGNESIUM: Magnesium: 2 mg/dL (ref 1.7–2.4)

## 2024-02-05 LAB — PHOSPHORUS: Phosphorus: 2.9 mg/dL (ref 2.5–4.6)

## 2024-02-05 MED ORDER — DEXTROSE 5 % IV SOLN
INTRAVENOUS | Status: DC
Start: 2024-02-05 — End: 2024-02-06

## 2024-02-05 NOTE — TOC Initial Note (Signed)
 Transition of Care [facility]) - Initial/Assessment Note    Patient Details  Name: Mary Swanson MRN: [id number] Date of Birth: Nov 10, 1956  [facility] of [facility]) CM/SW Contact:    Talayah Picardi A [place], LCSW Phone Number: 02/05/2024, 2:49 PM  Clinical Narrative:                  CSW contacted pt's nurse, Quintella Reichert, [phone], at [facility], to discuss disposition planning and return to facility. She stated that pt was declining at the facility before she arrived to the hospital. She said that her neighbor, [name] is pt's health care advocate and that she and her brother [name] assist with pt's care because she has no family. Facility stated they are concerned pt is a higher level of care that what can be accommodated by the facility. She requested palliative consult, CSW informed provider, and will speak with family regarding facilities request. Also requested PT/OT eval.   CSW followed up with pt's neighbor/family [name], left voicemail with contact information to reach back out to CSW.   TOC will continue to follow.    Expected Discharge Plan: Assisted Living     Patient Goals and CMS Choice            Expected Discharge Plan and Services       Living arrangements for the past 2 months: Assisted Living Facility                                      Prior Living Arrangements/Services Living arrangements for the past 2 months: Assisted Living Facility Lives with:: Facility Resident          Need for Family Participation in Patient Care: Yes (Comment) Care giver support system in place?: Yes (comment) (Phyllis Cobb, nieghbor/friend)      Activities of Daily Living      Permission Sought/Granted                  Emotional Assessment   Attitude/Demeanor/Rapport: Unable to Assess Affect (typically observed): Unable to Assess Orientation: : Oriented to Self Alcohol / Substance Use: Not Applicable Psych Involvement: No (comment)  Admission  diagnosis:  Hypernatremia [E87.0] Acute kidney injury (HCC) [N17.9] Cystitis [N30.90] Acute renal failure, unspecified acute renal failure type (HCC) [N17.9] Altered mental status, unspecified altered mental status type [R41.82] Patient Active Problem List   Diagnosis Date Noted   Acute kidney injury (HCC) 01/31/2024   Palliative care encounter 01/09/2023   Type 2 diabetes mellitus with hyperglycemia, with long-term current use of insulin (HCC) 06/23/2022   Uncontrolled type 2 diabetes mellitus with hyperglycemia, with long-term current use of insulin with hyperlipidemia 01/07/2022   UTI (urinary tract infection) 06/16/2021   Alzheimer's dementia with behavioral disturbance (HCC)    Abnormal CXR    Essential hypertension    Dehydration 12/27/2019   Hyperglycemia 12/03/2019   Heart murmur, systolic 05/29/2017   Hyperlipidemia 01/24/2007   Obesity (BMI 30-39.9) 01/24/2007   Anemia 01/24/2007   History of depression 01/24/2007   RHINITIS, ALLERGIC 01/24/2007   PCP:  Patient, No Pcp Per Pharmacy:   [facility] [place], [place] - [address] [address] [place] [address] Phone: [phone] Fax: [phone]     Social Drivers of Health (SDOH) Social History: SDOH Screenings   Food Insecurity: Patient Unable To Answer (02/01/2024)  Housing: Patient Unable To Answer (02/01/2024)  Transportation Needs: Patient Unable  To Answer (02/01/2024)  Utilities: Patient Unable To Answer (02/01/2024)  Depression (PHQ2-9): Low Risk  (10/01/2019)  Social Connections: Unknown (02/01/2024)  Tobacco Use: Low Risk  (12/22/2023)   SDOH Interventions:     Readmission Risk Interventions    01/10/2022   12:44 PM  Readmission Risk Prevention Plan  Transportation Screening Complete  PCP or Specialist Appt within 3-5 Days Complete  HRI or Home Care Consult Complete  Social Work Consult for Recovery Care Planning/Counseling Complete  Palliative Care Screening  Complete  Medication Review Oceanographer) Complete

## 2024-02-05 NOTE — Progress Notes (Signed)
 Mobility Specialist: Progress Note   02/05/24 1618  Mobility  Activity Transferred from chair to bed  Level of Assistance Moderate assist, patient does 50-74%  Assistive Device Other (Comment) (HHA)  Activity Response Tolerated well  Mobility Referral Yes  Mobility visit 1 Mobility  Mobility Specialist Start Time (ACUTE ONLY) 1445  Mobility Specialist Stop Time (ACUTE ONLY) 1453  Mobility Specialist Time Calculation (min) (ACUTE ONLY) 8 min    Received pt in chair. MinA for STS, modA for transfer for physical assist + verbal cues for staying on task. Physical assistance needed to initiate movements. ModA for bed mobility. Left in bed with all needs met, call bell in reach. Bed alarm on.   Maurene Capes Mobility Specialist Please contact via SecureChat or Rehab office at [phone]

## 2024-02-05 NOTE — Progress Notes (Signed)
 Mobility Specialist: Progress Note   02/05/24 1214  Mobility  Activity Ambulated with assistance in hallway  Level of Assistance +2 (takes two people)  Press photographer wheel walker  Distance Ambulated (ft) 250 ft  Activity Response Tolerated well  Mobility Referral Yes  Mobility visit 1 Mobility  Mobility Specialist Start Time (ACUTE ONLY) 1125  Mobility Specialist Stop Time (ACUTE ONLY) 1137  Mobility Specialist Time Calculation (min) (ACUTE ONLY) 12 min    Pt expressing agreement to mobility session - received in bed. ModA for bed mobility to assist with trunk elevation and scooting with verbal and tactile cues to stay on task. ModA for STS, modA +2 for ambulation for line management (IV pole) and physical assist needed for redirection and RW steering. Physical assist needed to follow directions. Returned to room without fault. Left in chair with all needs met, call bell in reach. Chair restraint on.  Mary Swanson Mobility Specialist Please contact via SecureChat or Rehab office at [phone]

## 2024-02-05 NOTE — Progress Notes (Addendum)
 PROGRESS NOTE    Mary Swanson  ZOX:[id number] DOB: 30-May-1956 DOA: 01/31/2024 PCP: Patient, No Pcp Per   Brief Narrative:  Patient is a 68 year old African-American female with a past medical history significant for essential HTN, HLD, DMT2, history of Alzheimer's dementia and nonverbal status who resides in a memory care unit.  She is currently DNR/DNI and palliative care follows her in outpatient setting [facility].  Her facility reported of decline over the last 3 weeks and acutely worsened yesterday.  Patient has not had very good oral intake over the last week or so and has not been ambulatory for last few days.    On arrival she was noted to have significant dehydration with an elevated creatinine of 4.92 and severe hypernatremia and elevated bilirubin (since resolved). SLP evaluated and recommending a dysphagia 2 thin liquid diet for now.  Given her significant hyponatremia we have started her on D5W and sodium is slowly trending down as well as her AKI slowly. Further w/u also reveals an E Coli UTI for which she is on IV Ceftriaxone.   Assessment and Plan:  Significant Dehydration with associated Hypernatremia: Improving slowly, Na+ Trend: Recent Labs  Lab 02/03/24 1259 02/03/24 1824 02/04/24 0736 02/04/24 0923 02/04/24 2020 02/04/24 2323 02/05/24 0627  NA 156* 151* 155* 155* 152* 153* 153*  -Sodium is slowly improving with D5W; patient is continuously getting D5 @ 75 mL/hr now. TSH was 1.361.  Continue to monitor BMPs every 4 hours and try not to overcorrect.  Current rate of fluid should to decrease sodium should be 0.5 mmol/L/hr  Acute Metabolic Encephalopathy: At baseline is Non-verbal and did not verbalize anything to me today like she did a few days ago but is more alert. In the setting of above. Head CT done and showed cerebral atrophy and microvascular disease changes of supratentorial brain with no acute intracranial abnormality noted. C/w With IV fluid hydration and  treat UTI. PT/OT to Evaluate and Treat and she's Ambulating quite well. Delirium Precautions. Slowly improving since admission  Acute Kidney Injury [facility]) / Metabolic Acidosis: Improving. Nephrology was notified by EDP who suggested hydration and to let them know if she is not improving. BUN/Cr Trend slowly improving : Recent Labs  Lab 02/03/24 1259 02/03/24 1824 02/04/24 0736 02/04/24 0923 02/04/24 2020 02/04/24 2323 02/05/24 0627  BUN 44* 43* 36* 35* 33* 31* 25*  CREATININE 2.22* 2.76* 2.30* 2.37* 1.96* 1.86* 1.90*  -Metabolic Acidosis improved with a CO2 of 23, anion gap of 7, chloride level of 123 now.Avoid Nephrotoxic Medications, Contrast Dyes, Hypotension and Dehydration to Ensure Adequate Renal Perfusion and will need to Renally Adjust Meds. C/w IVF as above. CTM and Trend Renal Function carefully and repeat CMP in the AM    Type 2 Diabetes Mellitus with Hyperglycemia, with long-term current use of insulin (HCC): HbA1c was 8.1. Increase long-acting insulin to 10 units sq Daily; Change Sensitive Novolog SSI q4h to AC/HS. Continue to Hold Home Metformin. CBG's ranging from 111-306   E Coli UTI (urinary tract infection): Possibly based on UA as urinalysis showed a hazy appearance with moderate hemoglobin, moderate leukocytes, positive nitrites, many bacteria, 0-5 squamous epithelial cells and 21-50 WBCs with urine culture showing >100,000 CFU of E Coli and is Pan-sensitive so will Continue with IV Ceftriaxone 1 g every 24 hours for now and change to po closer to D/C  Macrocytic Anemia: Hgb/Hct dropping and likely was hemoconcentrated on admission and now having a dilutional drop. Hgb/Hct is now stable  at 10.6/33.6.  Anemia panel done and showed an iron level 96, UIBC of 156, TIBC 252, saturation ratio 38%, ferritin level 288, folate level 10.8, vitamin B12 562. CTM for S/Sx of Bleeding; No overt bleeding noted. Repeat CBC in the AM  Thrombocytopenia: Plt Count went from 185 on admission ->  110 and ? Dilutional drop. CTM for S/Sx of Bleeding; No overt Bleeding noted. Hold and discontinue Heparin 5,000 sq q8h for now. Repeat CBC in the AM   Dehydration: C/w Aggressive IV fluid resuscitation as above  HypoMag: Resolved. Mag is 2.0. CTM and Trend and repeat Mag Level in the AM   Hyperlipidemia: Continue Statin with Rosuvastatin 20 mg po daily   Hyperbilirubinemia: Improved. Bilirubin is now 0.7. CTM and Trend and repeat CMP in the AM  Hypoalbuminemia: Patient's Albumin is 2.8 on last check. CTM and Trend and repeat CMP in the AM    DVT prophylaxis: Discontinued Heparin 5,000 units sq q8h given drop in Platelet Count     Code Status: Limited: Do not attempt resuscitation (DNR) -DNR-LIMITED -Do Not Intubate/DNI  Family Communication: No family present at bedside  Disposition Plan:  Level of care: Telemetry Medical Status is: Inpatient Remains inpatient appropriate because: Needs further improvement in Na+    Consultants:  None  Procedures:  As delineated as above  Antimicrobials:  Anti-infectives (From admission, onward)    Start     Dose/Rate Route Frequency Ordered Stop   02/01/24 0000  cefTRIAXone (ROCEPHIN) 1 g in sodium chloride 0.9 % 100 mL IVPB        1 g 200 mL/hr over 30 Minutes Intravenous Every 24 hours 01/31/24 2227 02/04/24 2212   01/31/24 1945  cefTRIAXone (ROCEPHIN) 1 g in sodium chloride 0.9 % 100 mL IVPB        1 g 200 mL/hr over 30 Minutes Intravenous  Once 01/31/24 1938 01/31/24 2121       Subjective: Seen and examined at bedside she is more awake and alert today but does not interact or verbalize anything today.  Will renew IV fluid hydration today.  Will repeat PT OT evaluation.  No other concerns or complaints at this time.  Objective: Vitals:   02/05/24 0804 02/05/24 1306 02/05/24 1517 02/05/24 1957  BP: 135/83 (!) 121/56 (!) 132/102 (!) 174/76  Pulse: (!) 57 78 78 74  Resp: 16 16 17 18   Temp: (!) 97.5 F (36.4 C) 97.7 F (36.5 C)  (!) 97.5 F (36.4 C) 98.8 F (37.1 C)  TempSrc: Oral Oral Oral Oral  SpO2: 100% 98% 100% 100%  Weight:        Intake/Output Summary (Last 24 hours) at 02/05/2024 2323 Last data filed at 02/05/2024 1849 Gross per 24 hour  Intake 808.72 ml  Output --  Net 808.72 ml   Filed Weights   02/05/24 0606  Weight: 59.3 kg   Examination: Physical Exam:  Constitutional: Chronically ill-appearing African-American female who is more awake and alert but not as interactive and does not verbalize Respiratory: Diminished to auscultation bilaterally, no wheezing, rales, rhonchi or crackles. Normal respiratory effort and patient is not tachypenic. No accessory muscle use.  Unlabored breathing Cardiovascular: RRR, no murmurs / rubs / gallops. S1 and S2 auscultated. No extremity edema. Abdomen: Soft, non-tender, non-distended. Bowel sounds positive.  GU: Deferred. Musculoskeletal: No clubbing / cyanosis of digits/nails. No joint deformity upper and lower extremities.  Skin: No rashes, lesions, ulcers on a limited skin evaluation. No induration; Warm and dry.  Neurologic:  She is more awake and alert but not interactive and remains nonverbal and does not really follow commands Psychiatric: Appears calm  Data Reviewed: I have personally reviewed following labs and imaging studies  CBC: Recent Labs  Lab 02/01/24 0752 02/02/24 0154 02/03/24 0757 02/04/24 0736 02/05/24 0627  WBC 7.5 9.0 7.9 6.8 6.0  NEUTROABS  --  5.3 5.1 4.1 3.2  HGB 11.0* 11.0* 10.9* 10.6* 10.6*  HCT 37.1 37.1 34.7* 33.5* 33.6*  MCV 104.2* 105.1* 98.3 98.2 97.4  PLT 153 132* 136* 117* 110*   Basic Metabolic Panel: Recent Labs  Lab 02/02/24 0420 02/02/24 1331 02/03/24 0757 02/03/24 1259 02/04/24 0736 02/04/24 0923 02/04/24 2020 02/04/24 2323 02/05/24 0627  NA 161*   < > 158*   < > 155* 155* 152* 153* 153*  K 3.6   < > 3.5   < > 3.6 3.9 4.2 3.8 3.5  CL 128*   < > 126*   < > 124* 122* 120* 117* 123*  CO2 22   < > 24    < > 25 26 25 23 23   GLUCOSE 193*   < > 139*   < > 179* 251* 221* 153* 115*  BUN 73*   < > 44*   < > 36* 35* 33* 31* 25*  CREATININE 3.43*   < > 2.39*   < > 2.30* 2.37* 1.96* 1.86* 1.90*  CALCIUM 8.7*   < > 9.0   < > 8.9 9.1 9.0 9.1 9.1  MG 1.6*  --  2.1  --  1.9  --   --   --  2.0  PHOS 3.3  --  3.5  --  3.1  --   --   --  2.9   < > = values in this interval not displayed.   GFR: CrCl cannot be calculated (Unknown ideal weight.). Liver Function Tests: Recent Labs  Lab 02/01/24 0752 02/02/24 0420 02/03/24 0757 02/04/24 0736 02/05/24 0627  AST 14* 18 21 38 30  ALT 14 15 19  37 35  ALKPHOS 51 49 51 58 58  BILITOT 1.1 0.7 0.8 0.7 0.7  PROT 6.3* 6.2* 5.8* 6.2* 6.2*  ALBUMIN 3.0* 2.9* 3.0* 2.8* 2.8*   No results for input(s): "LIPASE", "AMYLASE" in the last 168 hours. Recent Labs  Lab 01/31/24 1809  AMMONIA 19   Coagulation Profile: No results for input(s): "INR", "PROTIME" in the last 168 hours. Cardiac Enzymes: No results for input(s): "CKTOTAL", "CKMB", "CKMBINDEX", "TROPONINI" in the last 168 hours. BNP (last 3 results) No results for input(s): "PROBNP" in the last 8760 hours. HbA1C: No results for input(s): "HGBA1C" in the last 72 hours. CBG: Recent Labs  Lab 02/05/24 0802 02/05/24 1249 02/05/24 1618 02/05/24 1753 02/05/24 2029  GLUCAP 111* 306* 367* 314* 196*   Lipid Profile: No results for input(s): "CHOL", "HDL", "LDLCALC", "TRIG", "CHOLHDL", "LDLDIRECT" in the last 72 hours. Thyroid Function Tests: No results for input(s): "TSH", "T4TOTAL", "FREET4", "T3FREE", "THYROIDAB" in the last 72 hours. Anemia Panel: Recent Labs    02/03/24 0757  VITAMINB12 562  FOLATE 10.9  FERRITIN 218  TIBC 252  IRON 96  RETICCTPCT 0.5   Sepsis Labs: No results for input(s): "PROCALCITON", "LATICACIDVEN" in the last 168 hours.  Recent Results (from the past 240 hours)  Urine Culture     Status: Abnormal   Collection Time: 01/31/24  5:42 PM   Specimen: Urine, Random   Result Value Ref Range Status   Specimen Description URINE, RANDOM  Final   Special Requests   Final    NONE Reflexed from [id number] Performed at [facility], [address], [place], [place] [address]    Culture >=100,000 COLONIES/mL ESCHERICHIA COLI (A)  Final   Report Status 02/04/2024 FINAL  Final   Organism ID, Bacteria ESCHERICHIA COLI (A)  Final      Susceptibility   Escherichia coli - MIC*    AMPICILLIN 8 SENSITIVE Sensitive     CEFAZOLIN <=4 SENSITIVE Sensitive     CEFEPIME <=0.12 SENSITIVE Sensitive     CEFTRIAXONE <=0.25 SENSITIVE Sensitive     CIPROFLOXACIN <=0.25 SENSITIVE Sensitive     GENTAMICIN <=1 SENSITIVE Sensitive     IMIPENEM <=0.25 SENSITIVE Sensitive     NITROFURANTOIN <=16 SENSITIVE Sensitive     TRIMETH/SULFA <=20 SENSITIVE Sensitive     AMPICILLIN/SULBACTAM <=2 SENSITIVE Sensitive     PIP/TAZO <=4 SENSITIVE Sensitive ug/mL    * >=100,000 COLONIES/mL ESCHERICHIA COLI  Resp panel by RT-PCR (RSV, Flu A&B, Covid) Anterior Nasal Swab     Status: None   Collection Time: 01/31/24  7:47 PM   Specimen: Anterior Nasal Swab  Result Value Ref Range Status   SARS Coronavirus 2 by RT PCR NEGATIVE NEGATIVE Final   Influenza A by PCR NEGATIVE NEGATIVE Final   Influenza B by PCR NEGATIVE NEGATIVE Final    Comment: (NOTE) The Xpert Xpress SARS-CoV-2/FLU/RSV plus assay is intended as an aid in the diagnosis of influenza from Nasopharyngeal swab specimens and should not be used as a sole basis for treatment. Nasal washings and aspirates are unacceptable for Xpert Xpress SARS-CoV-2/FLU/RSV testing.  Fact Sheet for Patients: BloggerCourse.com  Fact Sheet for Healthcare Providers: SeriousBroker.it  This test is not yet approved or cleared by the [place] FDA and has been authorized for detection and/or diagnosis of SARS-CoV-2 by FDA under an Emergency Use Authorization (EUA). This EUA will remain in effect  (meaning this test can be used) for the duration of the COVID-19 declaration under Section 564(b)(1) of the Act, 21 U.S.C. section 360bbb-3(b)(1), unless the authorization is terminated or revoked.     Resp Syncytial Virus by PCR NEGATIVE NEGATIVE Final    Comment: (NOTE) Fact Sheet for Patients: BloggerCourse.com  Fact Sheet for Healthcare Providers: SeriousBroker.it  This test is not yet approved or cleared by the [place] FDA and has been authorized for detection and/or diagnosis of SARS-CoV-2 by FDA under an Emergency Use Authorization (EUA). This EUA will remain in effect (meaning this test can be used) for the duration of the COVID-19 declaration under Section 564(b)(1) of the Act, 21 U.S.C. section 360bbb-3(b)(1), unless the authorization is terminated or revoked.  Performed at [facility], [address], [place], [place] [address]     Radiology Studies: No results found.  Scheduled Meds:  feeding supplement  237 mL Oral BID BM   feeding supplement (GLUCERNA SHAKE)  237 mL Oral BID BM   ferrous sulfate  300 mg Oral Daily   insulin aspart  0-15 Units Subcutaneous TID WC   insulin aspart  0-5 Units Subcutaneous QHS   insulin glargine  10 Units Subcutaneous QHS   multivitamin with minerals  1 tablet Oral Daily   rosuvastatin  20 mg Oral QHS   Continuous Infusions:  dextrose 75 mL/hr at 02/05/24 1849    LOS: 5 days   Marguerita Merles, DO [facility] Available via Epic secure chat 7am-7pm After these hours, please refer to coverage provider listed on  [email or web address] 02/05/2024, 11:23 PM

## 2024-02-06 DIAGNOSIS — N179 Acute kidney failure, unspecified: Secondary | ICD-10-CM | POA: Diagnosis not present

## 2024-02-06 LAB — BASIC METABOLIC PANEL
Anion gap: 11 (ref 5–15)
Anion gap: 10 (ref 5–15)
BUN: 17 mg/dL (ref 8–23)
BUN: 14 mg/dL (ref 8–23)
CO2: 23 mmol/L (ref 22–32)
CO2: 25 mmol/L (ref 22–32)
Calcium: 9.1 mg/dL (ref 8.9–10.3)
Calcium: 9.1 mg/dL (ref 8.9–10.3)
Chloride: 116 mmol/L — ABNORMAL HIGH (ref 98–111)
Chloride: 113 mmol/L — ABNORMAL HIGH (ref 98–111)
Creatinine, Ser: 1.49 mg/dL — ABNORMAL HIGH (ref 0.44–1.00)
Creatinine, Ser: 1.55 mg/dL — ABNORMAL HIGH (ref 0.44–1.00)
GFR, Estimated: 38 mL/min — ABNORMAL LOW (ref >60)
GFR, Estimated: 36 mL/min — ABNORMAL LOW (ref >60)
Glucose, Bld: 287 mg/dL — ABNORMAL HIGH (ref 70–99)
Glucose, Bld: 199 mg/dL — ABNORMAL HIGH (ref 70–99)
Potassium: 3.9 mmol/L (ref 3.5–5.1)
Potassium: 3.4 mmol/L — ABNORMAL LOW (ref 3.5–5.1)
Sodium: 150 mmol/L — ABNORMAL HIGH (ref 135–145)
Sodium: 148 mmol/L — ABNORMAL HIGH (ref 135–145)

## 2024-02-06 LAB — GLUCOSE, CAPILLARY
Glucose-Capillary: 179 mg/dL — ABNORMAL HIGH (ref 70–99)
Glucose-Capillary: 319 mg/dL — ABNORMAL HIGH (ref 70–99)
Glucose-Capillary: 237 mg/dL — ABNORMAL HIGH (ref 70–99)
Glucose-Capillary: 153 mg/dL — ABNORMAL HIGH (ref 70–99)
Glucose-Capillary: 174 mg/dL — ABNORMAL HIGH (ref 70–99)

## 2024-02-06 MED ORDER — HYDRALAZINE HCL 20 MG/ML IJ SOLN: 10.0000 mg | INTRAMUSCULAR | Status: DC | PRN

## 2024-02-06 MED ORDER — ONDANSETRON HCL 4 MG/2ML IJ SOLN: 4.0000 mg | Freq: Four times a day (QID) | INTRAMUSCULAR | Status: DC | PRN

## 2024-02-06 MED ORDER — HEPARIN SODIUM (PORCINE) 5000 UNIT/ML IJ SOLN
5000.0000 [IU] | Freq: Three times a day (TID) | INTRAMUSCULAR | Status: DC
  Administered 2024-02-06 – 2024-02-08 (×6): 5000 [IU] via SUBCUTANEOUS
  Filled 2024-02-06 (×6): qty 1

## 2024-02-06 MED ORDER — METOPROLOL TARTRATE 5 MG/5ML IV SOLN: 5.0000 mg | INTRAVENOUS | Status: DC | PRN

## 2024-02-06 MED ORDER — GUAIFENESIN 100 MG/5ML PO LIQD: 5.0000 mL | ORAL | Status: DC | PRN

## 2024-02-06 MED ORDER — IPRATROPIUM-ALBUTEROL 0.5-2.5 (3) MG/3ML IN SOLN: 3.0000 mL | RESPIRATORY_TRACT | Status: DC | PRN

## 2024-02-06 MED ORDER — DEXTROSE 5 % IV SOLN: INTRAVENOUS | Status: AC

## 2024-02-06 MED ORDER — ACETAMINOPHEN 325 MG PO TABS: 650.0000 mg | ORAL_TABLET | Freq: Four times a day (QID) | ORAL | Status: DC | PRN

## 2024-02-06 NOTE — Evaluation (Signed)
 Occupational Therapy Evaluation Patient Details Name: Mary Swanson MRN: [id number] DOB: Dec 04, 1955 Today's Date: 02/06/2024   History of Present Illness   68 yo admitted 3/6 with AMS, CT head with generalized atrophy. + dehydration, hypernatremia, AKI, elevated bilirubin. PMH: HTN, HLD, DM2, Alzheimer's dementia, nonverbal status     Clinical Impressions Pt is functioning at her baseline in ADLs. Dependent due to advanced cognitive impairment. Pt requires multimodal cues for mobility. Progressed OOB and to stand with min assist. Ambulated initially with one hand held and progressed to CGA in hall. Mary Swanson, in room and reports pt is near her baseline in mobility. No further OT needs. Recommend return to memory care facility.      If plan is discharge home, recommend the following:   A little help with walking and/or transfers;A lot of help with bathing/dressing/bathroom;Assistance with cooking/housework;Assistance with feeding;Direct supervision/assist for medications management;Direct supervision/assist for financial management;Assist for transportation;Help with stairs or ramp for entrance     Functional Status Assessment   Patient has not had a recent decline in their functional status     Equipment Recommendations   None recommended by OT     Recommendations for Other Services         Precautions/Restrictions   Precautions Precautions: Fall Restrictions Weight Bearing Restrictions Per Provider Order: No     Mobility Bed Mobility Overal bed mobility: Needs Assistance Bed Mobility: Supine to Sit     Supine to sit: Min assist     General bed mobility comments: pulled trunk up on therapist's hands, multimodal cues to follow commands    Transfers Overall transfer level: Needs assistance Equipment used: 2 person hand held assist Transfers: Sit to/from Stand Sit to Stand: Min assist           General transfer comment: verbal and tactile  assist to initiate standing and to rise and steady, progressed from B hand held to one hand held to [facility] with ambulation      Balance Overall balance assessment: Needs assistance   Sitting balance-Leahy Scale: Fair     Standing balance support: No upper extremity supported Standing balance-Leahy Scale: Fair Standing balance comment: progressed to CGA with ambulation                           ADL either performed or assessed with clinical judgement   ADL Overall ADL's : At baseline                                             Vision Patient Visual Report: No change from baseline       Perception         Praxis         Pertinent Vitals/Pain Pain Assessment Pain Assessment: Faces Faces Pain Scale: No hurt     Extremity/Trunk Assessment Upper Extremity Assessment Upper Extremity Assessment: Overall WFL for tasks assessed   Lower Extremity Assessment Lower Extremity Assessment: Defer to PT evaluation   Cervical / Trunk Assessment Cervical / Trunk Assessment: Normal   Communication Communication Communication: Impaired Factors Affecting Communication: Difficulty expressing self   Cognition Arousal: Alert Behavior During Therapy: Flat affect Cognition: History of cognitive impairments  Following commands: Impaired Following commands impaired:  (does not follow commands)     Cueing  General Comments   Cueing Techniques: Gestural cues;Verbal cues;Tactile cues      Exercises     Shoulder Instructions      Home Living Family/patient expects to be discharged to:: Assisted living (pt is a resident of [facility] memory care)                                        Prior Functioning/Environment Prior Level of Function : Needs assist             Mobility Comments: friend notes pt typically walks touching wall or furniture intermittently ADLs Comments: self  feeds with fingers, dependent in bathing, dressing, toileting, incontinent    OT Problem List:     OT Treatment/Interventions:        OT Goals(Current goals can be found in the care plan section)   Acute Rehab OT Goals OT Goal Formulation: All assessment and education complete, DC therapy   OT Frequency:       Co-evaluation PT/OT/SLP Co-Evaluation/Treatment: Yes Reason for Co-Treatment: Necessary to address cognition/behavior during functional activity          AM-PAC OT "6 Clicks" Daily Activity     Outcome Measure Help from another person eating meals?: A Lot Help from another person taking care of personal grooming?: Total Help from another person toileting, which includes using toliet, bedpan, or urinal?: Total Help from another person bathing (including washing, rinsing, drying)?: Total Help from another person to put on and taking off regular upper body clothing?: Total Help from another person to put on and taking off regular lower body clothing?: Total 6 Click Score: 7   End of Session Nurse Communication: Mobility status  Activity Tolerance: Patient tolerated treatment well Patient left: in chair;with call bell/phone within reach;with chair alarm set;with family/visitor present  OT Visit Diagnosis: Other symptoms and signs involving cognitive function                Time: 8295-6213 OT Time Calculation (min): 26 min Charges:  OT General Charges $OT Visit: 1 Visit OT Evaluation $OT Eval Moderate Complexity: 1 Mod  Mary Swanson, OTR/L Acute Rehabilitation Services Office: [phone]   Mary Swanson 02/06/2024, 10:56 AM

## 2024-02-06 NOTE — Plan of Care (Signed)
  Problem: Skin Integrity: Goal: Risk for impaired skin integrity will decrease Outcome: Progressing   Problem: Clinical Measurements: Goal: Will remain free from infection Outcome: Progressing Goal: Diagnostic test results will improve Outcome: Progressing   Problem: Safety: Goal: Ability to remain free from injury will improve Outcome: Progressing   Problem: Skin Integrity: Goal: Risk for impaired skin integrity will decrease Outcome: Progressing

## 2024-02-06 NOTE — Progress Notes (Signed)
 PROGRESS NOTE    Mary Swanson  ZOX:[id number] DOB: September 17, 1956 DOA: 01/31/2024 PCP: Patient, No Pcp Per    Brief Narrative:   68 year old female with HTN, HLD, DM2, Alzheimer's, nonverbal at baseline resides at memory care unit, DNR/DNI had decline over past 3 weeks with poor oral intake.  Upon admission noted to have AKI, hypernatremia.  TSH was normal.  She was started on D5 fluids with maintenance sodium levels and creatinine.  Assessment & Plan:  Principal Problem:   Acute kidney injury (HCC) Active Problems:   Hyperlipidemia   Dehydration   UTI (urinary tract infection)   Type 2 diabetes mellitus with hyperglycemia, with long-term current use of insulin (HCC)     Significant Dehydration with associated Hypernatremia: Improving slowly,  Admission sodium 172 with elevated chloride.  This has trended down with D5 fluids.  Sodium now down to 150   Acute Metabolic Encephalopathy Advance Alzheimer's dementia -Chronic memory issues exacerbated by dehydration/hyponatremia and UTI.  CT scan showed chronic cerebral atrophy with microvascular disease.  No acute abnormality.   Acute Kidney Injury (HCC) / Metabolic Acidosis: Improving.  -Admission creatinine 4.78.  This is trended down with IV fluids -Baseline 1.01 2023    Type 2 Diabetes Mellitus with Hyperglycemia, with long-term current use of insulin (HCC)  HbA1c was 8.1.-Adjust insulin as necessary   E Coli UTI (urinary tract infection) -In the setting of dehydration.  Cultures grew pansensitive E. coli.  Completed 5 days of Rocephin   Macrocytic Anemia Thrombocytopenia -Hemoglobin around baseline of 10.5 and platelets around 105     Hypomagnesemia - As needed repletion   Hyperlipidemi  Continue Statin with Rosuvastatin 20 mg po daily    Speech and swallow-dysphagia 3 diet  DVT prophylaxis: Subcu heparin    Code Status: Limited: Do not attempt resuscitation (DNR) -DNR-LIMITED -Do Not Intubate/DNI  Family  Communication:  Called Phyllis Status is: Inpatient Remains inpatient appropriate because: Continue hospital stay Awaiting    Subjective: Sitting up in the chair Nodding yes and no.    Examination:  General exam: Appears calm and comfortable  Respiratory system: Clear to auscultation. Respiratory effort normal. Cardiovascular system: S1 & S2 heard, RRR. No JVD, murmurs, rubs, gallops or clicks. No pedal edema. Gastrointestinal system: Abdomen is nondistended, soft and nontender. No organomegaly or masses felt. Normal bowel sounds heard. Central nervous system:No focal neurological deficits. Extremities: Symmetric 4 x 5 power. Skin: No rashes, lesions or ulcers Psychiatry: Judgement and insight appea poor                Diet Orders (From admission, onward)     Start     Ordered   02/04/24 1052  DIET DYS 3 Room service appropriate? No; Fluid consistency: Thin  Diet effective now       Comments: Crush meds 1:1 assist, careful hand feeding Oral care with suction 3x/day  Question Answer Comment  Room service appropriate? No   Fluid consistency: Thin      02/04/24 1051            Objective: Vitals:   02/05/24 1517 02/05/24 1957 02/06/24 0448 02/06/24 0746  BP: (!) 132/102 (!) 174/76 (!) 164/71 (!) 171/77  Pulse: 78 74 91 (!) 56  Resp: 17 18 17    Temp: (!) 97.5 F (36.4 C) 98.8 F (37.1 C) 97.9 F (36.6 C) 98.2 F (36.8 C)  TempSrc: Oral Oral Oral   SpO2: 100% 100% 100% 98%  Weight:  Intake/Output Summary (Last 24 hours) at 02/06/2024 1335 Last data filed at 02/06/2024 0322 Gross per 24 hour  Intake 851.82 ml  Output --  Net 851.82 ml   Filed Weights   02/05/24 0606  Weight: 59.3 kg    Scheduled Meds:  feeding supplement  237 mL Oral BID BM   feeding supplement (GLUCERNA SHAKE)  237 mL Oral BID BM   ferrous sulfate  300 mg Oral Daily   heparin injection (subcutaneous)  5,000 Units Subcutaneous Q8H   insulin aspart  0-15 Units  Subcutaneous TID WC   insulin aspart  0-5 Units Subcutaneous QHS   insulin glargine  10 Units Subcutaneous QHS   multivitamin with minerals  1 tablet Oral Daily   rosuvastatin  20 mg Oral QHS   Continuous Infusions:  dextrose 75 mL/hr at 02/06/24 1003    Nutritional status     Body mass index is 21.1 kg/m.  Data Reviewed:   CBC: Recent Labs  Lab 02/01/24 0752 02/02/24 0154 02/03/24 0757 02/04/24 0736 02/05/24 0627  WBC 7.5 9.0 7.9 6.8 6.0  NEUTROABS  --  5.3 5.1 4.1 3.2  HGB 11.0* 11.0* 10.9* 10.6* 10.6*  HCT 37.1 37.1 34.7* 33.5* 33.6*  MCV 104.2* 105.1* 98.3 98.2 97.4  PLT 153 132* 136* 117* 110*   Basic Metabolic Panel: Recent Labs  Lab 02/02/24 0420 02/02/24 1331 02/03/24 0757 02/03/24 1259 02/04/24 0736 02/04/24 0923 02/04/24 2020 02/04/24 2323 02/05/24 0627 02/06/24 1001  NA 161*   < > 158*   < > 155* 155* 152* 153* 153* 150*  K 3.6   < > 3.5   < > 3.6 3.9 4.2 3.8 3.5 3.9  CL 128*   < > 126*   < > 124* 122* 120* 117* 123* 116*  CO2 22   < > 24   < > 25 26 25 23 23 23   GLUCOSE 193*   < > 139*   < > 179* 251* 221* 153* 115* 287*  BUN 73*   < > 44*   < > 36* 35* 33* 31* 25* 17  CREATININE 3.43*   < > 2.39*   < > 2.30* 2.37* 1.96* 1.86* 1.90* 1.49*  CALCIUM 8.7*   < > 9.0   < > 8.9 9.1 9.0 9.1 9.1 9.1  MG 1.6*  --  2.1  --  1.9  --   --   --  2.0  --   PHOS 3.3  --  3.5  --  3.1  --   --   --  2.9  --    < > = values in this interval not displayed.   GFR: CrCl cannot be calculated (Unknown ideal weight.). Liver Function Tests: Recent Labs  Lab 02/01/24 0752 02/02/24 0420 02/03/24 0757 02/04/24 0736 02/05/24 0627  AST 14* 18 21 38 30  ALT 14 15 19  37 35  ALKPHOS 51 49 51 58 58  BILITOT 1.1 0.7 0.8 0.7 0.7  PROT 6.3* 6.2* 5.8* 6.2* 6.2*  ALBUMIN 3.0* 2.9* 3.0* 2.8* 2.8*   No results for input(s): "LIPASE", "AMYLASE" in the last 168 hours. Recent Labs  Lab 01/31/24 1809  AMMONIA 19   Coagulation Profile: No results for input(s): "INR",  "PROTIME" in the last 168 hours. Cardiac Enzymes: No results for input(s): "CKTOTAL", "CKMB", "CKMBINDEX", "TROPONINI" in the last 168 hours. BNP (last 3 results) No results for input(s): "PROBNP" in the last 8760 hours. HbA1C: No results for input(s): "HGBA1C" in the last  72 hours. CBG: Recent Labs  Lab 02/05/24 1618 02/05/24 1753 02/05/24 2029 02/06/24 0748 02/06/24 1223  GLUCAP 367* 314* 196* 179* 319*   Lipid Profile: No results for input(s): "CHOL", "HDL", "LDLCALC", "TRIG", "CHOLHDL", "LDLDIRECT" in the last 72 hours. Thyroid Function Tests: No results for input(s): "TSH", "T4TOTAL", "FREET4", "T3FREE", "THYROIDAB" in the last 72 hours. Anemia Panel: No results for input(s): "VITAMINB12", "FOLATE", "FERRITIN", "TIBC", "IRON", "RETICCTPCT" in the last 72 hours. Sepsis Labs: No results for input(s): "PROCALCITON", "LATICACIDVEN" in the last 168 hours.  Recent Results (from the past 240 hours)  Urine Culture     Status: Abnormal   Collection Time: 01/31/24  5:42 PM   Specimen: Urine, Random  Result Value Ref Range Status   Specimen Description URINE, RANDOM  Final   Special Requests   Final    NONE Reflexed from [id number] Performed at [facility], [address] [place], [place] [address]    Culture >=100,000 COLONIES/mL ESCHERICHIA COLI (A)  Final   Report Status 02/04/2024 FINAL  Final   Organism ID, Bacteria ESCHERICHIA COLI (A)  Final      Susceptibility   Escherichia coli - MIC*    AMPICILLIN 8 SENSITIVE Sensitive     CEFAZOLIN <=4 SENSITIVE Sensitive     CEFEPIME <=0.12 SENSITIVE Sensitive     CEFTRIAXONE <=0.25 SENSITIVE Sensitive     CIPROFLOXACIN <=0.25 SENSITIVE Sensitive     GENTAMICIN <=1 SENSITIVE Sensitive     IMIPENEM <=0.25 SENSITIVE Sensitive     NITROFURANTOIN <=16 SENSITIVE Sensitive     TRIMETH/SULFA <=20 SENSITIVE Sensitive     AMPICILLIN/SULBACTAM <=2 SENSITIVE Sensitive     PIP/TAZO <=4 SENSITIVE Sensitive ug/mL    * >=100,000  COLONIES/mL ESCHERICHIA COLI  Resp panel by RT-PCR (RSV, Flu A&B, Covid) Anterior Nasal Swab     Status: None   Collection Time: 01/31/24  7:47 PM   Specimen: Anterior Nasal Swab  Result Value Ref Range Status   SARS Coronavirus 2 by RT PCR NEGATIVE NEGATIVE Final   Influenza A by PCR NEGATIVE NEGATIVE Final   Influenza B by PCR NEGATIVE NEGATIVE Final    Comment: (NOTE) The Xpert Xpress SARS-CoV-2/FLU/RSV plus assay is intended as an aid in the diagnosis of influenza from Nasopharyngeal swab specimens and should not be used as a sole basis for treatment. Nasal washings and aspirates are unacceptable for Xpert Xpress SARS-CoV-2/FLU/RSV testing.  Fact Sheet for Patients: BloggerCourse.com  Fact Sheet for Healthcare Providers: SeriousBroker.it  This test is not yet approved or cleared by the [place] FDA and has been authorized for detection and/or diagnosis of SARS-CoV-2 by FDA under an Emergency Use Authorization (EUA). This EUA will remain in effect (meaning this test can be used) for the duration of the COVID-19 declaration under Section 564(b)(1) of the Act, 21 U.S.C. section 360bbb-3(b)(1), unless the authorization is terminated or revoked.     Resp Syncytial Virus by PCR NEGATIVE NEGATIVE Final    Comment: (NOTE) Fact Sheet for Patients: BloggerCourse.com  Fact Sheet for Healthcare Providers: SeriousBroker.it  This test is not yet approved or cleared by the [place] FDA and has been authorized for detection and/or diagnosis of SARS-CoV-2 by FDA under an Emergency Use Authorization (EUA). This EUA will remain in effect (meaning this test can be used) for the duration of the COVID-19 declaration under Section 564(b)(1) of the Act, 21 U.S.C. section 360bbb-3(b)(1), unless the authorization is terminated or revoked.  Performed at [facility], [address] [place], [place] [address]          Radiology Studies: No results found.         LOS: 6 days   Time spent= 35 mins    Miguel Rota, MD [facility]  If 7PM-7AM, please contact night-coverage  02/06/2024, 1:35 PM

## 2024-02-06 NOTE — Progress Notes (Signed)
 Mobility Specialist: Progress Note   02/06/24 1514  Mobility  Activity Transferred from chair to bed  Level of Assistance Minimal assist, patient does 75% or more  Assistive Device Other (Comment) (HHA)  Activity Response Tolerated well  Mobility Referral Yes  Mobility visit 1 Mobility  Mobility Specialist Start Time (ACUTE ONLY) 1410  Mobility Specialist Stop Time (ACUTE ONLY) 1420  Mobility Specialist Time Calculation (min) (ACUTE ONLY) 10 min    Returned pt back to bed - received in chair. MinA with HHA for STS and transfer. MaxA for bed mobility to assist back to supine. Left in bed with all needs met, call bell in reach. Bed alarm on.   Maurene Capes Mobility Specialist Please contact via SecureChat or Rehab office at [phone]

## 2024-02-06 NOTE — Plan of Care (Signed)

## 2024-02-06 NOTE — Evaluation (Signed)
 Physical Therapy Evaluation Patient Details Name: Mary Swanson MRN: [id number] DOB: 10-25-1956 Today's Date: 02/06/2024  History of Present Illness  68 yo admitted 3/6 with AMS, CT head with generalized atrophy. + dehydration, hypernatremia, AKI, elevated bilirubin. PMH: HTN, HLD, DM2, Alzheimer's dementia, nonverbal status  Clinical Impression  Pt at or very close to her baseline. Able to amb in the hallway with assist to initiate and then progressed to CGA Recommend return to memory care facility with assistance for mobility and ADL's as needed.        If plan is discharge home, recommend the following: A little help with walking and/or transfers;A lot of help with bathing/dressing/bathroom;Assistance with feeding;Supervision due to cognitive status   Can travel by private vehicle   Yes    Equipment Recommendations None recommended by PT  Recommendations for Other Services       Functional Status Assessment Patient has not had a recent decline in their functional status     Precautions / Restrictions Precautions Precautions: Fall Restrictions Weight Bearing Restrictions Per Provider Order: No      Mobility  Bed Mobility Overal bed mobility: Needs Assistance Bed Mobility: Supine to Sit     Supine to sit: Min assist     General bed mobility comments: pulled trunk up on therapist's hands, multimodal cues to follow commands    Transfers Overall transfer level: Needs assistance Equipment used: 2 person hand held assist Transfers: Sit to/from Stand Sit to Stand: Min assist           General transfer comment: verbal and tactile assist to initiate standing and to rise and steady,    Ambulation/Gait Ambulation/Gait assistance: Min assist, Contact guard assist Gait Distance (Feet): 200 Feet Assistive device: 1 person hand held assist, None Gait Pattern/deviations: Step-through pattern, Decreased stride length Gait velocity: decr Gait velocity interpretation:  1.31 - 2.62 ft/sec, indicative of limited community ambulator   General Gait Details: Initially 1 person hand held assist and progressed to no UE support and only CGA for safety  Stairs            Wheelchair Mobility     Tilt Bed    Modified Rankin (Stroke Patients Only)       Balance Overall balance assessment: Needs assistance Sitting-balance support: No upper extremity supported Sitting balance-Leahy Scale: Fair     Standing balance support: No upper extremity supported Standing balance-Leahy Scale: Fair Standing balance comment: progressed to CGA with ambulation                             Pertinent Vitals/Pain Pain Assessment Pain Assessment: Faces Faces Pain Scale: No hurt    Home Living Family/patient expects to be discharged to:: Assisted living (pt is a resident of [facility] memory care)                        Prior Function Prior Level of Function : Needs assist             Mobility Comments: friend notes pt typically walks touching wall or furniture intermittently ADLs Comments: self feeds with fingers, dependent in bathing, dressing, toileting, incontinent     Extremity/Trunk Assessment   Upper Extremity Assessment Upper Extremity Assessment: Defer to OT evaluation    Lower Extremity Assessment Lower Extremity Assessment: Overall WFL for tasks assessed    Cervical / Trunk Assessment Cervical / Trunk Assessment: Normal  Communication  Communication Communication: Impaired Factors Affecting Communication: Difficulty expressing self    Cognition Arousal: Alert Behavior During Therapy: Flat affect   PT - Cognitive impairments: History of cognitive impairments                         Following commands: Impaired Following commands impaired:  (does not follow commands)     Cueing Cueing Techniques: Gestural cues, Verbal cues, Tactile cues     General Comments      Exercises      Assessment/Plan    PT Assessment Patient does not need any further PT services  PT Problem List         PT Treatment Interventions      PT Goals (Current goals can be found in the Care Plan section)  Acute Rehab PT Goals PT Goal Formulation: All assessment and education complete, DC therapy    Frequency       Co-evaluation PT/OT/SLP Co-Evaluation/Treatment: Yes Reason for Co-Treatment: Necessary to address cognition/behavior during functional activity           AM-PAC PT "6 Clicks" Mobility  Outcome Measure Help needed turning from your back to your side while in a flat bed without using bedrails?: A Little Help needed moving from lying on your back to sitting on the side of a flat bed without using bedrails?: A Little Help needed moving to and from a bed to a chair (including a wheelchair)?: A Little Help needed standing up from a chair using your arms (e.g., wheelchair or bedside chair)?: A Little Help needed to walk in hospital room?: A Little Help needed climbing 3-5 steps with a railing? : A Little 6 Click Score: 18    End of Session Equipment Utilized During Treatment: Gait belt Activity Tolerance: Patient tolerated treatment well Patient left: in chair;with call bell/phone within reach;with chair alarm set;with family/visitor present Nurse Communication: Mobility status PT Visit Diagnosis: Other abnormalities of gait and mobility (R26.89);History of falling (Z91.81)    Time: 0865-7846 PT Time Calculation (min) (ACUTE ONLY): 20 min   Charges:   PT Evaluation $PT Eval Low Complexity: 1 Low   PT General Charges $$ ACUTE PT VISIT: 1 Visit         [facility] PT Acute Rehabilitation Services Office [phone]   Angelina Ok [facility] 02/06/2024, 12:16 PM

## 2024-02-07 DIAGNOSIS — N179 Acute kidney failure, unspecified: Secondary | ICD-10-CM | POA: Diagnosis not present

## 2024-02-07 LAB — BASIC METABOLIC PANEL
Anion gap: 12 (ref 5–15)
BUN: 12 mg/dL (ref 8–23)
CO2: 21 mmol/L — ABNORMAL LOW (ref 22–32)
Calcium: 9.1 mg/dL (ref 8.9–10.3)
Chloride: 114 mmol/L — ABNORMAL HIGH (ref 98–111)
Creatinine, Ser: 1.18 mg/dL — ABNORMAL HIGH (ref 0.44–1.00)
GFR, Estimated: 50 mL/min — ABNORMAL LOW (ref >60)
Glucose, Bld: 215 mg/dL — ABNORMAL HIGH (ref 70–99)
Potassium: 3.9 mmol/L (ref 3.5–5.1)
Sodium: 147 mmol/L — ABNORMAL HIGH (ref 135–145)

## 2024-02-07 LAB — CBC
HCT: 32.6 % — ABNORMAL LOW (ref 36.0–46.0)
Hemoglobin: 10.5 g/dL — ABNORMAL LOW (ref 12.0–15.0)
MCH: 30.9 pg (ref 26.0–34.0)
MCHC: 32.2 g/dL (ref 30.0–36.0)
MCV: 95.9 fL (ref 80.0–100.0)
Platelets: 125 10*3/uL — ABNORMAL LOW (ref 150–400)
RBC: 3.4 MIL/uL — ABNORMAL LOW (ref 3.87–5.11)
RDW: 12.5 % (ref 11.5–15.5)
WBC: 5 10*3/uL (ref 4.0–10.5)
nRBC: 0.4 % — ABNORMAL HIGH (ref 0.0–0.2)

## 2024-02-07 LAB — GLUCOSE, CAPILLARY
Glucose-Capillary: 169 mg/dL — ABNORMAL HIGH (ref 70–99)
Glucose-Capillary: 195 mg/dL — ABNORMAL HIGH (ref 70–99)
Glucose-Capillary: 321 mg/dL — ABNORMAL HIGH (ref 70–99)
Glucose-Capillary: 277 mg/dL — ABNORMAL HIGH (ref 70–99)
Glucose-Capillary: 153 mg/dL — ABNORMAL HIGH (ref 70–99)

## 2024-02-07 LAB — MAGNESIUM: Magnesium: 1.8 mg/dL (ref 1.7–2.4)

## 2024-02-07 LAB — PHOSPHORUS: Phosphorus: 3.2 mg/dL (ref 2.5–4.6)

## 2024-02-07 MED ORDER — POTASSIUM CHLORIDE CRYS ER 20 MEQ PO TBCR
40.0000 meq | EXTENDED_RELEASE_TABLET | Freq: Once | ORAL | Status: AC
  Administered 2024-02-07: 40 meq via ORAL
  Filled 2024-02-07: qty 2

## 2024-02-07 MED ORDER — LORAZEPAM 0.5 MG PO TABS: 0.5000 mg | ORAL_TABLET | Freq: Two times a day (BID) | ORAL | 0 refills | Status: AC

## 2024-02-07 MED ORDER — DEXTROSE 5 % IV SOLN: INTRAVENOUS | Status: AC

## 2024-02-07 NOTE — Plan of Care (Signed)

## 2024-02-07 NOTE — TOC Progression Note (Signed)
 Transition of Care [facility]) - Progression Note    Patient Details  Name: Mary Swanson MRN: [id number] Date of Birth: 1956/02/27  [facility] of [facility]) CM/SW Contact  Tiffinie Caillier A [place], LCSW Phone Number: 02/07/2024, 2:32 PM  Clinical Narrative:     CSW was contacted by pt's nurse at [facility], she said pt is much improved and as her baseline. She said that pt could return to facility tomorrow. Provider notified. EDD tomorrow.    TOC will continue to follow.   Expected Discharge Plan: Assisted Living    Expected Discharge Plan and Services       Living arrangements for the past 2 months: Assisted Living Facility                                       Social Determinants of Health (SDOH) Interventions SDOH Screenings   Food Insecurity: Patient Unable To Answer (02/01/2024)  Housing: Patient Unable To Answer (02/01/2024)  Transportation Needs: Patient Unable To Answer (02/01/2024)  Utilities: Patient Unable To Answer (02/01/2024)  Depression (PHQ2-9): Low Risk  (10/01/2019)  Social Connections: Unknown (02/01/2024)  Tobacco Use: Low Risk  (12/22/2023)    Readmission Risk Interventions    01/10/2022   12:44 PM  Readmission Risk Prevention Plan  Transportation Screening Complete  PCP or Specialist Appt within 3-5 Days Complete  HRI or Home Care Consult Complete  Social Work Consult for Recovery Care Planning/Counseling Complete  Palliative Care Screening Complete  Medication Review Oceanographer) Complete

## 2024-02-07 NOTE — Inpatient Diabetes Management (Signed)
 Inpatient Diabetes Program Recommendations  AACE/ADA: New Consensus Statement on Inpatient Glycemic Control (2015)  Target Ranges:  Prepandial:   less than 140 mg/dL      Peak postprandial:   less than 180 mg/dL (1-2 hours)      Critically ill patients:  140 - 180 mg/dL   Lab Results  Component Value Date   GLUCAP 321 (H) 02/07/2024   HGBA1C 8.1 (H) 01/31/2024    Review of Glycemic Control  Latest Reference Range & Units 02/06/24 19:36 02/06/24 23:18 02/07/24 04:11 02/07/24 08:08 02/07/24 12:38  Glucose-Capillary 70 - 99 mg/dL 161 (H) 096 (H) 045 (H) 195 (H) 321 (H)   Diabetes history: DM 2 Outpatient Diabetes medications:  Lantus 10 units q AM and Lantus 5 units q HS Metformin 1000 mg bid Current orders for Inpatient glycemic control:  Novolog 0-15 units tid with meals and HS Lantus 10 units daily Inpatient Diabetes Program Recommendations:    Consider adding Novolog 2 units tid with meals (hold if patient eats less than 50% or NPO).    Thanks,  Lorenza Cambridge, RN, BC-ADM Inpatient Diabetes Coordinator Pager [phone]  (8a-5p)

## 2024-02-07 NOTE — Progress Notes (Signed)
 Mobility Specialist: Progress Note   02/07/24 1613  Mobility  Activity Transferred from chair to bed  Level of Assistance Moderate assist, patient does 50-74%  Assistive Device Other (Comment) (HHA)  Distance Ambulated (ft) 10 ft  Activity Response Tolerated fair  Mobility Referral Yes  Mobility visit 1 Mobility  Mobility Specialist Start Time (ACUTE ONLY) 1416  Mobility Specialist Stop Time (ACUTE ONLY) 1436  Mobility Specialist Time Calculation (min) (ACUTE ONLY) 20 min    Received pt in chair. ModA for physical assistance, peri care, and cues for redirection and staying on task. Left in bed with all needs met, call bell in reach. Bed alarm on.   Maurene Capes Mobility Specialist Please contact via SecureChat or Rehab office at [phone]

## 2024-02-07 NOTE — Progress Notes (Signed)
 PROGRESS NOTE    Mary Swanson  WUJ:[id number] DOB: 21-Jun-1956 DOA: 01/31/2024 PCP: Patient, No Pcp Per    Brief Narrative:   68 year old female with HTN, HLD, DM2, Alzheimer's, nonverbal at baseline resides at memory care unit, DNR/DNI had decline over past 3 weeks with poor oral intake.  Upon admission noted to have AKI, hypernatremia.  TSH was normal.  She was started on D5 fluids with maintenance sodium levels and creatinine.  Eventually electrolytes improved and renal function stabilized.  Patient also completed 5 days of IV Rocephin for urinary tract infection in the setting of dehydration.  She remains medically stable to be discharged. Unfortunately given her advanced dementia and possibility of future dehydration, she is at high risk for readmission.  Have communicated this with patient's healthcare power of attorney, [name].  Assessment & Plan:  Principal Problem:   Acute kidney injury (HCC) Active Problems:   Hyperlipidemia   Dehydration   UTI (urinary tract infection)   Type 2 diabetes mellitus with hyperglycemia, with long-term current use of insulin (HCC)     Significant Dehydration with associated Hypernatremia: Improving slowly,  Admission sodium 172 with elevated chloride.  This has trended down with D5 fluids.  Sodium now down to 150   Acute Metabolic Encephalopathy Advance Alzheimer's dementia -Chronic memory issues exacerbated by dehydration/hyponatremia and UTI.  CT scan showed chronic cerebral atrophy with microvascular disease.  No acute abnormality.   Acute Kidney Injury (HCC) / Metabolic Acidosis: Improving.  -Admission creatinine 4.78.  This is trended down with IV fluids -Baseline 1.01 2023    Type 2 Diabetes Mellitus with Hyperglycemia, with long-term current use of insulin (HCC)  HbA1c was 8.1.-Adjust insulin as necessary   E Coli UTI (urinary tract infection) -In the setting of dehydration.  Cultures grew pansensitive E. coli.  Completed 5 days of  Rocephin   Macrocytic Anemia Thrombocytopenia -Hemoglobin around baseline of 10.5 and platelets around 105     Hypomagnesemia - As needed repletion   Hyperlipidemi  Continue Statin with Rosuvastatin 20 mg po daily    Speech and swallow-dysphagia 3 diet NO INDICATION FOR TELEMETRY.   DVT prophylaxis: Subcu heparin    Code Status: Limited: Do not attempt resuscitation (DNR) -DNR-LIMITED -Do Not Intubate/DNI  Family Communication:  Called Phyllis Status is: Inpatient Remains inpatient appropriate because: DISCHARGE WHEN SAFE DISPO AVAILABLE.    Subjective: Sitting on recliner no complaints  Examination:  General exam: Appears calm and comfortable  Respiratory system: Clear to auscultation. Respiratory effort normal. Cardiovascular system: S1 & S2 heard, RRR. No JVD, murmurs, rubs, gallops or clicks. No pedal edema. Gastrointestinal system: Abdomen is nondistended, soft and nontender. No organomegaly or masses felt. Normal bowel sounds heard. Central nervous system:No focal neurological deficits. Extremities: Symmetric 4 x 5 power. Skin: No rashes, lesions or ulcers Psychiatry: Judgement and insight appea poor                Diet Orders (From admission, onward)     Start     Ordered   02/04/24 1052  DIET DYS 3 Room service appropriate? No; Fluid consistency: Thin  Diet effective now       Comments: Crush meds 1:1 assist, careful hand feeding Oral care with suction 3x/day  Question Answer Comment  Room service appropriate? No   Fluid consistency: Thin      02/04/24 1051            Objective: Vitals:   02/06/24 1938 02/06/24 2323 02/07/24 0410 02/07/24 9562  BP: (!) 157/76 (!) 169/71 (!) 144/94 (!) 142/41  Pulse: 62 63 (!) 55 (!) 53  Resp: 19 19 19 18   Temp: (!) 97.4 F (36.3 C) 97.9 F (36.6 C) 98.6 F (37 C) 98.4 F (36.9 C)  TempSrc: Oral Oral Oral Oral  SpO2: 100% 100% 100% 100%  Weight:        Intake/Output Summary (Last 24 hours) at  02/07/2024 1120 Last data filed at 02/07/2024 1113 Gross per 24 hour  Intake 785.56 ml  Output 500 ml  Net 285.56 ml   Filed Weights   02/05/24 0606  Weight: 59.3 kg    Scheduled Meds:  feeding supplement  237 mL Oral BID BM   feeding supplement (GLUCERNA SHAKE)  237 mL Oral BID BM   ferrous sulfate  300 mg Oral Daily   heparin injection (subcutaneous)  5,000 Units Subcutaneous Q8H   insulin aspart  0-15 Units Subcutaneous TID WC   insulin aspart  0-5 Units Subcutaneous QHS   insulin glargine  10 Units Subcutaneous QHS   multivitamin with minerals  1 tablet Oral Daily   rosuvastatin  20 mg Oral QHS   Continuous Infusions:  Nutritional status     Body mass index is 21.1 kg/m.  Data Reviewed:   CBC: Recent Labs  Lab 02/02/24 0154 02/03/24 0757 02/04/24 0736 02/05/24 0627 02/07/24 0757  WBC 9.0 7.9 6.8 6.0 5.0  NEUTROABS 5.3 5.1 4.1 3.2  --   HGB 11.0* 10.9* 10.6* 10.6* 10.5*  HCT 37.1 34.7* 33.5* 33.6* 32.6*  MCV 105.1* 98.3 98.2 97.4 95.9  PLT 132* 136* 117* 110* 125*   Basic Metabolic Panel: Recent Labs  Lab 02/02/24 0420 02/02/24 1331 02/03/24 0757 02/03/24 1259 02/04/24 0736 02/04/24 0923 02/04/24 2323 02/05/24 0627 02/06/24 1001 02/06/24 2104 02/07/24 0757 02/07/24 0908  NA 161*   < > 158*   < > 155*   < > 153* 153* 150* 148*  --  147*  K 3.6   < > 3.5   < > 3.6   < > 3.8 3.5 3.9 3.4*  --  3.9  CL 128*   < > 126*   < > 124*   < > 117* 123* 116* 113*  --  114*  CO2 22   < > 24   < > 25   < > 23 23 23 25   --  21*  GLUCOSE 193*   < > 139*   < > 179*   < > 153* 115* 287* 199*  --  215*  BUN 73*   < > 44*   < > 36*   < > 31* 25* 17 14  --  12  CREATININE 3.43*   < > 2.39*   < > 2.30*   < > 1.86* 1.90* 1.49* 1.55*  --  1.18*  CALCIUM 8.7*   < > 9.0   < > 8.9   < > 9.1 9.1 9.1 9.1  --  9.1  MG 1.6*  --  2.1  --  1.9  --   --  2.0  --   --  1.8  --   PHOS 3.3  --  3.5  --  3.1  --   --  2.9  --   --  3.2  --    < > = values in this interval not  displayed.   GFR: CrCl cannot be calculated (Unknown ideal weight.). Liver Function Tests: Recent Labs  Lab 02/01/24 [phone] 02/02/24 0420  02/03/24 0757 02/04/24 0736 02/05/24 0627  AST 14* 18 21 38 30  ALT 14 15 19  37 35  ALKPHOS 51 49 51 58 58  BILITOT 1.1 0.7 0.8 0.7 0.7  PROT 6.3* 6.2* 5.8* 6.2* 6.2*  ALBUMIN 3.0* 2.9* 3.0* 2.8* 2.8*   No results for input(s): "LIPASE", "AMYLASE" in the last 168 hours. Recent Labs  Lab 01/31/24 1809  AMMONIA 19   Coagulation Profile: No results for input(s): "INR", "PROTIME" in the last 168 hours. Cardiac Enzymes: No results for input(s): "CKTOTAL", "CKMB", "CKMBINDEX", "TROPONINI" in the last 168 hours. BNP (last 3 results) No results for input(s): "PROBNP" in the last 8760 hours. HbA1C: No results for input(s): "HGBA1C" in the last 72 hours. CBG: Recent Labs  Lab 02/06/24 1557 02/06/24 1936 02/06/24 2318 02/07/24 0411 02/07/24 0808  GLUCAP 237* 153* 174* 169* 195*   Lipid Profile: No results for input(s): "CHOL", "HDL", "LDLCALC", "TRIG", "CHOLHDL", "LDLDIRECT" in the last 72 hours. Thyroid Function Tests: No results for input(s): "TSH", "T4TOTAL", "FREET4", "T3FREE", "THYROIDAB" in the last 72 hours. Anemia Panel: No results for input(s): "VITAMINB12", "FOLATE", "FERRITIN", "TIBC", "IRON", "RETICCTPCT" in the last 72 hours. Sepsis Labs: No results for input(s): "PROCALCITON", "LATICACIDVEN" in the last 168 hours.  Recent Results (from the past 240 hours)  Urine Culture     Status: Abnormal   Collection Time: 01/31/24  5:42 PM   Specimen: Urine, Random  Result Value Ref Range Status   Specimen Description URINE, RANDOM  Final   Special Requests   Final    NONE Reflexed from [id number] Performed at [facility], [address] [place], [place] [address]    Culture >=100,000 COLONIES/mL ESCHERICHIA COLI (A)  Final   Report Status 02/04/2024 FINAL  Final   Organism ID, Bacteria ESCHERICHIA COLI (A)  Final       Susceptibility   Escherichia coli - MIC*    AMPICILLIN 8 SENSITIVE Sensitive     CEFAZOLIN <=4 SENSITIVE Sensitive     CEFEPIME <=0.12 SENSITIVE Sensitive     CEFTRIAXONE <=0.25 SENSITIVE Sensitive     CIPROFLOXACIN <=0.25 SENSITIVE Sensitive     GENTAMICIN <=1 SENSITIVE Sensitive     IMIPENEM <=0.25 SENSITIVE Sensitive     NITROFURANTOIN <=16 SENSITIVE Sensitive     TRIMETH/SULFA <=20 SENSITIVE Sensitive     AMPICILLIN/SULBACTAM <=2 SENSITIVE Sensitive     PIP/TAZO <=4 SENSITIVE Sensitive ug/mL    * >=100,000 COLONIES/mL ESCHERICHIA COLI  Resp panel by RT-PCR (RSV, Flu A&B, Covid) Anterior Nasal Swab     Status: None   Collection Time: 01/31/24  7:47 PM   Specimen: Anterior Nasal Swab  Result Value Ref Range Status   SARS Coronavirus 2 by RT PCR NEGATIVE NEGATIVE Final   Influenza A by PCR NEGATIVE NEGATIVE Final   Influenza B by PCR NEGATIVE NEGATIVE Final    Comment: (NOTE) The Xpert Xpress SARS-CoV-2/FLU/RSV plus assay is intended as an aid in the diagnosis of influenza from Nasopharyngeal swab specimens and should not be used as a sole basis for treatment. Nasal washings and aspirates are unacceptable for Xpert Xpress SARS-CoV-2/FLU/RSV testing.  Fact Sheet for Patients: BloggerCourse.com  Fact Sheet for Healthcare Providers: SeriousBroker.it  This test is not yet approved or cleared by the [place] FDA and has been authorized for detection and/or diagnosis of SARS-CoV-2 by FDA under an Emergency Use Authorization (EUA). This EUA will remain in effect (meaning this test can be used) for the duration of the COVID-19 declaration  under Section 564(b)(1) of the Act, 21 U.S.C. section 360bbb-3(b)(1), unless the authorization is terminated or revoked.     Resp Syncytial Virus by PCR NEGATIVE NEGATIVE Final    Comment: (NOTE) Fact Sheet for Patients: BloggerCourse.com  Fact Sheet for Healthcare  Providers: SeriousBroker.it  This test is not yet approved or cleared by the [place] FDA and has been authorized for detection and/or diagnosis of SARS-CoV-2 by FDA under an Emergency Use Authorization (EUA). This EUA will remain in effect (meaning this test can be used) for the duration of the COVID-19 declaration under Section 564(b)(1) of the Act, 21 U.S.C. section 360bbb-3(b)(1), unless the authorization is terminated or revoked.  Performed at [facility], [address], [place], [place] [address]          Radiology Studies: No results found.         LOS: 7 days   Time spent= 35 mins    Miguel Rota, MD [facility]  If 7PM-7AM, please contact night-coverage  02/07/2024, 11:20 AM

## 2024-02-07 NOTE — Progress Notes (Addendum)
 Mobility Specialist: Progress Note   02/07/24 1125  Mobility  Activity Ambulated with assistance in hallway  Level of Assistance Minimal assist, patient does 75% or more  Assistive Device None  Distance Ambulated (ft) 350 ft  Activity Response Tolerated well  Mobility Referral Yes  Mobility visit 1 Mobility  Mobility Specialist Start Time (ACUTE ONLY) 1000  Mobility Specialist Stop Time (ACUTE ONLY) 1015  Mobility Specialist Time Calculation (min) (ACUTE ONLY) 15 min    Received pt in bed. ModA to initiate all movement but not much physical assistance needed once inititated. MinA for STS. MinA for ambulation to steady. Returned to room without fault. Left in chair with all needs met, call bell in reach. Chair restraint on.   Maurene Capes Mobility Specialist Please contact via SecureChat or Rehab office at [phone]

## 2024-02-07 NOTE — Plan of Care (Signed)
  Problem: Skin Integrity: Goal: Risk for impaired skin integrity will decrease Outcome: Progressing   Problem: Tissue Perfusion: Goal: Adequacy of tissue perfusion will improve Outcome: Progressing   Problem: Activity: Goal: Risk for activity intolerance will decrease Outcome: Progressing   Problem: Nutrition: Goal: Adequate nutrition will be maintained Outcome: Progressing   Problem: Safety: Goal: Ability to remain free from injury will improve Outcome: Progressing   Problem: Skin Integrity: Goal: Risk for impaired skin integrity will decrease Outcome: Progressing

## 2024-02-08 DIAGNOSIS — N179 Acute kidney failure, unspecified: Secondary | ICD-10-CM | POA: Diagnosis not present

## 2024-02-08 LAB — CBC
HCT: 32.6 % — ABNORMAL LOW (ref 36.0–46.0)
Hemoglobin: 10.5 g/dL — ABNORMAL LOW (ref 12.0–15.0)
MCH: 31 pg (ref 26.0–34.0)
MCHC: 32.2 g/dL (ref 30.0–36.0)
MCV: 96.2 fL (ref 80.0–100.0)
Platelets: UNDETERMINED 10*3/uL (ref 150–400)
RBC: 3.39 MIL/uL — ABNORMAL LOW (ref 3.87–5.11)
RDW: 12.7 % (ref 11.5–15.5)
WBC: 5.8 10*3/uL (ref 4.0–10.5)
nRBC: 0 % (ref 0.0–0.2)

## 2024-02-08 LAB — GLUCOSE, CAPILLARY
Glucose-Capillary: 234 mg/dL — ABNORMAL HIGH (ref 70–99)
Glucose-Capillary: 348 mg/dL — ABNORMAL HIGH (ref 70–99)

## 2024-02-08 LAB — BASIC METABOLIC PANEL
Anion gap: 9 (ref 5–15)
BUN: 9 mg/dL (ref 8–23)
CO2: 24 mmol/L (ref 22–32)
Calcium: 8.9 mg/dL (ref 8.9–10.3)
Chloride: 110 mmol/L (ref 98–111)
Creatinine, Ser: 1.27 mg/dL — ABNORMAL HIGH (ref 0.44–1.00)
GFR, Estimated: 46 mL/min — ABNORMAL LOW (ref >60)
Glucose, Bld: 246 mg/dL — ABNORMAL HIGH (ref 70–99)
Potassium: 3.7 mmol/L (ref 3.5–5.1)
Sodium: 143 mmol/L (ref 135–145)

## 2024-02-08 LAB — MAGNESIUM: Magnesium: 1.7 mg/dL (ref 1.7–2.4)

## 2024-02-08 NOTE — Progress Notes (Signed)
 Pt going back to [facility], family aware, he was visiting this am , report called to [place] ,med tech .

## 2024-02-08 NOTE — NC FL2 (Signed)
 [place] MEDICAID FL2 LEVEL OF CARE FORM     IDENTIFICATION  Patient Name: Mary Swanson Birthdate: August 29, 1956 Sex: female Admission Date (Current Location): 01/31/2024  [facility] and [place] Number:  Producer, television/film/video and Address:  [facility], [address], [place], [place] [address]      Provider Number: [id number]  Attending Physician Name and Address:  Miguel Rota, MD  Relative Name and Phone Number:  [name] [facility])  [phone]    Current Level of Care: Hospital Recommended Level of Care: Memory Care Prior Approval Number:    Date Approved/Denied:   PASRR Number:    Discharge Plan: Other (Comment) (Memory care)    Current Diagnoses: Patient Active Problem List   Diagnosis Date Noted   Acute kidney injury (HCC) 01/31/2024   Palliative care encounter 01/09/2023   Type 2 diabetes mellitus with hyperglycemia, with long-term current use of insulin (HCC) 06/23/2022   Uncontrolled type 2 diabetes mellitus with hyperglycemia, with long-term current use of insulin with hyperlipidemia 01/07/2022   UTI (urinary tract infection) 06/16/2021   Alzheimer's dementia with behavioral disturbance (HCC)    Abnormal CXR    Essential hypertension    Dehydration 12/27/2019   Hyperglycemia 12/03/2019   Heart murmur, systolic 05/29/2017   Hyperlipidemia 01/24/2007   Obesity (BMI 30-39.9) 01/24/2007   Anemia 01/24/2007   History of depression 01/24/2007   RHINITIS, ALLERGIC 01/24/2007    Orientation RESPIRATION BLADDER Height & Weight     Self  Normal Incontinent Weight: 130 lb 11.7 oz (59.3 kg) Height:     BEHAVIORAL SYMPTOMS/MOOD NEUROLOGICAL BOWEL NUTRITION STATUS      Incontinent Diet (Dysphagia 3 Diet.)  AMBULATORY STATUS COMMUNICATION OF NEEDS Skin   Limited Assist Non-Verbally Normal                       Personal Care Assistance Level of Assistance  Bathing, Feeding, Dressing Bathing Assistance: Limited  assistance Feeding assistance: Limited assistance Dressing Assistance: Limited assistance     Functional Limitations Info  Sight, Hearing, Speech Sight Info: Impaired Hearing Info: Adequate Speech Info: Impaired    SPECIAL CARE FACTORS FREQUENCY                       Contractures Contractures Info: Not present    Additional Factors Info  Code Status, Allergies Code Status Info: DNR Allergies Info: Erythromycin  Latex  Lisinopril           Current Medications (02/08/2024):  This is the current hospital active medication list Current Facility-Administered Medications  Medication Dose Route Frequency Provider Last Rate Last Admin   acetaminophen (TYLENOL) tablet 650 mg  650 mg Oral Q6H PRN Amin, Ankit C, MD       feeding supplement (ENSURE ENLIVE / ENSURE PLUS) liquid 237 mL  237 mL Oral BID BM Sheikh, Omair Latif, DO   237 mL at 02/03/24 1525   feeding supplement (GLUCERNA SHAKE) (GLUCERNA SHAKE) liquid 237 mL  237 mL Oral BID BM Sheikh, Omair Latif, DO   237 mL at 02/08/24 1038   fentaNYL (SUBLIMAZE) injection 12.5-50 mcg  12.5-50 mcg Intravenous Q2H PRN Reva Bores, MD       ferrous sulfate 300 (60 Fe) MG/5ML syrup 300 mg  300 mg Oral Daily Sheikh, Omair Latif, DO   300 mg at 02/08/24 1034   guaiFENesin (ROBITUSSIN) 100 MG/5ML liquid 5 mL  5 mL Oral Q4H PRN Amin, Ankit  C, MD       heparin injection 5,000 Units  5,000 Units Subcutaneous Q8H Amin, Ankit C, MD   5,000 Units at 02/08/24 0544   hydrALAZINE (APRESOLINE) injection 10 mg  10 mg Intravenous Q4H PRN Amin, Ankit C, MD       insulin aspart (novoLOG) injection 0-15 Units  0-15 Units Subcutaneous TID WC Marguerita Merles [place], DO   11 Units at 02/08/24 1222   insulin aspart (novoLOG) injection 0-5 Units  0-5 Units Subcutaneous QHS Marguerita Merles [place], DO   3 Units at 02/04/24 2143   insulin glargine (LANTUS) injection 10 Units  10 Units Subcutaneous QHS Marguerita Merles [place], DO   10 Units at 02/07/24 2155    ipratropium-albuterol (DUONEB) 0.5-2.5 (3) MG/3ML nebulizer solution 3 mL  3 mL Nebulization Q4H PRN Amin, Ankit C, MD       metoprolol tartrate (LOPRESSOR) injection 5 mg  5 mg Intravenous Q4H PRN Amin, Ankit C, MD       multivitamin with minerals tablet 1 tablet  1 tablet Oral Daily Marguerita Merles [place], DO   1 tablet at 02/08/24 1034   ondansetron (ZOFRAN) injection 4 mg  4 mg Intravenous Q6H PRN Amin, Ankit C, MD       ondansetron (ZOFRAN) tablet 4 mg  4 mg Oral Q6H PRN Reva Bores, MD       Oral care mouth rinse  15 mL Mouth Rinse PRN Reva Bores, MD       rosuvastatin (CRESTOR) tablet 20 mg  20 mg Oral QHS Sheikh, Kateri [place], DO   20 mg at 02/07/24 2152     Discharge Medications: Medication List       TAKE these medications     acetaminophen 325 MG tablet Commonly known as: TYLENOL Take 650 mg by mouth every 8 (eight) hours as needed for moderate pain, fever or headache.    AgaMatrix Presto Test test strip Generic drug: glucose blood Check blood glucose 3 times daily before meals and as needed    AgaMatrix Presto w/Device Kit Check blood glucose three times daily before meals and as needed    AgaMatrix Ultra-Thin Lancets Misc Check blood glucose 3 times daily before meals and as needed    amLODipine 10 MG tablet Commonly known as: NORVASC Take 1 tablet (10 mg total) by mouth daily.    eucerin lotion Apply 1 Application topically daily. To bilateral feet    feeding supplement (GLUCERNA SHAKE) Liqd Take 237 mLs by mouth as directed. Between meals    ferrous sulfate 220 (44 Fe) MG/5ML solution Take 7.5 mLs by mouth daily.    Insulin Pen Needle 32G X 4 MM Misc Commonly known as: BD Pen Needle Nano U/F 1 Stick by Does not apply route every morning.    Lantus SoloStar 100 UNIT/ML Solostar Pen Generic drug: insulin glargine Inject 18 Units into the skin daily. What changed:  how much to take when to take this additional instructions    LORazepam 0.5 MG  tablet Commonly known as: ATIVAN Take 1 tablet (0.5 mg total) by mouth in the morning and at bedtime. What changed: when to take this    metFORMIN 1000 MG tablet Commonly known as: GLUCOPHAGE Take 1,000 mg by mouth 2 (two) times daily.    multivitamin with minerals Tabs tablet Take 1 tablet by mouth daily.    rosuvastatin 20 MG tablet Commonly known as: CRESTOR Take 20 mg by mouth at bedtime.    Relevant Imaging  Results:  Relevant Lab Results:   Additional Information SSN:[id number]  Makaveli Hoard A [place], LCSW

## 2024-02-08 NOTE — Progress Notes (Signed)
 Pt left to facility via [facility] ambulance sevice, vss , no change in assessment. Large BM noted .Pt cleaned up , clean panties on with peripad .

## 2024-02-08 NOTE — Progress Notes (Signed)
 Patient does not have a legal guardian  Documention necessary to print AVS

## 2024-02-08 NOTE — Discharge Summary (Signed)
 Physician Discharge Summary  Mary Swanson ZOX:[id number] DOB: 25-May-1956 DOA: 01/31/2024  PCP: Patient, No Pcp Per  Admit date: 01/31/2024 Discharge date: 02/08/2024  Admitted From: LTC Disposition:  LTC  Recommendations for Outpatient Follow-up:  Follow up with PCP in 1-2 weeks Please obtain BMP/CBC in one week your next doctors visit.  Palliative Care should continue to follow this patient as outpatient.  Encourage PO intake as tolerated.   High risk of readmission due to occasional Poor Oral Intake.    Discharge Condition: Stable CODE STATUS: DNR Diet recommendation: Dysphagia 3 Diet.   Brief/Interim Summary: Brief Narrative:   68 year old female with HTN, HLD, DM2, Alzheimer's, nonverbal at baseline resides at memory care unit, DNR/DNI had decline over past 3 weeks with poor oral intake.  Upon admission noted to have AKI, hypernatremia.  TSH was normal.  She was started on D5 fluids with maintenance sodium levels and creatinine.  Eventually electrolytes improved and renal function stabilized.  Patient also completed 5 days of IV Rocephin for urinary tract infection in the setting of dehydration.  She remains medically stable to be discharged. Unfortunately given her advanced dementia and possibility of future dehydration, she is at high risk for readmission.  Have communicated this with patient's healthcare power of attorney, Mary Swanson.  Hopefully can return back to her facility today  Assessment & Plan:  Principal Problem:   Acute kidney injury (HCC) Active Problems:   Hyperlipidemia   Dehydration   UTI (urinary tract infection)   Type 2 diabetes mellitus with hyperglycemia, with long-term current use of insulin (HCC)     Significant Dehydration with associated Hypernatremia: Improving slowly,  Admission sodium 172 with elevated chloride.  Improved with D5 fluids.   Acute Metabolic Encephalopathy Advance Alzheimer's dementia -Chronic memory issues exacerbated by  dehydration/hyponatremia and UTI.  CT scan showed chronic cerebral atrophy with microvascular disease.  No acute abnormality.   Acute Kidney Injury (HCC) / Metabolic Acidosis: Improving.  -Admission creatinine 4.78.  This is trended down with IV fluids -Baseline 1.01 2023    Type 2 Diabetes Mellitus with Hyperglycemia, with long-term current use of insulin (HCC)  HbA1c was 8.1.-Adjust insulin as necessary   E Coli UTI (urinary tract infection) -In the setting of dehydration.  Cultures grew pansensitive E. coli.  Completed 5 days of Rocephin   Macrocytic Anemia Thrombocytopenia -Hemoglobin around baseline of 10.5 and platelets around 105     Hypomagnesemia - As needed repletion   Hyperlipidemi  Continue Statin with Rosuvastatin 20 mg po daily    Speech and swallow-dysphagia 3 diet NO INDICATION FOR TELEMETRY.   DVT prophylaxis: Subcu heparin    Code Status: Limited: Do not attempt resuscitation (DNR) -DNR-LIMITED -Do Not Intubate/DNI  Family Communication: Mary Swanson at bedside Status is: Inpatient Remains inpatient appropriate because: DISCHARGE hopefully today  Subjective: Sitting on recliner no complaints Mary Swanson is at bedside  Examination:  General exam: Appears calm and comfortable  Respiratory system: Clear to auscultation. Respiratory effort normal. Cardiovascular system: S1 & S2 heard, RRR. No JVD, murmurs, rubs, gallops or clicks. No pedal edema. Gastrointestinal system: Abdomen is nondistended, soft and nontender. No organomegaly or masses felt. Normal bowel sounds heard. Central nervous system:No focal neurological deficits. Extremities: Symmetric 4 x 5 power. Skin: No rashes, lesions or ulcers Psychiatry: Judgement and insight appea poor    Discharge Diagnoses:  Principal Problem:   Acute kidney injury (HCC) Active Problems:   Hyperlipidemia   Dehydration   UTI (urinary tract infection)   Type 2 diabetes  mellitus with hyperglycemia, with long-term  current use of insulin [facility])      Discharge Exam: Vitals:   02/08/24 0638 02/08/24 0800  BP: 139/66 (!) 144/60  Pulse: 69 (!) 108  Resp: 16 18  Temp: 98.4 F (36.9 C) 98.1 F (36.7 C)  SpO2: 91% 92%   Vitals:   02/07/24 1616 02/07/24 2109 02/08/24 0638 02/08/24 0800  BP: 133/87 (!) 183/77 139/66 (!) 144/60  Pulse: 89 76 69 (!) 108  Resp: 18 19 16 18   Temp: 98.5 F (36.9 C) 98.9 F (37.2 C) 98.4 F (36.9 C) 98.1 F (36.7 C)  TempSrc: Oral Oral  Oral  SpO2: 94% (!) 86% 91% 92%  Weight:          Discharge Instructions   Allergies as of 02/08/2024       Reactions   Erythromycin Diarrhea   Latex Hives   Lisinopril Cough        Medication List     TAKE these medications    acetaminophen 325 MG tablet Commonly known as: TYLENOL Take 650 mg by mouth every 8 (eight) hours as needed for moderate pain, fever or headache.   AgaMatrix Presto Test test strip Generic drug: glucose blood Check blood glucose 3 times daily before meals and as needed   AgaMatrix Presto w/Device Kit Check blood glucose three times daily before meals and as needed   AgaMatrix Ultra-Thin Lancets Misc Check blood glucose 3 times daily before meals and as needed   amLODipine 10 MG tablet Commonly known as: NORVASC Take 1 tablet (10 mg total) by mouth daily.   eucerin lotion Apply 1 Application topically daily. To bilateral feet   feeding supplement (GLUCERNA SHAKE) Liqd Take 237 mLs by mouth as directed. Between meals   ferrous sulfate 220 (44 Fe) MG/5ML solution Take 7.5 mLs by mouth daily.   Insulin Pen Needle 32G X 4 MM Misc Commonly known as: BD Pen Needle Nano U/F 1 Stick by Does not apply route every morning.   Lantus SoloStar 100 UNIT/ML Solostar Pen Generic drug: insulin glargine Inject 18 Units into the skin daily. What changed:  how much to take when to take this additional instructions   LORazepam 0.5 MG tablet Commonly known as: ATIVAN Take 1 tablet (0.5  mg total) by mouth in the morning and at bedtime. What changed: when to take this   metFORMIN 1000 MG tablet Commonly known as: GLUCOPHAGE Take 1,000 mg by mouth 2 (two) times daily.   multivitamin with minerals Tabs tablet Take 1 tablet by mouth daily.   rosuvastatin 20 MG tablet Commonly known as: CRESTOR Take 20 mg by mouth at bedtime.        Allergies  Allergen Reactions   Erythromycin Diarrhea   Latex Hives   Lisinopril Cough    You were cared for by a hospitalist during your hospital stay. If you have any questions about your discharge medications or the care you received while you were in the hospital after you are discharged, you can call the unit and asked to speak with the hospitalist on call if the hospitalist that took care of you is not available. Once you are discharged, your primary care physician will handle any further medical issues. Please note that no refills for any discharge medications will be authorized once you are discharged, as it is imperative that you return to your primary care physician (or establish a relationship with a primary care physician if you do not have one)  for your aftercare needs Swanson that they can reassess your need for medications and monitor your lab values.  You were cared for by a hospitalist during your hospital stay. If you have any questions about your discharge medications or the care you received while you were in the hospital after you are discharged, you can call the unit and asked to speak with the hospitalist on call if the hospitalist that took care of you is not available. Once you are discharged, your primary care physician will handle any further medical issues. Please note that NO REFILLS for any discharge medications will be authorized once you are discharged, as it is imperative that you return to your primary care physician (or establish a relationship with a primary care physician if you do not have one) for your aftercare  needs Swanson that they can reassess your need for medications and monitor your lab values.  Please request your Prim.MD to go over all Hospital Tests and Procedure/Radiological results at the follow up, please get all Hospital records sent to your Prim MD by signing hospital release before you go home.  Get CBC, CMP, 2 view Chest X ray checked  by Primary MD during your next visit or SNF MD in 5-7 days ( we routinely change or add medications that can affect your baseline labs and fluid status, therefore we recommend that you get the mentioned basic workup next visit with your PCP, your PCP may decide not to get them or add new tests based on their clinical decision)  On your next visit with your primary care physician please Get Medicines reviewed and adjusted.  If you experience worsening of your admission symptoms, develop shortness of breath, life threatening emergency, suicidal or homicidal thoughts you must seek medical attention immediately by calling 911 or calling your MD immediately  if symptoms less severe.  You Must read complete instructions/literature along with all the possible adverse reactions/side effects for all the Medicines you take and that have been prescribed to you. Take any new Medicines after you have completely understood and accpet all the possible adverse reactions/side effects.   Do not drive, operate heavy machinery, perform activities at heights, swimming or participation in water activities or provide baby sitting services if your were admitted for syncope or siezures until you have seen by Primary MD or a Neurologist and advised to do Swanson again.  Do not drive when taking Pain medications.   Procedures/Studies: CT Head Wo Contrast Result Date: 01/31/2024 CLINICAL DATA:  Altered mental status. EXAM: CT HEAD WITHOUT CONTRAST TECHNIQUE: Contiguous axial images were obtained from the base of the skull through the vertex without intravenous contrast. RADIATION DOSE  REDUCTION: This exam was performed according to the departmental dose-optimization program which includes automated exposure control, adjustment of the mA and/or kV according to patient size and/or use of iterative reconstruction technique. COMPARISON:  December 22, 2023 FINDINGS: Brain: There is generalized cerebral atrophy with widening of the extra-axial spaces and stable ventricular dilatation. There are areas of decreased attenuation within the white matter tracts of the supratentorial brain, consistent with microvascular disease changes. Vascular: No hyperdense vessel or unexpected calcification. Skull: Normal. Negative for fracture or focal lesion. Sinuses/Orbits: No acute finding. Other: None. IMPRESSION: 1. Generalized cerebral atrophy and microvascular disease changes of the supratentorial brain. 2. No acute intracranial abnormality. Electronically Signed   By: Aram Candela M.D.   On: 01/31/2024 19:48   DG Chest Port 1 View Result Date: 01/31/2024 CLINICAL DATA:  Altered  mental status and failure to thrive. EXAM: PORTABLE CHEST 1 VIEW COMPARISON:  December 11, 2023 FINDINGS: The heart size and mediastinal contours are within normal limits. Both lungs are clear. Calcified bilateral breast implants are seen. The visualized skeletal structures are unremarkable. IMPRESSION: No active cardiopulmonary disease. Electronically Signed   By: Aram Candela M.D.   On: 01/31/2024 19:45     The results of significant diagnostics from this hospitalization (including imaging, microbiology, ancillary and laboratory) are listed below for reference.     Microbiology: Recent Results (from the past 240 hours)  Urine Culture     Status: Abnormal   Collection Time: 01/31/24  5:42 PM   Specimen: Urine, Random  Result Value Ref Range Status   Specimen Description URINE, RANDOM  Final   Special Requests   Final    NONE Reflexed from [id number] Performed at [facility], [address] [place],  [place] [address]    Culture >=100,000 COLONIES/mL ESCHERICHIA COLI (A)  Final   Report Status 02/04/2024 FINAL  Final   Organism ID, Bacteria ESCHERICHIA COLI (A)  Final      Susceptibility   Escherichia coli - MIC*    AMPICILLIN 8 SENSITIVE Sensitive     CEFAZOLIN <=4 SENSITIVE Sensitive     CEFEPIME <=0.12 SENSITIVE Sensitive     CEFTRIAXONE <=0.25 SENSITIVE Sensitive     CIPROFLOXACIN <=0.25 SENSITIVE Sensitive     GENTAMICIN <=1 SENSITIVE Sensitive     IMIPENEM <=0.25 SENSITIVE Sensitive     NITROFURANTOIN <=16 SENSITIVE Sensitive     TRIMETH/SULFA <=20 SENSITIVE Sensitive     AMPICILLIN/SULBACTAM <=2 SENSITIVE Sensitive     PIP/TAZO <=4 SENSITIVE Sensitive ug/mL    * >=100,000 COLONIES/mL ESCHERICHIA COLI  Resp panel by RT-PCR (RSV, Flu A&B, Covid) Anterior Nasal Swab     Status: None   Collection Time: 01/31/24  7:47 PM   Specimen: Anterior Nasal Swab  Result Value Ref Range Status   SARS Coronavirus 2 by RT PCR NEGATIVE NEGATIVE Final   Influenza A by PCR NEGATIVE NEGATIVE Final   Influenza B by PCR NEGATIVE NEGATIVE Final    Comment: (NOTE) The Xpert Xpress SARS-CoV-2/FLU/RSV plus assay is intended as an aid in the diagnosis of influenza from Nasopharyngeal swab specimens and should not be used as a sole basis for treatment. Nasal washings and aspirates are unacceptable for Xpert Xpress SARS-CoV-2/FLU/RSV testing.  Fact Sheet for Patients: BloggerCourse.com  Fact Sheet for Healthcare Providers: SeriousBroker.it  This test is not yet approved or cleared by the [place] FDA and has been authorized for detection and/or diagnosis of SARS-CoV-2 by FDA under an Emergency Use Authorization (EUA). This EUA will remain in effect (meaning this test can be used) for the duration of the COVID-19 declaration under Section 564(b)(1) of the Act, 21 U.S.C. section 360bbb-3(b)(1), unless the authorization is terminated or revoked.      Resp Syncytial Virus by PCR NEGATIVE NEGATIVE Final    Comment: (NOTE) Fact Sheet for Patients: BloggerCourse.com  Fact Sheet for Healthcare Providers: SeriousBroker.it  This test is not yet approved or cleared by the [place] FDA and has been authorized for detection and/or diagnosis of SARS-CoV-2 by FDA under an Emergency Use Authorization (EUA). This EUA will remain in effect (meaning this test can be used) for the duration of the COVID-19 declaration under Section 564(b)(1) of the Act, 21 U.S.C. section 360bbb-3(b)(1), unless the authorization is terminated or revoked.  Performed at [facility], [address] [place], [place] [address]      Labs: BNP (last 3 results) No results for input(s): "BNP" in the last 8760 hours. Basic Metabolic Panel: Recent Labs  Lab 02/02/24 0420 02/02/24 1331 02/03/24 0757 02/03/24 1259 02/04/24 0736 02/04/24 0923 02/05/24 0627 02/06/24 1001 02/06/24 2104 02/07/24 0757 02/07/24 0908 02/08/24 0802  NA 161*   < > 158*   < > 155*   < > 153* 150* 148*  --  147* 143  K 3.6   < > 3.5   < > 3.6   < > 3.5 3.9 3.4*  --  3.9 3.7  CL 128*   < > 126*   < > 124*   < > 123* 116* 113*  --  114* 110  CO2 22   < > 24   < > 25   < > 23 23 25   --  21* 24  GLUCOSE 193*   < > 139*   < > 179*   < > 115* 287* 199*  --  215* 246*  BUN 73*   < > 44*   < > 36*   < > 25* 17 14  --  12 9  CREATININE 3.43*   < > 2.39*   < > 2.30*   < > 1.90* 1.49* 1.55*  --  1.18* 1.27*  CALCIUM 8.7*   < > 9.0   < > 8.9   < > 9.1 9.1 9.1  --  9.1 8.9  MG 1.6*  --  2.1  --  1.9  --  2.0  --   --  1.8  --  1.7  PHOS 3.3  --  3.5  --  3.1  --  2.9  --   --  3.2  --   --    < > = values in this interval not displayed.   Liver Function Tests: Recent Labs  Lab 02/02/24 0420 02/03/24 0757 02/04/24 0736 02/05/24 0627  AST 18 21 38 30  ALT 15 19 37 35  ALKPHOS 49 51 58 58  BILITOT 0.7 0.8 0.7 0.7  PROT 6.2* 5.8*  6.2* 6.2*  ALBUMIN 2.9* 3.0* 2.8* 2.8*   No results for input(s): "LIPASE", "AMYLASE" in the last 168 hours. No results for input(s): "AMMONIA" in the last 168 hours. CBC: Recent Labs  Lab 02/02/24 0154 02/03/24 0757 02/04/24 0736 02/05/24 0627 02/07/24 0757 02/08/24 0802  WBC 9.0 7.9 6.8 6.0 5.0 5.8  NEUTROABS 5.3 5.1 4.1 3.2  --   --   HGB 11.0* 10.9* 10.6* 10.6* 10.5* 10.5*  HCT 37.1 34.7* 33.5* 33.6* 32.6* 32.6*  MCV 105.1* 98.3 98.2 97.4 95.9 96.2  PLT 132* 136* 117* 110* 125* PLATELET CLUMPS NOTED ON SMEAR, UNABLE TO ESTIMATE   Cardiac Enzymes: No results for input(s): "CKTOTAL", "CKMB", "CKMBINDEX", "TROPONINI" in the last 168 hours. BNP: Invalid input(s): "POCBNP" CBG: Recent Labs  Lab 02/07/24 0808 02/07/24 1238 02/07/24 1621 02/07/24 2112 02/08/24 0832  GLUCAP 195* 321* 277* 153* 234*   D-Dimer No results for input(s): "DDIMER" in the last 72 hours. Hgb A1c No results for input(s): "HGBA1C" in the last 72 hours. Lipid Profile No results for input(s): "CHOL", "HDL", "LDLCALC", "TRIG", "CHOLHDL", "LDLDIRECT" in the last 72 hours. Thyroid function studies No results for input(s): "TSH", "T4TOTAL", "T3FREE", "THYROIDAB" in the last 72 hours.  Invalid input(s): "FREET3" Anemia work up No results for input(s): "VITAMINB12", "FOLATE", "FERRITIN", "TIBC", "IRON", "RETICCTPCT" in the last 72 hours. Urinalysis  Component Value Date/Time   COLORURINE YELLOW 01/31/2024 1742   APPEARANCEUR HAZY (A) 01/31/2024 1742   LABSPEC 1.016 01/31/2024 1742   PHURINE 5.0 01/31/2024 1742   GLUCOSEU NEGATIVE 01/31/2024 1742   HGBUR MODERATE (A) 01/31/2024 1742   BILIRUBINUR NEGATIVE 01/31/2024 1742   KETONESUR NEGATIVE 01/31/2024 1742   PROTEINUR 30 (A) 01/31/2024 1742   NITRITE POSITIVE (A) 01/31/2024 1742   LEUKOCYTESUR MODERATE (A) 01/31/2024 1742   Sepsis Labs Recent Labs  Lab 02/04/24 0736 02/05/24 0627 02/07/24 0757 02/08/24 0802  WBC 6.8 6.0 5.0 5.8    Microbiology Recent Results (from the past 240 hours)  Urine Culture     Status: Abnormal   Collection Time: 01/31/24  5:42 PM   Specimen: Urine, Random  Result Value Ref Range Status   Specimen Description URINE, RANDOM  Final   Special Requests   Final    NONE Reflexed from [id number] Performed at [facility], [address], [place], [place] [address]    Culture >=100,000 COLONIES/mL ESCHERICHIA COLI (A)  Final   Report Status 02/04/2024 FINAL  Final   Organism ID, Bacteria ESCHERICHIA COLI (A)  Final      Susceptibility   Escherichia coli - MIC*    AMPICILLIN 8 SENSITIVE Sensitive     CEFAZOLIN <=4 SENSITIVE Sensitive     CEFEPIME <=0.12 SENSITIVE Sensitive     CEFTRIAXONE <=0.25 SENSITIVE Sensitive     CIPROFLOXACIN <=0.25 SENSITIVE Sensitive     GENTAMICIN <=1 SENSITIVE Sensitive     IMIPENEM <=0.25 SENSITIVE Sensitive     NITROFURANTOIN <=16 SENSITIVE Sensitive     TRIMETH/SULFA <=20 SENSITIVE Sensitive     AMPICILLIN/SULBACTAM <=2 SENSITIVE Sensitive     PIP/TAZO <=4 SENSITIVE Sensitive ug/mL    * >=100,000 COLONIES/mL ESCHERICHIA COLI  Resp panel by RT-PCR (RSV, Flu A&B, Covid) Anterior Nasal Swab     Status: None   Collection Time: 01/31/24  7:47 PM   Specimen: Anterior Nasal Swab  Result Value Ref Range Status   SARS Coronavirus 2 by RT PCR NEGATIVE NEGATIVE Final   Influenza A by PCR NEGATIVE NEGATIVE Final   Influenza B by PCR NEGATIVE NEGATIVE Final    Comment: (NOTE) The Xpert Xpress SARS-CoV-2/FLU/RSV plus assay is intended as an aid in the diagnosis of influenza from Nasopharyngeal swab specimens and should not be used as a sole basis for treatment. Nasal washings and aspirates are unacceptable for Xpert Xpress SARS-CoV-2/FLU/RSV testing.  Fact Sheet for Patients: BloggerCourse.com  Fact Sheet for Healthcare Providers: SeriousBroker.it  This test is not yet approved or cleared by the [place]  FDA and has been authorized for detection and/or diagnosis of SARS-CoV-2 by FDA under an Emergency Use Authorization (EUA). This EUA will remain in effect (meaning this test can be used) for the duration of the COVID-19 declaration under Section 564(b)(1) of the Act, 21 U.S.C. section 360bbb-3(b)(1), unless the authorization is terminated or revoked.     Resp Syncytial Virus by PCR NEGATIVE NEGATIVE Final    Comment: (NOTE) Fact Sheet for Patients: BloggerCourse.com  Fact Sheet for Healthcare Providers: SeriousBroker.it  This test is not yet approved or cleared by the [place] FDA and has been authorized for detection and/or diagnosis of SARS-CoV-2 by FDA under an Emergency Use Authorization (EUA). This EUA will remain in effect (meaning this test can be used) for the duration of the COVID-19 declaration under Section 564(b)(1) of the Act, 21 U.S.C. section 360bbb-3(b)(1), unless the authorization is terminated or revoked.  Performed at [facility], [address] [place], [place] [address]      Time coordinating discharge:  I have spent 35 minutes face to face with the patient and on the ward discussing the patients care, assessment, plan and disposition with other care givers. >50% of the time was devoted counseling the patient about the risks and benefits of treatment/Discharge disposition and coordinating care.   SIGNED:   Miguel Rota, MD  [facility] 02/08/2024, 11:44 AM   If 7PM-7AM, please contact night-coverage

## 2024-02-08 NOTE — Plan of Care (Signed)
  Problem: Metabolic: Goal: Ability to maintain appropriate glucose levels will improve Outcome: Progressing   Problem: Safety: Goal: Ability to remain free from injury will improve Outcome: Progressing   Problem: Skin Integrity: Goal: Risk for impaired skin integrity will decrease Outcome: Progressing

## 2024-04-29 ENCOUNTER — Emergency Department (HOSPITAL_COMMUNITY)
Admission: EM | Admit: 2024-04-29 | Discharge: 2024-04-30 | Disposition: A | Discharge status: Home / Self Care | Attending: Emergency Medicine | Admitting: Emergency Medicine

## 2024-04-29 ENCOUNTER — Other Ambulatory Visit: Payer: Self-pay

## 2024-04-29 DIAGNOSIS — Z91040 Latex allergy status: Secondary | ICD-10-CM | POA: Insufficient documentation

## 2024-04-29 DIAGNOSIS — Z794 Long term (current) use of insulin: Secondary | ICD-10-CM | POA: Insufficient documentation

## 2024-04-29 DIAGNOSIS — Z7982 Long term (current) use of aspirin: Secondary | ICD-10-CM | POA: Insufficient documentation

## 2024-04-29 DIAGNOSIS — Z79899 Other long term (current) drug therapy: Secondary | ICD-10-CM | POA: Insufficient documentation

## 2024-04-29 DIAGNOSIS — R220 Localized swelling, mass and lump, head: Secondary | ICD-10-CM | POA: Diagnosis present

## 2024-04-29 DIAGNOSIS — K029 Dental caries, unspecified: Secondary | ICD-10-CM | POA: Insufficient documentation

## 2024-04-29 DIAGNOSIS — Z7984 Long term (current) use of oral hypoglycemic drugs: Secondary | ICD-10-CM | POA: Diagnosis not present

## 2024-04-29 MED ORDER — PENICILLIN V POTASSIUM 500 MG PO TABS
500.0000 mg | ORAL_TABLET | Freq: Four times a day (QID) | ORAL | 0 refills | Status: DC
Start: 2024-04-29 — End: 2024-04-29

## 2024-04-29 MED ORDER — PENICILLIN V POTASSIUM 500 MG PO TABS: 500.0000 mg | ORAL_TABLET | Freq: Four times a day (QID) | ORAL | 0 refills | Status: AC

## 2024-04-29 NOTE — ED Notes (Addendum)
 [facility] called

## 2024-04-29 NOTE — ED Notes (Signed)
 Patient wandering in the hallway and unable to follow commands.

## 2024-04-29 NOTE — ED Triage Notes (Signed)
 Patient BIB EMS from [facility] c/o oral swelling. Per report care giver at facility notice oral swelling after giving medication tonight. Patient a/ox1 and unable to follow commands at triage. Hx of dementia.

## 2024-04-29 NOTE — ED Triage Notes (Signed)
 RN unable to assess oral swelling. Patient unable to follow commands and refused to open her mouth.

## 2024-04-29 NOTE — ED Provider Notes (Signed)
 [place] EMERGENCY DEPARTMENT AT [facility] Provider Note   CSN: [id number] Arrival date & time: 04/29/24  2133     History  Chief Complaint  Patient presents with   Oral Swelling    Mary Swanson is a 68 y.o. female.  The history is provided by the EMS personnel. The history is limited by the condition of the patient (level 5 caveat dementia).  Dental Pain Location:  Upper Timing:  Constant Progression:  Unchanged Previous work-up:  Dental exam and filled cavity Worsened by:  Nothing Ineffective treatments:  None tried Associated symptoms: facial swelling   Associated symptoms: no congestion, no neck swelling, no oral bleeding and no trismus   Sent in for reported oral swelling     Home Medications Prior to Admission medications   Medication Sig Start Date End Date Taking? Authorizing Provider  penicillin  v potassium (VEETID) 500 MG tablet Take 1 tablet (500 mg total) by mouth 4 (four) times daily for 7 days. 04/29/24 05/06/24 Yes Telly Broberg, MD  acetaminophen  (TYLENOL ) 325 MG tablet Take 650 mg by mouth every 8 (eight) hours as needed for moderate pain, fever or headache.    [provider]  AgaMatrix Ultra-Thin Lancets MISC Check blood glucose 3 times daily before meals and as needed 06/25/19   Ronalee Cocking, MD  amLODipine  (NORVASC ) 10 MG tablet Take 1 tablet (10 mg total) by mouth daily. 01/02/20   Armenta Landau, MD  Blood Glucose Monitoring Suppl [facility] PRESTO) w/Device KIT Check blood glucose three times daily before meals and as needed 06/25/19   Ronalee Cocking, MD  Emollient (EUCERIN) lotion Apply 1 Application topically daily. To bilateral feet    [provider]  feeding supplement, GLUCERNA SHAKE, (GLUCERNA SHAKE) LIQD Take 237 mLs by mouth as directed. Between meals    [provider]  ferrous sulfate  220 (44 Fe) MG/5ML solution Take 7.5 mLs by mouth daily.    [provider]  glucose blood  (AGAMATRIX PRESTO TEST) test strip Check blood glucose 3 times daily before meals and as needed 06/25/19   Ronalee Cocking, MD  insulin  glargine (LANTUS  SOLOSTAR) 100 UNIT/ML Solostar Pen Inject 18 Units into the skin daily. Patient taking differently: Inject 5-10 Units into the skin as directed. Inject 10 units [place] daily in am, Inject 5 units [place] at HS 06/18/21   Armenta Landau, MD  Insulin  Pen Needle (BD PEN NEEDLE NANO U/F) 32G X 4 MM MISC 1 Stick by Does not apply route every morning. 06/05/17   Robert Chimes, MD  LORazepam  (ATIVAN ) 0.5 MG tablet Take 1 tablet (0.5 mg total) by mouth in the morning and at bedtime. 02/07/24   Amin, Ankit C, MD  metFORMIN  (GLUCOPHAGE ) 1000 MG tablet Take 1,000 mg by mouth 2 (two) times daily. 01/14/24   [provider]  Multiple Vitamin (MULTIVITAMIN WITH MINERALS) TABS tablet Take 1 tablet by mouth daily. Patient not taking: Reported on 01/31/2024 01/02/20   Armenta Landau, MD  rosuvastatin  (CRESTOR ) 20 MG tablet Take 20 mg by mouth at bedtime.    [provider]      Allergies    Erythromycin, Latex, and Lisinopril     Review of Systems   Review of Systems  Unable to perform ROS: Dementia  HENT:  Positive for facial swelling. Negative for congestion.     Physical Exam Updated Vital Signs BP (!) 137/94 (BP Location: Left Arm)   Pulse 86   Resp 16   LMP  (  LMP Unknown)   SpO2 100%  Physical Exam Vitals and nursing note reviewed.  Constitutional:      General: She is not in acute distress.    Appearance: She is well-developed.  HENT:     Head: Normocephalic and atraumatic.     Nose: Nose normal.     Mouth/Throat:      Comments: No facial swelling no swelling of the lips tongue or uvula Eyes:     Pupils: Pupils are equal, round, and reactive to light.  Cardiovascular:     Rate and Rhythm: Normal rate and regular rhythm.     Pulses: Normal pulses.     Heart sounds: Normal heart sounds.  Pulmonary:     Effort: Pulmonary  effort is normal. No respiratory distress.     Breath sounds: Normal breath sounds.  Abdominal:     General: Bowel sounds are normal. There is no distension.     Palpations: Abdomen is soft.     Tenderness: There is no abdominal tenderness. There is no guarding or rebound.  Musculoskeletal:        General: Normal range of motion.     Cervical back: Neck supple.  Skin:    General: Skin is dry.     Capillary Refill: Capillary refill takes less than 2 seconds.     Findings: No erythema or rash.  Neurological:     General: No focal deficit present.     Deep Tendon Reflexes: Reflexes normal.  Psychiatric:        Mood and Affect: Mood normal.     ED Results / Procedures / Treatments   Labs (all labs ordered are listed, but only abnormal results are displayed) Labs Reviewed - No data to display  EKG None  Radiology No results found.  Procedures Procedures    Medications Ordered in ED Medications - No data to display  ED Course/ Medical Decision Making/ A&P                                 Medical Decision Making Reported oral swelling   Amount and/or Complexity of Data Reviewed Independent Historian: EMS    Details: See above  External Data Reviewed: notes.    Details: Previous notes reviewed   Risk Prescription drug management. Risk Details: There is no lip mouth or tongue or uvula swelling.  Dental caries.  Will treat.  Stable for discharge.  Strict returns     Final Clinical Impression(s) / ED Diagnoses Final diagnoses:  Dental caries   No signs of systemic illness or infection. The patient is nontoxic-appearing on exam and vital signs are within normal limits.  I have reviewed the triage vital signs and the nursing notes. Pertinent labs & imaging results that were available during my care of the patient were reviewed by me and considered in my medical decision making (see chart for details). After history, exam, and medical workup I feel the patient has been  appropriately medically screened and is safe for discharge home. Pertinent diagnoses were discussed with the patient. Patient was given return precautions.      Rx / DC Orders ED Discharge Orders          Ordered    penicillin  v potassium (VEETID) 500 MG tablet  4 times daily        04/29/24 2325              Shereen Marton,  MD 04/29/24 2327

## 2024-06-19 ENCOUNTER — Emergency Department (HOSPITAL_COMMUNITY)
Admission: EM | Admit: 2024-06-19 | Discharge: 2024-06-20 | Disposition: A | Discharge status: Home / Self Care | Attending: Emergency Medicine | Admitting: Emergency Medicine

## 2024-06-19 ENCOUNTER — Encounter (HOSPITAL_COMMUNITY): Payer: Self-pay

## 2024-06-19 ENCOUNTER — Other Ambulatory Visit: Payer: Self-pay

## 2024-06-19 ENCOUNTER — Emergency Department (HOSPITAL_COMMUNITY)

## 2024-06-19 DIAGNOSIS — E119 Type 2 diabetes mellitus without complications: Secondary | ICD-10-CM | POA: Insufficient documentation

## 2024-06-19 DIAGNOSIS — R600 Localized edema: Secondary | ICD-10-CM | POA: Diagnosis not present

## 2024-06-19 DIAGNOSIS — M7989 Other specified soft tissue disorders: Secondary | ICD-10-CM | POA: Insufficient documentation

## 2024-06-19 DIAGNOSIS — I10 Essential (primary) hypertension: Secondary | ICD-10-CM | POA: Diagnosis not present

## 2024-06-19 DIAGNOSIS — F0390 Unspecified dementia without behavioral disturbance: Secondary | ICD-10-CM | POA: Diagnosis not present

## 2024-06-19 LAB — COMPREHENSIVE METABOLIC PANEL WITH GFR
ALT: 19 U/L (ref 0–44)
AST: 20 U/L (ref 15–41)
Albumin: 3.9 g/dL (ref 3.5–5.0)
Alkaline Phosphatase: 59 U/L (ref 38–126)
Anion gap: 10 (ref 5–15)
BUN: 27 mg/dL — ABNORMAL HIGH (ref 8–23)
CO2: 26 mmol/L (ref 22–32)
Calcium: 10.2 mg/dL (ref 8.9–10.3)
Chloride: 107 mmol/L (ref 98–111)
Creatinine, Ser: 0.95 mg/dL (ref 0.44–1.00)
GFR, Estimated: >60 mL/min (ref >60)
Glucose, Bld: 187 mg/dL — ABNORMAL HIGH (ref 70–99)
Potassium: 3.9 mmol/L (ref 3.5–5.1)
Sodium: 143 mmol/L (ref 135–145)
Total Bilirubin: 0.8 mg/dL (ref 0.0–1.2)
Total Protein: 8.2 g/dL — ABNORMAL HIGH (ref 6.5–8.1)

## 2024-06-19 LAB — CBC WITH DIFFERENTIAL/PLATELET
Abs Immature Granulocytes: 0.03 K/uL (ref 0.00–0.07)
Basophils Absolute: 0 K/uL (ref 0.0–0.1)
Basophils Relative: 0 %
Eosinophils Absolute: 0.1 K/uL (ref 0.0–0.5)
Eosinophils Relative: 1 %
HCT: 41.3 % (ref 36.0–46.0)
Hemoglobin: 12.9 g/dL (ref 12.0–15.0)
Immature Granulocytes: 0 %
Lymphocytes Relative: 24 %
Lymphs Abs: 1.9 K/uL (ref 0.7–4.0)
MCH: 30.9 pg (ref 26.0–34.0)
MCHC: 31.2 g/dL (ref 30.0–36.0)
MCV: 99 fL (ref 80.0–100.0)
Monocytes Absolute: 0.4 K/uL (ref 0.1–1.0)
Monocytes Relative: 5 %
Neutro Abs: 5.6 K/uL (ref 1.7–7.7)
Neutrophils Relative %: 70 %
Platelets: 198 K/uL (ref 150–400)
RBC: 4.17 MIL/uL (ref 3.87–5.11)
RDW: 13.2 % (ref 11.5–15.5)
WBC: 8.2 K/uL (ref 4.0–10.5)
nRBC: 0 % (ref 0.0–0.2)

## 2024-06-19 LAB — BRAIN NATRIURETIC PEPTIDE: B Natriuretic Peptide: 44.6 pg/mL (ref 0.0–100.0)

## 2024-06-19 NOTE — ED Notes (Signed)
 Phlebotomy to collect blood work from patient.

## 2024-06-19 NOTE — Discharge Instructions (Signed)
 Your lab work and x-ray are reassuring.  Please keep both legs elevated when at rest.  You may also use compression stockings to help reduce swelling.  If you notice swelling of one leg versus the other, chest pain, trouble breathing please return to the emergency department for reevaluation.  Otherwise, please follow closely with your primary care doctor in the coming week.

## 2024-06-19 NOTE — ED Notes (Signed)
 Full linen change and paper scrubs provided with [place], RN.

## 2024-06-19 NOTE — ED Notes (Signed)
 Called [facility]

## 2024-06-19 NOTE — ED Provider Notes (Signed)
 Emergency Department Provider Note   I have reviewed the triage vital signs and the nursing notes.   HISTORY  Chief Complaint Joint Swelling   HPI Mary Swanson is a 68 y.o. female with past history of diabetes, hypertension, dementia presents from her nursing facility with ankle swelling.  Unknown duration of symptoms.  Patient is unsure why she is here.  Denies any pain. Level 5 caveat: Dementia.    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 Well appearing and in no acute distress. Eyes: Conjunctivae are normal.  Head: Atraumatic. Nose: No congestion/rhinnorhea. Mouth/Throat: Mucous membranes are moist.  Neck: No stridor.   Cardiovascular: Normal rate, regular rhythm. Good peripheral circulation. Grossly normal heart sounds.   Respiratory: Normal respiratory effort.  No retractions. Lungs CTAB. Gastrointestinal: No distention.  Musculoskeletal: No lower extremity tenderness. Minimal swelling to the bilateral ankle swelling. No gross deformities of extremities. Neurologic:  Normal speech and language. No gross focal neurologic  deficits are appreciated.  Skin:  Skin is warm, dry and intact. No rash noted.   ____________________________________________   LABS (all labs ordered are listed, but only abnormal results are displayed)  Labs Reviewed  COMPREHENSIVE METABOLIC PANEL WITH GFR - Abnormal; Notable for the following components:      Result Value   Glucose, Bld 187 (*)    BUN 27 (*)    Total Protein 8.2 (*)    All other components within normal limits  CBC WITH DIFFERENTIAL/PLATELET  BRAIN NATRIURETIC PEPTIDE   ____________________________________________  RADIOLOGY  DG Chest 2 View Result Date: 06/19/2024 CLINICAL DATA:  Lower extremity edema. EXAM: CHEST - 2 VIEW COMPARISON:  January 31, 2024. FINDINGS: The heart size and mediastinal contours are within normal limits. Both lungs are clear. The visualized skeletal structures are unremarkable. IMPRESSION: No active cardiopulmonary disease. Electronically Signed   By: Lynwood Landy Raddle M.D.   On: 06/19/2024 15:10    ____________________________________________   PROCEDURES  Procedure(s) performed:   Procedures  None  ____________________________________________   INITIAL IMPRESSION / ASSESSMENT AND PLAN / ED COURSE  Pertinent labs & imaging results that were available during my care of the patient were reviewed by me and considered in my medical decision making (see chart for details).   This patient is Presenting for Evaluation of ankle swelling, which does require a range of treatment options, and is a complaint that involves a low risk of morbidity and mortality.  The Differential Diagnoses include leg edema, arthritis, fracture, etc.   Clinical Laboratory Tests Ordered, included CBC without leukocytosis  or anemia.  BNP normal.  No acute kidney injury.  Radiologic Tests Ordered, included CXR. I independently interpreted the images and agree with radiology interpretation.   Medical Decision Making: Summary:  Patient presents emergency  department with leg swelling. Minimal swelling on exam. No erythema. No falls. Normal ROM. Plan for labs and CXR.  Swelling is bilateral and fairly minimal.  Exam does not seem consistent with DVT although this was considered.  Reevaluation with update and discussion with patient.  Looks well on reassessment.  Will give written instructions for nursing facility to elevate the legs when at rest and use compression stockings as needed with plan for close PCP follow-up in the coming week.  Patient's presentation is most consistent with acute, uncomplicated illness.   Disposition: discharge  ____________________________________________  FINAL CLINICAL IMPRESSION(S) / ED DIAGNOSES  Final diagnoses:  Leg swelling    Note:  This document was prepared using Dragon voice recognition software and may include unintentional dictation errors.  Fonda Law, MD, [facility] Emergency Medicine    Estiben Mizuno, Fonda MATSU, MD 06/19/24 [id number]

## 2024-06-19 NOTE — ED Triage Notes (Signed)
 Pt from memory care unit. C/O bilateral ankle swelling. Pt ambulatory on scene. Hx of dementia. VSS.

## 2024-08-06 ENCOUNTER — Encounter (HOSPITAL_COMMUNITY): Payer: Self-pay

## 2024-08-06 ENCOUNTER — Inpatient Hospital Stay (HOSPITAL_COMMUNITY)
Admission: EM | Admit: 2024-08-06 | Discharge: 2024-08-13 | DRG: 637 | Disposition: A | Discharge status: Skilled Nursing Facility | Source: Skilled Nursing Facility | Attending: Family Medicine | Admitting: Family Medicine

## 2024-08-06 ENCOUNTER — Emergency Department (EMERGENCY_DEPARTMENT_HOSPITAL)

## 2024-08-06 ENCOUNTER — Observation Stay (HOSPITAL_COMMUNITY)

## 2024-08-06 ENCOUNTER — Other Ambulatory Visit: Payer: Self-pay

## 2024-08-06 DIAGNOSIS — T148XXA Other injury of unspecified body region, initial encounter: Secondary | ICD-10-CM

## 2024-08-06 DIAGNOSIS — G309 Alzheimer's disease, unspecified: Secondary | ICD-10-CM | POA: Diagnosis present

## 2024-08-06 DIAGNOSIS — E538 Deficiency of other specified B group vitamins: Secondary | ICD-10-CM | POA: Diagnosis present

## 2024-08-06 DIAGNOSIS — T361X5A Adverse effect of cephalosporins and other beta-lactam antibiotics, initial encounter: Secondary | ICD-10-CM | POA: Diagnosis present

## 2024-08-06 DIAGNOSIS — E119 Type 2 diabetes mellitus without complications: Secondary | ICD-10-CM

## 2024-08-06 DIAGNOSIS — L0390 Cellulitis, unspecified: Secondary | ICD-10-CM | POA: Diagnosis present

## 2024-08-06 DIAGNOSIS — Z79899 Other long term (current) drug therapy: Secondary | ICD-10-CM

## 2024-08-06 DIAGNOSIS — E11628 Type 2 diabetes mellitus with other skin complications: Principal | ICD-10-CM | POA: Diagnosis present

## 2024-08-06 DIAGNOSIS — M79662 Pain in left lower leg: Secondary | ICD-10-CM | POA: Diagnosis not present

## 2024-08-06 DIAGNOSIS — F02C18 Dementia in other diseases classified elsewhere, severe, with other behavioral disturbance: Secondary | ICD-10-CM | POA: Diagnosis present

## 2024-08-06 DIAGNOSIS — I10 Essential (primary) hypertension: Secondary | ICD-10-CM | POA: Diagnosis present

## 2024-08-06 DIAGNOSIS — D649 Anemia, unspecified: Secondary | ICD-10-CM | POA: Diagnosis present

## 2024-08-06 DIAGNOSIS — Z66 Do not resuscitate: Secondary | ICD-10-CM | POA: Diagnosis present

## 2024-08-06 DIAGNOSIS — I5032 Chronic diastolic (congestive) heart failure: Secondary | ICD-10-CM | POA: Diagnosis present

## 2024-08-06 DIAGNOSIS — G928 Other toxic encephalopathy: Secondary | ICD-10-CM | POA: Diagnosis present

## 2024-08-06 DIAGNOSIS — L03116 Cellulitis of left lower limb: Secondary | ICD-10-CM | POA: Diagnosis present

## 2024-08-06 DIAGNOSIS — G253 Myoclonus: Secondary | ICD-10-CM | POA: Diagnosis present

## 2024-08-06 DIAGNOSIS — D509 Iron deficiency anemia, unspecified: Secondary | ICD-10-CM

## 2024-08-06 DIAGNOSIS — I110 Hypertensive heart disease with heart failure: Secondary | ICD-10-CM | POA: Diagnosis present

## 2024-08-06 DIAGNOSIS — E11649 Type 2 diabetes mellitus with hypoglycemia without coma: Secondary | ICD-10-CM | POA: Diagnosis present

## 2024-08-06 DIAGNOSIS — E1165 Type 2 diabetes mellitus with hyperglycemia: Secondary | ICD-10-CM | POA: Diagnosis present

## 2024-08-06 DIAGNOSIS — Z23 Encounter for immunization: Secondary | ICD-10-CM

## 2024-08-06 DIAGNOSIS — F02818 Dementia in other diseases classified elsewhere, unspecified severity, with other behavioral disturbance: Secondary | ICD-10-CM | POA: Diagnosis present

## 2024-08-06 DIAGNOSIS — M7989 Other specified soft tissue disorders: Principal | ICD-10-CM

## 2024-08-06 DIAGNOSIS — X58XXXA Exposure to other specified factors, initial encounter: Secondary | ICD-10-CM | POA: Diagnosis present

## 2024-08-06 DIAGNOSIS — Z881 Allergy status to other antibiotic agents status: Secondary | ICD-10-CM

## 2024-08-06 DIAGNOSIS — Z888 Allergy status to other drugs, medicaments and biological substances status: Secondary | ICD-10-CM

## 2024-08-06 DIAGNOSIS — Z794 Long term (current) use of insulin: Secondary | ICD-10-CM

## 2024-08-06 DIAGNOSIS — S90522A Blister (nonthermal), left ankle, initial encounter: Secondary | ICD-10-CM | POA: Diagnosis present

## 2024-08-06 DIAGNOSIS — R001 Bradycardia, unspecified: Secondary | ICD-10-CM | POA: Diagnosis present

## 2024-08-06 DIAGNOSIS — Z7984 Long term (current) use of oral hypoglycemic drugs: Secondary | ICD-10-CM

## 2024-08-06 DIAGNOSIS — Z91040 Latex allergy status: Secondary | ICD-10-CM

## 2024-08-06 DIAGNOSIS — E785 Hyperlipidemia, unspecified: Secondary | ICD-10-CM | POA: Diagnosis present

## 2024-08-06 DIAGNOSIS — Z7401 Bed confinement status: Secondary | ICD-10-CM

## 2024-08-06 LAB — CBC WITH DIFFERENTIAL/PLATELET
Abs Immature Granulocytes: 0.03 K/uL (ref 0.00–0.07)
Basophils Absolute: 0 K/uL (ref 0.0–0.1)
Basophils Relative: 0 %
Eosinophils Absolute: 0.1 K/uL (ref 0.0–0.5)
Eosinophils Relative: 1 %
HCT: 37 % (ref 36.0–46.0)
Hemoglobin: 11.6 g/dL — ABNORMAL LOW (ref 12.0–15.0)
Immature Granulocytes: 1 %
Lymphocytes Relative: 26 %
Lymphs Abs: 1.6 K/uL (ref 0.7–4.0)
MCH: 31 pg (ref 26.0–34.0)
MCHC: 31.4 g/dL (ref 30.0–36.0)
MCV: 98.9 fL (ref 80.0–100.0)
Monocytes Absolute: 0.3 K/uL (ref 0.1–1.0)
Monocytes Relative: 5 %
Neutro Abs: 3.9 K/uL (ref 1.7–7.7)
Neutrophils Relative %: 67 %
Platelets: 346 K/uL (ref 150–400)
RBC: 3.74 MIL/uL — ABNORMAL LOW (ref 3.87–5.11)
RDW: 12.3 % (ref 11.5–15.5)
WBC: 5.9 K/uL (ref 4.0–10.5)
nRBC: 0 % (ref 0.0–0.2)

## 2024-08-06 LAB — COMPREHENSIVE METABOLIC PANEL WITH GFR
ALT: 21 U/L (ref 0–44)
AST: 24 U/L (ref 15–41)
Albumin: 3 g/dL — ABNORMAL LOW (ref 3.5–5.0)
Alkaline Phosphatase: 58 U/L (ref 38–126)
Anion gap: 11 (ref 5–15)
BUN: 15 mg/dL (ref 8–23)
CO2: 26 mmol/L (ref 22–32)
Calcium: 9.8 mg/dL (ref 8.9–10.3)
Chloride: 106 mmol/L (ref 98–111)
Creatinine, Ser: 0.92 mg/dL (ref 0.44–1.00)
GFR, Estimated: >60 mL/min (ref >60)
Glucose, Bld: 117 mg/dL — ABNORMAL HIGH (ref 70–99)
Potassium: 4.2 mmol/L (ref 3.5–5.1)
Sodium: 143 mmol/L (ref 135–145)
Total Bilirubin: 0.9 mg/dL (ref 0.0–1.2)
Total Protein: 7 g/dL (ref 6.5–8.1)

## 2024-08-06 LAB — FERRITIN: Ferritin: 70 ng/mL (ref 11–307)

## 2024-08-06 LAB — CK: Total CK: 91 U/L (ref 38–234)

## 2024-08-06 LAB — IRON AND TIBC
Iron: 44 ug/dL (ref 28–170)
Saturation Ratios: 14 % (ref 10.4–31.8)
TIBC: 314 ug/dL (ref 250–450)
UIBC: 270 ug/dL

## 2024-08-06 LAB — MAGNESIUM: Magnesium: 1.7 mg/dL (ref 1.7–2.4)

## 2024-08-06 LAB — RETICULOCYTES
Immature Retic Fract: 22 % — ABNORMAL HIGH (ref 2.3–15.9)
RBC.: 3.75 MIL/uL — ABNORMAL LOW (ref 3.87–5.11)
Retic Count, Absolute: 100.1 K/uL (ref 19.0–186.0)
Retic Ct Pct: 2.7 % (ref 0.4–3.1)

## 2024-08-06 LAB — HEMOGLOBIN A1C
Hgb A1c MFr Bld: 5.7 % — ABNORMAL HIGH (ref 4.8–5.6)
Mean Plasma Glucose: 116.89 mg/dL

## 2024-08-06 LAB — PHOSPHORUS: Phosphorus: 3.1 mg/dL (ref 2.5–4.6)

## 2024-08-06 LAB — CBG MONITORING, ED: Glucose-Capillary: 73 mg/dL (ref 70–99)

## 2024-08-06 MED ORDER — LORAZEPAM 0.5 MG PO TABS
0.5000 mg | ORAL_TABLET | Freq: Two times a day (BID) | ORAL | Status: DC
Start: 2024-08-06 — End: 2024-08-09
  Administered 2024-08-07 – 2024-08-09 (×6): 0.5 mg via ORAL
  Filled 2024-08-06 (×6): qty 1

## 2024-08-06 MED ORDER — ACETAMINOPHEN 650 MG RE SUPP: 650.0000 mg | Freq: Four times a day (QID) | RECTAL | Status: DC | PRN

## 2024-08-06 MED ORDER — SODIUM CHLORIDE 0.9 % IV SOLN: 250.0000 mL | INTRAVENOUS | Status: AC | PRN

## 2024-08-06 MED ORDER — ENOXAPARIN SODIUM 40 MG/0.4ML IJ SOSY
40.0000 mg | PREFILLED_SYRINGE | INTRAMUSCULAR | Status: DC
  Administered 2024-08-07 – 2024-08-13 (×7): 40 mg via SUBCUTANEOUS
  Filled 2024-08-06 (×7): qty 0.4

## 2024-08-06 MED ORDER — INSULIN ASPART 100 UNIT/ML IJ SOLN: 0.0000 [IU] | INTRAMUSCULAR | Status: DC

## 2024-08-06 MED ORDER — HYDROCODONE-ACETAMINOPHEN 5-325 MG PO TABS
1.0000 | ORAL_TABLET | ORAL | Status: DC | PRN
  Administered 2024-08-11 (×2): 1 via ORAL
  Filled 2024-08-06 (×2): qty 1

## 2024-08-06 MED ORDER — ROSUVASTATIN CALCIUM 20 MG PO TABS
20.0000 mg | ORAL_TABLET | Freq: Every day | ORAL | Status: DC
  Administered 2024-08-07 – 2024-08-12 (×7): 20 mg via ORAL
  Filled 2024-08-06: qty 1
  Filled 2024-08-06: qty 4
  Filled 2024-08-06 (×5): qty 1

## 2024-08-06 MED ORDER — INSULIN GLARGINE 100 UNIT/ML ~~LOC~~ SOLN: 10.0000 [IU] | Freq: Every day | SUBCUTANEOUS | Status: DC

## 2024-08-06 MED ORDER — SODIUM CHLORIDE 0.9 % IV SOLN
2.0000 g | Freq: Two times a day (BID) | INTRAVENOUS | Status: DC
  Administered 2024-08-07: 2 g via INTRAVENOUS
  Filled 2024-08-06: qty 12.5

## 2024-08-06 MED ORDER — SODIUM ZIRCONIUM CYCLOSILICATE 5 G PO PACK
5.0000 g | PACK | Freq: Every day | ORAL | Status: DC
  Filled 2024-08-06: qty 1

## 2024-08-06 MED ORDER — AMLODIPINE BESYLATE 10 MG PO TABS
10.0000 mg | ORAL_TABLET | Freq: Every day | ORAL | Status: DC
Start: 2024-08-07 — End: 2024-08-13
  Administered 2024-08-07 – 2024-08-13 (×7): 10 mg via ORAL
  Filled 2024-08-06 (×7): qty 1

## 2024-08-06 MED ORDER — ONDANSETRON HCL 4 MG/2ML IJ SOLN: 4.0000 mg | Freq: Four times a day (QID) | INTRAMUSCULAR | Status: DC | PRN

## 2024-08-06 MED ORDER — ONDANSETRON HCL 4 MG PO TABS: 4.0000 mg | ORAL_TABLET | Freq: Four times a day (QID) | ORAL | Status: DC | PRN

## 2024-08-06 MED ORDER — ACETAMINOPHEN 325 MG PO TABS
650.0000 mg | ORAL_TABLET | Freq: Four times a day (QID) | ORAL | Status: DC | PRN
  Administered 2024-08-07: 650 mg via ORAL
  Filled 2024-08-06: qty 2

## 2024-08-06 MED ORDER — SODIUM CHLORIDE 0.9% FLUSH
3.0000 mL | Freq: Two times a day (BID) | INTRAVENOUS | Status: DC
  Administered 2024-08-06 – 2024-08-13 (×14): 3 mL via INTRAVENOUS

## 2024-08-06 MED ORDER — SODIUM CHLORIDE 0.9% FLUSH: 3.0000 mL | INTRAVENOUS | Status: DC | PRN

## 2024-08-06 MED ORDER — VANCOMYCIN HCL 1250 MG/250ML IV SOLN
1250.0000 mg | Freq: Once | INTRAVENOUS | Status: AC
  Administered 2024-08-06: 1250 mg via INTRAVENOUS
  Filled 2024-08-06: qty 250

## 2024-08-06 MED ORDER — FUROSEMIDE 20 MG PO TABS
20.0000 mg | ORAL_TABLET | Freq: Every day | ORAL | Status: DC
Start: 2024-08-07 — End: 2024-08-09
  Administered 2024-08-07 – 2024-08-09 (×3): 20 mg via ORAL
  Filled 2024-08-06 (×3): qty 1

## 2024-08-06 MED ORDER — VANCOMYCIN HCL IN DEXTROSE 1-5 GM/200ML-% IV SOLN: 1000.0000 mg | INTRAVENOUS | Status: DC

## 2024-08-06 MED ORDER — MAGNESIUM SULFATE IN D5W 1-5 GM/100ML-% IV SOLN
1.0000 g | Freq: Once | INTRAVENOUS | Status: AC
  Administered 2024-08-07: 1 g via INTRAVENOUS
  Filled 2024-08-06: qty 100

## 2024-08-06 NOTE — ED Triage Notes (Signed)
 PT BIB [facility] from [facility] after family noted swelling to L leg lasting 2 weeks. CC being blisters around L ankle and inner R elbow. PT aox0 at baseline, Hx of dementia and non verbal. EMS state recently had run of antibiotics in past week. Family en route.,  EMS VS: BP 168/64, P 75, 95% Spo2 on RA, RR 16, CBG 159

## 2024-08-06 NOTE — Progress Notes (Signed)
 ED Pharmacy Antibiotic Sign Off An antibiotic consult was received from an ED provider for vancomycin  per pharmacy dosing for cellulitis. A chart review was completed to assess appropriateness.   The following one time order(s) were placed:  Vancomycin  1250 mg Iv x 1  Further antibiotic and/or antibiotic pharmacy consults should be ordered by the admitting provider if indicated.   Thank you for allowing pharmacy to be a part of this patient's care.   Dorn Poot, [facility]  Clinical Pharmacist 08/06/24 5:55 PM

## 2024-08-06 NOTE — ED Notes (Signed)
 CCMD called to place pt on monitor

## 2024-08-06 NOTE — Assessment & Plan Note (Signed)
 Continue insulin  but decrease the dose down to 10 units nightly given patient is admitted to the hospital decreased p.o. intake

## 2024-08-06 NOTE — Assessment & Plan Note (Signed)
 Currently appears to be euvolemic avoid overaggressive fluid resuscitation

## 2024-08-06 NOTE — Progress Notes (Signed)
 LLE venous duplex has been completed.  Preliminary results given to Sarah Smoot, PA-C.   Results can be found under chart review under CV PROC. 08/06/2024 2:21 PM Sheron Tallman RVT, RDMS

## 2024-08-06 NOTE — Assessment & Plan Note (Signed)
 Continue Crestor 20 mg a day

## 2024-08-06 NOTE — ED Provider Notes (Signed)
 [place] EMERGENCY DEPARTMENT AT [facility] Provider Note   CSN: [id number] Arrival date & time: 08/06/24  1155     Patient presents with: Blister   Mary Swanson is a 68 y.o. female.  {Add pertinent medical, surgical, social history, OB history to YEP:67052} Patient with history of diabetes, hypertension, and dementia presents today from [facility] facility with concerns for blisters on her left foot and ankle as well as her right elbow. Patient is nonverbal and bedbound at baseline, alert and oriented x 0, no family at bedside and therefore history provided by nursing facility via phone. They state for the past 1 week her leg has been swollen and for the past 1-2 days she has had blistering on the area. She just completed a course of Keflex for this today. Staff member over the phone reports that she was wearing compression stockings, however she has not been recently, they are unsure of reasoning for discontinuing the compression stockings.  Do report that she had an x-ray a few days ago of her foot that was normal.  No documented fevers.  Staff does report that she is able to walk unassisted, she has been sitting more recently, however will still walk when prompted.  Level 5 caveat --- nonverbal dementia.   The history is provided by the patient. No language interpreter was used.       Prior to Admission medications   Medication Sig Start Date End Date Taking? Authorizing Provider  acetaminophen  (TYLENOL ) 325 MG tablet Take 650 mg by mouth every 8 (eight) hours as needed for moderate pain, fever or headache.    [provider]  AgaMatrix Ultra-Thin Lancets MISC Check blood glucose 3 times daily before meals and as needed 06/25/19   Adella Norris, MD  amLODipine  (NORVASC ) 10 MG tablet Take 1 tablet (10 mg total) by mouth daily. 01/02/20   Sebastian Toribio GAILS, MD  Blood Glucose Monitoring Suppl [facility] PRESTO) w/Device KIT Check blood glucose three  times daily before meals and as needed 06/25/19   Adella Norris, MD  Emollient (EUCERIN) lotion Apply 1 Application topically daily. To bilateral feet    [provider]  feeding supplement, GLUCERNA SHAKE, (GLUCERNA SHAKE) LIQD Take 237 mLs by mouth as directed. Between meals    [provider]  ferrous sulfate  220 (44 Fe) MG/5ML solution Take 7.5 mLs by mouth daily.    [provider]  glucose blood (AGAMATRIX PRESTO TEST) test strip Check blood glucose 3 times daily before meals and as needed 06/25/19   Adella Norris, MD  insulin  glargine (LANTUS  SOLOSTAR) 100 UNIT/ML Solostar Pen Inject 18 Units into the skin daily. Patient taking differently: Inject 5-10 Units into the skin as directed. Inject 10 units [place] daily in am, Inject 5 units [place] at HS 06/18/21   Sebastian Toribio GAILS, MD  Insulin  Pen Needle (BD PEN NEEDLE NANO U/F) 32G X 4 MM MISC 1 Stick by Does not apply route every morning. 06/05/17   Rennie Toribio CROME, MD  LORazepam  (ATIVAN ) 0.5 MG tablet Take 1 tablet (0.5 mg total) by mouth in the morning and at bedtime. 02/07/24   Amin, Ankit C, MD  metFORMIN  (GLUCOPHAGE ) 1000 MG tablet Take 1,000 mg by mouth 2 (two) times daily. 01/14/24   [provider]  Multiple Vitamin (MULTIVITAMIN WITH MINERALS) TABS tablet Take 1 tablet by mouth daily. Patient not taking: Reported on 01/31/2024 01/02/20   Sebastian Toribio GAILS, MD  rosuvastatin  (CRESTOR ) 20 MG tablet Take  20 mg by mouth at bedtime.    [provider]    Allergies: Erythromycin, Latex, and Lisinopril     Review of Systems  Unable to perform ROS: Dementia  Cardiovascular:  Positive for leg swelling.  Skin:  Positive for wound.  All other systems reviewed and are negative.   Updated Vital Signs BP (!) 164/73 (BP Location: Left Arm)   Pulse 70   Temp (!) 97.4 F (36.3 C) (Oral)   Resp 20   Ht 5' 6 (1.676 m)   Wt 61.2 kg   LMP  (LMP Unknown)   SpO2 100%   BMI 21.79 kg/m   Physical  Exam Vitals and nursing note reviewed.  Constitutional:      General: She is not in acute distress.    Appearance: Normal appearance. She is normal weight. She is not ill-appearing, toxic-appearing or diaphoretic.  HENT:     Head: Normocephalic and atraumatic.  Cardiovascular:     Rate and Rhythm: Normal rate.  Pulmonary:     Effort: Pulmonary effort is normal. No respiratory distress.  Musculoskeletal:        General: Normal range of motion.     Cervical back: Normal range of motion.     Comments: 1 cm x 1 cm healing wound noted to the right elbow area.  No edema, erythema, warmth, fluctuance, or induration.  Good passive ROM, radial pulse intact and 2+.  1+ pitting edema present to LLE, none to RLE.  No erythema or warmth.  DP and PT pulses intact and 2+.  Blistering present to the medial malleolus and over the Achilles containing serous fluid, no purulence.  Patient unable to follow commands and therefore active ROM is limited, however passive ROM intact.  Achilles is patent.  See images below for further  Skin:    General: Skin is warm and dry.  Neurological:     General: No focal deficit present.     Mental Status: She is alert. Mental status is at baseline.     Comments: Nonverbal, alert and oriented x 0 per baseline.  Psychiatric:        Mood and Affect: Mood normal.        Behavior: Behavior normal.        (all labs ordered are listed, but only abnormal results are displayed) Labs Reviewed  COMPREHENSIVE METABOLIC PANEL WITH GFR - Abnormal; Notable for the following components:      Result Value   Glucose, Bld 117 (*)    Albumin 3.0 (*)    All other components within normal limits  CBC WITH DIFFERENTIAL/PLATELET  CBC WITH DIFFERENTIAL/PLATELET    EKG: None  Radiology: No results found.  {Document cardiac monitor, telemetry assessment procedure when appropriate:32947} Procedures   Medications Ordered in the ED - No data to display    {Click here for  ABCD2, HEART and other calculators REFRESH Note before signing:1}                              Medical Decision Making Amount and/or Complexity of Data Reviewed Labs: ordered.   This patient is a 68 y.o. female who presents to the ED for concern of LLE swelling, blisters, this involves an extensive number of treatment options, and is a complaint that carries with it a high risk of complications and morbidity. The emergent differential diagnosis prior to evaluation includes, but is not limited to,  cellulitis, sepsis, venous stasis  changes, pressure ulcer. This is not an exhaustive differential.   Past Medical History / Co-morbidities / Social History:  has a past medical history of ACE-inhibitor cough (03/14/2019), Acute lower UTI (06/16/2021), Acute metabolic encephalopathy (12/28/2019), AKI (acute kidney injury) (HCC) (12/27/2019), Altered mental status (06/15/2021), Dehydration (12/27/2019), Diabetes mellitus (HCC) (12/28/2019), Diabetes mellitus without complication (HCC), Diabetic foot infection (HCC) (01/06/2022), Hypernatremia (06/16/2021), Hypertension, Memory loss of unknown cause (12/03/2019), Sepsis due to Streptococcus pyogenes due to right foot cellulitis (01/06/2022), and Transaminitis (01/06/2022).  Patient with advanced dementia, alert and oriented x 0 at baseline, nonverbal.  Additional history: Chart reviewed. Pertinent results include: seen for leg swelling on 06/19/24, had benign evaluation, was told to wear compression stockings  Physical Exam: Physical exam performed. The pertinent findings include:   1 cm x 1 cm healing wound noted to the right elbow area.  No edema, erythema, warmth, fluctuance, or induration.  Good passive ROM, radial pulse intact and 2+.  1+ pitting edema present to LLE, none to RLE.  No erythema or warmth.  DP and PT pulses intact and 2+.  Blistering present to the medial malleolus and over the Achilles containing serous fluid, no purulence.  Patient unable to  follow commands and therefore active ROM is limited, however passive ROM intact.  Achilles is patent.  See images below for further  Lab Tests: I ordered, and personally interpreted labs.  The pertinent results include:  CMP unremarkable, CBC pending   Imaging Studies: I ordered imaging studies including DVT US . I independently visualized and interpreted imaging which showed some enlarged lymph nodes in the groin, no DVT. I agree with the radiologist interpretation.  However, ultrasound tech did inform me that she was unable to get the patient's pants off and therefore the left femoral vein was not visualized.  Patient's pants were removed, I did inform the ultrasound tech of this and request that she return to complete the study. This is pending at shift change   Disposition:  Patients completed DVT US  with left femoral vein is pending at shift change as well as her CBC. If both are negative, suspect she can be discharged home with recommendations for wound care and RICE through her facility. Her wounds do not appear infected at this time, therefore no indication for antibiotics.   Care handoff to [facility], PA-C at shift change. Please see their note for continued evaluation and dispo.  I discussed this case with my attending physician Dr. Simon who cosigned this note including patient's presenting symptoms, physical exam, and planned diagnostics and interventions. Attending physician stated agreement with plan or made changes to plan which were implemented.    Final diagnoses:  Left leg swelling  Blister    ED Discharge Orders     None

## 2024-08-06 NOTE — ED Notes (Signed)
 Charge RN called 5N with courtesy call that pt is coming to unit

## 2024-08-06 NOTE — Progress Notes (Signed)
 Pharmacy Antibiotic Note  Mary Swanson is a 68 y.o. female admitted on 08/06/2024 with left leg pain/swelling.  Pharmacy has been consulted for cefepime /vancomycin  dosing for cellulitis. Previously treated with keflex with no improvement.  -WBC 5.9, afebrile, sCr 0.92  -Vanc 1250mg  IV x1 -MRSA PCR ordered  Plan: -Cefepime  2g IV every 12 hours -Vancomycin  1250mg  IV x1 -Vancomycin  1g IV every 24 hours (AUC 460, Vd 0.72, IBW, sCr 0.92) -Monitor renal function -Follow up signs of clinical improvement, LOT, de-escalation of antibiotics   Height: 5' 6 (167.6 cm) Weight: 61.2 kg (135 lb) IBW/kg (Calculated) : 59.3  Temp (24hrs), Avg:97.6 F (36.4 C), Min:97.4 F (36.3 C), Max:97.8 F (36.6 C)  Recent Labs  Lab 08/06/24 1254 08/06/24 1350  WBC  --  5.9  CREATININE 0.92  --     Estimated Creatinine Clearance: 54 mL/min (by C-G formula based on SCr of 0.92 mg/dL).    Allergies  Allergen Reactions   Erythromycin Diarrhea   Latex Hives   Lisinopril  Cough    Antimicrobials this admission: Cefepime  9/11 >>  Vancomycin  9/10 >>   Thank you for allowing pharmacy to be a part of this patient's care.  Lynwood Poplar, PharmD, BCPS Clinical Pharmacist 08/06/2024 10:30 PM

## 2024-08-06 NOTE — Discharge Instructions (Signed)
 Your work-up was reassuring today. I recommend you elevate your leg and wear compression stockings to help with swelling. Your facility should be able to help with wound care for your blisters. They do not appear infected and therefore no antibiotics are indicated at this time.   Return if development of any new or worsening symptoms

## 2024-08-06 NOTE — ED Provider Notes (Signed)
   Accepted handoff at shift change from Smoot PA-C. Please see prior provider note for more detail.   Briefly: Patient is 68 y.o. ' presents today from [facility] with concerns for blisters on her left foot and ankle as well as her right elbow. Patient is nonverbal and bedbound at baseline, alert and oriented x 0, no family at bedside and therefore history provided by nursing facility via phone. They state for the past 1 week her leg has been swollen and for the past 1-2 days she has had blistering on the area. She just completed a course of Keflex for this today. Staff member over the phone reports that she was wearing compression stockings, however she has not been recently, they are unsure of reasoning for discontinuing the compression stockings.  Do report that she had an x-ray a few days ago of her foot that was normal.  No documented fevers.  Staff does report that she is able to walk unassisted, she has been sitting more recently, however will still walk when prompted.   Level 5 caveat --- nonverbal dementia.'  DDX: concern for cellulitis, sepsis, venous stasis changes, pressure ulcer. This is not an exhaustive differential.   Plan:  - assess patient after US  DVT study - US  without concern for DVT. Reassessed patient. LLE with erythematous rash that is warm to touch. There are also what appears to be fluid filled blisters with negative nikolsky sign. I talked with Mr. Cobb who is patient's legal guardian. Mr. Malachy stating that patient's leg continues to get worse despite being on ABX recently. Patient meeting criteria for admission for cellulitis failing outpatient therapy. Patient is otherwise stable. CBC without leukocytosis. There is mild anemia.  CMP only with mild hyperglycemia at 117.  - Consulted with hospitalist on-call Dr. Dominique who agrees to admit patient.      .Critical Care  Performed by: Hoy Nidia FALCON, PA-C Authorized by: Hoy Nidia FALCON,  PA-C   Critical care provider statement:    Critical care time (minutes):  30   Critical care was necessary to treat or prevent imminent or life-threatening deterioration of the following conditions: cellulitis failing outpatient management.   Critical care was time spent personally by me on the following activities:  Development of treatment plan with patient or surrogate, discussions with consultants, evaluation of patient's response to treatment, examination of patient, ordering and review of laboratory studies, ordering and review of radiographic studies, ordering and performing treatments and interventions, pulse oximetry, re-evaluation of patient's condition and review of old charts   Care discussed with: admitting provider       Hoy Nidia FALCON, PA-C 08/06/24 1916    Gennaro Duwaine CROME, DO 08/07/24 1534

## 2024-08-06 NOTE — H&P (Signed)
 Mary Swanson FMW:[id number] DOB: 1956/03/15 DOA: 08/06/2024    Patient arrived to ER on 08/06/24 at 1155 Referred by Attending Silvester Ales, MD   Patient coming from:    From facility   SNF,     Chief Complaint:   Chief Complaint  Patient presents with   Blister    HPI: Mary Swanson is a 68 y.o. female with medical history significant of  Dementia, DM2, HTN HLD    Presented with   left leg pain swellng  Patient coming in from [facility]  with left lower extremity cellulitis. Was treated outpatient with Keflex for the past 2 weeks has been having blisters around left ankle At baseline has severe dementia and nonverbal Left leg has been more swollen And blisters occurred over the past 2 days Keflex was finished yesterday but still seems to be not improving She has been using compression stockings did not seem to be helping recently been stopped Recent x-ray of the foot was unremarkable no fevers at facility she is able to walk unassisted but has been more sedentary lately Patient appears to have diabetes at baseline  Doppler done in ER showed no DVT  Pt was started on vanc        Regarding pertinent Chronic problems:    Hyperlipidemia -  on statins crestor  Lipid Panel     Component Value Date/Time   CHOL 160 10/01/2019 1124   TRIG 130 10/01/2019 1124   HDL 57 10/01/2019 1124   CHOLHDL 2.8 10/01/2019 1124   LDLCALC 80 10/01/2019 1124   LABVLDL 23 10/01/2019 1124     HTN on lasix    chronic CHF diastolic - last echo  Recent Results (from the past 56199 hours)  ECHOCARDIOGRAM COMPLETE   Collection Time: 01/08/22  3:15 PM  Result Value   Weight 2,804.25   Height 66   BP 157/63   Single Plane A2C EF 63.4   Single Plane A4C EF 64.0   Calc EF 61.9   S' Lateral 2.50   Area-P 1/2 4.15   Narrative      ECHOCARDIOGRAM REPORT     IMPRESSIONS    1. Left ventricular ejection fraction, by estimation, is 60 to 65%. The left ventricle has normal  function. The left ventricle has no regional wall motion abnormalities. Left ventricular diastolic parameters are consistent with Grade I diastolic  dysfunction (impaired relaxation).  2. Right ventricular systolic function is normal. The right ventricular size is normal. There is normal pulmonary artery systolic pressure. The estimated right ventricular systolic pressure is 34.1 mmHg.  3. The mitral valve is normal in structure. Mild mitral valve regurgitation. No evidence of mitral stenosis.  4. The aortic valve is normal in structure. Aortic valve regurgitation is not visualized. No aortic stenosis is present.  5. The inferior vena cava is normal in size with greater than 50% respiratory variability, suggesting right atrial pressure of 3 mmHg.             DM 2 -  Lab Results  Component Value Date   HGBA1C 8.1 (H) 01/31/2024    on insulin , PO meds   dementia While in ER:    Doppler neg for DVT     Lab Orders         Comprehensive metabolic panel         CBC with Differential         CBC with Differential/Platelet         Hemoglobin A1c  Following Medications were ordered in ER: Medications  vancomycin  (VANCOREADY) IVPB 1250 mg/250 mL (1,250 mg Intravenous New Bag/Given 08/06/24 1822)  insulin  aspart (novoLOG ) injection 0-9 Units (has no administration in time range)        ED Triage Vitals [08/06/24 1206]  Encounter Vitals Group     BP (!) 164/73     Girls Systolic BP Percentile      Girls Diastolic BP Percentile      Boys Systolic BP Percentile      Boys Diastolic BP Percentile      Pulse Rate 70     Resp 20     Temp (!) 97.4 F (36.3 C)     Temp Source Oral     SpO2 100 %     Weight 135 lb (61.2 kg)     Height 5' 6 (1.676 m)     Head Circumference      Peak Flow      Pain Score      Pain Loc      Pain Education      Exclude from Growth Chart   UFJK(75)@     _________________________________________ Significant initial  Findings: Abnormal Labs  Reviewed  COMPREHENSIVE METABOLIC PANEL WITH GFR - Abnormal; Notable for the following components:      Result Value   Glucose, Bld 117 (*)    Albumin 3.0 (*)    All other components within normal limits  CBC WITH DIFFERENTIAL/PLATELET - Abnormal; Notable for the following components:   RBC 3.74 (*)    Hemoglobin 11.6 (*)    All other components within normal limits     ECG: Ordered    The recent clinical data is shown below. Vitals:   08/06/24 1619 08/06/24 1719 08/06/24 1830 08/06/24 1845  BP:  (!) 187/95 (!) 213/176 (!) 163/68  Pulse:  80 69 69  Resp:  16 14 16   Temp: 97.8 F (36.6 C)     TempSrc: Axillary     SpO2:  97% 100% 100%  Weight:      Height:        WBC     Component Value Date/Time   WBC 5.9 08/06/2024 1350   LYMPHSABS 1.6 08/06/2024 1350   MONOABS 0.3 08/06/2024 1350   EOSABS 0.1 08/06/2024 1350   BASOSABS 0.0 08/06/2024 1350         ABX started Antibiotics Given (last 72 hours)     Date/Time Action Medication Dose Rate   08/06/24 1822 New Bag/Given   vancomycin  (VANCOREADY) IVPB 1250 mg/250 mL 1,250 mg 166.7 mL/hr        No results found for the last 90 days.     __________________________________________________________ Recent Labs  Lab 08/06/24 1254  NA 143  K 4.2  CO2 26  GLUCOSE 117*  BUN 15  CREATININE 0.92  CALCIUM  9.8    Cr   stable,    Lab Results  Component Value Date   CREATININE 0.92 08/06/2024   CREATININE 0.95 06/19/2024   CREATININE 1.27 (H) 02/08/2024    Recent Labs  Lab 08/06/24 1254  AST 24  ALT 21  ALKPHOS 58  BILITOT 0.9  PROT 7.0  ALBUMIN 3.0*   Lab Results  Component Value Date   CALCIUM  9.8 08/06/2024   PHOS 3.2 02/07/2024    Plt: Lab Results  Component Value Date   PLT 346 08/06/2024    Recent Labs  Lab 08/06/24 1350  WBC 5.9  NEUTROABS 3.9  HGB 11.6*  HCT 37.0  MCV 98.9  PLT 346    HG/HCT  stable,      Component Value Date/Time   HGB 11.6 (L) 08/06/2024 1350   HGB 14.8  05/11/2017 1639   HCT 37.0 08/06/2024 1350   HCT 44.2 05/11/2017 1639   MCV 98.9 08/06/2024 1350   MCV 95 05/11/2017 1639   _______________________________ Hospitalist was called for admission for   Cellulitis of left lower extremity   The following Work up has been ordered so far:  Orders Placed This Encounter  Procedures   Critical Care   Comprehensive metabolic panel   CBC with Differential   CBC with Differential/Platelet   Hemoglobin A1c   Cardiac Monitoring Continuous x 12 hours Indications for use: Other; other indications for use: severe hypertention   Apply Diabetes Mellitus Care Plan   STAT CBG when hypoglycemia is suspected. If treated, recheck every 15 minutes after each treatment until CBG >/= 70 mg/dl   Refer to Hypoglycemia Protocol Sidebar Report for treatment of CBG < 70 mg/dl   Consult for [facility] Admission  Cellulitis failing outpatient ABX   Place in observation (patient's expected length of stay will be less than 2 midnights)   VAS US  LOWER EXTREMITY VENOUS (DVT) (7a-7p)     OTHER Significant initial  Findings:  labs showing:     DM  labs:  HbA1C: Recent Labs    01/31/24 2349  HGBA1C 8.1*       CBG (last 3)  No results for input(s): GLUCAP in the last 72 hours.        Cultures:    Component Value Date/Time   SDES URINE, RANDOM 01/31/2024 1742   SPECREQUEST  01/31/2024 1742    NONE Reflexed from [id number] Performed at [facility], [address], [place], [place] [address]    CULT >=100,000 COLONIES/mL ESCHERICHIA COLI (A) 01/31/2024 1742   REPTSTATUS 02/04/2024 FINAL 01/31/2024 1742     Radiological Exams on Admission: VAS US  LOWER EXTREMITY VENOUS (DVT) (7a-7p) Result Date: 08/06/2024  Lower Venous DVT Study Patient Name:  SHARINE CADLE  Date of Exam:   08/06/2024 Medical Rec #: [id number]     Accession #:    [id number] Date of Birth: Feb 16, 1956    Patient Gender: F Patient Age:   58 years Exam Location:  [facility] Procedure:      VAS US  LOWER EXTREMITY VENOUS (DVT) Referring Phys: [facility] SMOOT --------------------------------------------------------------------------------  Indications: Swelling.  Limitations: Patient unable to cooperate (dementia), unable to get pants off. Comparison Study: No previous exams Performing Technologist: Jody Hill RVT, RDMS  Examination Guidelines: A complete evaluation includes B-mode imaging, spectral Doppler, color Doppler, and power Doppler as needed of all accessible portions of each vessel. Bilateral testing is considered an integral part of a complete examination. Limited examinations for reoccurring indications may be performed as noted. The reflux portion of the exam is performed with the patient in reverse Trendelenburg.  +-----+---------------+---------+-----------+----------+--------------+ RIGHTCompressibilityPhasicitySpontaneityPropertiesThrombus Aging +-----+---------------+---------+-----------+----------+--------------+ CFV  Full           Yes      Yes                                 +-----+---------------+---------+-----------+----------+--------------+   +---------+---------------+---------+-----------+----------+------------------+ LEFT     CompressibilityPhasicitySpontaneityPropertiesThrombus Aging     +---------+---------------+---------+-----------+----------+------------------+ CFV      Full           Yes  Yes                                     +---------+---------------+---------+-----------+----------+------------------+ SFJ      Full                                                            +---------+---------------+---------+-----------+----------+------------------+ FV Prox  Full           Yes      Yes                                     +---------+---------------+---------+-----------+----------+------------------+ FV Mid                                                unable to                                                                 visualized         +---------+---------------+---------+-----------+----------+------------------+ FV DistalFull           Yes      Yes                                     +---------+---------------+---------+-----------+----------+------------------+ PFV      Full           Yes      Yes                                     +---------+---------------+---------+-----------+----------+------------------+ POP      Full           Yes      Yes                                     +---------+---------------+---------+-----------+----------+------------------+ PTV                                                   Not well                                                                 visualized         +---------+---------------+---------+-----------+----------+------------------+ PERO     Full                                                            +---------+---------------+---------+-----------+----------+------------------+  Left Technical Findings: Not visualized segments include FV (mid) due to inability to take off pants.   Summary: RIGHT: - No evidence of common femoral vein obstruction.   LEFT: - There is no evidence of deep vein thrombosis in the lower extremity. However, portions of this examination were limited- see technologist comments above.  - No cystic structure found in the popliteal fossa. - Ultrasound characteristics of enlarged lymph nodes noted in the groin. subcutaneous edema on calf and ankle.  *See table(s) above for measurements and observations. Electronically signed by Lonni Gaskins MD on 08/06/2024 at 2:53:49 PM.    Final    _______________________________________________________________________________________________________ Latest  Blood pressure (!) 163/68, pulse 69, temperature 97.8 F (36.6 C), temperature source Axillary, resp. rate 16, height 5' 6 (1.676 m), weight 61.2 kg, SpO2 100%.    Vitals  labs and radiology finding personally reviewed  Review of Systems:    Pertinent positives include: confusion left leg pain and swelling  Constitutional:  No weight loss, night sweats, Fevers, chills, fatigue, weight loss  HEENT:  No headaches, Difficulty swallowing,Tooth/dental problems,Sore throat,  No sneezing, itching, ear ache, nasal congestion, post nasal drip,  Cardio-vascular:  No chest pain, Orthopnea, PND, anasarca, dizziness, palpitations.no Bilateral lower extremity swelling  GI:  No heartburn, indigestion, abdominal pain, nausea, vomiting, diarrhea, change in bowel habits, loss of appetite, melena, blood in stool, hematemesis Resp:  no shortness of breath at rest. No dyspnea on exertion, No excess mucus, no productive cough, No non-productive cough, No coughing up of blood.No change in color of mucus.No wheezing. Skin:  no rash or lesions. No jaundice GU:  no dysuria, change in color of urine, no urgency or frequency. No straining to urinate.  No flank pain.  Musculoskeletal:  No joint pain or no joint swelling. No decreased range of motion. No back pain.  Psych:  No change in mood or affect. No depression or anxiety. No memory loss.  Neuro: no localizing neurological complaints, no tingling, no weakness, no double vision, no gait abnormality, no slurred speech, no confusion  All systems reviewed and apart from HOPI all are negative _______________________________________________________________________________________________ Past Medical History:   Past Medical History:  Diagnosis Date   ACE-inhibitor cough 03/14/2019   Acute lower UTI 06/16/2021   Acute metabolic encephalopathy 12/28/2019   AKI (acute kidney injury) (HCC) 12/27/2019   Altered mental status 06/15/2021   Dehydration 12/27/2019   Diabetes mellitus (HCC) 12/28/2019   Diabetes mellitus without complication (HCC)    Diabetic foot infection (HCC) 01/06/2022   Hypernatremia 06/16/2021   Hypertension     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 She has never used smokeless tobacco. She reports that she does not currently use alcohol. She reports that she does not use drugs.     Family History:   Family History  Problem Relation Age of Onset   Diabetes Neg Hx    ______________________________________________________________________________________________ Allergies: Allergies  Allergen Reactions   Erythromycin Diarrhea   Latex Hives   Lisinopril  Cough           Prior to Admission medications   Medication Sig Start Date End Date Taking? Authorizing Provider  acetaminophen  (TYLENOL ) 325 MG tablet  Take 650 mg by mouth every 8 (eight) hours as needed for moderate pain, fever or headache.   Yes [provider]  cephALEXin (KEFLEX) 250 MG capsule Take 250 mg by mouth 2 (two) times daily. 07/31/24 08/07/24 Yes [provider]  feeding supplement, GLUCERNA SHAKE, (GLUCERNA SHAKE) LIQD Take 237 mLs by mouth 2 (two) times daily between meals. Between meals   Yes [provider]  ferrous sulfate  220 (44 Fe) MG/5ML solution Take 7.5 mLs by mouth daily.   Yes [provider]  furosemide  (LASIX ) 20 MG tablet Take 20 mg by mouth daily. 07/29/24  Yes [provider]  insulin  glargine (LANTUS  SOLOSTAR) 100 UNIT/ML Solostar Pen Inject 18 Units into the skin daily. 06/18/21  Yes Sebastian Toribio GAILS, MD  LOKELMA  5 g packet Take 1 packet by mouth daily. 07/30/24  Yes [provider]  LORazepam  (ATIVAN ) 0.5 MG tablet Take 1 tablet (0.5 mg total) by mouth in the morning and at bedtime. 02/07/24  Yes Amin, Ankit C, MD  metFORMIN  (GLUCOPHAGE ) 1000 MG tablet Take  1,000 mg by mouth 2 (two) times daily. 01/14/24  Yes [provider]  Multiple Vitamin (MULTIVITAMIN WITH MINERALS) TABS tablet Take 1 tablet by mouth daily. 01/02/20  Yes Sebastian Toribio GAILS, MD  rosuvastatin  (CRESTOR ) 20 MG tablet Take 20 mg by mouth at bedtime.   Yes [provider]  amLODipine  (NORVASC ) 10 MG tablet Take 1 tablet (10 mg total) by mouth daily. Patient not taking: Reported on 08/06/2024 01/02/20   Sebastian Toribio GAILS, MD    ___________________________________________________________________________________________________ Physical Exam:    08/06/2024    6:45 PM 08/06/2024    6:30 PM 08/06/2024    5:19 PM  Vitals with BMI  Systolic 163 213 812  Diastolic 68 176 95  Pulse 69 69 80     1. General:  in No  Acute distress    Chronically ill   -appearing 2. Psychological: Alert  patient is not orient to place or time, non verbal 3. Head/ENT:    Dry Mucous Membranes                          Head Non traumatic, neck supple                          Poor Dentition 4. SKIN: decreased Skin turgor,  Skin clean Dry            5. Heart: Regular rate and rhythm no  Murmur, no Rub or gallop 6. Lungs:  , no wheezes or crackles   7. Abdomen: Soft,  non-tender, Non distended bowel sounds present 8. Lower extremities: no clubbing, cyanosis,  edema Left > right 9. Neurologically Grossly intact, moving all 4 extremities equally   10. MSK: Normal range of motion    Chart has been reviewed  ______________________________________________________________________________________________  Assessment/Plan  68 y.o. female with medical history significant of  Dementia, DM2, HTN HLD Admitted for  Cellulitis of left lower extremity     Present on Admission:  Cellulitis  Alzheimer's dementia with behavioral disturbance (HCC)  Anemia  Essential hypertension  Hyperlipidemia  Chronic diastolic CHF (congestive heart failure) (HCC)     Alzheimer's dementia with  behavioral disturbance (HCC) Monitor for any sundowning  continue home dose of Ativan   Anemia Obtain anemia panel  Transfuse for Hg <7 , rapidly dropping or  if symptomatic   Cellulitis -admit per  cellulitis protocol will          continue current antibiotic choice vancomycin  and cefepime       Obtain plain films        Will obtain MRSA screening,       obtain blood cultures  if febrile or septic     further antibiotic adjustment pending above results   DM (diabetes mellitus), type 2 (HCC) Order sliding scale decrease Lantus  down to 10 units  Patient had an episode of hypoglycemia will DC sliding scale and Lantus  for now administered D25-monitor blood sugar Q2  Essential hypertension Continue Lasix  restart Norvasc  given severe hypertension  Hyperlipidemia Continue Crestor  20 mg a day  Type 2 diabetes mellitus with hyperglycemia, with long-term current use of insulin  (HCC) Continue insulin  but decrease the dose down to 10 units nightly given patient is admitted to the hospital decreased p.o. intake  Chronic diastolic CHF (congestive heart failure) (HCC) Currently appears to be euvolemic avoid overaggressive fluid resuscitation    Other plan as per orders.  DVT prophylaxis:  Lovenox     Code Status:   DNR/DNI  as per patient paperwork at bedside   Family Communication:   Family not at  Bedside    Diet diabetic   Disposition Plan:                              Back to current facility when stable                             Following barriers for discharge:                         able to transition to PO antibiotics                                                     Transition of care consulted                                     Wound care  consulted                     Consults called:    Admission status:  ED Disposition     ED Disposition  Admit   Condition  --   Comment  Hospital Area: [facility] [[id number]]  Level of Care:  Telemetry Medical [104]  Covid Evaluation: Asymptomatic - no recent exposure (last 10 days) testing not required  Diagnosis: Cellulitis [807680]  Admitting Physician: Shelia Kingsberry [[id number]]  Attending Physician: Ziya Coonrod [[id number]]  For patients discharging to extended facilities (i.e. SNF, AL, group homes or LTAC) initiate:: Discharge to SNF/Facility Placement COVID-19 Lab Testing Protocol           Obs   Need for IV antibiotics, IV fluids,, IV pain medications, IV anticoagulation,  IV rate controling medications, IV antihypertensives need for biPAP Frequent labs    Level of care     tele  For 12H      Alarik Radu 08/06/2024, 7:49 PM    [facility]     after 2 AM please page floor  coverage   If 7AM-7PM, please contact the day team taking care of the patient using [email or web address]

## 2024-08-06 NOTE — Assessment & Plan Note (Signed)
 Order sliding scale decrease Lantus  down to 10 units  Patient had an episode of hypoglycemia will DC sliding scale and Lantus  for now administered D25-monitor blood sugar Q2

## 2024-08-06 NOTE — Assessment & Plan Note (Signed)
 Monitor for any sundowning  continue home dose of Ativan 

## 2024-08-06 NOTE — ED Notes (Signed)
Labs drawn and sent, tolerated well.

## 2024-08-06 NOTE — ED Notes (Addendum)
Patient transported to Vascular 

## 2024-08-06 NOTE — ED Notes (Signed)
 Lab called to say CBC was hemolyzed and needs recollect

## 2024-08-06 NOTE — Assessment & Plan Note (Signed)
 Continue Lasix  restart Norvasc  given severe hypertension

## 2024-08-06 NOTE — Assessment & Plan Note (Signed)
 Obtain anemia panel  Transfuse for Hg <7 , rapidly dropping or  if symptomatic

## 2024-08-06 NOTE — ED Notes (Signed)
New CBC drawn and sent

## 2024-08-06 NOTE — Subjective & Objective (Signed)
 Patient coming in from [facility]  with left lower extremity cellulitis. Was treated outpatient with Keflex for the past 2 weeks has been having blisters around left ankle At baseline has severe dementia and nonverbal Left leg has been more swollen And blisters occurred over the past 2 days Keflex was finished yesterday but still seems to be not improving She has been using compression stockings did not seem to be helping recently been stopped Recent x-ray of the foot was unremarkable no fevers at facility she is able to walk unassisted but has been more sedentary lately Patient appears to have diabetes at baseline  Doppler done in ER showed no DVT  Pt was started on vanc

## 2024-08-06 NOTE — Assessment & Plan Note (Signed)
-  admit per  cellulitis protocol will          continue current antibiotic choice vancomycin  and cefepime       Obtain plain films        Will obtain MRSA screening,       obtain blood cultures  if febrile or septic     further antibiotic adjustment pending above results

## 2024-08-07 DIAGNOSIS — E11649 Type 2 diabetes mellitus with hypoglycemia without coma: Secondary | ICD-10-CM | POA: Diagnosis present

## 2024-08-07 DIAGNOSIS — E538 Deficiency of other specified B group vitamins: Secondary | ICD-10-CM | POA: Diagnosis present

## 2024-08-07 DIAGNOSIS — G928 Other toxic encephalopathy: Secondary | ICD-10-CM | POA: Diagnosis present

## 2024-08-07 DIAGNOSIS — M7989 Other specified soft tissue disorders: Secondary | ICD-10-CM | POA: Diagnosis not present

## 2024-08-07 DIAGNOSIS — Z79899 Other long term (current) drug therapy: Secondary | ICD-10-CM | POA: Diagnosis not present

## 2024-08-07 DIAGNOSIS — G253 Myoclonus: Secondary | ICD-10-CM | POA: Diagnosis present

## 2024-08-07 DIAGNOSIS — I5032 Chronic diastolic (congestive) heart failure: Secondary | ICD-10-CM | POA: Diagnosis present

## 2024-08-07 DIAGNOSIS — E11628 Type 2 diabetes mellitus with other skin complications: Secondary | ICD-10-CM | POA: Diagnosis present

## 2024-08-07 DIAGNOSIS — Z7984 Long term (current) use of oral hypoglycemic drugs: Secondary | ICD-10-CM | POA: Diagnosis not present

## 2024-08-07 DIAGNOSIS — F0280 Dementia in other diseases classified elsewhere without behavioral disturbance: Secondary | ICD-10-CM | POA: Diagnosis not present

## 2024-08-07 DIAGNOSIS — T361X4A Poisoning by cephalosporins and other beta-lactam antibiotics, undetermined, initial encounter: Secondary | ICD-10-CM | POA: Diagnosis not present

## 2024-08-07 DIAGNOSIS — T361X5A Adverse effect of cephalosporins and other beta-lactam antibiotics, initial encounter: Secondary | ICD-10-CM | POA: Diagnosis not present

## 2024-08-07 DIAGNOSIS — D649 Anemia, unspecified: Secondary | ICD-10-CM | POA: Diagnosis present

## 2024-08-07 DIAGNOSIS — E1165 Type 2 diabetes mellitus with hyperglycemia: Secondary | ICD-10-CM | POA: Diagnosis present

## 2024-08-07 DIAGNOSIS — I110 Hypertensive heart disease with heart failure: Secondary | ICD-10-CM | POA: Diagnosis present

## 2024-08-07 DIAGNOSIS — Z23 Encounter for immunization: Secondary | ICD-10-CM | POA: Diagnosis present

## 2024-08-07 DIAGNOSIS — S90522A Blister (nonthermal), left ankle, initial encounter: Secondary | ICD-10-CM | POA: Diagnosis present

## 2024-08-07 DIAGNOSIS — F02C18 Dementia in other diseases classified elsewhere, severe, with other behavioral disturbance: Secondary | ICD-10-CM | POA: Diagnosis present

## 2024-08-07 DIAGNOSIS — E785 Hyperlipidemia, unspecified: Secondary | ICD-10-CM | POA: Diagnosis present

## 2024-08-07 DIAGNOSIS — Z91040 Latex allergy status: Secondary | ICD-10-CM | POA: Diagnosis not present

## 2024-08-07 DIAGNOSIS — G9340 Encephalopathy, unspecified: Secondary | ICD-10-CM | POA: Diagnosis not present

## 2024-08-07 DIAGNOSIS — R4182 Altered mental status, unspecified: Secondary | ICD-10-CM | POA: Diagnosis not present

## 2024-08-07 DIAGNOSIS — Z794 Long term (current) use of insulin: Secondary | ICD-10-CM | POA: Diagnosis not present

## 2024-08-07 DIAGNOSIS — X58XXXA Exposure to other specified factors, initial encounter: Secondary | ICD-10-CM | POA: Diagnosis present

## 2024-08-07 DIAGNOSIS — T148XXA Other injury of unspecified body region, initial encounter: Secondary | ICD-10-CM | POA: Diagnosis not present

## 2024-08-07 DIAGNOSIS — G309 Alzheimer's disease, unspecified: Secondary | ICD-10-CM | POA: Diagnosis present

## 2024-08-07 DIAGNOSIS — L03116 Cellulitis of left lower limb: Secondary | ICD-10-CM | POA: Diagnosis present

## 2024-08-07 DIAGNOSIS — R569 Unspecified convulsions: Secondary | ICD-10-CM | POA: Diagnosis not present

## 2024-08-07 DIAGNOSIS — G9341 Metabolic encephalopathy: Secondary | ICD-10-CM | POA: Diagnosis not present

## 2024-08-07 DIAGNOSIS — R001 Bradycardia, unspecified: Secondary | ICD-10-CM | POA: Diagnosis present

## 2024-08-07 DIAGNOSIS — Z66 Do not resuscitate: Secondary | ICD-10-CM | POA: Diagnosis present

## 2024-08-07 DIAGNOSIS — E119 Type 2 diabetes mellitus without complications: Secondary | ICD-10-CM | POA: Diagnosis not present

## 2024-08-07 DIAGNOSIS — Z881 Allergy status to other antibiotic agents status: Secondary | ICD-10-CM | POA: Diagnosis not present

## 2024-08-07 DIAGNOSIS — Z888 Allergy status to other drugs, medicaments and biological substances status: Secondary | ICD-10-CM | POA: Diagnosis not present

## 2024-08-07 DIAGNOSIS — Z7401 Bed confinement status: Secondary | ICD-10-CM | POA: Diagnosis not present

## 2024-08-07 LAB — GLUCOSE, CAPILLARY
Glucose-Capillary: 118 mg/dL — ABNORMAL HIGH (ref 70–99)
Glucose-Capillary: 126 mg/dL — ABNORMAL HIGH (ref 70–99)
Glucose-Capillary: 161 mg/dL — ABNORMAL HIGH (ref 70–99)
Glucose-Capillary: 182 mg/dL — ABNORMAL HIGH (ref 70–99)
Glucose-Capillary: 240 mg/dL — ABNORMAL HIGH (ref 70–99)
Glucose-Capillary: 259 mg/dL — ABNORMAL HIGH (ref 70–99)
Glucose-Capillary: 61 mg/dL — ABNORMAL LOW (ref 70–99)
Glucose-Capillary: 98 mg/dL (ref 70–99)

## 2024-08-07 LAB — CBC
HCT: 30.9 % — ABNORMAL LOW (ref 36.0–46.0)
Hemoglobin: 9.8 g/dL — ABNORMAL LOW (ref 12.0–15.0)
MCH: 31 pg (ref 26.0–34.0)
MCHC: 31.7 g/dL (ref 30.0–36.0)
MCV: 97.8 fL (ref 80.0–100.0)
Platelets: 316 K/uL (ref 150–400)
RBC: 3.16 MIL/uL — ABNORMAL LOW (ref 3.87–5.11)
RDW: 11.9 % (ref 11.5–15.5)
WBC: 4.8 K/uL (ref 4.0–10.5)
nRBC: 0 % (ref 0.0–0.2)

## 2024-08-07 LAB — MAGNESIUM: Magnesium: 2 mg/dL (ref 1.7–2.4)

## 2024-08-07 LAB — COMPREHENSIVE METABOLIC PANEL WITH GFR
ALT: 17 U/L (ref 0–44)
AST: 14 U/L — ABNORMAL LOW (ref 15–41)
Albumin: 2.6 g/dL — ABNORMAL LOW (ref 3.5–5.0)
Alkaline Phosphatase: 46 U/L (ref 38–126)
Anion gap: 10 (ref 5–15)
BUN: 10 mg/dL (ref 8–23)
CO2: 30 mmol/L (ref 22–32)
Calcium: 9.2 mg/dL (ref 8.9–10.3)
Chloride: 104 mmol/L (ref 98–111)
Creatinine, Ser: 0.73 mg/dL (ref 0.44–1.00)
GFR, Estimated: >60 mL/min (ref >60)
Glucose, Bld: 92 mg/dL (ref 70–99)
Potassium: 3.6 mmol/L (ref 3.5–5.1)
Sodium: 144 mmol/L (ref 135–145)
Total Bilirubin: 0.6 mg/dL (ref 0.0–1.2)
Total Protein: 6.2 g/dL — ABNORMAL LOW (ref 6.5–8.1)

## 2024-08-07 LAB — FOLATE: Folate: >20 ng/mL (ref >5.9)

## 2024-08-07 LAB — VITAMIN B12: Vitamin B-12: 282 pg/mL (ref 180–914)

## 2024-08-07 LAB — PHOSPHORUS: Phosphorus: 3.4 mg/dL (ref 2.5–4.6)

## 2024-08-07 MED ORDER — DEXTROSE 50 % IV SOLN: 25.0000 mL | Freq: Once | INTRAVENOUS | Status: DC

## 2024-08-07 MED ORDER — SODIUM CHLORIDE 0.9 % IV SOLN
2.0000 g | Freq: Three times a day (TID) | INTRAVENOUS | Status: DC
  Administered 2024-08-07 – 2024-08-08 (×3): 2 g via INTRAVENOUS
  Filled 2024-08-07 (×3): qty 12.5

## 2024-08-07 MED ORDER — HYDRALAZINE HCL 25 MG PO TABS
25.0000 mg | ORAL_TABLET | Freq: Four times a day (QID) | ORAL | Status: DC | PRN
Start: 2024-08-07 — End: 2024-08-13
  Administered 2024-08-07 – 2024-08-09 (×2): 25 mg via ORAL
  Filled 2024-08-07 (×2): qty 1

## 2024-08-07 MED ORDER — VANCOMYCIN HCL 1250 MG/250ML IV SOLN
1250.0000 mg | INTRAVENOUS | Status: DC
  Administered 2024-08-07: 1250 mg via INTRAVENOUS
  Filled 2024-08-07: qty 250

## 2024-08-07 MED ORDER — INFLUENZA VAC SPLIT HIGH-DOSE 0.5 ML IM SUSY
0.5000 mL | PREFILLED_SYRINGE | INTRAMUSCULAR | Status: AC
  Administered 2024-08-07: 0.5 mL via INTRAMUSCULAR
  Filled 2024-08-07: qty 0.5

## 2024-08-07 NOTE — Consult Note (Addendum)
 WOC Nurse Consult Note: Reason for Consult: multiple wounds  Wound type: 1.  L lower leg serum filled blister  2.  R plantar foot (base of 5th digit) callus r/t neuropathy  3.  L ankle partial thickness appears to be ruptured bulla  4. L plantar foot base of 5th digit and base of Great toe heavily callused, dark tissue underneath callus of R great digit r/t neuropathy  5  R upper arm partial thickness pink  Pressure Injury POA: not pressure related  Measurement: see nursing flowsheet  Wound bed:  as above  Drainage (amount, consistency, odor) see nursing flowsheet  Periwound: edema to B lower legs  Dressing procedure/placement/frequency:  Cleanse L ankle and lower leg blisters/wounds with NS, apply Xeroform gauze Soila [phone]) to wound beds daily and secure with silicone foam or Kerlix roll gauze whichever is preferred.  Cleanse R upper arm skin tear with NS, apply Xeroform gauze Soila [phone]) to wound daily and secure with silicone foam.  Cleanse B plantar feet with soap and water, dry and paint callused areas with Betadine 2 times daily.  Cover with silicone foam after Betadine dries.   Patient would benefit from referral to podiatry for ongoing management and debridement of callused areas plantar feet.   POC discussed with bedside nurse. WOC team will not follow. Re-consult if further needs arise.   Thank you,    Powell Bar MSN, RN-BC, [employer]

## 2024-08-07 NOTE — Evaluation (Signed)
 Clinical/Bedside Swallow Evaluation Patient Details  Name: Mary Swanson MRN: [id number] Date of Birth: November 28, 1955  Today's Date: 08/07/2024 Time: SLP Start Time (ACUTE ONLY): 0847 SLP Stop Time (ACUTE ONLY): 0900 SLP Time Calculation (min) (ACUTE ONLY): 13 min  Past Medical History:  Past Medical History:  Diagnosis Date   ACE-inhibitor cough 03/14/2019   Acute lower UTI 06/16/2021   Acute metabolic encephalopathy 12/28/2019   AKI (acute kidney injury) (HCC) 12/27/2019   Altered mental status 06/15/2021   Dehydration 12/27/2019   Diabetes mellitus (HCC) 12/28/2019   Diabetes mellitus without complication (HCC)    Diabetic foot infection (HCC) 01/06/2022   Hypernatremia 06/16/2021   Hypertension    Memory loss of unknown cause 12/03/2019   Sepsis due to Streptococcus pyogenes due to right foot cellulitis 01/06/2022   Transaminitis 01/06/2022   Past Surgical History:  Past Surgical History:  Procedure Laterality Date   CATARACT EXTRACTION, BILATERAL Bilateral    HPI:  68 yo female admitted from SNF with LLE cellulitis. Most recent swallow eval available was in March 2025 with signs of cognitively-based dysphagia with recommendations for chopped foods and thin liquids.  PMH includes: dementia, DM2, HTN, HLD    Assessment / Plan / Recommendation  Clinical Impression  Pt was being fed breakfast by her visitors upon SLP arrival. Pt not able to answer questions but they report no difficulties swallowing PTA. No overt signs of aspiration were observed during breakfast meal and swallowing overall appears to be functional, without as much evidence of impact from cognition as has been previously reported. Per review of documentation, pt is typically on regular solids (finger foods)/thin liquids at her facility, which is also the current diet ordered here. Will leave diet in place. No further SLP f/u indicated acutely.   SLP Visit Diagnosis: Dysphagia, unspecified (R13.10)    Aspiration Risk        Diet Recommendation Regular;Thin liquid    Liquid Administration via: Cup;Straw Medication Administration: Crushed with puree Supervision: Full supervision/cueing for compensatory strategies;Staff to assist with self feeding Compensations: Slow rate;Small sips/bites;Minimize environmental distractions Postural Changes: Seated upright at 90 degrees    Other  Recommendations Oral Care Recommendations: Oral care BID     Assistance Recommended at Discharge    Functional Status Assessment Patient has not had a recent decline in their functional status  Frequency and Duration            Prognosis        Swallow Study   General HPI: 68 yo female admitted from SNF with LLE cellulitis. Most recent swallow eval available was in March 2025 with signs of cognitively-based dysphagia with recommendations for chopped foods and thin liquids.  PMH includes: dementia, DM2, HTN, HLD Type of Study: Bedside Swallow Evaluation Previous Swallow Assessment: see HPI Diet Prior to this Study: Regular;Thin liquids (Level 0) Temperature Spikes Noted: No Respiratory Status: Room air History of Recent Intubation: No Behavior/Cognition: Alert;Cooperative;Pleasant mood;Doesn't follow directions Oral Cavity Assessment: Other (comment) (UTA) Oral Care Completed by SLP: No Oral Cavity - Dentition: Adequate natural dentition Vision: Functional for self-feeding Self-Feeding Abilities: Total assist Patient Positioning: Partially reclined (visitors already feeding her upon SLP arrival, partially reclined) Baseline Vocal Quality: Normal Volitional Cough: Cognitively unable to elicit Volitional Swallow: Unable to elicit    Oral/Motor/Sensory Function Overall Oral Motor/Sensory Function: Other (comment) (not following commands to assess)   Ice Chips Ice chips: Not tested   Thin Liquid Thin Liquid: Within functional limits Presentation: Cup    Nectar Thick  Nectar Thick Liquid: Not tested   Honey Thick Honey  Thick Liquid: Not tested   Puree Puree: Within functional limits Presentation: Spoon   Solid     Solid: Within functional limits      Leita SAILOR., M.A. CCC-SLP Acute Rehabilitation Services Office: [phone]  Secure chat preferred  08/07/2024,9:06 AM

## 2024-08-07 NOTE — Plan of Care (Signed)

## 2024-08-07 NOTE — Progress Notes (Signed)
 Pharmacy Antibiotic Note  Mary Swanson is a 68 y.o. female admitted on 08/06/2024 with left leg pain/swelling.  Pharmacy has been consulted for cefepime /vancomycin  dosing for cellulitis. Previously treated with keflex with no improvement.  -WBC 5.9, afebrile, sCr 0.73 -Vanc 1250mg  IV x1 -MRSA PCR ordered  Plan: -adj Cefepime  2g IV every 8 hours -adj Vancomycin  1250 mg IV every 24 hours (AUC 507, Vd 0.72, IBW, sCr 0.8, Tss ~11) -Monitor renal function -Follow up signs of clinical improvement, LOT, de-escalation of antibiotics   Height: 5' 6 (167.6 cm) Weight: 61.2 kg (135 lb) IBW/kg (Calculated) : 59.3  Temp (24hrs), Avg:97.6 F (36.4 C), Min:97.4 F (36.3 C), Max:97.8 F (36.6 C)  Recent Labs  Lab 08/06/24 1254 08/06/24 1350 08/07/24 0516  WBC  --  5.9 4.8  CREATININE 0.92  --  0.73    Estimated Creatinine Clearance: 62.1 mL/min (by C-G formula based on SCr of 0.73 mg/dL).    Allergies  Allergen Reactions   Erythromycin Diarrhea   Latex Hives   Lisinopril  Cough    Antimicrobials this admission: Cefepime  9/11 >>  Vancomycin  9/10 >>   Thank you for allowing pharmacy to be a part of this patient's care.  Benedetta Heath BS, PharmD, BCPS Clinical Pharmacist 08/07/2024 8:34 AM  Contact: [phone] after 3 PM

## 2024-08-07 NOTE — Progress Notes (Signed)
 [facility]  PROGRESS NOTE  Mary Swanson FMW:[id number] DOB: 05-28-1956 DOA: 08/06/2024 PCP: Patient, No Pcp Per   Brief HPI:   68 y.o. female with medical history significant of  Dementia, DM2, HTN HLD     Presented with   left leg pain swellng  Patient coming in from [facility]  with left lower extremity cellulitis. Was treated outpatient with Keflex for the past 2 weeks has been having blisters around left ankle At baseline has severe dementia and nonverbal Left leg has been more swollen And blisters occurred over the past 2 days Keflex was finished yesterday but still seems to be not improving She has been using compression stockings did not seem to be helping recently been stopped Recent x-ray of the foot was unremarkable no fevers at facility she is able to walk unassisted but has been more sedentary lately Patient appears to have diabetes at baseline  Doppler done in ER showed no DVT  Pt was started on vanc    Assessment/Plan:   Alzheimer's dementia with behavioral disturbance (HCC) Monitor for any sundowning  continue home dose of Ativan   Anemia Obtain anemia panel  Transfuse for Hg <7 , rapidly dropping or  if symptomatic     Cellulitis -Started on empiric antibiotics vancomycin  and cefepime  -X-ray of tibia/fibula on left showed no acute fracture or dislocation, diffuse subcutaneous soft tissue edema and blister/nodule along the distal medial calf -Wound care RN consulted -Recommend outpatient podiatry referral for debridement of calluses   DM (diabetes mellitus), type 2 (HCC) Order sliding scale decrease Lantus  down to 10 units due to episode of hypoglycemia -CBG well-controlled  -Continue CBG monitoring every 4 hours   Essential hypertension Continue Lasix  restart Norvasc  given severe hypertension   Hyperlipidemia Continue Crestor  20 mg a day  LikelyChronic diastolic CHF (congestive heart failure) (HCC) Currently appears to be  euvolemic avoid overaggressive fluid resuscitation          Medications     amLODipine   10 mg Oral Daily   dextrose   25 mL Intravenous Once   enoxaparin  (LOVENOX ) injection  40 mg Subcutaneous Q24H   furosemide   20 mg Oral Daily   LORazepam   0.5 mg Oral BID   rosuvastatin   20 mg Oral QHS   sodium chloride  flush  3 mL Intravenous Q12H     Data Reviewed:   CBG:  Recent Labs  Lab 08/07/24 0021 08/07/24 0110 08/07/24 0212 08/07/24 0429 08/07/24 1129  GLUCAP 61* 118* 126* 98 240*    SpO2: 100 %    Vitals:   08/06/24 1830 08/06/24 1845 08/06/24 2015 08/06/24 2055  BP: (!) 213/176 (!) 163/68 (!) 181/66 (!) 181/91  Pulse: 69 69 67 62  Resp: 14 16 16 14   Temp:   (!) 97.4 F (36.3 C) 97.8 F (36.6 C)  TempSrc:   Axillary Axillary  SpO2: 100% 100% 100% 100%  Weight:      Height:          Data Reviewed:  Basic Metabolic Panel: Recent Labs  Lab 08/06/24 1254 08/06/24 2243 08/07/24 0516  NA 143  --  144  K 4.2  --  3.6  CL 106  --  104  CO2 26  --  30  GLUCOSE 117*  --  92  BUN 15  --  10  CREATININE 0.92  --  0.73  CALCIUM  9.8  --  9.2  MG  --  1.7 2.0  PHOS  --  3.1 3.4  CBC: Recent Labs  Lab 08/06/24 1350 08/07/24 0516  WBC 5.9 4.8  NEUTROABS 3.9  --   HGB 11.6* 9.8*  HCT 37.0 30.9*  MCV 98.9 97.8  PLT 346 316    LFT Recent Labs  Lab 08/06/24 1254 08/07/24 0516  AST 24 14*  ALT 21 17  ALKPHOS 58 46  BILITOT 0.9 0.6  PROT 7.0 6.2*  ALBUMIN 3.0* 2.6*     Antibiotics: Anti-infectives (From admission, onward)    Start     Dose/Rate Route Frequency Ordered Stop   08/07/24 1800  vancomycin  (VANCOCIN ) IVPB 1000 mg/200 mL premix  Status:  Discontinued        1,000 mg 200 mL/hr over 60 Minutes Intravenous Every 24 hours 08/06/24 2235 08/07/24 0832   08/07/24 1800  vancomycin  (VANCOREADY) IVPB 1250 mg/250 mL        1,250 mg 166.7 mL/hr over 90 Minutes Intravenous Every 24 hours 08/07/24 0832     08/07/24 0930  ceFEPIme   (MAXIPIME ) 2 g in sodium chloride  0.9 % 100 mL IVPB        2 g 200 mL/hr over 30 Minutes Intravenous Every 8 hours 08/07/24 0830     08/07/24 0000  ceFEPIme  (MAXIPIME ) 2 g in sodium chloride  0.9 % 100 mL IVPB  Status:  Discontinued        2 g 200 mL/hr over 30 Minutes Intravenous Every 12 hours 08/06/24 2235 08/07/24 0830   08/06/24 1800  vancomycin  (VANCOREADY) IVPB 1250 mg/250 mL        1,250 mg 166.7 mL/hr over 90 Minutes Intravenous  Once 08/06/24 1755 08/06/24 2003        DVT prophylaxis: Lovenox   Code Status: DNR  Family Communication: No family at bedside   CONSULTS    Subjective   No new complaints   Objective    Physical Examination:  Appears in no acute distress Abdomen is soft, nontender Neuro-confused, not following commands, at baseline Extremities-left foot is warm to touch, left ankle blister noted   Status is: Inpatient:             Mary Swanson   [facility] If 7PM-7AM, please contact night-coverage at [email or web address], Office  [phone]   08/07/2024, 4:12 PM  LOS: 0 days

## 2024-08-07 NOTE — Care Management Obs Status (Signed)
 MEDICARE OBSERVATION STATUS NOTIFICATION   Patient Details  Name: Gracious Renken MRN: [id number] Date of Birth: 03-Jan-1956   Medicare Observation Status Notification Given:  Yes    Jon Cruel 08/07/2024, 9:46 AM

## 2024-08-08 DIAGNOSIS — L03116 Cellulitis of left lower limb: Secondary | ICD-10-CM | POA: Diagnosis not present

## 2024-08-08 DIAGNOSIS — T148XXA Other injury of unspecified body region, initial encounter: Secondary | ICD-10-CM | POA: Diagnosis not present

## 2024-08-08 DIAGNOSIS — G309 Alzheimer's disease, unspecified: Secondary | ICD-10-CM | POA: Diagnosis not present

## 2024-08-08 DIAGNOSIS — E119 Type 2 diabetes mellitus without complications: Secondary | ICD-10-CM | POA: Diagnosis not present

## 2024-08-08 LAB — CBC
HCT: 37.1 % (ref 36.0–46.0)
Hemoglobin: 11.9 g/dL — ABNORMAL LOW (ref 12.0–15.0)
MCH: 31.2 pg (ref 26.0–34.0)
MCHC: 32.1 g/dL (ref 30.0–36.0)
MCV: 97.1 fL (ref 80.0–100.0)
Platelets: 236 K/uL (ref 150–400)
RBC: 3.82 MIL/uL — ABNORMAL LOW (ref 3.87–5.11)
RDW: 12 % (ref 11.5–15.5)
WBC: 5.3 K/uL (ref 4.0–10.5)
nRBC: 0 % (ref 0.0–0.2)

## 2024-08-08 LAB — GLUCOSE, CAPILLARY
Glucose-Capillary: 130 mg/dL — ABNORMAL HIGH (ref 70–99)
Glucose-Capillary: 157 mg/dL — ABNORMAL HIGH (ref 70–99)
Glucose-Capillary: 161 mg/dL — ABNORMAL HIGH (ref 70–99)
Glucose-Capillary: 164 mg/dL — ABNORMAL HIGH (ref 70–99)
Glucose-Capillary: 195 mg/dL — ABNORMAL HIGH (ref 70–99)
Glucose-Capillary: 198 mg/dL — ABNORMAL HIGH (ref 70–99)

## 2024-08-08 LAB — BASIC METABOLIC PANEL WITH GFR
Anion gap: 10 (ref 5–15)
BUN: 8 mg/dL (ref 8–23)
CO2: 26 mmol/L (ref 22–32)
Calcium: 9.1 mg/dL (ref 8.9–10.3)
Chloride: 108 mmol/L (ref 98–111)
Creatinine, Ser: 0.88 mg/dL (ref 0.44–1.00)
GFR, Estimated: >60 mL/min (ref >60)
Glucose, Bld: 142 mg/dL — ABNORMAL HIGH (ref 70–99)
Potassium: 3.8 mmol/L (ref 3.5–5.1)
Sodium: 144 mmol/L (ref 135–145)

## 2024-08-08 MED ORDER — VANCOMYCIN HCL 500 MG/100ML IV SOLN
500.0000 mg | Freq: Two times a day (BID) | INTRAVENOUS | Status: DC
  Administered 2024-08-08 – 2024-08-09 (×3): 500 mg via INTRAVENOUS
  Filled 2024-08-08 (×4): qty 100

## 2024-08-08 MED ORDER — SODIUM CHLORIDE 0.9 % IV SOLN
2.0000 g | Freq: Two times a day (BID) | INTRAVENOUS | Status: DC
  Administered 2024-08-08 – 2024-08-09 (×3): 2 g via INTRAVENOUS
  Filled 2024-08-08 (×3): qty 12.5

## 2024-08-08 MED ORDER — METOPROLOL TARTRATE 25 MG PO TABS
25.0000 mg | ORAL_TABLET | Freq: Two times a day (BID) | ORAL | Status: DC
Start: 2024-08-08 — End: 2024-08-11
  Administered 2024-08-08 – 2024-08-10 (×5): 25 mg via ORAL
  Filled 2024-08-08 (×7): qty 1

## 2024-08-08 NOTE — TOC Initial Note (Addendum)
 Transition of Care [facility]) - Initial/Assessment Note    Patient Details  Name: Mary Swanson MRN: [id number] Date of Birth: 06/22/1956  Transition of Care [facility]) CM/SW Contact:    Bridget Cordella Simmonds, LCSW Phone Number: 08/08/2024, 1:33 PM  Clinical Narrative:    Pt not oriented.  CSW attempted to reach listed guardian Phyliss Cobb, several back and forth voicemails left, CSW was able to reach [facility], who confirmed he and his sister are legal guardian. He also confirmed pt is at [facility] and they do want pt to return.                Copy of guardianship order requested.   Expected Discharge Plan: Assisted Living [facility]) Barriers to Discharge: Continued Medical Work up   Patient Goals and CMS Choice            Expected Discharge Plan and Services In-house Referral: Clinical Social Work     Living arrangements for the past 2 months: Assisted Living Facility                                      Prior Living Arrangements/Services Living arrangements for the past 2 months: Assisted Living Facility Lives with:: Facility Resident Patient language and need for interpreter reviewed:: No        Need for Family Participation in Patient Care: Yes (Comment) Care giver support system in place?: Yes (comment) Current home services: Other (comment) (na) Criminal Activity/Legal Involvement Pertinent to Current Situation/Hospitalization: No - Comment as needed  Activities of Daily Living   ADL Screening (condition at time of admission) Independently performs ADLs?: No Does the patient have a NEW difficulty with bathing/dressing/toileting/self-feeding that is expected to last >3 days?: No Does the patient have a NEW difficulty with getting in/out of bed, walking, or climbing stairs that is expected to last >3 days?: No Does the patient have a NEW difficulty with communication that is expected to last >3 days?: No Is the patient deaf or have difficulty  hearing?: No Does the patient have difficulty seeing, even when wearing glasses/contacts?: Yes Does the patient have difficulty concentrating, remembering, or making decisions?: Yes  Permission Sought/Granted                  Emotional Assessment Appearance:: Appears older than stated age Attitude/Demeanor/Rapport: Unable to Assess Affect (typically observed): Unable to Assess Orientation: :  (per epic, unable to assess)      Admission diagnosis:  Blister [T14.8XXA] Cellulitis [L03.90] Cellulitis of left lower extremity [L03.116] Left leg swelling [M79.89] Patient Active Problem List   Diagnosis Date Noted   Cellulitis 08/06/2024   Chronic diastolic CHF (congestive heart failure) (HCC) 08/06/2024   Acute kidney injury (HCC) 01/31/2024   Palliative care encounter 01/09/2023   Type 2 diabetes mellitus with hyperglycemia, with long-term current use of insulin  (HCC) 06/23/2022   Uncontrolled type 2 diabetes mellitus with hyperglycemia, with long-term current use of insulin  with hyperlipidemia 01/07/2022   UTI (urinary tract infection) 06/16/2021   Alzheimer's dementia with behavioral disturbance (HCC)    Abnormal CXR    Essential hypertension    Dehydration 12/27/2019   Hyperglycemia 12/03/2019   Heart murmur, systolic 05/29/2017   DM (diabetes mellitus), type 2 (HCC) 01/24/2007   Hyperlipidemia 01/24/2007   Obesity (BMI 30-39.9) 01/24/2007   Anemia 01/24/2007   History of depression 01/24/2007   RHINITIS, ALLERGIC 01/24/2007  PCP:  Patient, No Pcp Per Pharmacy:   [facility]  - [place], [place] - [address] [address] [place] [address] Phone: [phone] Fax: [phone]  [facility] - [place], [place] - [place] [address] [place] [address] Phone: [phone] Fax: [phone]     Social Drivers of Health (SDOH) Social History: SDOH Screenings   Food Insecurity:  Patient Declined (08/07/2024)  Housing: Unknown (08/07/2024)  Transportation Needs: Patient Unable To Answer (08/07/2024)  Utilities: Patient Unable To Answer (08/07/2024)  Depression (PHQ2-9): Low Risk  (10/01/2019)  Social Connections: Patient Unable To Answer (08/07/2024)  Tobacco Use: Low Risk  (08/06/2024)   SDOH Interventions:     Readmission Risk Interventions    01/10/2022   12:44 PM  Readmission Risk Prevention Plan  Transportation Screening Complete  PCP or Specialist Appt within 3-5 Days Complete  HRI or Home Care Consult Complete  Social Work Consult for Recovery Care Planning/Counseling Complete  Palliative Care Screening Complete  Medication Review Oceanographer) Complete

## 2024-08-08 NOTE — Progress Notes (Signed)
 [facility]  PROGRESS NOTE  Robinette Esters FMW:[id number] DOB: 12/20/1955 DOA: 08/06/2024 PCP: Patient, No Pcp Per   Brief HPI:   68 y.o. female with medical history significant of  Dementia, DM2, HTN HLD     Presented with   left leg pain swellng  Patient coming in from [facility]  with left lower extremity cellulitis. Was treated outpatient with Keflex for the past 2 weeks has been having blisters around left ankle At baseline has severe dementia and nonverbal Left leg has been more swollen And blisters occurred over the past 2 days Keflex was finished yesterday but still seems to be not improving She has been using compression stockings did not seem to be helping recently been stopped Recent x-ray of the foot was unremarkable no fevers at facility she is able to walk unassisted but has been more sedentary lately Patient appears to have diabetes at baseline  Doppler done in ER showed no DVT  Pt was started on vanc    Assessment/Plan:   Alzheimer's dementia with behavioral disturbance (HCC) Monitor for any sundowning  continue home dose of Ativan   Anemia Obtain anemia panel  Transfuse for Hg <7 , rapidly dropping or  if symptomatic     Cellulitis -Started on empiric antibiotics vancomycin  and cefepime  -X-ray of tibia/fibula on left showed no acute fracture or dislocation, diffuse subcutaneous soft tissue edema and blister/nodule along the distal medial calf -Wound care RN consulted -Recommend outpatient podiatry referral for debridement of calluses   DM (diabetes mellitus), type 2 (HCC) Order sliding scale decrease Lantus  down to 10 units due to episode of hypoglycemia -CBG well-controlled  -Continue CBG monitoring every 4 hours   Essential hypertension Continue Lasix ; restart Norvasc  given severe hypertension -Added hydralazine  as needed Start metoprolol  25 mg p.o. twice daily   Hyperlipidemia Continue Crestor  20 mg a day  LikelyChronic  diastolic CHF (congestive heart failure) (HCC) Currently appears to be euvolemic avoid overaggressive fluid resuscitation          Medications     amLODipine   10 mg Oral Daily   dextrose   25 mL Intravenous Once   enoxaparin  (LOVENOX ) injection  40 mg Subcutaneous Q24H   furosemide   20 mg Oral Daily   LORazepam   0.5 mg Oral BID   rosuvastatin   20 mg Oral QHS   sodium chloride  flush  3 mL Intravenous Q12H     Data Reviewed:   CBG:  Recent Labs  Lab 08/07/24 1129 08/07/24 1648 08/07/24 2045 08/07/24 2358 08/08/24 0402  GLUCAP 240* 259* 182* 161* 130*    SpO2: 100 %    Vitals:   08/06/24 2055 08/07/24 1652 08/07/24 1932 08/08/24 0406  BP: (!) 181/91 (!) 184/108 (!) 188/90 (!) 179/79  Pulse: 62 98 73 62  Resp: 14 16 16 16   Temp: 97.8 F (36.6 C) 97.8 F (36.6 C) 97.7 F (36.5 C) 97.6 F (36.4 C)  TempSrc: Axillary Axillary Oral   SpO2: 100% 100% 99% 100%  Weight:      Height:          Data Reviewed:  Basic Metabolic Panel: Recent Labs  Lab 08/06/24 1254 08/06/24 2243 08/07/24 0516 08/08/24 0519  NA 143  --  144 144  K 4.2  --  3.6 3.8  CL 106  --  104 108  CO2 26  --  30 26  GLUCOSE 117*  --  92 142*  BUN 15  --  10 8  CREATININE 0.92  --  0.73 0.88  CALCIUM  9.8  --  9.2 9.1  MG  --  1.7 2.0  --   PHOS  --  3.1 3.4  --     CBC: Recent Labs  Lab 08/06/24 1350 08/07/24 0516 08/08/24 0519  WBC 5.9 4.8 5.3  NEUTROABS 3.9  --   --   HGB 11.6* 9.8* 11.9*  HCT 37.0 30.9* 37.1  MCV 98.9 97.8 97.1  PLT 346 316 236    LFT Recent Labs  Lab 08/06/24 1254 08/07/24 0516  AST 24 14*  ALT 21 17  ALKPHOS 58 46  BILITOT 0.9 0.6  PROT 7.0 6.2*  ALBUMIN 3.0* 2.6*     Antibiotics: Anti-infectives (From admission, onward)    Start     Dose/Rate Route Frequency Ordered Stop   08/08/24 1800  vancomycin  (VANCOREADY) IVPB 500 mg/100 mL        500 mg 100 mL/hr over 60 Minutes Intravenous Every 12 hours 08/08/24 0758     08/08/24 1330   ceFEPIme  (MAXIPIME ) 2 g in sodium chloride  0.9 % 100 mL IVPB        2 g 200 mL/hr over 30 Minutes Intravenous Every 12 hours 08/08/24 0758     08/07/24 1800  vancomycin  (VANCOCIN ) IVPB 1000 mg/200 mL premix  Status:  Discontinued        1,000 mg 200 mL/hr over 60 Minutes Intravenous Every 24 hours 08/06/24 2235 08/07/24 0832   08/07/24 1800  vancomycin  (VANCOREADY) IVPB 1250 mg/250 mL  Status:  Discontinued        1,250 mg 166.7 mL/hr over 90 Minutes Intravenous Every 24 hours 08/07/24 0832 08/08/24 0758   08/07/24 0930  ceFEPIme  (MAXIPIME ) 2 g in sodium chloride  0.9 % 100 mL IVPB  Status:  Discontinued        2 g 200 mL/hr over 30 Minutes Intravenous Every 8 hours 08/07/24 0830 08/08/24 0758   08/07/24 0000  ceFEPIme  (MAXIPIME ) 2 g in sodium chloride  0.9 % 100 mL IVPB  Status:  Discontinued        2 g 200 mL/hr over 30 Minutes Intravenous Every 12 hours 08/06/24 2235 08/07/24 0830   08/06/24 1800  vancomycin  (VANCOREADY) IVPB 1250 mg/250 mL        1,250 mg 166.7 mL/hr over 90 Minutes Intravenous  Once 08/06/24 1755 08/06/24 2003        DVT prophylaxis: Lovenox   Code Status: DNR  Family Communication: No family at bedside   CONSULTS    Subjective   No new complaints   Objective    Physical Examination:  Appears in no acute distress S1-S2, regular Lungs clear to auscultation bilaterally Abdomen is soft, nontender, no organomegaly   Status is: Inpatient:             [name]   [facility] If 7PM-7AM, please contact night-coverage at [email or web address], Office  [phone]   08/08/2024, 8:25 AM  LOS: 1 day

## 2024-08-08 NOTE — Progress Notes (Signed)
 Pharmacy Antibiotic Note  Mary Swanson is a 68 y.o. female admitted on 08/06/2024 with left leg pain/swelling.  Pharmacy has been consulted for cefepime /vancomycin  dosing for cellulitis. Previously treated with keflex with no improvement.  9/12 AM update: sCr 0.88 (CRCL 56.5 ml/min)   Plan: -adj Cefepime  2g IV every 12 hours -adj Vancomycin  500 mg IV every 12 hours start at 1800 (AUC 442.5, Vd 0.72, IBW, sCr 0.88, Tss ~13.7) -Monitor renal function -Follow up signs of clinical improvement, LOT, de-escalation of antibiotics   Height: 5' 6 (167.6 cm) Weight: 61.2 kg (135 lb) IBW/kg (Calculated) : 59.3  Temp (24hrs), Avg:97.7 F (36.5 C), Min:97.6 F (36.4 C), Max:97.8 F (36.6 C)  Recent Labs  Lab 08/06/24 1254 08/06/24 1350 08/07/24 0516 08/08/24 0519  WBC  --  5.9 4.8 5.3  CREATININE 0.92  --  0.73 0.88    Estimated Creatinine Clearance: 56.5 mL/min (by C-G formula based on SCr of 0.88 mg/dL).    Allergies  Allergen Reactions   Erythromycin Diarrhea   Latex Hives   Lisinopril  Cough    Antimicrobials this admission: Cefepime  9/11 >>  Vancomycin  9/10 >>   Thank you for allowing pharmacy to be a part of this patient's care.  Benedetta Heath BS, PharmD, BCPS Clinical Pharmacist 08/08/2024 7:58 AM  Contact: [phone] after 3 PM

## 2024-08-09 DIAGNOSIS — G253 Myoclonus: Secondary | ICD-10-CM | POA: Diagnosis not present

## 2024-08-09 LAB — GLUCOSE, CAPILLARY
Glucose-Capillary: 158 mg/dL — ABNORMAL HIGH (ref 70–99)
Glucose-Capillary: 143 mg/dL — ABNORMAL HIGH (ref 70–99)
Glucose-Capillary: 138 mg/dL — ABNORMAL HIGH (ref 70–99)
Glucose-Capillary: 135 mg/dL — ABNORMAL HIGH (ref 70–99)
Glucose-Capillary: 148 mg/dL — ABNORMAL HIGH (ref 70–99)
Glucose-Capillary: 150 mg/dL — ABNORMAL HIGH (ref 70–99)

## 2024-08-09 LAB — COMPREHENSIVE METABOLIC PANEL WITH GFR
ALT: 17 U/L (ref 0–44)
AST: 14 U/L — ABNORMAL LOW (ref 15–41)
Albumin: 3 g/dL — ABNORMAL LOW (ref 3.5–5.0)
Alkaline Phosphatase: 46 U/L (ref 38–126)
Anion gap: 14 (ref 5–15)
BUN: 15 mg/dL (ref 8–23)
CO2: 23 mmol/L (ref 22–32)
Calcium: 9.3 mg/dL (ref 8.9–10.3)
Chloride: 106 mmol/L (ref 98–111)
Creatinine, Ser: 0.92 mg/dL (ref 0.44–1.00)
GFR, Estimated: >60 mL/min (ref >60)
Glucose, Bld: 171 mg/dL — ABNORMAL HIGH (ref 70–99)
Potassium: 3.5 mmol/L (ref 3.5–5.1)
Sodium: 143 mmol/L (ref 135–145)
Total Bilirubin: 0.8 mg/dL (ref 0.0–1.2)
Total Protein: 7 g/dL (ref 6.5–8.1)

## 2024-08-09 LAB — CBC WITH DIFFERENTIAL/PLATELET
Abs Immature Granulocytes: 0.04 K/uL (ref 0.00–0.07)
Basophils Absolute: 0 K/uL (ref 0.0–0.1)
Basophils Relative: 1 %
Eosinophils Absolute: 0.2 K/uL (ref 0.0–0.5)
Eosinophils Relative: 3 %
HCT: 38.4 % (ref 36.0–46.0)
Hemoglobin: 12 g/dL (ref 12.0–15.0)
Immature Granulocytes: 1 %
Lymphocytes Relative: 30 %
Lymphs Abs: 1.6 K/uL (ref 0.7–4.0)
MCH: 30.3 pg (ref 26.0–34.0)
MCHC: 31.3 g/dL (ref 30.0–36.0)
MCV: 97 fL (ref 80.0–100.0)
Monocytes Absolute: 0.5 K/uL (ref 0.1–1.0)
Monocytes Relative: 9 %
Neutro Abs: 3 K/uL (ref 1.7–7.7)
Neutrophils Relative %: 56 %
Platelets: 393 K/uL (ref 150–400)
RBC: 3.96 MIL/uL (ref 3.87–5.11)
RDW: 11.9 % (ref 11.5–15.5)
WBC: 5.3 K/uL (ref 4.0–10.5)
nRBC: 0 % (ref 0.0–0.2)

## 2024-08-09 LAB — MAGNESIUM: Magnesium: 1.9 mg/dL (ref 1.7–2.4)

## 2024-08-09 MED ORDER — SODIUM CHLORIDE 0.9 % IV SOLN
1.0000 g | INTRAVENOUS | Status: AC
  Administered 2024-08-10 – 2024-08-12 (×3): 1 g via INTRAVENOUS
  Filled 2024-08-09 (×3): qty 10

## 2024-08-09 MED ORDER — LACTATED RINGERS IV SOLN: INTRAVENOUS | Status: AC

## 2024-08-09 NOTE — Progress Notes (Signed)
 Called by RN to see patient as she was having jerking movements.  Came to see patient and she is alert, nonverbal having intermittent myoclonic jerks involving upper extremities and face.  Vitals:   08/09/24 0753 08/09/24 1854  BP: (!) 140/67 (!) 184/80  Pulse: (!) 56 68  Resp: 17 18  Temp: 97.9 F (36.6 C) 98.5 F (36.9 C)  SpO2: 100% 98%    On exam Moving all extremities, nonverbal, occasional myoclonic jerks  Assessment Myoclonic jerks ?  Seizure  Plan Stat CBC, CMP, magnesium  level Continue amlodipine , metoprolol , patient given hydralazine  Hold Ativan  0.5 mg p.o. twice daily at this time Will discontinue vancomycin  and cefepime , as cellulitis seems to have resolved. Will start ceftriaxone  from tomorrow morning Called and discussed with Dr. Lindzen, neurologist on-call He will see patient in consult  Critical care time spent 40 minutes

## 2024-08-09 NOTE — Progress Notes (Signed)
 [facility]  PROGRESS NOTE  Mary Swanson FMW:[id number] DOB: 08-Jun-1956 DOA: 08/06/2024 PCP: Patient, No Pcp Per   Brief HPI:   68 y.o. female with medical history significant of  Dementia, DM2, HTN HLD     Presented with   left leg pain swellng  Patient coming in from [facility]  with left lower extremity cellulitis. Was treated outpatient with Keflex for the past 2 weeks has been having blisters around left ankle At baseline has severe dementia and nonverbal Left leg has been more swollen And blisters occurred over the past 2 days Keflex was finished yesterday but still seems to be not improving She has been using compression stockings did not seem to be helping recently been stopped Recent x-ray of the foot was unremarkable no fevers at facility she is able to walk unassisted but has been more sedentary lately Patient appears to have diabetes at baseline  Doppler done in ER showed no DVT  Pt was started on vanc    Assessment/Plan:   Alzheimer's dementia with behavioral disturbance (HCC) Monitor for any sundowning  continue home dose of Ativan   Anemia Obtain anemia panel  Transfuse for Hg <7 , rapidly dropping or  if symptomatic     Cellulitis -Started on empiric antibiotics vancomycin  and cefepime  -X-ray of tibia/fibula on left showed no acute fracture or dislocation, diffuse subcutaneous soft tissue edema and blister/nodule along the distal medial calf -Wound care RN consulted -Recommend outpatient podiatry referral for debridement of calluses   DM (diabetes mellitus), type 2 (HCC) Order sliding scale decrease Lantus  down to 10 units due to episode of hypoglycemia -CBG well-controlled  -Continue CBG monitoring every 4 hours   Essential hypertension Continue Lasix ; restart Norvasc  given severe hypertension -Added hydralazine  as needed Started metoprolol  25 mg p.o. twice daily   Hyperlipidemia Continue Crestor  20 mg a day  LikelyChronic  diastolic CHF (congestive heart failure) (HCC) Currently appears to be euvolemic avoid overaggressive fluid resuscitation          Medications     amLODipine   10 mg Oral Daily   dextrose   25 mL Intravenous Once   enoxaparin  (LOVENOX ) injection  40 mg Subcutaneous Q24H   furosemide   20 mg Oral Daily   LORazepam   0.5 mg Oral BID   metoprolol  tartrate  25 mg Oral BID   rosuvastatin   20 mg Oral QHS   sodium chloride  flush  3 mL Intravenous Q12H     Data Reviewed:   CBG:  Recent Labs  Lab 08/08/24 1135 08/08/24 1551 08/08/24 1933 08/08/24 2338 08/09/24 0401  GLUCAP 198* 195* 157* 164* 158*    SpO2: 100 %    Vitals:   08/08/24 1553 08/08/24 1934 08/09/24 0402 08/09/24 0753  BP: (!) 185/66 (!) 186/87 126/75 (!) 140/67  Pulse: 70 69 (!) 48 (!) 56  Resp: 17 16 16 17   Temp: 98.4 F (36.9 C) 98 F (36.7 C) 98.2 F (36.8 C) 97.9 F (36.6 C)  TempSrc: Tympanic     SpO2: 98% 100% 98% 100%  Weight:      Height:          Data Reviewed:  Basic Metabolic Panel: Recent Labs  Lab 08/06/24 1254 08/06/24 2243 08/07/24 0516 08/08/24 0519  NA 143  --  144 144  K 4.2  --  3.6 3.8  CL 106  --  104 108  CO2 26  --  30 26  GLUCOSE 117*  --  92 142*  BUN 15  --  10 8  CREATININE 0.92  --  0.73 0.88  CALCIUM  9.8  --  9.2 9.1  MG  --  1.7 2.0  --   PHOS  --  3.1 3.4  --     CBC: Recent Labs  Lab 08/06/24 1350 08/07/24 0516 08/08/24 0519  WBC 5.9 4.8 5.3  NEUTROABS 3.9  --   --   HGB 11.6* 9.8* 11.9*  HCT 37.0 30.9* 37.1  MCV 98.9 97.8 97.1  PLT 346 316 236    LFT Recent Labs  Lab 08/06/24 1254 08/07/24 0516  AST 24 14*  ALT 21 17  ALKPHOS 58 46  BILITOT 0.9 0.6  PROT 7.0 6.2*  ALBUMIN 3.0* 2.6*     Antibiotics: Anti-infectives (From admission, onward)    Start     Dose/Rate Route Frequency Ordered Stop   08/08/24 1800  vancomycin  (VANCOREADY) IVPB 500 mg/100 mL        500 mg 100 mL/hr over 60 Minutes Intravenous Every 12 hours 08/08/24  0758     08/08/24 1330  ceFEPIme  (MAXIPIME ) 2 g in sodium chloride  0.9 % 100 mL IVPB        2 g 200 mL/hr over 30 Minutes Intravenous Every 12 hours 08/08/24 0758     08/07/24 1800  vancomycin  (VANCOCIN ) IVPB 1000 mg/200 mL premix  Status:  Discontinued        1,000 mg 200 mL/hr over 60 Minutes Intravenous Every 24 hours 08/06/24 2235 08/07/24 0832   08/07/24 1800  vancomycin  (VANCOREADY) IVPB 1250 mg/250 mL  Status:  Discontinued        1,250 mg 166.7 mL/hr over 90 Minutes Intravenous Every 24 hours 08/07/24 0832 08/08/24 0758   08/07/24 0930  ceFEPIme  (MAXIPIME ) 2 g in sodium chloride  0.9 % 100 mL IVPB  Status:  Discontinued        2 g 200 mL/hr over 30 Minutes Intravenous Every 8 hours 08/07/24 0830 08/08/24 0758   08/07/24 0000  ceFEPIme  (MAXIPIME ) 2 g in sodium chloride  0.9 % 100 mL IVPB  Status:  Discontinued        2 g 200 mL/hr over 30 Minutes Intravenous Every 12 hours 08/06/24 2235 08/07/24 0830   08/06/24 1800  vancomycin  (VANCOREADY) IVPB 1250 mg/250 mL        1,250 mg 166.7 mL/hr over 90 Minutes Intravenous  Once 08/06/24 1755 08/06/24 2003        DVT prophylaxis: Lovenox   Code Status: DNR  Family Communication: No family at bedside   CONSULTS    Subjective   Sleeping comfortably.  No new complaints.   Objective    Physical Examination:  Somnolent but arousable S1-S2, regular Extremities-ulcer noted on the medial aspect of left heel   Status is: Inpatient:             Mary Swanson Mary Swanson   [facility] If 7PM-7AM, please contact night-coverage at [email or web address], Office  [phone]   08/09/2024, 8:40 AM  LOS: 2 days

## 2024-08-09 NOTE — Plan of Care (Signed)

## 2024-08-10 ENCOUNTER — Inpatient Hospital Stay (HOSPITAL_COMMUNITY)

## 2024-08-10 DIAGNOSIS — G253 Myoclonus: Secondary | ICD-10-CM | POA: Diagnosis not present

## 2024-08-10 DIAGNOSIS — G9341 Metabolic encephalopathy: Secondary | ICD-10-CM

## 2024-08-10 DIAGNOSIS — G309 Alzheimer's disease, unspecified: Secondary | ICD-10-CM | POA: Diagnosis not present

## 2024-08-10 DIAGNOSIS — L03116 Cellulitis of left lower limb: Secondary | ICD-10-CM | POA: Diagnosis not present

## 2024-08-10 DIAGNOSIS — T361X4A Poisoning by cephalosporins and other beta-lactam antibiotics, undetermined, initial encounter: Secondary | ICD-10-CM | POA: Diagnosis not present

## 2024-08-10 DIAGNOSIS — E119 Type 2 diabetes mellitus without complications: Secondary | ICD-10-CM | POA: Diagnosis not present

## 2024-08-10 DIAGNOSIS — T148XXA Other injury of unspecified body region, initial encounter: Secondary | ICD-10-CM | POA: Diagnosis not present

## 2024-08-10 LAB — AMMONIA: Ammonia: 39 umol/L — ABNORMAL HIGH (ref 9–35)

## 2024-08-10 LAB — GLUCOSE, CAPILLARY
Glucose-Capillary: 127 mg/dL — ABNORMAL HIGH (ref 70–99)
Glucose-Capillary: 111 mg/dL — ABNORMAL HIGH (ref 70–99)
Glucose-Capillary: 181 mg/dL — ABNORMAL HIGH (ref 70–99)
Glucose-Capillary: 175 mg/dL — ABNORMAL HIGH (ref 70–99)
Glucose-Capillary: 194 mg/dL — ABNORMAL HIGH (ref 70–99)

## 2024-08-10 LAB — HIV ANTIBODY (ROUTINE TESTING W REFLEX): HIV Screen 4th Generation wRfx: NONREACTIVE

## 2024-08-10 NOTE — Consult Note (Signed)
 NEUROLOGY CONSULT NOTE   Date of service: August 10, 2024 Patient Name: Mary Swanson MRN:  [id number] DOB:  Mar 14, 1956 Chief Complaint: Mellissa movements Requesting Provider: Drusilla Sabas RAMAN, MD  History of Present Illness  Mary Swanson is a 68 y.o. female with a PMHx of ACE-inhibitor cough, past episode of acute metabolic encephalopathy, AKI, DM, hypernatremia, HTN, right foot cellulitis, Alzheimer's dementia and transaminitis who presented to the hospital from her SNF with LLE cellulitis. She was treated outpatient with Keflex for the past 2 weeks and has been having blisters around left ankle. Keflex was finished on Friday, but she still seems to be not improving. At baseline she has severe dementia and is nonverbal.   Nilsa, she was seen to be having jerking movements. On Hospitalist assessment, she seemed to be having intermittent myoclonic jerks involving her upper extremities and face. Cefepime -induced neurotoxicity was a consideration and this medication has been stopped, along with her vancomycin . Ceftriaxone  was ordered to be started on Sunday morning. Neurology was consulted to further evaluate.     ROS  Unable to obtain due to AMS.  Past History   Past Medical History:  Diagnosis Date   ACE-inhibitor cough 03/14/2019   Acute lower UTI 06/16/2021   Acute metabolic encephalopathy 12/28/2019   AKI (acute kidney injury) (HCC) 12/27/2019   Altered mental status 06/15/2021   Dehydration 12/27/2019   Diabetes mellitus (HCC) 12/28/2019   Diabetes mellitus without complication (HCC)    Diabetic foot infection (HCC) 01/06/2022   Hypernatremia 06/16/2021   Hypertension    Memory loss of unknown cause 12/03/2019   Sepsis due to Streptococcus pyogenes due to right foot cellulitis 01/06/2022   Transaminitis 01/06/2022    Past Surgical History:  Procedure Laterality Date   CATARACT EXTRACTION, BILATERAL Bilateral     Family History: Family History  Problem Relation Age of Onset    Diabetes Neg Hx     Social History  reports that she has never smoked. She has never used smokeless tobacco. She reports that she does not currently use alcohol. She reports that she does not use drugs.  Allergies  Allergen Reactions   Erythromycin Diarrhea   Latex Hives   Lisinopril  Cough    Medications   Current Facility-Administered Medications:    acetaminophen  (TYLENOL ) tablet 650 mg, 650 mg, Oral, Q6H PRN, 650 mg at 08/07/24 1652 **OR** acetaminophen  (TYLENOL ) suppository 650 mg, 650 mg, Rectal, Q6H PRN, Doutova, Anastassia, MD   amLODipine  (NORVASC ) tablet 10 mg, 10 mg, Oral, Daily, Doutova, Anastassia, MD, 10 mg at 08/09/24 9163   cefTRIAXone  (ROCEPHIN ) 1 g in sodium chloride  0.9 % 100 mL IVPB, 1 g, Intravenous, Q24H, Lama, Sabas RAMAN, MD   enoxaparin  (LOVENOX ) injection 40 mg, 40 mg, Subcutaneous, Q24H, Doutova, Anastassia, MD, 40 mg at 08/09/24 0836   hydrALAZINE  (APRESOLINE ) tablet 25 mg, 25 mg, Oral, Q6H PRN, Drusilla Sabas RAMAN, MD, 25 mg at 08/09/24 1857   HYDROcodone -acetaminophen  (NORCO/VICODIN) 5-325 MG per tablet 1-2 tablet, 1-2 tablet, Oral, Q4H PRN, Doutova, Anastassia, MD   lactated ringers  infusion, , Intravenous, Continuous, Drusilla Sabas RAMAN, MD, Last Rate: 75 mL/hr at 08/09/24 1825, New Bag at 08/09/24 1825   metoprolol  tartrate (LOPRESSOR ) tablet 25 mg, 25 mg, Oral, BID, Drusilla, Gagan S, MD, 25 mg at 08/09/24 2111   ondansetron  (ZOFRAN ) tablet 4 mg, 4 mg, Oral, Q6H PRN **OR** ondansetron  (ZOFRAN ) injection 4 mg, 4 mg, Intravenous, Q6H PRN, Doutova, Anastassia, MD   rosuvastatin  (CRESTOR ) tablet 20 mg, 20 mg, Oral, QHS,  Doutova, Anastassia, MD, 20 mg at 08/09/24 2111   sodium chloride  flush (NS) 0.9 % injection 3 mL, 3 mL, Intravenous, Q12H, Doutova, Anastassia, MD, 3 mL at 08/09/24 2114   sodium chloride  flush (NS) 0.9 % injection 3 mL, 3 mL, Intravenous, PRN, Doutova, Anastassia, MD  Vitals   Vitals:   08/09/24 0402 08/09/24 0753 08/09/24 1854 08/09/24 1947  BP: 126/75  (!) 140/67 (!) 184/80 (!) 163/80  Pulse: (!) 48 (!) 56 68 72  Resp: 16 17 18 16   Temp: 98.2 F (36.8 C) 97.9 F (36.6 C) 98.5 F (36.9 C) 97.8 F (36.6 C)  TempSrc:   Tympanic   SpO2: 98% 100% 98% 99%  Weight:      Height:        Body mass index is 21.79 kg/m.   Physical Exam   Constitutional: Elderly and frail-appearing female in NAD. Appears significantly older than her chronological age.  Psych: Abulic Eyes: No scleral injection.  HENT: No OP obstruction.  Head: Normocephalic. In the context of mixed spasticity and rigidity of all 4 limbs, the patient has similar findings on passive rotation and flexion/extension of her neck. No meningismus noted in this context, with negative Kernig's and Brudzinski's signs Respiratory: Effort normal, non-labored breathing.   Neurologic Examination   Mental Status: Sleeping initially, she is arousable after extended attempts to awaken including light sternal rub and noxious plantar stimulation. While awake, she is stuporous/abulic. Nonverbal. Not responding to any verbal or pantomimed commands. Will not make eye contact but will gaze in direction of examiner's face at times after noxious stimuli. Does not withdraw from noxious, but does exhibit significant stimulus-induced myoclonus (see below).   Cranial Nerves: II: Blinks to threat in both eyes inconsistently. PERRL  III,IV, VI: No ptosis. Eyes are midline and conjugate. No nystagmus. Will gaze at examiner's face at times.   V: Reacts to eyelid stimulation bilaterally  VII: Grimace is symmetric VIII: Does not respond to voice IX,X: Gag reflex deferred XI: Head is midline XII: Unable to assess tongue protrusion. Motor: Does not withdraw from noxious to upper extremities, but will move slightly. Does exhibit significant stimulus-induced myoclonus, with flexion at wrists, elbows, shoulders, hips and knees symmetrically. No hyperekplexia to loud clapping. Myoclonus is for the most part  bilaterally synchronous. The myoclonus can also be induced by rapid flexion-extension of her limbs with overlapping clasp-knife spasticity and rigidity. No tremor noted. The findings also suggest possible severe asterixis.  When passively elevated and released, BUE drift slowly to the bed. Similar findings for BLE.  Decreased muscle mass throughout. Increased tone as described above, is symmetric.  Sensory: Reacts to noxious x 4 by grimacing and with myoclonic-like movements.   Deep Tendon Reflexes: 1+ and symmetric throughout, except for absent achilles tendon reflexes. No clonus with passive dorsiflexion of feet. Toes equivocal.  Cerebellar: Unable to assess  Gait: Unable to assess  Labs/Imaging/Neurodiagnostic studies   CBC:  Recent Labs  Lab 08-14-24 1350 08/07/24 0516 08/08/24 0519 08/09/24 2026  WBC 5.9   < > 5.3 5.3  NEUTROABS 3.9  --   --  3.0  HGB 11.6*   < > 11.9* 12.0  HCT 37.0   < > 37.1 38.4  MCV 98.9   < > 97.1 97.0  PLT 346   < > 236 393   < > = values in this interval not displayed.   Basic Metabolic Panel:  Lab Results  Component Value Date   NA 143 08/09/2024  K 3.5 08/09/2024   CO2 23 08/09/2024   GLUCOSE 171 (H) 08/09/2024   BUN 15 08/09/2024   CREATININE 0.92 08/09/2024   CALCIUM  9.3 08/09/2024   GFRNONAA >60 08/09/2024   GFRAA >60 01/01/2020   Lipid Panel:  Lab Results  Component Value Date   LDLCALC 80 10/01/2019   HgbA1c:  Lab Results  Component Value Date   HGBA1C 5.7 (H) 08/06/2024   INR  Lab Results  Component Value Date   INR 1.2 01/06/2022   APTT  Lab Results  Component Value Date   APTT 30 01/06/2022     ASSESSMENT  68 y.o. female with a PMHx of ACE-inhibitor cough, past episode of acute metabolic encephalopathy, AKI, DM, hypernatremia, HTN, right foot cellulitis, Alzheimer's dementia and transaminitis who presented to the hospital from her SNF with LLE cellulitis. She was treated outpatient with Keflex for the past 2 weeks  and has been having blisters around left ankle. Keflex was finished on Friday, but she still seems to be not improving. At baseline she has severe dementia and is nonverbal. Nilsa, she was seen to be having jerking movements. On Hospitalist assessment, she seemed to be having intermittent myoclonic jerks involving her upper extremities and face. Cefepime -induced neurotoxicity was a consideration and this medication has been stopped, along with her vancomycin . Ceftriaxone  was ordered to be started on Sunday morning. Neurology was consulted to further evaluate.  - Exam reveals findings suggestive of advanced dementia, now manifesting with motor findings of rigidity and spasticity. The myoclonus and possible asterixis, however, may be due to an acute process, such as an overlapping toxidrome, seizures or a metabolic or infectious encephalopathy.  - B12 is low at 282. WBC normal.  - Impression: Myoclonus with possible asterixis. Agree with Hospitalist that Cefepime -induced neurotoxicity is a consideration. A metabolic encephalopathy is also possible. New onset seizures are less likely as the current presentation would be atypical for this. Overall presentation does not favor a meningitis or encephalitis.   RECOMMENDATIONS  - Agree with stopping cefepime . Observe for possible resolution of her myoclonus. This may take some time, as effects of cefepime  neurotoxicity often take several days to resolve.  - Hospitalist service is starting ceftriaxone  for her infection today. No objections from a neurological standpoint - Spot EEG has been ordered.  - Ammonia level.  - RPR - Start B12 supplementation - MRI brain w/wo contrast.   ______________________________________________________________________    Bonney SHARK, Eriberto Felch, MD [facility]

## 2024-08-10 NOTE — Progress Notes (Signed)
 [facility]  PROGRESS NOTE  Mary Swanson FMW:[id number] DOB: Apr 25, 1956 DOA: 08/06/2024 PCP: Patient, No Pcp Per   Brief HPI:   68 y.o. female with medical history significant of  Dementia, DM2, HTN HLD     Presented with   left leg pain swellng  Patient coming in from [facility]  with left lower extremity cellulitis. Was treated outpatient with Keflex for the past 2 weeks has been having blisters around left ankle At baseline has severe dementia and nonverbal Left leg has been more swollen And blisters occurred over the past 2 days Keflex was finished yesterday but still seems to be not improving She has been using compression stockings did not seem to be helping recently been stopped Recent x-ray of the foot was unremarkable no fevers at facility she is able to walk unassisted but has been more sedentary lately Patient appears to have diabetes at baseline  Doppler done in ER showed no DVT  Pt was started on vanc    Assessment/Plan:   Alzheimer's dementia with behavioral disturbance (HCC) Monitor for any sundowning  continue home dose of Ativan   Myoclonic jerks - Developed myoclonic jerks yesterday, unclear etiology - Neurology was consulted yesterday - Cefepime  discontinued - EEG, ammonia level, MRI brain with and without contrast ordered - Will follow the results  Anemia Obtain anemia panel  Transfuse for Hg <7 , rapidly dropping or  if symptomatic     Cellulitis -Started on empiric antibiotics vancomycin  and cefepime  -X-ray of tibia/fibula on left showed no acute fracture or dislocation, diffuse subcutaneous soft tissue edema and blister/nodule along the distal medial calf -Wound care RN consulted -Recommend outpatient podiatry referral for debridement of calluses   DM (diabetes mellitus), type 2 (HCC) Order sliding scale decrease Lantus  down to 10 units due to episode of hypoglycemia -CBG well-controlled  -Continue CBG monitoring every 4  hours   Essential hypertension Continue Lasix ; restart Norvasc  given severe hypertension -Added hydralazine  as needed Started metoprolol  25 mg p.o. twice daily   Hyperlipidemia Continue Crestor  20 mg a day  LikelyChronic diastolic CHF (congestive heart failure) (HCC) Currently appears to be euvolemic avoid overaggressive fluid resuscitation          Medications     amLODipine   10 mg Oral Daily   enoxaparin  (LOVENOX ) injection  40 mg Subcutaneous Q24H   metoprolol  tartrate  25 mg Oral BID   rosuvastatin   20 mg Oral QHS   sodium chloride  flush  3 mL Intravenous Q12H     Data Reviewed:   CBG:  Recent Labs  Lab 08/09/24 1934 08/09/24 2321 08/10/24 0411 08/10/24 0817 08/10/24 1127  GLUCAP 148* 150* 127* 111* 181*    SpO2: 98 %    Vitals:   08/09/24 1854 08/09/24 1947 08/10/24 0409 08/10/24 0816  BP: (!) 184/80 (!) 163/80 (!) 165/74 (!) 165/65  Pulse: 68 72 (!) 55 (!) 54  Resp: 18 16 16 15   Temp: 98.5 F (36.9 C) 97.8 F (36.6 C) 98 F (36.7 C) 97.8 F (36.6 C)  TempSrc: Tympanic     SpO2: 98% 99% 99% 98%  Weight:      Height:          Data Reviewed:  Basic Metabolic Panel: Recent Labs  Lab 08/06/24 1254 08/06/24 2243 08/07/24 0516 08/08/24 0519 08/09/24 2026  NA 143  --  144 144 143  K 4.2  --  3.6 3.8 3.5  CL 106  --  104 108 106  CO2 26  --  30 26 23   GLUCOSE 117*  --  92 142* 171*  BUN 15  --  10 8 15   CREATININE 0.92  --  0.73 0.88 0.92  CALCIUM  9.8  --  9.2 9.1 9.3  MG  --  1.7 2.0  --  1.9  PHOS  --  3.1 3.4  --   --     CBC: Recent Labs  Lab 08/06/24 1350 08/07/24 0516 08/08/24 0519 08/09/24 2026  WBC 5.9 4.8 5.3 5.3  NEUTROABS 3.9  --   --  3.0  HGB 11.6* 9.8* 11.9* 12.0  HCT 37.0 30.9* 37.1 38.4  MCV 98.9 97.8 97.1 97.0  PLT 346 316 236 393    LFT Recent Labs  Lab 08/06/24 1254 08/07/24 0516 08/09/24 2026  AST 24 14* 14*  ALT 21 17 17   ALKPHOS 58 46 46  BILITOT 0.9 0.6 0.8  PROT 7.0 6.2* 7.0  ALBUMIN  3.0* 2.6* 3.0*     Antibiotics: Anti-infectives (From admission, onward)    Start     Dose/Rate Route Frequency Ordered Stop   08/10/24 1000  cefTRIAXone  (ROCEPHIN ) 1 g in sodium chloride  0.9 % 100 mL IVPB        1 g 200 mL/hr over 30 Minutes Intravenous Every 24 hours 08/09/24 1928     08/08/24 1800  vancomycin  (VANCOREADY) IVPB 500 mg/100 mL  Status:  Discontinued        500 mg 100 mL/hr over 60 Minutes Intravenous Every 12 hours 08/08/24 0758 08/09/24 1918   08/08/24 1330  ceFEPIme  (MAXIPIME ) 2 g in sodium chloride  0.9 % 100 mL IVPB  Status:  Discontinued        2 g 200 mL/hr over 30 Minutes Intravenous Every 12 hours 08/08/24 0758 08/09/24 1918   08/07/24 1800  vancomycin  (VANCOCIN ) IVPB 1000 mg/200 mL premix  Status:  Discontinued        1,000 mg 200 mL/hr over 60 Minutes Intravenous Every 24 hours 08/06/24 2235 08/07/24 0832   08/07/24 1800  vancomycin  (VANCOREADY) IVPB 1250 mg/250 mL  Status:  Discontinued        1,250 mg 166.7 mL/hr over 90 Minutes Intravenous Every 24 hours 08/07/24 0832 08/08/24 0758   08/07/24 0930  ceFEPIme  (MAXIPIME ) 2 g in sodium chloride  0.9 % 100 mL IVPB  Status:  Discontinued        2 g 200 mL/hr over 30 Minutes Intravenous Every 8 hours 08/07/24 0830 08/08/24 0758   08/07/24 0000  ceFEPIme  (MAXIPIME ) 2 g in sodium chloride  0.9 % 100 mL IVPB  Status:  Discontinued        2 g 200 mL/hr over 30 Minutes Intravenous Every 12 hours 08/06/24 2235 08/07/24 0830   08/06/24 1800  vancomycin  (VANCOREADY) IVPB 1250 mg/250 mL        1,250 mg 166.7 mL/hr over 90 Minutes Intravenous  Once 08/06/24 1755 08/06/24 2003        DVT prophylaxis: Lovenox   Code Status: DNR  Family Communication: No family at bedside   CONSULTS    Subjective   Continues to have myoclonic jerks.  Appreciate neurology input.   Objective    Physical Examination:  Patient is nonverbal S1-S2, regular Lungs clear to auscultation bilaterally Abdomen is soft, nontender,  no organomegaly   Status is: Inpatient:             Mary Swanson   [facility] If 7PM-7AM, please contact night-coverage at [email or web address], Office  [phone]   08/10/2024,  1:51 PM  LOS: 3 days

## 2024-08-10 NOTE — Progress Notes (Signed)
 EEG complete - results pending

## 2024-08-11 ENCOUNTER — Inpatient Hospital Stay (HOSPITAL_COMMUNITY)

## 2024-08-11 DIAGNOSIS — R569 Unspecified convulsions: Secondary | ICD-10-CM

## 2024-08-11 DIAGNOSIS — E119 Type 2 diabetes mellitus without complications: Secondary | ICD-10-CM | POA: Diagnosis not present

## 2024-08-11 DIAGNOSIS — T361X4A Poisoning by cephalosporins and other beta-lactam antibiotics, undetermined, initial encounter: Secondary | ICD-10-CM | POA: Diagnosis not present

## 2024-08-11 DIAGNOSIS — T148XXA Other injury of unspecified body region, initial encounter: Secondary | ICD-10-CM | POA: Diagnosis not present

## 2024-08-11 DIAGNOSIS — L03116 Cellulitis of left lower limb: Secondary | ICD-10-CM | POA: Diagnosis not present

## 2024-08-11 DIAGNOSIS — G309 Alzheimer's disease, unspecified: Secondary | ICD-10-CM | POA: Diagnosis not present

## 2024-08-11 DIAGNOSIS — R4182 Altered mental status, unspecified: Secondary | ICD-10-CM

## 2024-08-11 LAB — GLUCOSE, CAPILLARY
Glucose-Capillary: 105 mg/dL — ABNORMAL HIGH (ref 70–99)
Glucose-Capillary: 143 mg/dL — ABNORMAL HIGH (ref 70–99)
Glucose-Capillary: 178 mg/dL — ABNORMAL HIGH (ref 70–99)
Glucose-Capillary: 233 mg/dL — ABNORMAL HIGH (ref 70–99)
Glucose-Capillary: 335 mg/dL — ABNORMAL HIGH (ref 70–99)

## 2024-08-11 LAB — RPR: RPR Ser Ql: NONREACTIVE

## 2024-08-11 MED ORDER — METOPROLOL TARTRATE 12.5 MG HALF TABLET
12.5000 mg | ORAL_TABLET | Freq: Two times a day (BID) | ORAL | Status: DC
  Administered 2024-08-11 – 2024-08-13 (×5): 12.5 mg via ORAL
  Filled 2024-08-11 (×4): qty 1

## 2024-08-11 MED ORDER — INSULIN ASPART 100 UNIT/ML IJ SOLN
0.0000 [IU] | Freq: Three times a day (TID) | INTRAMUSCULAR | Status: DC
  Administered 2024-08-11 – 2024-08-12 (×2): 3 [IU] via SUBCUTANEOUS
  Administered 2024-08-12: 1 [IU] via SUBCUTANEOUS
  Administered 2024-08-12: 7 [IU] via SUBCUTANEOUS
  Administered 2024-08-13 (×2): 2 [IU] via SUBCUTANEOUS

## 2024-08-11 MED ORDER — VITAMIN B-12 1000 MCG PO TABS
1000.0000 ug | ORAL_TABLET | Freq: Every day | ORAL | Status: DC
  Administered 2024-08-11 – 2024-08-13 (×3): 1000 ug via ORAL
  Filled 2024-08-11 (×3): qty 1

## 2024-08-11 MED ORDER — METOPROLOL TARTRATE 12.5 MG HALF TABLET: 12.5000 mg | ORAL_TABLET | Freq: Two times a day (BID) | ORAL | Status: DC

## 2024-08-11 MED ORDER — LORAZEPAM 0.5 MG PO TABS
0.5000 mg | ORAL_TABLET | Freq: Two times a day (BID) | ORAL | Status: DC
  Administered 2024-08-11 – 2024-08-13 (×4): 0.5 mg via ORAL
  Filled 2024-08-11 (×4): qty 1

## 2024-08-11 NOTE — Care Management Important Message (Signed)
 Important Message  Patient Details  Name: Mary Swanson MRN: [id number] Date of Birth: [birth year]   Important Message Given:  Yes - Medicare IM     Jon Cruel 08/11/2024, 11:55 AM

## 2024-08-11 NOTE — Procedures (Signed)
 Patient Name: Mary Swanson  MRN: [id number]  Epilepsy Attending: Arlin MALVA Krebs  Referring Physician/Provider: Voncile Isles, MD  Date: 08/11/2024 Duration: 22.29 mins  Patient history:  68 y.o. female with a history of altered mental status who is undergoing an EEG to evaluate for seizures.   Level of alertness: Awake  AEDs during EEG study: Ativan   Technical aspects: This EEG study was done with scalp electrodes positioned according to the 10-20 International system of electrode placement. Electrical activity was reviewed with band pass filter of 1-70Hz , sensitivity of 7 uV/mm, display speed of 51mm/sec with a 60Hz  notched filter applied as appropriate. EEG data were recorded continuously and digitally stored.  Video monitoring was available and reviewed as appropriate.  Description: EEG showed continuous generalized 3 to 6 Hz theta-delta slowing, a times with triphasic morphology Hyperventilation and photic stimulation were not performed.     Intermittently during the study, patient was  noted to have brief arm/shoulder jerking. Concomitant eeg before, during and after the event didn't show any eeg change to suggest seizure  ABNORMALITY - Continuous slow, generalized  IMPRESSION: This study is suggestive of moderate diffuse encephalopathy. No seizures or epileptiform discharges were seen throughout the recording.  Intermittently during the study, patient was  noted to have brief arm/shoulder jerking without concomitant eeg change. These events were NON epileptic  EEG appears to have improved compared to yesterday  Meylin Stenzel O Domenica Weightman

## 2024-08-11 NOTE — Inpatient Diabetes Management (Signed)
 Inpatient Diabetes Program Recommendations  AACE/ADA: New Consensus Statement on Inpatient Glycemic Control (2015)  Target Ranges:  Prepandial:   less than 140 mg/dL      Peak postprandial:   less than 180 mg/dL (1-2 hours)      Critically ill patients:  140 - 180 mg/dL   Lab Results  Component Value Date   GLUCAP 335 (H) 08/11/2024   HGBA1C 5.7 (H) 08/06/2024    Review of Glycemic Control  Latest Reference Range & Units 08/10/24 08:17 08/10/24 11:27 08/10/24 17:28 08/10/24 20:19 08/11/24 00:01 08/11/24 04:09 08/11/24 11:31  Glucose-Capillary 70 - 99 mg/dL 888 (H) 818 (H) 824 (H) 194 (H) 178 (H) 143 (H) 335 (H)   Diabetes history: DM2 Outpatient Diabetes medications:  Lantus  18 units daily Metformin  1000 mg bid Current orders for Inpatient glycemic control:  None  Inpatient Diabetes Program Recommendations:    If appropriate, consider adding Novolog  0-6 units tid with meals and HS scale.   Thanks,   Randall Bullocks, RN, BC-ADM Inpatient Diabetes Coordinator Pager [phone]  (8a-5p)

## 2024-08-11 NOTE — Procedures (Signed)
 Routine EEG Report  Mary Swanson is a 68 y.o. female with a history of altered mental status who is undergoing an EEG to evaluate for seizures.  Report: This EEG was acquired with electrodes placed according to the International 10-20 electrode system (including Fp1, Fp2, F3, F4, C3, C4, P3, P4, O1, O2, T3, T4, T5, T6, A1, A2, Fz, Cz, Pz). The following electrodes were missing or displaced: none.  The occipital dominant rhythm was 3-4 Hz with overriding GPDs at 1 Hz. This activity is reactive to stimulation. Drowsiness was manifested by background fragmentation; deeper stages of sleep were not identified. There was no focal slowing. There were no electrographic seizures identified. There was no abnormal response to photic stimulation or hyperventilation.   Impression and clinical correlation: This EEG was obtained while awake and drowsy and is abnormal due to severe diffuse slowing indicative of global cerebral dysfunction. GPDs at 1 Hz are on the ictal-interictal spectrum with mild-to-moderate potential for seizures. This pattern may be secondary to residual effects from cefepime . Recommend repeat routine EEG tomorrow.  Epileptiform abnormalities were not seen during this recording.  Elida Ross, MD [facility] [phone]  If 7pm- 7am, please page neurology on call as listed in AMION.

## 2024-08-11 NOTE — Progress Notes (Signed)
 NEUROLOGY CONSULT FOLLOW UP NOTE   Date of service: August 11, 2024 Patient Name: Mary Swanson MRN:  [id number] DOB:  05-24-1956  Interval Hx/subjective   No family at the bedside. Patient laying in bed in NAD. Routine EEG with severe diffuse slowing indicative of global cerebral dysfunction   Vitals   Vitals:   08/10/24 1732 08/10/24 1934 08/11/24 0412 08/11/24 0728  BP: (!) 142/122 (!) 140/79 (!) 151/60 (!) 127/108  Pulse: 81 73 (!) 56   Resp: 16 13 14 16   Temp: 98.4 F (36.9 C) 98.4 F (36.9 C) 97.6 F (36.4 C) (!) 97.5 F (36.4 C)  TempSrc:  Oral  Oral  SpO2: 100% 100% 99% 95%  Weight:      Height:         Body mass index is 21.79 kg/m.  Physical Exam   Constitutional: chronically ill frail elderly female.  Eyes: No scleral injection.  HENT: No OP obstrucion.  Head: Normocephalic.  Cardiovascular: Normal rate and regular rhythm.  Respiratory: Effort normal, non-labored breathing.  GI: Soft.  No distension. There is no tenderness.  Skin: WDI.   Neurologic Examination    Mental Status -  Eyes are closed, opens eyes to repeated stimulation, tracks examiner at times, non verbal, does not follow commands  Cranial Nerves II - XII - II - Visual field intact blinks to threat . III, IV, VI - tracks examiner at times  V - Facial sensation intact bilaterally . VII - Facial movement intact bilaterally . X - unable to assess  XII - unable to assess   Motor Strength - bilateral upper arms are flexed to chest, both arms drift to the bed, bilateral lowers drift to the bed, does not lift off the bed  Motor Tone - increased tone  Sensory - grimaces  Coordination - unable to asses\s Gait and Station - deferred.  Medications  Current Facility-Administered Medications:    acetaminophen  (TYLENOL ) tablet 650 mg, 650 mg, Oral, Q6H PRN, 650 mg at 08/07/24 1652 **OR** acetaminophen  (TYLENOL ) suppository 650 mg, 650 mg, Rectal, Q6H PRN, Doutova, Anastassia, MD    amLODipine  (NORVASC ) tablet 10 mg, 10 mg, Oral, Daily, Doutova, Anastassia, MD, 10 mg at 08/11/24 1017   cefTRIAXone  (ROCEPHIN ) 1 g in sodium chloride  0.9 % 100 mL IVPB, 1 g, Intravenous, Q24H, Drusilla, Gagan S, MD, Last Rate: 200 mL/hr at 08/11/24 1019, 1 g at 08/11/24 1019   enoxaparin  (LOVENOX ) injection 40 mg, 40 mg, Subcutaneous, Q24H, Doutova, Anastassia, MD, 40 mg at 08/11/24 1018   hydrALAZINE  (APRESOLINE ) tablet 25 mg, 25 mg, Oral, Q6H PRN, Drusilla Sabas RAMAN, MD, 25 mg at 08/09/24 1857   HYDROcodone -acetaminophen  (NORCO/VICODIN) 5-325 MG per tablet 1-2 tablet, 1-2 tablet, Oral, Q4H PRN, Doutova, Anastassia, MD, 1 tablet at 08/11/24 1017   metoprolol  tartrate (LOPRESSOR ) tablet 12.5 mg, 12.5 mg, Oral, BID, Drusilla, Gagan S, MD, 12.5 mg at 08/11/24 1028   ondansetron  (ZOFRAN ) tablet 4 mg, 4 mg, Oral, Q6H PRN **OR** ondansetron  (ZOFRAN ) injection 4 mg, 4 mg, Intravenous, Q6H PRN, Doutova, Anastassia, MD   rosuvastatin  (CRESTOR ) tablet 20 mg, 20 mg, Oral, QHS, Doutova, Anastassia, MD, 20 mg at 08/10/24 2227   sodium chloride  flush (NS) 0.9 % injection 3 mL, 3 mL, Intravenous, Q12H, Doutova, Anastassia, MD, 3 mL at 08/11/24 1020   sodium chloride  flush (NS) 0.9 % injection 3 mL, 3 mL, Intravenous, PRN, Doutova, Anastassia, MD  Labs and Diagnostic Imaging   CBC:  Recent Labs  Lab 08/06/24 1350 08/07/24  9483 08/08/24 0519 08/09/24 2026  WBC 5.9   < > 5.3 5.3  NEUTROABS 3.9  --   --  3.0  HGB 11.6*   < > 11.9* 12.0  HCT 37.0   < > 37.1 38.4  MCV 98.9   < > 97.1 97.0  PLT 346   < > 236 393   < > = values in this interval not displayed.    Basic Metabolic Panel:  Lab Results  Component Value Date   NA 143 08/09/2024   K 3.5 08/09/2024   CO2 23 08/09/2024   GLUCOSE 171 (H) 08/09/2024   BUN 15 08/09/2024   CREATININE 0.92 08/09/2024   CALCIUM  9.3 08/09/2024   GFRNONAA >60 08/09/2024   GFRAA >60 01/01/2020   Lipid Panel:  Lab Results  Component Value Date   LDLCALC 80 10/01/2019    HgbA1c:  Lab Results  Component Value Date   HGBA1C 5.7 (H) 08/06/2024   Urine Drug Screen:     Component Value Date/Time   LABOPIA NONE DETECTED 12/27/2019 1635   COCAINSCRNUR NONE DETECTED 12/27/2019 1635   LABBENZ NONE DETECTED 12/27/2019 1635   AMPHETMU NONE DETECTED 12/27/2019 1635   THCU NONE DETECTED 12/27/2019 1635   LABBARB NONE DETECTED 12/27/2019 1635    Alcohol Level     Component Value Date/Time   ETH <10 12/27/2019 1720   INR  Lab Results  Component Value Date   INR 1.2 01/06/2022   APTT  Lab Results  Component Value Date   APTT 30 01/06/2022    rEEG performed on 08/10/2024, read on 9/15:  abnormal due to severe diffuse slowing indicative of global cerebral dysfunction. GPDs at 1 Hz are on the ictal-interictal spectrum with mild-to-moderate potential for seizures. This pattern may be secondary to residual effects from cefepime    Labs  Ammonia 39 RPR nonreactive  B12 282    Assessment   Mary Swanson is a 68 y.o. female  PMHx of ACE-inhibitor cough, past episode of acute metabolic encephalopathy, AKI, DM, hypernatremia, HTN, right foot cellulitis, Alzheimer's dementia and transaminitis who presented to the hospital from her SNF with LLE cellulitis. She was treated outpatient with Keflex for the past 2 weeks and has been having blisters around left ankle. Keflex was finished on Friday, but she still did not seem to be improved.  At baseline she has severe dementia and is nonverbal.  Today she was tracking the examiner.  Her routine EEG yesterday was concerning for cefepime  toxicity pattern. We will repeat an EEG although clinically there has been no further jerking movement noticed.  Impression: Possible cefepime  toxicity  Recommendations  Recommend B12 supplementation  Continue to avoid using cefepime  Routine EEG Will follow the routine EEG with you Plan was relayed to Dr.  Drusilla  ______________________________________________________________________   Signed, Karna DELENA Geralds, NP [facility]   Attending Neurohospitalist Addendum Patient seen and examined with APP/Resident. Agree with the history and physical as documented above. Agree with the plan as documented, which I helped formulate. I have independently reviewed the chart, obtained history, review of systems and examined the patient.I have personally reviewed pertinent head/neck/spine imaging (CT/MRI). Please feel free to call with any questions.  -- Eligio Lav, MD Neurologist [facility] Pager: [phone]

## 2024-08-11 NOTE — Progress Notes (Signed)
 [facility]  PROGRESS NOTE  Mary Swanson FMW:[id number] DOB: 1956-10-01 DOA: 08/06/2024 PCP: Patient, No Pcp Per   Brief HPI:   68 y.o. female with medical history significant of  Dementia, DM2, HTN HLD     Presented with   left leg pain swellng  Patient coming in from [facility]  with left lower extremity cellulitis. Was treated outpatient with Keflex for the past 2 weeks has been having blisters around left ankle At baseline has severe dementia and nonverbal Left leg has been more swollen And blisters occurred over the past 2 days Keflex was finished yesterday but still seems to be not improving She has been using compression stockings did not seem to be helping recently been stopped Recent x-ray of the foot was unremarkable no fevers at facility she is able to walk unassisted but has been more sedentary lately Patient appears to have diabetes at baseline  Doppler done in ER showed no DVT  Pt was started on vanc    Assessment/Plan:   Alzheimer's dementia with behavioral disturbance (HCC) Monitor for any sundowning  continue home dose of Ativan   Myoclonic jerks - Developed myoclonic jerks yesterday, unclear etiology - Neurology was consulted  - Cefepime  discontinued - EEG was concerning for cefepime  toxicity pattern -Repeat EEG ordered -Will order MRI brain with and without contrast -Neurology following  B12 deficiency - Will start with B12 supplementation  Anemia Obtain anemia panel  Transfuse for Hg <7 , rapidly dropping or  if symptomatic     Cellulitis -Started on empiric antibiotics vancomycin  and cefepime  -X-ray of tibia/fibula on left showed no acute fracture or dislocation, diffuse subcutaneous soft tissue edema and blister/nodule along the distal medial calf -Wound care RN consulted -Recommend outpatient podiatry referral for debridement of calluses   DM (diabetes mellitus), type 2 (HCC) Order sliding scale decrease Lantus  down to 10  units due to episode of hypoglycemia -CBG has been elevated -Will start sliding scale insulin  with NovoLog    Essential hypertension Continue Lasix ; restart Norvasc  given severe hypertension -Added hydralazine  as needed Started metoprolol  25 mg p.o. twice daily -Report of the dose of metoprolol  to 12.5 mg p.o. twice daily due to bradycardia   Hyperlipidemia Continue Crestor  20 mg a day  LikelyChronic diastolic CHF (congestive heart failure) (HCC) Currently appears to be euvolemic avoid overaggressive fluid resuscitation          Medications     amLODipine   10 mg Oral Daily   enoxaparin  (LOVENOX ) injection  40 mg Subcutaneous Q24H   metoprolol  tartrate  25 mg Oral BID   rosuvastatin   20 mg Oral QHS   sodium chloride  flush  3 mL Intravenous Q12H     Data Reviewed:   CBG:  Recent Labs  Lab 08/10/24 1127 08/10/24 1728 08/10/24 2019 08/11/24 0001 08/11/24 0409  GLUCAP 181* 175* 194* 178* 143*    SpO2: 95 %    Vitals:   08/10/24 1732 08/10/24 1934 08/11/24 0412 08/11/24 0728  BP: (!) 142/122 (!) 140/79 (!) 151/60 (!) 127/108  Pulse: 81 73 (!) 56   Resp: 16 13 14 16   Temp: 98.4 F (36.9 C) 98.4 F (36.9 C) 97.6 F (36.4 C) (!) 97.5 F (36.4 C)  TempSrc:  Oral  Oral  SpO2: 100% 100% 99% 95%  Weight:      Height:          Data Reviewed:  Basic Metabolic Panel: Recent Labs  Lab 08/06/24 1254 08/06/24 2243 08/07/24 0516 08/08/24 0519 08/09/24  2026  NA 143  --  144 144 143  K 4.2  --  3.6 3.8 3.5  CL 106  --  104 108 106  CO2 26  --  30 26 23   GLUCOSE 117*  --  92 142* 171*  BUN 15  --  10 8 15   CREATININE 0.92  --  0.73 0.88 0.92  CALCIUM  9.8  --  9.2 9.1 9.3  MG  --  1.7 2.0  --  1.9  PHOS  --  3.1 3.4  --   --     CBC: Recent Labs  Lab 08/06/24 1350 08/07/24 0516 08/08/24 0519 08/09/24 2026  WBC 5.9 4.8 5.3 5.3  NEUTROABS 3.9  --   --  3.0  HGB 11.6* 9.8* 11.9* 12.0  HCT 37.0 30.9* 37.1 38.4  MCV 98.9 97.8 97.1 97.0  PLT 346  316 236 393    LFT Recent Labs  Lab 08/06/24 1254 08/07/24 0516 08/09/24 2026  AST 24 14* 14*  ALT 21 17 17   ALKPHOS 58 46 46  BILITOT 0.9 0.6 0.8  PROT 7.0 6.2* 7.0  ALBUMIN 3.0* 2.6* 3.0*     Antibiotics: Anti-infectives (From admission, onward)    Start     Dose/Rate Route Frequency Ordered Stop   08/10/24 1000  cefTRIAXone  (ROCEPHIN ) 1 g in sodium chloride  0.9 % 100 mL IVPB        1 g 200 mL/hr over 30 Minutes Intravenous Every 24 hours 08/09/24 1928     08/08/24 1800  vancomycin  (VANCOREADY) IVPB 500 mg/100 mL  Status:  Discontinued        500 mg 100 mL/hr over 60 Minutes Intravenous Every 12 hours 08/08/24 0758 08/09/24 1918   08/08/24 1330  ceFEPIme  (MAXIPIME ) 2 g in sodium chloride  0.9 % 100 mL IVPB  Status:  Discontinued        2 g 200 mL/hr over 30 Minutes Intravenous Every 12 hours 08/08/24 0758 08/09/24 1918   08/07/24 1800  vancomycin  (VANCOCIN ) IVPB 1000 mg/200 mL premix  Status:  Discontinued        1,000 mg 200 mL/hr over 60 Minutes Intravenous Every 24 hours 08/06/24 2235 08/07/24 0832   08/07/24 1800  vancomycin  (VANCOREADY) IVPB 1250 mg/250 mL  Status:  Discontinued        1,250 mg 166.7 mL/hr over 90 Minutes Intravenous Every 24 hours 08/07/24 0832 08/08/24 0758   08/07/24 0930  ceFEPIme  (MAXIPIME ) 2 g in sodium chloride  0.9 % 100 mL IVPB  Status:  Discontinued        2 g 200 mL/hr over 30 Minutes Intravenous Every 8 hours 08/07/24 0830 08/08/24 0758   08/07/24 0000  ceFEPIme  (MAXIPIME ) 2 g in sodium chloride  0.9 % 100 mL IVPB  Status:  Discontinued        2 g 200 mL/hr over 30 Minutes Intravenous Every 12 hours 08/06/24 2235 08/07/24 0830   08/06/24 1800  vancomycin  (VANCOREADY) IVPB 1250 mg/250 mL        1,250 mg 166.7 mL/hr over 90 Minutes Intravenous  Once 08/06/24 1755 08/06/24 2003        DVT prophylaxis: Lovenox   Code Status: DNR  Family Communication: No family at bedside   CONSULTS    Subjective    patient seen and examined,  she is more alert today.     Objective    Physical Examination:  Appears in no acute distress S1-S2, regular Lungs clear to auscultation bilaterally Abdomen is soft, nontender,  no organomegaly  Status is: Inpatient:             Mary Swanson   [facility] If 7PM-7AM, please contact night-coverage at [email or web address], Office  [phone]   08/11/2024, 8:17 AM  LOS: 4 days

## 2024-08-11 NOTE — Plan of Care (Signed)
   Problem: Nutritional: Goal: Maintenance of adequate nutrition will improve Outcome: Progressing   Problem: Skin Integrity: Goal: Risk for impaired skin integrity will decrease Outcome: Progressing

## 2024-08-11 NOTE — Progress Notes (Signed)
 EEG complete - results pending

## 2024-08-12 ENCOUNTER — Inpatient Hospital Stay (HOSPITAL_COMMUNITY)

## 2024-08-12 DIAGNOSIS — F0280 Dementia in other diseases classified elsewhere without behavioral disturbance: Secondary | ICD-10-CM | POA: Diagnosis not present

## 2024-08-12 DIAGNOSIS — M7989 Other specified soft tissue disorders: Secondary | ICD-10-CM

## 2024-08-12 DIAGNOSIS — L03116 Cellulitis of left lower limb: Secondary | ICD-10-CM | POA: Diagnosis not present

## 2024-08-12 DIAGNOSIS — T361X5A Adverse effect of cephalosporins and other beta-lactam antibiotics, initial encounter: Secondary | ICD-10-CM

## 2024-08-12 DIAGNOSIS — E1165 Type 2 diabetes mellitus with hyperglycemia: Secondary | ICD-10-CM | POA: Diagnosis not present

## 2024-08-12 DIAGNOSIS — G9340 Encephalopathy, unspecified: Secondary | ICD-10-CM | POA: Diagnosis not present

## 2024-08-12 DIAGNOSIS — G309 Alzheimer's disease, unspecified: Secondary | ICD-10-CM | POA: Diagnosis not present

## 2024-08-12 LAB — GLUCOSE, CAPILLARY
Glucose-Capillary: 149 mg/dL — ABNORMAL HIGH (ref 70–99)
Glucose-Capillary: 311 mg/dL — ABNORMAL HIGH (ref 70–99)
Glucose-Capillary: 201 mg/dL — ABNORMAL HIGH (ref 70–99)
Glucose-Capillary: 264 mg/dL — ABNORMAL HIGH (ref 70–99)

## 2024-08-12 MED ORDER — GADOBUTROL 1 MMOL/ML IV SOLN
6.0000 mL | Freq: Once | INTRAVENOUS | Status: AC | PRN
  Administered 2024-08-12: 6 mL via INTRAVENOUS

## 2024-08-12 NOTE — Inpatient Diabetes Management (Signed)
 Inpatient Diabetes Program Recommendations  AACE/ADA: New Consensus Statement on Inpatient Glycemic Control   Target Ranges:  Prepandial:   less than 140 mg/dL      Peak postprandial:   less than 180 mg/dL (1-2 hours)      Critically ill patients:  140 - 180 mg/dL    Latest Reference Range & Units 08/11/24 00:01 08/11/24 04:09 08/11/24 11:31 08/11/24 16:14 08/11/24 20:00 08/12/24 06:22 08/12/24 11:31  Glucose-Capillary 70 - 99 mg/dL 821 (H) 856 (H) 664 (H) 233 (H) 105 (H) 149 (H) 311 (H)   Review of Glycemic Control  Diabetes history: DM2 Outpatient Diabetes medications: Lantus  20 units daily and Metformin  1,000mg  BID  Current orders for Inpatient glycemic control: Novolog  0-9 units daily.   Inpatient Diabetes Program Recommendations:  Insulin : Please consider adding Novolog  3 units TID with meals for meal coverage.   Thanks,  Lavanda Search, RN, MSN, [facility]  Inpatient Diabetes Coordinator  Pager [phone] (8a-5p)

## 2024-08-12 NOTE — Progress Notes (Signed)
 [facility]  PROGRESS NOTE  Mary Swanson FMW:[id number] DOB: 1956-09-03 DOA: 08/06/2024 PCP: Patient, No Pcp Per   Brief HPI:   68 y.o. female with medical history significant of  Dementia, DM2, HTN HLD     Presented with   left leg pain swellng  Patient coming in from [facility]  with left lower extremity cellulitis. Was treated outpatient with Keflex for the past 2 weeks has been having blisters around left ankle At baseline has severe dementia and nonverbal Left leg has been more swollen And blisters occurred over the past 2 days Keflex was finished yesterday but still seems to be not improving She has been using compression stockings did not seem to be helping recently been stopped Recent x-ray of the foot was unremarkable no fevers at facility she is able to walk unassisted but has been more sedentary lately Patient appears to have diabetes at baseline  Doppler done in ER showed no DVT  Pt was started on vanc    Assessment/Plan:   Alzheimer's dementia with behavioral disturbance (HCC) Monitor for any sundowning  continue home dose of Ativan   Myoclonic jerks - Developed myoclonic jerks on 08/10/2024 , unclear etiology - Neurology was consulted  - Cefepime  discontinued - EEG was concerning for cefepime  toxicity pattern -Repeat EEG consistent with diffuse encephalopathy. -Will order MRI brain with and without contrast -Neurology following  B12 deficiency - Started on B12 supplementation  Anemia Obtain anemia panel  Transfuse for Hg <7 , rapidly dropping or  if symptomatic     Cellulitis -Started on empiric antibiotics vancomycin  and cefepime  -X-ray of tibia/fibula on left showed no acute fracture or dislocation, diffuse subcutaneous soft tissue edema and blister/nodule along the distal medial calf -Wound care RN consulted -Vancomycin  and cefepime  discontinued on 08/10/2022, started on ceftriaxone  for 3 more doses -Recommend outpatient podiatry  referral for debridement of calluses   DM (diabetes mellitus), type 2 (HCC) Order sliding scale decrease Lantus  down to 10 units due to episode of hypoglycemia -CBG has been elevated - Continue sliding scale insulin  NovoLog , Lantus    Essential hypertension Continue Lasix ; restart Norvasc  given severe hypertension -Added hydralazine  as needed Started metoprolol  25 mg p.o. twice daily - Dose of metoprolol  changed to 12.5 mg p.o. twice daily due to bradycardia   Hyperlipidemia Continue Crestor  20 mg a day  LikelyChronic diastolic CHF (congestive heart failure) (HCC) Currently appears to be euvolemic avoid overaggressive fluid resuscitation          Medications     amLODipine   10 mg Oral Daily   vitamin B-12  1,000 mcg Oral Daily   enoxaparin  (LOVENOX ) injection  40 mg Subcutaneous Q24H   insulin  aspart  0-9 Units Subcutaneous TID WC   LORazepam   0.5 mg Oral BID   metoprolol  tartrate  12.5 mg Oral BID   rosuvastatin   20 mg Oral QHS   sodium chloride  flush  3 mL Intravenous Q12H     Data Reviewed:   CBG:  Recent Labs  Lab 08/11/24 1614 08/11/24 2000 08/12/24 0622 08/12/24 1131 08/12/24 1624  GLUCAP 233* 105* 149* 311* 201*    SpO2: 94 %    Vitals:   08/12/24 0323 08/12/24 0345 08/12/24 0732 08/12/24 1427  BP: (!) 184/143 (!) 169/76 (!) 164/73 (!) 162/68  Pulse: (!) 56 (!) 58 (!) 58 65  Resp: 16 18 16 15   Temp: 98 F (36.7 C) 98 F (36.7 C) (!) 97.4 F (36.3 C) 97.6 F (36.4 C)  TempSrc:  Oral Oral  SpO2: 100% 100% 98% 94%  Weight:      Height:          Data Reviewed:  Basic Metabolic Panel: Recent Labs  Lab 08/06/24 1254 08/06/24 2243 08/07/24 0516 08/08/24 0519 08/09/24 2026  NA 143  --  144 144 143  K 4.2  --  3.6 3.8 3.5  CL 106  --  104 108 106  CO2 26  --  30 26 23   GLUCOSE 117*  --  92 142* 171*  BUN 15  --  10 8 15   CREATININE 0.92  --  0.73 0.88 0.92  CALCIUM  9.8  --  9.2 9.1 9.3  MG  --  1.7 2.0  --  1.9  PHOS  --  3.1  3.4  --   --     CBC: Recent Labs  Lab 08/06/24 1350 08/07/24 0516 08/08/24 0519 08/09/24 2026  WBC 5.9 4.8 5.3 5.3  NEUTROABS 3.9  --   --  3.0  HGB 11.6* 9.8* 11.9* 12.0  HCT 37.0 30.9* 37.1 38.4  MCV 98.9 97.8 97.1 97.0  PLT 346 316 236 393    LFT Recent Labs  Lab 08/06/24 1254 08/07/24 0516 08/09/24 2026  AST 24 14* 14*  ALT 21 17 17   ALKPHOS 58 46 46  BILITOT 0.9 0.6 0.8  PROT 7.0 6.2* 7.0  ALBUMIN 3.0* 2.6* 3.0*     Antibiotics: Anti-infectives (From admission, onward)    Start     Dose/Rate Route Frequency Ordered Stop   08/10/24 1000  cefTRIAXone  (ROCEPHIN ) 1 g in sodium chloride  0.9 % 100 mL IVPB        1 g 200 mL/hr over 30 Minutes Intravenous Every 24 hours 08/09/24 1928 08/12/24 1050   08/08/24 1800  vancomycin  (VANCOREADY) IVPB 500 mg/100 mL  Status:  Discontinued        500 mg 100 mL/hr over 60 Minutes Intravenous Every 12 hours 08/08/24 0758 08/09/24 1918   08/08/24 1330  ceFEPIme  (MAXIPIME ) 2 g in sodium chloride  0.9 % 100 mL IVPB  Status:  Discontinued        2 g 200 mL/hr over 30 Minutes Intravenous Every 12 hours 08/08/24 0758 08/09/24 1918   08/07/24 1800  vancomycin  (VANCOCIN ) IVPB 1000 mg/200 mL premix  Status:  Discontinued        1,000 mg 200 mL/hr over 60 Minutes Intravenous Every 24 hours 08/06/24 2235 08/07/24 0832   08/07/24 1800  vancomycin  (VANCOREADY) IVPB 1250 mg/250 mL  Status:  Discontinued        1,250 mg 166.7 mL/hr over 90 Minutes Intravenous Every 24 hours 08/07/24 0832 08/08/24 0758   08/07/24 0930  ceFEPIme  (MAXIPIME ) 2 g in sodium chloride  0.9 % 100 mL IVPB  Status:  Discontinued        2 g 200 mL/hr over 30 Minutes Intravenous Every 8 hours 08/07/24 0830 08/08/24 0758   08/07/24 0000  ceFEPIme  (MAXIPIME ) 2 g in sodium chloride  0.9 % 100 mL IVPB  Status:  Discontinued        2 g 200 mL/hr over 30 Minutes Intravenous Every 12 hours 08/06/24 2235 08/07/24 0830   08/06/24 1800  vancomycin  (VANCOREADY) IVPB 1250 mg/250 mL         1,250 mg 166.7 mL/hr over 90 Minutes Intravenous  Once 08/06/24 1755 08/06/24 2003        DVT prophylaxis: Lovenox   Code Status: DNR  Family Communication: No family at bedside  CONSULTS    Subjective   Patient seen, myoclonic jerks seems to have resolved after stopping cefepime .   Objective    Physical Examination:  Appears in no acute distress S1-S2, regular Lungs clear to auscultation bilaterally Alert, nonverbal, at baseline, moving all extremities  Status is: Inpatient:             Mary Swanson   [facility] If 7PM-7AM, please contact night-coverage at [email or web address], Office  [phone]   08/12/2024, 4:35 PM  LOS: 5 days

## 2024-08-12 NOTE — Progress Notes (Signed)
 NEUROLOGY CONSULT FOLLOW UP NOTE   Date of service: August 12, 2024 Patient Name: Alie Moudy MRN:  [id number] DOB:  [birth year]  Interval Hx/subjective   Millie at bedside feeding the patient. She is answering yes and no to some questions and comfortably eating the food.  Remains nonverbal which the family says is her baseline for many years  Vitals   Vitals:   08/11/24 1958 08/12/24 0323 08/12/24 0345 08/12/24 0732  BP: (!) 175/70 (!) 184/143 (!) 169/76 (!) 164/73  Pulse: 71 (!) 56 (!) 58 (!) 58  Resp: 16 16 18 16   Temp: 98.2 F (36.8 C) 98 F (36.7 C) 98 F (36.7 C) (!) 97.4 F (36.3 C)  TempSrc:    Oral  SpO2: 98% 100% 100% 98%  Weight:      Height:         Body mass index is 21.79 kg/m.  Physical Exam   Constitutional: chronically ill frail elderly female.  Eyes: No scleral injection.  HENT: No OP obstrucion.  Head: Normocephalic.  Cardiovascular: Normal rate and regular rhythm.  Respiratory: Effort normal, non-labored breathing.  GI: Soft.  No distension. There is no tenderness.  Skin: WDI.   Neurologic Examination   Awake alert sitting in bed, comfortably eating the food that is being fed by family Tracks examiner Nonverbal Follows simple commands Cranial nerves II to XII intact Able to move all 4 extremities to command although does not keep legs antigravity more than a second or so.  Medications  Current Facility-Administered Medications:    acetaminophen  (TYLENOL ) tablet 650 mg, 650 mg, Oral, Q6H PRN, 650 mg at 08/07/24 1652 **OR** acetaminophen  (TYLENOL ) suppository 650 mg, 650 mg, Rectal, Q6H PRN, Doutova, Anastassia, MD   amLODipine  (NORVASC ) tablet 10 mg, 10 mg, Oral, Daily, Doutova, Anastassia, MD, 10 mg at 08/12/24 1022   cyanocobalamin  (VITAMIN B12) tablet 1,000 mcg, 1,000 mcg, Oral, Daily, Drusilla, Gagan S, MD, 1,000 mcg at 08/12/24 1022   enoxaparin  (LOVENOX ) injection 40 mg, 40 mg, Subcutaneous, Q24H, Doutova, Anastassia, MD, 40 mg at  08/12/24 1022   hydrALAZINE  (APRESOLINE ) tablet 25 mg, 25 mg, Oral, Q6H PRN, Drusilla Sabas RAMAN, MD, 25 mg at 08/09/24 1857   HYDROcodone -acetaminophen  (NORCO/VICODIN) 5-325 MG per tablet 1-2 tablet, 1-2 tablet, Oral, Q4H PRN, Doutova, Anastassia, MD, 1 tablet at 08/11/24 1017   insulin  aspart (novoLOG ) injection 0-9 Units, 0-9 Units, Subcutaneous, TID WC, Drusilla, Sabas RAMAN, MD, 1 Units at 08/12/24 1025   LORazepam  (ATIVAN ) tablet 0.5 mg, 0.5 mg, Oral, BID, Drusilla, Gagan S, MD, 0.5 mg at 08/11/24 2108   metoprolol  tartrate (LOPRESSOR ) tablet 12.5 mg, 12.5 mg, Oral, BID, Drusilla, Gagan S, MD, 12.5 mg at 08/12/24 1022   ondansetron  (ZOFRAN ) tablet 4 mg, 4 mg, Oral, Q6H PRN **OR** ondansetron  (ZOFRAN ) injection 4 mg, 4 mg, Intravenous, Q6H PRN, Doutova, Anastassia, MD   rosuvastatin  (CRESTOR ) tablet 20 mg, 20 mg, Oral, QHS, Doutova, Anastassia, MD, 20 mg at 08/11/24 2107   sodium chloride  flush (NS) 0.9 % injection 3 mL, 3 mL, Intravenous, Q12H, Doutova, Anastassia, MD, 3 mL at 08/12/24 1023   sodium chloride  flush (NS) 0.9 % injection 3 mL, 3 mL, Intravenous, PRN, Doutova, Anastassia, MD  Labs and Diagnostic Imaging   CBC:  Recent Labs  Lab 08/06/24 1350 08/07/24 0516 08/08/24 0519 08/09/24 2026  WBC 5.9   < > 5.3 5.3  NEUTROABS 3.9  --   --  3.0  HGB 11.6*   < > 11.9* 12.0  HCT 37.0   < >  37.1 38.4  MCV 98.9   < > 97.1 97.0  PLT 346   < > 236 393   < > = values in this interval not displayed.    Basic Metabolic Panel:  Lab Results  Component Value Date   NA 143 08/09/2024   K 3.5 08/09/2024   CO2 23 08/09/2024   GLUCOSE 171 (H) 08/09/2024   BUN 15 08/09/2024   CREATININE 0.92 08/09/2024   CALCIUM  9.3 08/09/2024   GFRNONAA >60 08/09/2024   GFRAA >60 01/01/2020   Lipid Panel:  Lab Results  Component Value Date   LDLCALC 80 10/01/2019   HgbA1c:  Lab Results  Component Value Date   HGBA1C 5.7 (H) 08/06/2024   Urine Drug Screen:     Component Value Date/Time   LABOPIA NONE DETECTED  12/27/2019 1635   COCAINSCRNUR NONE DETECTED 12/27/2019 1635   LABBENZ NONE DETECTED 12/27/2019 1635   AMPHETMU NONE DETECTED 12/27/2019 1635   THCU NONE DETECTED 12/27/2019 1635   LABBARB NONE DETECTED 12/27/2019 1635    Alcohol Level     Component Value Date/Time   ETH <10 12/27/2019 1720   INR  Lab Results  Component Value Date   INR 1.2 01/06/2022   APTT  Lab Results  Component Value Date   APTT 30 01/06/2022    rEEG performed on 08/10/2024, read on 9/15:  abnormal due to severe diffuse slowing indicative of global cerebral dysfunction. GPDs at 1 Hz are on the ictal-interictal spectrum with mild-to-moderate potential for seizures. This pattern may be secondary to residual effects from cefepime    Labs  Ammonia 39 RPR nonreactive  B12 282    Routine EEG suggestive of diffuse encephalopathy.  Intermittent jerking noted during the study with no concomitant EEG change  Assessment   Tanza Outen is a 68 y.o. female  PMHx of ACE-inhibitor cough, past episode of acute metabolic encephalopathy, AKI, DM, hypernatremia, HTN, right foot cellulitis, Alzheimer's dementia and transaminitis who presented to the hospital from her SNF with LLE cellulitis. She was treated outpatient with Keflex for the past 2 weeks and has been having blisters around left ankle. Keflex was finished on Friday, but she still did not seem to be improved.  Neurology was consulted for jerking movements that she started to exhibit. At baseline she has severe dementia and is nonverbal. On examination today she is more awake, and more towards her baseline per family.  Routine EEG was concerning for cefepime  toxicity and repeat EEG seems to be consistent only with diffuse encephalopathy.  No EEG correlate seen on the jerky movements captured on the EEG Medical hospitalist and I discussed the case in an MRI was question.  I am okay getting an MRI to make sure were not missing a stroke or any other structural  lesion  Impression: Possible cefepime  toxicity causing encephalopathy and myoclonic jerking in a patient with poor brain reserve from dementia  Recommendations  Recommend B12 supplementation  Continue to avoid using cefepime  MRI brain pending-please call back if concerning findings Plan was relayed to Dr. Drusilla   -- Eligio Lav, MD Neurologist [facility] Pager: [phone]

## 2024-08-12 NOTE — Plan of Care (Signed)
  Problem: Fluid Volume: Goal: Ability to maintain a balanced intake and output will improve Outcome: Progressing   Problem: Skin Integrity: Goal: Risk for impaired skin integrity will decrease Outcome: Progressing   Problem: Clinical Measurements: Goal: Ability to maintain clinical measurements within normal limits will improve Outcome: Progressing   Problem: Elimination: Goal: Will not experience complications related to bowel motility Outcome: Progressing Goal: Will not experience complications related to urinary retention Outcome: Progressing   Problem: Pain Managment: Goal: General experience of comfort will improve and/or be controlled Outcome: Progressing   Problem: Safety: Goal: Ability to remain free from injury will improve Outcome: Progressing

## 2024-08-13 LAB — GLUCOSE, CAPILLARY
Glucose-Capillary: 183 mg/dL — ABNORMAL HIGH (ref 70–99)
Glucose-Capillary: 153 mg/dL — ABNORMAL HIGH (ref 70–99)

## 2024-08-13 MED ORDER — CYANOCOBALAMIN 1000 MCG PO TABS: 1000.0000 ug | ORAL_TABLET | Freq: Every day | ORAL | Status: AC

## 2024-08-13 MED ORDER — POLYETHYLENE GLYCOL 3350 17 G PO PACK
17.0000 g | PACK | Freq: Every day | ORAL | Status: DC
Start: 2024-08-13 — End: 2024-08-13
  Administered 2024-08-13: 17 g via ORAL
  Filled 2024-08-13: qty 1

## 2024-08-13 MED ORDER — METOPROLOL TARTRATE 25 MG PO TABS: 12.5000 mg | ORAL_TABLET | Freq: Two times a day (BID) | ORAL | Status: AC

## 2024-08-13 NOTE — Evaluation (Signed)
 Physical Therapy Evaluation Patient Details Name: Mary Swanson MRN: [id number] DOB: March 05, 1956 Today's Date: 08/13/2024  History of Present Illness  Pt is a 68 y/o F admitted on 08/06/24 after presenting with c/o LLE pain & swelling. Pt is being treated for LLE cellulitis. PMH: dementia, nonverbal, DM2, HTN, HLD  Clinical Impression  Pt seen for PT evaluation with pt received in bed, no family/caregivers present. Pt with hx of dementia at baseline, does attempt to verbalize but verbalizations are nonsensical majority of the time. Pt requires multimodal cuing for mobility, is able to ambulate in hallway with HHA & mod<>max assist 2/2 decreased balance & impaired gait pattern. Recommend ongoing frequent mobilizations while in acute setting to help facilitate return to PLOF.        If plan is discharge home, recommend the following: A lot of help with walking and/or transfers;A lot of help with bathing/dressing/bathroom;Assistance with feeding   Can travel by private vehicle        Equipment Recommendations Wheelchair cushion (measurements PT);Wheelchair (measurements PT)  Recommendations for Other Services       Functional Status Assessment Patient has had a recent decline in their functional status and demonstrates the ability to make significant improvements in function in a reasonable and predictable amount of time.     Precautions / Restrictions Precautions Precautions: Fall Restrictions Weight Bearing Restrictions Per Provider Order: No      Mobility  Bed Mobility Overal bed mobility: Needs Assistance Bed Mobility: Supine to Sit, Sit to Supine     Supine to sit: Min assist, HOB elevated, Used rails (exit L side of bed) Sit to supine: Mod assist   General bed mobility comments: total assist to scoot to [facility]    Transfers Overall transfer level: Needs assistance Equipment used: 1 person hand held assist Transfers: Sit to/from Stand Sit to Stand: Max assist            General transfer comment: 2/2 posterior lean    Ambulation/Gait Ambulation/Gait assistance: Max assist, Mod assist Gait Distance (Feet): 50 Feet Assistive device: 1 person hand held assist Gait Pattern/deviations: Decreased step length - right, Decreased step length - left, Decreased dorsiflexion - right, Decreased dorsiflexion - left, Decreased stride length, Narrow base of support, Decreased weight shift to left, Decreased weight shift to right Gait velocity: decreased     General Gait Details: posterior bias throughout gait  Stairs            Wheelchair Mobility     Tilt Bed    Modified Rankin (Stroke Patients Only)       Balance Overall balance assessment: Needs assistance Sitting-balance support: Feet supported, Bilateral upper extremity supported Sitting balance-Leahy Scale: Fair Sitting balance - Comments: supervision static sitting   Standing balance support: Single extremity supported, During functional activity Standing balance-Leahy Scale: Zero                               Pertinent Vitals/Pain Pain Assessment Pain Assessment: PAINAD Breathing: normal Negative Vocalization: none Facial Expression: smiling or inexpressive Body Language: relaxed Consolability: no need to console PAINAD Score: 0    Home Living Family/patient expects to be discharged to:: Other (Comment)                   Additional Comments: memory care    Prior Function Prior Level of Function : Patient poor historian/Family not available  Mobility Comments: per chart, pt typically walks touching wall or furniture intermittently       Extremity/Trunk Assessment   Upper Extremity Assessment Upper Extremity Assessment: Generalized weakness;Difficult to assess due to impaired cognition    Lower Extremity Assessment Lower Extremity Assessment: Generalized weakness;Difficult to assess due to impaired cognition       Communication    Communication Communication: Impaired Factors Affecting Communication: Difficulty expressing self (per chart, pt is nonverbal but pt attempts to verbalize during session, although verbalizations are nonsensical majority of the time)    Cognition Arousal: Alert Behavior During Therapy: WFL for tasks assessed/performed   PT - Cognitive impairments: History of cognitive impairments                       PT - Cognition Comments: hx of dementia at baseline Following commands: Impaired Following commands impaired: Follows one step commands with increased time, Follows one step commands inconsistently     Cueing Cueing Techniques: Verbal cues, Gestural cues, Visual cues, Tactile cues     General Comments      Exercises     Assessment/Plan    PT Assessment Patient needs continued PT services  PT Problem List Decreased strength;Pain;Decreased coordination;Decreased range of motion;Decreased activity tolerance;Decreased balance;Decreased mobility;Decreased safety awareness;Decreased knowledge of precautions;Decreased knowledge of use of DME       PT Treatment Interventions DME instruction;Balance training;Gait training;Neuromuscular re-education;Functional mobility training;Therapeutic activities;Therapeutic exercise;Patient/family education    PT Goals (Current goals can be found in the Care Plan section)  Acute Rehab PT Goals PT Goal Formulation: Patient unable to participate in goal setting Time For Goal Achievement: 08/27/24 Potential to Achieve Goals: Fair    Frequency Min 2X/week     Co-evaluation               AM-PAC PT 6 Clicks Mobility  Outcome Measure Help needed turning from your back to your side while in a flat bed without using bedrails?: A Little Help needed moving from lying on your back to sitting on the side of a flat bed without using bedrails?: A Lot Help needed moving to and from a bed to a chair (including a wheelchair)?: A Lot Help  needed standing up from a chair using your arms (e.g., wheelchair or bedside chair)?: Total Help needed to walk in hospital room?: Total Help needed climbing 3-5 steps with a railing? : Total 6 Click Score: 10    End of Session   Activity Tolerance: Patient tolerated treatment well Patient left: in bed;with call bell/phone within reach;with bed alarm set (fall mat on floor) Nurse Communication: Mobility status PT Visit Diagnosis: Unsteadiness on feet (R26.81);Difficulty in walking, not elsewhere classified (R26.2);Muscle weakness (generalized) (M62.81)    Time: 9083-9068 PT Time Calculation (min) (ACUTE ONLY): 15 min   Charges:   PT Evaluation $PT Eval Low Complexity: 1 Low   PT General Charges $$ ACUTE PT VISIT: 1 Visit         Richerd Pinal, PT, DPT 08/13/24, 9:39 AM   Richerd CHRISTELLA Pinal 08/13/2024, 9:38 AM

## 2024-08-13 NOTE — [place] FL2 (Signed)
 [place]  MEDICAID FL2 LEVEL OF CARE FORM     IDENTIFICATION  Patient Name: Mary Swanson Birthdate: 09-25-1956 Sex: female Admission Date (Current Location): 08/06/2024  [facility] and [place] Number:  Producer, television/film/video and Address:  [facility] [place], [address] [place], [place] [address]      Provider Number: [id number]  Attending Physician Name and Address:  Jonel Lonni SQUIBB, *  Relative Name and Phone Number:  Cobb,Phyllis Neighbor   [phone]    Current Level of Care: Hospital Recommended Level of Care: Assisted Living Facility [facility] memory care) Prior Approval Number:    Date Approved/Denied:   PASRR Number:    Discharge Plan: Other (Comment) [facility] memory care)    Current Diagnoses: Patient Active Problem List   Diagnosis Date Noted   Cellulitis 08/06/2024   Chronic diastolic CHF (congestive heart failure) (HCC) 08/06/2024   Acute kidney injury (HCC) 01/31/2024   Palliative care encounter 01/09/2023   Type 2 diabetes mellitus with hyperglycemia, with long-term current use of insulin  (HCC) 06/23/2022   Uncontrolled type 2 diabetes mellitus with hyperglycemia, with long-term current use of insulin  with hyperlipidemia 01/07/2022   UTI (urinary tract infection) 06/16/2021   Alzheimer's dementia with behavioral disturbance (HCC)    Abnormal CXR    Essential hypertension    Dehydration 12/27/2019   Hyperglycemia 12/03/2019   Heart murmur, systolic 05/29/2017   DM (diabetes mellitus), type 2 (HCC) 01/24/2007   Hyperlipidemia 01/24/2007   Obesity (BMI 30-39.9) 01/24/2007   Anemia 01/24/2007   History of depression 01/24/2007   RHINITIS, ALLERGIC 01/24/2007    Orientation RESPIRATION BLADDER Height & Weight      (non verbal)  Normal Incontinent Weight: 135 lb (61.2 kg) Height:  5' 6 (167.6 cm)  BEHAVIORAL SYMPTOMS/MOOD NEUROLOGICAL BOWEL NUTRITION STATUS      Incontinent Diet (carb modified)  AMBULATORY  STATUS COMMUNICATION OF NEEDS Skin   Extensive Assist Does not communicate Other (Comment) (redness)                       Personal Care Assistance Level of Assistance              Functional Limitations Info  Speech     Speech Info: Impaired    SPECIAL CARE FACTORS FREQUENCY  PT (By licensed PT)     PT Frequency: evaluate and treat              Contractures Contractures Info: Not present    Additional Factors Info  Code Status, Allergies, Insulin  Sliding Scale Code Status Info: DNR Allergies Info: Erythromycin, Latex, Lisinopril    Insulin  Sliding Scale Info: Novolog : see discharge summary       Current Medications (08/13/2024):  This is the current hospital active medication list Current Facility-Administered Medications  Medication Dose Route Frequency Provider Last Rate Last Admin   acetaminophen  (TYLENOL ) tablet 650 mg  650 mg Oral Q6H PRN Doutova, Anastassia, MD   650 mg at 08/07/24 1652   Or   acetaminophen  (TYLENOL ) suppository 650 mg  650 mg Rectal Q6H PRN Doutova, Anastassia, MD       amLODipine  (NORVASC ) tablet 10 mg  10 mg Oral Daily Doutova, Anastassia, MD   10 mg at 08/13/24 0842   cyanocobalamin  (VITAMIN B12) tablet 1,000 mcg  1,000 mcg Oral Daily Drusilla Sabas RAMAN, MD   1,000 mcg at 08/13/24 0843   enoxaparin  (LOVENOX ) injection 40 mg  40 mg Subcutaneous Q24H Doutova, Anastassia,  MD   40 mg at 08/13/24 0843   hydrALAZINE  (APRESOLINE ) tablet 25 mg  25 mg Oral Q6H PRN Lama, Gagan S, MD   25 mg at 08/09/24 1857   HYDROcodone -acetaminophen  (NORCO/VICODIN) 5-325 MG per tablet 1-2 tablet  1-2 tablet Oral Q4H PRN Doutova, Anastassia, MD   1 tablet at 08/11/24 1017   insulin  aspart (novoLOG ) injection 0-9 Units  0-9 Units Subcutaneous TID WC Drusilla Sabas RAMAN, MD   2 Units at 08/13/24 9141   LORazepam  (ATIVAN ) tablet 0.5 mg  0.5 mg Oral BID Lama, Gagan S, MD   0.5 mg at 08/13/24 9156   metoprolol  tartrate (LOPRESSOR ) tablet 12.5 mg  12.5 mg Oral BID Drusilla Sabas RAMAN, MD   12.5 mg at 08/13/24 9157   ondansetron  (ZOFRAN ) tablet 4 mg  4 mg Oral Q6H PRN Doutova, Anastassia, MD       Or   ondansetron  (ZOFRAN ) injection 4 mg  4 mg Intravenous Q6H PRN Doutova, Anastassia, MD       polyethylene glycol (MIRALAX  / GLYCOLAX ) packet 17 g  17 g Oral Daily Danford, Lonni SQUIBB, MD       rosuvastatin  (CRESTOR ) tablet 20 mg  20 mg Oral QHS Doutova, Anastassia, MD   20 mg at 08/12/24 2202   sodium chloride  flush (NS) 0.9 % injection 3 mL  3 mL Intravenous Q12H Doutova, Anastassia, MD   3 mL at 08/13/24 0849   sodium chloride  flush (NS) 0.9 % injection 3 mL  3 mL Intravenous PRN Doutova, Anastassia, MD         Discharge Medications: Please see discharge summary for a list of discharge medications.  Relevant Imaging Results:  Relevant Lab Results:   Additional Information SSN:[id number]  Bridget Cordella Simmonds, LCSW

## 2024-08-13 NOTE — Progress Notes (Addendum)
 DISCHARGE NOTE SNF Mary Swanson to be discharged Skilled nursing facility back to memory care per MD order. Patient verbalized understanding.  Skin clean, dry and intact without evidence of skin break down, no evidence of skin tears noted. IV catheter discontinued intact. Site without signs and symptoms of complications. Dressing and pressure applied. Pt denies pain at the site currently. No complaints noted.  See Lda for wounds at discharge.  Patient none verbal and confused at baseline. Discharge packet assembled. An After Visit Summary (AVS) was printed and given to the EMS personnel. Patient escorted via stretcher and discharged to [facility] via ambulance. Report called to accepting facility; bu floor RN all questions and concerns addressed  Peyton SHAUNNA Pepper, RN

## 2024-08-13 NOTE — TOC Transition Note (Signed)
 Transition of Care [facility]) - Discharge Note   Patient Details  Name: Mary Swanson MRN: [id number] Date of Birth: [birth year]  Transition of [facility]) CM/SW Contact:  Bridget Cordella Simmonds, LCSW Phone Number: 08/13/2024, 1:45 PM   Clinical Narrative:   Pt discharging to [facility] memory care. RN call report to [phone].  PTAR called [phone].     Final next level of care: Assisted Living Barriers to Discharge: Barriers Resolved   Patient Goals and CMS Choice            Discharge Placement              Patient chooses bed at:  [facility] memory care) Patient to be transferred to facility by: ptar Name of family member notified: Tilton Rouleau, legal guardian Patient and family notified of of transfer: 08/13/24  Discharge Plan and Services Additional resources added to the After Visit Summary for   In-house Referral: Clinical Social Work                                   Social Drivers of Health (SDOH) Interventions SDOH Screenings   Food Insecurity: Patient Declined (08/07/2024)  Housing: Unknown (08/07/2024)  Transportation Needs: Patient Unable To Answer (08/07/2024)  Utilities: Patient Unable To Answer (08/07/2024)  Depression (PHQ2-9): Low Risk  (10/01/2019)  Social Connections: Patient Unable To Answer (08/07/2024)  Tobacco Use: Low Risk  (08/06/2024)     Readmission Risk Interventions    01/10/2022   12:44 PM  Readmission Risk Prevention Plan  Transportation Screening Complete  PCP or Specialist Appt within 3-5 Days Complete  HRI or Home Care Consult Complete  Social Work Consult for Recovery Care Planning/Counseling Complete  Palliative Care Screening Complete  Medication Review Oceanographer) Complete

## 2024-08-13 NOTE — Discharge Summary (Signed)
 Physician Discharge Summary   Patient: Mary Swanson MRN: [id number] DOB: [birth year]  Admit date:     08/06/2024  Discharge date: 08/13/24  Discharge Physician: Lonni SHAUNNA Dalton   PCP: Patient, No Pcp Per     Recommendations at discharge:  Follow up with medical provider at [facility] within 1 week Obtain CBC and BMP in 1 week (discharge Cr 0.92, K 3.5, Na 143, Hgb 12.0)     Discharge Diagnoses: Principal Problem:   Cellulitis Active Problems:   Acute toxic encephalopathy due to cefepime    DM (diabetes mellitus), type 2 (HCC)   Hyperlipidemia   Anemia   Essential hypertension   Alzheimer's dementia with behavioral disturbance (HCC)   Type 2 diabetes mellitus with hyperglycemia, with long-term current use of insulin  (HCC)   Chronic diastolic CHF (congestive heart failure) [facility])      Hospital Course: 68 y.o. female  PMHx of ACE-inhibitor cough, past episode of acute metabolic encephalopathy, AKI, DM, hypernatremia, HTN, right foot cellulitis, Alzheimer's dementia and transaminitis who presented to the hospital from her SNF with LLE cellulitis. She was treated outpatient with Keflex for the past 2 weeks and has been having blisters around left ankle.  Admitted and started on more broad spectrum antibiotics.      Cellulitis Admitted on broad spectrum antibiotics.  Cellulitis resolved.   Acute toxic encephalopathy from cefepime  Noted to have intermittent myoclonic jerking.  Underwent MRI brain that was motion degraded but showed no mass or acute stroke.  Ammonia, RPR, B12 all unremarkable. Underwent routine EEG that showed no epileptiform correlates of jerking.  Cefepime  was stopped and symptoms improved.          The [place]  Controlled Substances Registry was reviewed for this patient prior to discharge.   Consultants: Neurology    Disposition: Long term care facility Diet recommendation:  Discharge Diet Orders (From admission, onward)     Start      Ordered   08/13/24 0000  Diet - low sodium heart healthy        08/13/24 1101             DISCHARGE MEDICATION: Allergies as of 08/13/2024       Reactions   Erythromycin Diarrhea   Latex Hives   Lisinopril  Cough        Medication List     PAUSE taking these medications    furosemide  20 MG tablet Wait to take this until your doctor or other care provider tells you to start again. Commonly known as: LASIX  Take 20 mg by mouth daily.       STOP taking these medications    cephALEXin 250 MG capsule Commonly known as: KEFLEX       TAKE these medications    acetaminophen  325 MG tablet Commonly known as: TYLENOL  Take 650 mg by mouth every 8 (eight) hours as needed for moderate pain, fever or headache.   amLODipine  10 MG tablet Commonly known as: NORVASC  Take 1 tablet (10 mg total) by mouth daily.   cyanocobalamin  1000 MCG tablet Take 1 tablet (1,000 mcg total) by mouth daily. Start taking on: August 14, 2024   feeding supplement (GLUCERNA SHAKE) Liqd Take 237 mLs by mouth 2 (two) times daily between meals. Between meals   ferrous sulfate  220 (44 Fe) MG/5ML solution Take 7.5 mLs by mouth daily.   Lantus  SoloStar 100 UNIT/ML Solostar Pen Generic drug: insulin  glargine Inject 18 Units into the skin daily.   Lokelma  5 g packet Generic  drug: sodium zirconium cyclosilicate  Take 1 packet by mouth daily.   LORazepam  0.5 MG tablet Commonly known as: ATIVAN  Take 1 tablet (0.5 mg total) by mouth in the morning and at bedtime.   metFORMIN  1000 MG tablet Commonly known as: GLUCOPHAGE  Take 1,000 mg by mouth 2 (two) times daily.   metoprolol  tartrate 25 MG tablet Commonly known as: LOPRESSOR  Take 0.5 tablets (12.5 mg total) by mouth 2 (two) times daily.   multivitamin with minerals Tabs tablet Take 1 tablet by mouth daily.   rosuvastatin  20 MG tablet Commonly known as: CRESTOR  Take 20 mg by mouth at bedtime.         Discharge Instructions      Diet - low sodium heart healthy   Complete by: As directed    Increase activity slowly   Complete by: As directed    No wound care   Complete by: As directed        Discharge Exam: Filed Weights   08/06/24 1206  Weight: 61.2 kg    General: Pt is alert, awake, not in acute distress, lying in bed, mumbling incoherently Cardiovascular: RRR, nl S1-S2, no murmurs appreciated.   No LE edema.   Respiratory: Normal respiratory rate and rhythm.  CTAB without rales or wheezes. Abdominal: Abdomen soft and non-tender.  No distension or HSM.   Skin: No remainders of cellulitis of leg apparent Neuro/Psych: Strength symmetric in upper and lower extremities but generally weak, hard to follow commands given dementia.  Judgment and insight appear imapired but at baseline.   Condition at discharge: fair  The results of significant diagnostics from this hospitalization (including imaging, microbiology, ancillary and laboratory) are listed below for reference.   Imaging Studies: MR BRAIN W WO CONTRAST Result Date: 08/12/2024 CLINICAL DATA:  Seizure, new-onset, no history of trauma. EXAM: MRI HEAD WITHOUT AND WITH CONTRAST TECHNIQUE: Multiplanar, multiecho pulse sequences of the brain and surrounding structures were obtained without and with intravenous contrast. CONTRAST:  6mL GADAVIST  GADOBUTROL  1 MMOL/ML IV SOLN COMPARISON:  Head CT 01/31/2024 and MRI 01/07/2022 FINDINGS: The study is motion degraded throughout with some sequences being moderately to severely degraded. Following the administration of IV contrast, the patient reportedly became more aggressive and the examination was terminated for reason of patient safety. No postcontrast imaging was obtained. Brain: There is no evidence of an acute infarct, intracranial hemorrhage, mass, midline shift, or extra-axial fluid collection. There is moderate global cerebral atrophy which has mildly progressed from the prior MRI, and asymmetrically  prominent volume loss is again noted in the right temporal lobe. Cerebral white matter T2 hyperintensities have also progressed and are nonspecific but compatible with mild chronic small vessel ischemic disease. Vascular: Major intracranial vascular flow voids are preserved. Skull and upper cervical spine: Unremarkable bone marrow signal. Sinuses/Orbits: Bilateral cataract extraction. Largely clear paranasal sinuses. Small left mastoid effusion. Other: None. IMPRESSION: 1. Motion degraded examination with no postcontrast imaging obtained. 2. No acute intracranial abnormality. 3. Cerebral atrophy and mild chronic small vessel ischemic disease. Electronically Signed   By: Dasie Hamburg M.D.   On: 08/12/2024 18:50   EEG adult Result Date: 08/11/2024 Shelton Arlin KIDD, MD     08/11/2024  4:30 PM Patient Name: Yanissa Michalsky MRN: [id number] Epilepsy Attending: Arlin KIDD Shelton Referring Physician/Provider: Voncile Isles, MD Date: 08/11/2024 Duration: 22.29 mins Patient history:  68 y.o. female with a history of altered mental status who is undergoing an EEG to evaluate for seizures. Level of alertness: Awake AEDs during  EEG study: Ativan  Technical aspects: This EEG study was done with scalp electrodes positioned according to the 10-20 International system of electrode placement. Electrical activity was reviewed with band pass filter of 1-70Hz , sensitivity of 7 uV/mm, display speed of 87mm/sec with a 60Hz  notched filter applied as appropriate. EEG data were recorded continuously and digitally stored.  Video monitoring was available and reviewed as appropriate. Description: EEG showed continuous generalized 3 to 6 Hz theta-delta slowing, a times with triphasic morphology Hyperventilation and photic stimulation were not performed.   Intermittently during the study, patient was  noted to have brief arm/shoulder jerking. Concomitant eeg before, during and after the event didn't show any eeg change to suggest seizure ABNORMALITY  - Continuous slow, generalized IMPRESSION: This study is suggestive of moderate diffuse encephalopathy. No seizures or epileptiform discharges were seen throughout the recording. Intermittently during the study, patient was  noted to have brief arm/shoulder jerking without concomitant eeg change. These events were NON epileptic EEG appears to have improved compared to yesterday Arlin MALVA Krebs   EEG adult Result Date: 08/11/2024 Matthews Elida HERO, MD     08/11/2024  7:24 AM Routine EEG Report Easter Kennebrew is a 68 y.o. female with a history of altered mental status who is undergoing an EEG to evaluate for seizures. Report: This EEG was acquired with electrodes placed according to the International 10-20 electrode system (including Fp1, Fp2, F3, F4, C3, C4, P3, P4, O1, O2, T3, T4, T5, T6, A1, A2, Fz, Cz, Pz). The following electrodes were missing or displaced: none. The occipital dominant rhythm was 3-4 Hz with overriding GPDs at 1 Hz. This activity is reactive to stimulation. Drowsiness was manifested by background fragmentation; deeper stages of sleep were not identified. There was no focal slowing. There were no electrographic seizures identified. There was no abnormal response to photic stimulation or hyperventilation. Impression and clinical correlation: This EEG was obtained while awake and drowsy and is abnormal due to severe diffuse slowing indicative of global cerebral dysfunction. GPDs at 1 Hz are on the ictal-interictal spectrum with mild-to-moderate potential for seizures. This pattern may be secondary to residual effects from cefepime . Recommend repeat routine EEG tomorrow. Epileptiform abnormalities were not seen during this recording. Elida Matthews, MD [facility] [phone] If 7pm- 7am, please page neurology on call as listed in AMION.   DG Tibia/Fibula Left Port Result Date: 08/06/2024 EXAM: 3 VIEW(S) XRAY OF THE LEFT TIBIA AND FIBULA 08/06/2024 08:09:00 PM COMPARISON: None  available. CLINICAL HISTORY: Per triage: PT BIB [facility] from [facility] after family noted swelling to L leg lasting 2 weeks. CC being blisters around L ankle and inner R elbow. PT aox0 at baseline, Hx of dementia and non verbal. EMS state recently had run of antibiotics ; in past week. Family en route. FINDINGS: BONES AND JOINTS: No acute fracture. No focal osseous lesion. No joint dislocation. SOFT TISSUES: Diffuse subcutaneous soft tissue edema. Vascular calcifications. Blister or nodule along the distal medial calf . IMPRESSION: 1. No acute fracture or dislocation. 2. Diffuse subcutaneous soft tissue edema and blister/nodule along the distal medial calf. Electronically signed by: Norman Gatlin MD 08/06/2024 08:31 PM EDT RP Workstation: [id number]   VAS US  LOWER EXTREMITY VENOUS (DVT) (7a-7p) Result Date: 08/06/2024  Lower Venous DVT Study Patient Name:  LUCILLIA CORSON  Date of Exam:   08/06/2024 Medical Rec #: [id number]     Accession #:    [id number] Date of Birth: 05-20-1956    Patient Gender: F Patient Age:  67 years Exam Location:  [facility] Procedure:      VAS US  LOWER EXTREMITY VENOUS (DVT) Referring Phys: [facility] SMOOT --------------------------------------------------------------------------------  Indications: Swelling.  Limitations: Patient unable to cooperate (dementia), unable to get pants off. Comparison Study: No previous exams Performing Technologist: Jody Hill RVT, RDMS  Examination Guidelines: A complete evaluation includes B-mode imaging, spectral Doppler, color Doppler, and power Doppler as needed of all accessible portions of each vessel. Bilateral testing is considered an integral part of a complete examination. Limited examinations for reoccurring indications may be performed as noted. The reflux portion of the exam is performed with the patient in reverse Trendelenburg.  +-----+---------------+---------+-----------+----------+--------------+  RIGHTCompressibilityPhasicitySpontaneityPropertiesThrombus Aging +-----+---------------+---------+-----------+----------+--------------+ CFV  Full           Yes      Yes                                 +-----+---------------+---------+-----------+----------+--------------+   +---------+---------------+---------+-----------+----------+------------------+ LEFT     CompressibilityPhasicitySpontaneityPropertiesThrombus Aging     +---------+---------------+---------+-----------+----------+------------------+ CFV      Full           Yes      Yes                                     +---------+---------------+---------+-----------+----------+------------------+ SFJ      Full                                                            +---------+---------------+---------+-----------+----------+------------------+ FV Prox  Full           Yes      Yes                                     +---------+---------------+---------+-----------+----------+------------------+ FV Mid                                                unable to                                                                visualized         +---------+---------------+---------+-----------+----------+------------------+ FV DistalFull           Yes      Yes                                     +---------+---------------+---------+-----------+----------+------------------+ PFV      Full           Yes      Yes                                     +---------+---------------+---------+-----------+----------+------------------+  POP      Full           Yes      Yes                                     +---------+---------------+---------+-----------+----------+------------------+ PTV                                                   Not well                                                                 visualized          +---------+---------------+---------+-----------+----------+------------------+ PERO     Full                                                            +---------+---------------+---------+-----------+----------+------------------+   Left Technical Findings: Not visualized segments include FV (mid) due to inability to take off pants.   Summary: RIGHT: - No evidence of common femoral vein obstruction.   LEFT: - There is no evidence of deep vein thrombosis in the lower extremity. However, portions of this examination were limited- see technologist comments above.  - No cystic structure found in the popliteal fossa. - Ultrasound characteristics of enlarged lymph nodes noted in the groin. subcutaneous edema on calf and ankle.  *See table(s) above for measurements and observations. Electronically signed by Lonni Gaskins MD on 08/06/2024 at 2:53:49 PM.    Final     Microbiology: Results for orders placed or performed during the hospital encounter of 01/31/24  Urine Culture     Status: Abnormal   Collection Time: 01/31/24  5:42 PM   Specimen: Urine, Random  Result Value Ref Range Status   Specimen Description URINE, RANDOM  Final   Special Requests   Final    NONE Reflexed from [id number] Performed at [facility], [address], [place], [place] [address]    Culture >=100,000 COLONIES/mL ESCHERICHIA COLI (A)  Final   Report Status 02/04/2024 FINAL  Final   Organism ID, Bacteria ESCHERICHIA COLI (A)  Final      Susceptibility   Escherichia coli - MIC*    AMPICILLIN 8 SENSITIVE Sensitive     CEFAZOLIN <=4 SENSITIVE Sensitive     CEFEPIME  <=0.12 SENSITIVE Sensitive     CEFTRIAXONE  <=0.25 SENSITIVE Sensitive     CIPROFLOXACIN <=0.25 SENSITIVE Sensitive     GENTAMICIN <=1 SENSITIVE Sensitive     IMIPENEM <=0.25 SENSITIVE Sensitive     NITROFURANTOIN  <=16 SENSITIVE Sensitive     TRIMETH/SULFA <=20 SENSITIVE Sensitive     AMPICILLIN/SULBACTAM <=2 SENSITIVE Sensitive     PIP/TAZO <=4  SENSITIVE Sensitive ug/mL    * >=100,000 COLONIES/mL ESCHERICHIA COLI  Resp panel by RT-PCR (RSV, Flu A&B, Covid) Anterior Nasal Swab     Status: None  Collection Time: 01/31/24  7:47 PM   Specimen: Anterior Nasal Swab  Result Value Ref Range Status   SARS Coronavirus 2 by RT PCR NEGATIVE NEGATIVE Final   Influenza A by PCR NEGATIVE NEGATIVE Final   Influenza B by PCR NEGATIVE NEGATIVE Final    Comment: (NOTE) The Xpert Xpress SARS-CoV-2/FLU/RSV plus assay is intended as an aid in the diagnosis of influenza from Nasopharyngeal swab specimens and should not be used as a sole basis for treatment. Nasal washings and aspirates are unacceptable for Xpert Xpress SARS-CoV-2/FLU/RSV testing.  Fact Sheet for Patients: BloggerCourse.com  Fact Sheet for Healthcare Providers: SeriousBroker.it  This test is not yet approved or cleared by the [place]  FDA and has been authorized for detection and/or diagnosis of SARS-CoV-2 by FDA under an Emergency Use Authorization (EUA). This EUA will remain in effect (meaning this test can be used) for the duration of the COVID-19 declaration under Section 564(b)(1) of the Act, 21 U.S.C. section 360bbb-3(b)(1), unless the authorization is terminated or revoked.     Resp Syncytial Virus by PCR NEGATIVE NEGATIVE Final    Comment: (NOTE) Fact Sheet for Patients: BloggerCourse.com  Fact Sheet for Healthcare Providers: SeriousBroker.it  This test is not yet approved or cleared by the [place]  FDA and has been authorized for detection and/or diagnosis of SARS-CoV-2 by FDA under an Emergency Use Authorization (EUA). This EUA will remain in effect (meaning this test can be used) for the duration of the COVID-19 declaration under Section 564(b)(1) of the Act, 21 U.S.C. section 360bbb-3(b)(1), unless the authorization is terminated  or revoked.  Performed at [facility], [address] [place], [place] [address]     Labs: CBC: Recent Labs  Lab 08/06/24 1350 08/07/24 0516 08/08/24 0519 08/09/24 2026  WBC 5.9 4.8 5.3 5.3  NEUTROABS 3.9  --   --  3.0  HGB 11.6* 9.8* 11.9* 12.0  HCT 37.0 30.9* 37.1 38.4  MCV 98.9 97.8 97.1 97.0  PLT 346 316 236 393   Basic Metabolic Panel: Recent Labs  Lab 08/06/24 1254 08/06/24 2243 08/07/24 0516 08/08/24 0519 08/09/24 2026  NA 143  --  144 144 143  K 4.2  --  3.6 3.8 3.5  CL 106  --  104 108 106  CO2 26  --  30 26 23   GLUCOSE 117*  --  92 142* 171*  BUN 15  --  10 8 15   CREATININE 0.92  --  0.73 0.88 0.92  CALCIUM  9.8  --  9.2 9.1 9.3  MG  --  1.7 2.0  --  1.9  PHOS  --  3.1 3.4  --   --    Liver Function Tests: Recent Labs  Lab 08/06/24 1254 08/07/24 0516 08/09/24 2026  AST 24 14* 14*  ALT 21 17 17   ALKPHOS 58 46 46  BILITOT 0.9 0.6 0.8  PROT 7.0 6.2* 7.0  ALBUMIN 3.0* 2.6* 3.0*   CBG: Recent Labs  Lab 08/12/24 0622 08/12/24 1131 08/12/24 1624 08/12/24 2122 08/13/24 0627  GLUCAP 149* 311* 201* 264* 183*    Discharge time spent: approximately 45 minutes spent on discharge counseling, evaluation of patient on day of discharge, and coordination of discharge planning with nursing, social work, pharmacy and case management  Signed: Lonni SHAUNNA Dalton, MD [facility] 08/13/2024

## 2024-08-13 NOTE — TOC Progression Note (Addendum)
 Transition of Care [facility]) - Progression Note    Patient Details  Name: Mary Swanson MRN: [id number] Date of Birth: 06/21/1956  Transition [facility]) CM/SW Contact  Bridget Cordella Simmonds, LCSW Phone Number: 08/13/2024, 10:42 AM  Clinical Narrative:   CSW spoke with [facility]  They can receive pt today, will need FL2/DC summary to [email or web address].  1130: Human resources officer to [facility].   1245: Message form [place]: she reviewed documents and pt mobility status, they can take pt and will have HH PT work with her there.   Expected Discharge Plan: Assisted Living [facility]) Barriers to Discharge: Continued Medical Work up               Expected Discharge Plan and Services In-house Referral: Clinical Social Work     Living arrangements for the past 2 months: Assisted Living Facility Expected Discharge Date: 08/13/24                                     Social Drivers of Health (SDOH) Interventions SDOH Screenings   Food Insecurity: Patient Declined (08/07/2024)  Housing: Unknown (08/07/2024)  Transportation Needs: Patient Unable To Answer (08/07/2024)  Utilities: Patient Unable To Answer (08/07/2024)  Depression (PHQ2-9): Low Risk  (10/01/2019)  Social Connections: Patient Unable To Answer (08/07/2024)  Tobacco Use: Low Risk  (08/06/2024)    Readmission Risk Interventions    01/10/2022   12:44 PM  Readmission Risk Prevention Plan  Transportation Screening Complete  PCP or Specialist Appt within 3-5 Days Complete  HRI or Home Care Consult Complete  Social Work Consult for Recovery Care Planning/Counseling Complete  Palliative Care Screening Complete  Medication Review Ocean[employer]) Complete

## 2024-08-17 ENCOUNTER — Emergency Department (HOSPITAL_COMMUNITY)

## 2024-08-17 ENCOUNTER — Emergency Department (HOSPITAL_COMMUNITY)
Admission: EM | Admit: 2024-08-17 | Discharge: 2024-08-17 | Disposition: A | Discharge status: Skilled Nursing Facility | Attending: Emergency Medicine | Admitting: Emergency Medicine

## 2024-08-17 DIAGNOSIS — S0993XA Unspecified injury of face, initial encounter: Secondary | ICD-10-CM | POA: Diagnosis present

## 2024-08-17 DIAGNOSIS — S0083XA Contusion of other part of head, initial encounter: Secondary | ICD-10-CM | POA: Diagnosis not present

## 2024-08-17 DIAGNOSIS — Z91040 Latex allergy status: Secondary | ICD-10-CM | POA: Insufficient documentation

## 2024-08-17 DIAGNOSIS — W19XXXA Unspecified fall, initial encounter: Secondary | ICD-10-CM | POA: Insufficient documentation

## 2024-08-17 DIAGNOSIS — Z794 Long term (current) use of insulin: Secondary | ICD-10-CM | POA: Diagnosis not present

## 2024-08-17 DIAGNOSIS — K130 Diseases of lips: Secondary | ICD-10-CM | POA: Diagnosis not present

## 2024-08-17 LAB — I-STAT CHEM 8, ED
BUN: 27 mg/dL — ABNORMAL HIGH (ref 8–23)
Calcium, Ion: 1.14 mmol/L — ABNORMAL LOW (ref 1.15–1.40)
Chloride: 105 mmol/L (ref 98–111)
Creatinine, Ser: 1.1 mg/dL — ABNORMAL HIGH (ref 0.44–1.00)
Glucose, Bld: 158 mg/dL — ABNORMAL HIGH (ref 70–99)
HCT: 42 % (ref 36.0–46.0)
Hemoglobin: 14.3 g/dL (ref 12.0–15.0)
Potassium: 4 mmol/L (ref 3.5–5.1)
Sodium: 145 mmol/L (ref 135–145)
TCO2: 30 mmol/L (ref 22–32)

## 2024-08-17 LAB — URINALYSIS, ROUTINE W REFLEX MICROSCOPIC
Bilirubin Urine: NEGATIVE
Glucose, UA: NEGATIVE mg/dL
Ketones, ur: NEGATIVE mg/dL
Leukocytes,Ua: NEGATIVE
Nitrite: NEGATIVE
Protein, ur: 100 mg/dL — AB
Specific Gravity, Urine: 1.014 (ref 1.005–1.030)
pH: 5 (ref 5.0–8.0)

## 2024-08-17 LAB — COMPREHENSIVE METABOLIC PANEL WITH GFR
ALT: 21 U/L (ref 0–44)
AST: 29 U/L (ref 15–41)
Albumin: 3.7 g/dL (ref 3.5–5.0)
Alkaline Phosphatase: 56 U/L (ref 38–126)
Anion gap: 15 (ref 5–15)
BUN: 21 mg/dL (ref 8–23)
CO2: 28 mmol/L (ref 22–32)
Calcium: 10.5 mg/dL — ABNORMAL HIGH (ref 8.9–10.3)
Chloride: 100 mmol/L (ref 98–111)
Creatinine, Ser: 1.11 mg/dL — ABNORMAL HIGH (ref 0.44–1.00)
GFR, Estimated: 54 mL/min — ABNORMAL LOW (ref >60)
Glucose, Bld: 151 mg/dL — ABNORMAL HIGH (ref 70–99)
Potassium: 3.9 mmol/L (ref 3.5–5.1)
Sodium: 143 mmol/L (ref 135–145)
Total Bilirubin: 1.3 mg/dL — ABNORMAL HIGH (ref 0.0–1.2)
Total Protein: 7.7 g/dL (ref 6.5–8.1)

## 2024-08-17 LAB — CBC
HCT: 44.1 % (ref 36.0–46.0)
Hemoglobin: 13.7 g/dL (ref 12.0–15.0)
MCH: 30.9 pg (ref 26.0–34.0)
MCHC: 31.1 g/dL (ref 30.0–36.0)
MCV: 99.3 fL (ref 80.0–100.0)
Platelets: 289 K/uL (ref 150–400)
RBC: 4.44 MIL/uL (ref 3.87–5.11)
RDW: 11.9 % (ref 11.5–15.5)
WBC: 5.8 K/uL (ref 4.0–10.5)
nRBC: 0 % (ref 0.0–0.2)

## 2024-08-17 NOTE — ED Provider Notes (Signed)
 [place] EMERGENCY DEPARTMENT AT [facility] Provider Note   CSN: [id number] Arrival date & time: 08/17/24  1426     Patient presents with: Mary Swanson is a 68 y.o. female.  Level 5 caveat secondary to altered mental status.  Patient here for unwitnessed fall.  Lip injury chipped tooth.  Patient unable to give any history.  Report from EMS was has been more altered over the last 2 or 3 days.  Unclear baseline.  Patient is nonverbal and is not participating in history or exam very much.  No family at bedside at this time  {Add pertinent medical, surgical, social history, OB history to YEP:67052} The history is provided by the EMS personnel.  Fall The problem has not changed since onset.      Prior to Admission medications   Medication Sig Start Date End Date Taking? Authorizing Provider  acetaminophen  (TYLENOL ) 325 MG tablet Take 650 mg by mouth every 8 (eight) hours as needed for moderate pain, fever or headache.    [provider]  amLODipine  (NORVASC ) 10 MG tablet Take 1 tablet (10 mg total) by mouth daily. Patient not taking: Reported on 08/06/2024 01/02/20   Sebastian Toribio GAILS, MD  cyanocobalamin  1000 MCG tablet Take 1 tablet (1,000 mcg total) by mouth daily. 08/14/24   Danford, Lonni SQUIBB, MD  feeding supplement, GLUCERNA SHAKE, (GLUCERNA SHAKE) LIQD Take 237 mLs by mouth 2 (two) times daily between meals. Between meals    [provider]  ferrous sulfate  220 (44 Fe) MG/5ML solution Take 7.5 mLs by mouth daily.    [provider]  furosemide  (LASIX ) 20 MG tablet Take 20 mg by mouth daily. 07/29/24   [provider]  insulin  glargine (LANTUS  SOLOSTAR) 100 UNIT/ML Solostar Pen Inject 18 Units into the skin daily. 06/18/21   Sebastian Toribio GAILS, MD  LOKELMA  5 g packet Take 1 packet by mouth daily. 07/30/24   [provider]  LORazepam  (ATIVAN ) 0.5 MG tablet Take 1 tablet (0.5 mg total) by mouth in the morning and at bedtime.  02/07/24   Amin, Ankit C, MD  metFORMIN  (GLUCOPHAGE ) 1000 MG tablet Take 1,000 mg by mouth 2 (two) times daily. 01/14/24   [provider]  metoprolol  tartrate (LOPRESSOR ) 25 MG tablet Take 0.5 tablets (12.5 mg total) by mouth 2 (two) times daily. 08/13/24   Danford, Lonni SQUIBB, MD  Multiple Vitamin (MULTIVITAMIN WITH MINERALS) TABS tablet Take 1 tablet by mouth daily. 01/02/20   Sebastian Toribio GAILS, MD  rosuvastatin  (CRESTOR ) 20 MG tablet Take 20 mg by mouth at bedtime.    [provider]    Allergies: Erythromycin, Latex, and Lisinopril     Review of Systems  Updated Vital Signs BP (!) 124/96 (BP Location: Right Arm)   Pulse 72   Temp (!) 97.3 F (36.3 C) (Axillary)   Resp 16   Ht 5' 6 (1.676 m)   Wt 61 kg   LMP  (LMP Unknown)   SpO2 100%   BMI 21.71 kg/m   Physical Exam Vitals and nursing note reviewed.  Constitutional:      General: She is not in acute distress.    Appearance: Normal appearance. She is well-developed.  HENT:     Head: Normocephalic.     Comments: Lower lip is a little swollen.  No active bleeding Eyes:     Conjunctiva/sclera: Conjunctivae normal.  Cardiovascular:     Rate and Rhythm: Normal rate and regular rhythm.  Heart sounds: No murmur heard. Pulmonary:     Effort: Pulmonary effort is normal. No respiratory distress.     Breath sounds: Normal breath sounds. No stridor. No wheezing.  Abdominal:     Palpations: Abdomen is soft.     Tenderness: There is no abdominal tenderness. There is no guarding or rebound.  Musculoskeletal:        General: No deformity.     Cervical back: Neck supple.  Skin:    General: Skin is warm and dry.  Neurological:     General: No focal deficit present.     Mental Status: She is alert.     GCS: GCS eye subscore is 4. GCS verbal subscore is 5. GCS motor subscore is 6.     Motor: No weakness.     Comments: She is awake.  She is not following commands.  She is increased tone of upper and lower  extremities and will resist moving.  No gross facial droop.  Not speaking     (all labs ordered are listed, but only abnormal results are displayed) Labs Reviewed  I-STAT CHEM 8, ED - Abnormal; Notable for the following components:      Result Value   BUN 27 (*)    Creatinine, Ser 1.10 (*)    Glucose, Bld 158 (*)    Calcium , Ion 1.14 (*)    All other components within normal limits  COMPREHENSIVE METABOLIC PANEL WITH GFR  CBC    EKG: EKG Interpretation Date/Time:  Sunday August 17 2024 14:45:01 EDT Ventricular Rate:  74 PR Interval:  148 QRS Duration:  77 QT Interval:  401 QTC Calculation: 445 R Axis:   32  Text Interpretation: sinus arrhythmia Consider left ventricular hypertrophy  No significant change since prior 3/25  Confirmed by Towana Sharper [phone]) on 08/17/2024 3:06:36 PM  Radiology: No results found.  {Document cardiac monitor, telemetry assessment procedure when appropriate:32947} Procedures   Medications Ordered in the ED - No data to display  Clinical Course as of 08/17/24 1656  Sun Aug 17, 2024  1534 Chest x-ray interpreted by me as no acute infiltrate.  Awaiting radiology reading. [MB]    Clinical Course User Index [MB] Towana Sharper BROCKS, MD   {Click here for ABCD2, HEART and other calculators REFRESH Note before signing:1}                              Medical Decision Making Amount and/or Complexity of Data Reviewed Labs: ordered. Radiology: ordered.   This patient complains of ***; this involves an extensive number of treatment Options and is a complaint that carries with it a high risk of complications and morbidity. The differential includes ***  I ordered, reviewed and interpreted labs, which included *** I ordered medication *** and reviewed PMP when indicated. I ordered imaging studies which included *** and I independently    visualized and interpreted imaging which showed *** Additional history obtained from *** Previous  records obtained and reviewed *** I consulted *** and discussed lab and imaging findings and discussed disposition.  Cardiac monitoring reviewed, *** Social determinants considered, *** Critical Interventions: ***  After the interventions stated above, I reevaluated the patient and found *** Admission and further testing considered, ***   {Document critical care time when appropriate  Document review of labs and clinical decision tools ie CHADS2VASC2, etc  Document your independent review of radiology images and any outside records  Document  your discussion with family members, caretakers and with consultants  Document social determinants of health affecting pt's care  Document your decision making why or why not admission, treatments were needed:32947:::1}   Final diagnoses:  None    ED Discharge Orders     None

## 2024-08-17 NOTE — ED Triage Notes (Signed)
 PT BIB [facility] for unwitnessed fall from [facility], EMS states lip and chip tooth were noted at facility. PT baseline is nonverbal and confused words, PT noted to have decrease in alertness x3 days prior to fall. Wheelchair at baseline.  C collar on at arrival.  EMS VS: BP 92/58, Sat 97% on RA, HR 60  RR14 CBG 182

## 2024-08-17 NOTE — ED Notes (Signed)
 Legal Guardian Mary Swanson [phone] would like an update asap

## 2024-10-06 ENCOUNTER — Emergency Department (HOSPITAL_COMMUNITY)

## 2024-10-06 ENCOUNTER — Inpatient Hospital Stay (HOSPITAL_COMMUNITY)
Admission: EM | Admit: 2024-10-06 | Discharge: 2024-10-08 | DRG: 690 | Disposition: A | Discharge status: Nursing Home (Includes ICF) | Source: Skilled Nursing Facility | Attending: Family Medicine | Admitting: Family Medicine

## 2024-10-06 DIAGNOSIS — Z9841 Cataract extraction status, right eye: Secondary | ICD-10-CM

## 2024-10-06 DIAGNOSIS — Z7984 Long term (current) use of oral hypoglycemic drugs: Secondary | ICD-10-CM

## 2024-10-06 DIAGNOSIS — E11621 Type 2 diabetes mellitus with foot ulcer: Secondary | ICD-10-CM | POA: Diagnosis present

## 2024-10-06 DIAGNOSIS — Z79899 Other long term (current) drug therapy: Secondary | ICD-10-CM

## 2024-10-06 DIAGNOSIS — I10 Essential (primary) hypertension: Secondary | ICD-10-CM | POA: Diagnosis present

## 2024-10-06 DIAGNOSIS — E872 Acidosis, unspecified: Secondary | ICD-10-CM | POA: Diagnosis present

## 2024-10-06 DIAGNOSIS — R509 Fever, unspecified: Secondary | ICD-10-CM | POA: Diagnosis present

## 2024-10-06 DIAGNOSIS — E11649 Type 2 diabetes mellitus with hypoglycemia without coma: Secondary | ICD-10-CM | POA: Diagnosis present

## 2024-10-06 DIAGNOSIS — Z888 Allergy status to other drugs, medicaments and biological substances status: Secondary | ICD-10-CM

## 2024-10-06 DIAGNOSIS — F0390 Unspecified dementia without behavioral disturbance: Secondary | ICD-10-CM | POA: Diagnosis present

## 2024-10-06 DIAGNOSIS — N390 Urinary tract infection, site not specified: Secondary | ICD-10-CM | POA: Diagnosis not present

## 2024-10-06 DIAGNOSIS — E871 Hypo-osmolality and hyponatremia: Secondary | ICD-10-CM | POA: Diagnosis present

## 2024-10-06 DIAGNOSIS — Z794 Long term (current) use of insulin: Secondary | ICD-10-CM

## 2024-10-06 DIAGNOSIS — D509 Iron deficiency anemia, unspecified: Secondary | ICD-10-CM | POA: Diagnosis present

## 2024-10-06 DIAGNOSIS — E785 Hyperlipidemia, unspecified: Secondary | ICD-10-CM | POA: Diagnosis present

## 2024-10-06 DIAGNOSIS — E861 Hypovolemia: Secondary | ICD-10-CM | POA: Diagnosis present

## 2024-10-06 DIAGNOSIS — L97529 Non-pressure chronic ulcer of other part of left foot with unspecified severity: Secondary | ICD-10-CM | POA: Diagnosis present

## 2024-10-06 DIAGNOSIS — Z9842 Cataract extraction status, left eye: Secondary | ICD-10-CM

## 2024-10-06 DIAGNOSIS — E1165 Type 2 diabetes mellitus with hyperglycemia: Secondary | ICD-10-CM | POA: Diagnosis not present

## 2024-10-06 DIAGNOSIS — Z66 Do not resuscitate: Secondary | ICD-10-CM | POA: Diagnosis present

## 2024-10-06 DIAGNOSIS — L03113 Cellulitis of right upper limb: Principal | ICD-10-CM | POA: Diagnosis present

## 2024-10-06 DIAGNOSIS — Z1152 Encounter for screening for COVID-19: Secondary | ICD-10-CM

## 2024-10-06 DIAGNOSIS — E870 Hyperosmolality and hypernatremia: Secondary | ICD-10-CM | POA: Diagnosis present

## 2024-10-06 DIAGNOSIS — E860 Dehydration: Secondary | ICD-10-CM | POA: Diagnosis present

## 2024-10-06 DIAGNOSIS — M7989 Other specified soft tissue disorders: Secondary | ICD-10-CM | POA: Diagnosis present

## 2024-10-06 DIAGNOSIS — R21 Rash and other nonspecific skin eruption: Secondary | ICD-10-CM | POA: Diagnosis present

## 2024-10-06 DIAGNOSIS — Z881 Allergy status to other antibiotic agents status: Secondary | ICD-10-CM

## 2024-10-06 DIAGNOSIS — R001 Bradycardia, unspecified: Secondary | ICD-10-CM | POA: Diagnosis present

## 2024-10-06 DIAGNOSIS — Z91040 Latex allergy status: Secondary | ICD-10-CM

## 2024-10-06 LAB — CBC WITH DIFFERENTIAL/PLATELET
Abs Immature Granulocytes: 0.03 K/uL (ref 0.00–0.07)
Basophils Absolute: 0 K/uL (ref 0.0–0.1)
Basophils Relative: 0 %
Eosinophils Absolute: 0.1 K/uL (ref 0.0–0.5)
Eosinophils Relative: 1 %
HCT: 37.4 % (ref 36.0–46.0)
Hemoglobin: 11.8 g/dL — ABNORMAL LOW (ref 12.0–15.0)
Immature Granulocytes: 0 %
Lymphocytes Relative: 21 %
Lymphs Abs: 1.8 K/uL (ref 0.7–4.0)
MCH: 31.2 pg (ref 26.0–34.0)
MCHC: 31.6 g/dL (ref 30.0–36.0)
MCV: 98.9 fL (ref 80.0–100.0)
Monocytes Absolute: 0.6 K/uL (ref 0.1–1.0)
Monocytes Relative: 7 %
Neutro Abs: 6.1 K/uL (ref 1.7–7.7)
Neutrophils Relative %: 71 %
Platelets: 291 K/uL (ref 150–400)
RBC: 3.78 MIL/uL — ABNORMAL LOW (ref 3.87–5.11)
RDW: 12.6 % (ref 11.5–15.5)
WBC: 8.5 K/uL (ref 4.0–10.5)
nRBC: 0 % (ref 0.0–0.2)

## 2024-10-06 LAB — URINALYSIS, W/ REFLEX TO CULTURE (INFECTION SUSPECTED)
Bilirubin Urine: NEGATIVE
Glucose, UA: NEGATIVE mg/dL
Hgb urine dipstick: NEGATIVE
Ketones, ur: NEGATIVE mg/dL
Nitrite: NEGATIVE
Protein, ur: 100 mg/dL — AB
Specific Gravity, Urine: 1.017 (ref 1.005–1.030)
pH: 5 (ref 5.0–8.0)

## 2024-10-06 LAB — COMPREHENSIVE METABOLIC PANEL WITH GFR
ALT: 24 U/L (ref 0–44)
AST: 21 U/L (ref 15–41)
Albumin: 4 g/dL (ref 3.5–5.0)
Alkaline Phosphatase: 66 U/L (ref 38–126)
Anion gap: 13 (ref 5–15)
BUN: 29 mg/dL — ABNORMAL HIGH (ref 8–23)
CO2: 28 mmol/L (ref 22–32)
Calcium: 10.3 mg/dL (ref 8.9–10.3)
Chloride: 109 mmol/L (ref 98–111)
Creatinine, Ser: 0.95 mg/dL (ref 0.44–1.00)
GFR, Estimated: >60 mL/min (ref >60)
Glucose, Bld: 132 mg/dL — ABNORMAL HIGH (ref 70–99)
Potassium: 4.1 mmol/L (ref 3.5–5.1)
Sodium: 150 mmol/L — ABNORMAL HIGH (ref 135–145)
Total Bilirubin: 0.6 mg/dL (ref 0.0–1.2)
Total Protein: 7.4 g/dL (ref 6.5–8.1)

## 2024-10-06 LAB — CBG MONITORING, ED
Glucose-Capillary: 130 mg/dL — ABNORMAL HIGH (ref 70–99)
Glucose-Capillary: 52 mg/dL — ABNORMAL LOW (ref 70–99)

## 2024-10-06 LAB — CBC
HCT: 33 % — ABNORMAL LOW (ref 36.0–46.0)
Hemoglobin: 10.6 g/dL — ABNORMAL LOW (ref 12.0–15.0)
MCH: 31.5 pg (ref 26.0–34.0)
MCHC: 32.1 g/dL (ref 30.0–36.0)
MCV: 98.2 fL (ref 80.0–100.0)
Platelets: 263 K/uL (ref 150–400)
RBC: 3.36 MIL/uL — ABNORMAL LOW (ref 3.87–5.11)
RDW: 12.6 % (ref 11.5–15.5)
WBC: 6.7 K/uL (ref 4.0–10.5)
nRBC: 0 % (ref 0.0–0.2)

## 2024-10-06 LAB — PROTIME-INR
INR: 1 (ref 0.8–1.2)
Prothrombin Time: 13.7 s (ref 11.4–15.2)

## 2024-10-06 LAB — RESP PANEL BY RT-PCR (RSV, FLU A&B, COVID)  RVPGX2
Influenza A by PCR: NEGATIVE
Influenza B by PCR: NEGATIVE
Resp Syncytial Virus by PCR: NEGATIVE
SARS Coronavirus 2 by RT PCR: NEGATIVE

## 2024-10-06 LAB — BASIC METABOLIC PANEL WITH GFR
Anion gap: 9 (ref 5–15)
BUN: 29 mg/dL — ABNORMAL HIGH (ref 8–23)
CO2: 29 mmol/L (ref 22–32)
Calcium: 9.5 mg/dL (ref 8.9–10.3)
Chloride: 110 mmol/L (ref 98–111)
Creatinine, Ser: 0.78 mg/dL (ref 0.44–1.00)
GFR, Estimated: >60 mL/min (ref >60)
Glucose, Bld: 58 mg/dL — ABNORMAL LOW (ref 70–99)
Potassium: 3.5 mmol/L (ref 3.5–5.1)
Sodium: 148 mmol/L — ABNORMAL HIGH (ref 135–145)

## 2024-10-06 LAB — GLUCOSE, CAPILLARY: Glucose-Capillary: 112 mg/dL — ABNORMAL HIGH (ref 70–99)

## 2024-10-06 LAB — CK: Total CK: 168 U/L (ref 38–234)

## 2024-10-06 LAB — I-STAT CG4 LACTIC ACID, ED: Lactic Acid, Venous: 2.2 mmol/L (ref 0.5–1.9)

## 2024-10-06 MED ORDER — DEXTROSE 50 % IV SOLN
INTRAVENOUS | Status: AC
  Filled 2024-10-06: qty 50

## 2024-10-06 MED ORDER — SODIUM CHLORIDE 0.9 % IV SOLN
1.0000 g | INTRAVENOUS | Status: DC
  Administered 2024-10-07 – 2024-10-08 (×2): 1 g via INTRAVENOUS
  Filled 2024-10-06 (×2): qty 10

## 2024-10-06 MED ORDER — ONDANSETRON HCL 4 MG PO TABS: 4.0000 mg | ORAL_TABLET | Freq: Four times a day (QID) | ORAL | Status: DC | PRN

## 2024-10-06 MED ORDER — DEXTROSE IN LACTATED RINGERS 5 % IV SOLN: INTRAVENOUS | Status: DC

## 2024-10-06 MED ORDER — ACETAMINOPHEN 325 MG PO TABS: 650.0000 mg | ORAL_TABLET | Freq: Four times a day (QID) | ORAL | Status: DC | PRN

## 2024-10-06 MED ORDER — LACTATED RINGERS IV SOLN: INTRAVENOUS | Status: DC

## 2024-10-06 MED ORDER — DEXTROSE 50 % IV SOLN
12.5000 g | INTRAVENOUS | Status: AC
  Administered 2024-10-06: 12.5 g via INTRAVENOUS

## 2024-10-06 MED ORDER — SODIUM CHLORIDE 0.9 % IV SOLN
2.0000 g | Freq: Once | INTRAVENOUS | Status: AC
  Administered 2024-10-06: 2 g via INTRAVENOUS
  Filled 2024-10-06: qty 20

## 2024-10-06 MED ORDER — ONDANSETRON HCL 4 MG/2ML IJ SOLN: 4.0000 mg | Freq: Four times a day (QID) | INTRAMUSCULAR | Status: DC | PRN

## 2024-10-06 MED ORDER — ENOXAPARIN SODIUM 40 MG/0.4ML IJ SOSY
40.0000 mg | PREFILLED_SYRINGE | INTRAMUSCULAR | Status: DC
  Administered 2024-10-07 – 2024-10-08 (×2): 40 mg via SUBCUTANEOUS
  Filled 2024-10-06 (×3): qty 0.4

## 2024-10-06 MED ORDER — SODIUM CHLORIDE 0.9 % IV BOLUS
1000.0000 mL | Freq: Once | INTRAVENOUS | Status: AC
  Administered 2024-10-06: 1000 mL via INTRAVENOUS

## 2024-10-06 MED ORDER — INSULIN ASPART 100 UNIT/ML IJ SOLN
0.0000 [IU] | INTRAMUSCULAR | Status: DC
  Administered 2024-10-07: 3 [IU] via SUBCUTANEOUS
  Administered 2024-10-07 (×2): 1 [IU] via SUBCUTANEOUS
  Administered 2024-10-07: 5 [IU] via SUBCUTANEOUS
  Administered 2024-10-07 – 2024-10-08 (×2): 1 [IU] via SUBCUTANEOUS
  Administered 2024-10-08: 9 [IU] via SUBCUTANEOUS
  Administered 2024-10-08: 1 [IU] via SUBCUTANEOUS
  Administered 2024-10-08: 3 [IU] via SUBCUTANEOUS
  Filled 2024-10-06: qty 9
  Filled 2024-10-06 (×2): qty 1
  Filled 2024-10-06: qty 5
  Filled 2024-10-06: qty 1
  Filled 2024-10-06 (×2): qty 3
  Filled 2024-10-06 (×2): qty 1

## 2024-10-06 MED ORDER — ACETAMINOPHEN 650 MG RE SUPP: 650.0000 mg | Freq: Four times a day (QID) | RECTAL | Status: DC | PRN

## 2024-10-06 NOTE — ED Provider Notes (Signed)
 [place] EMERGENCY DEPARTMENT AT [facility] Provider Note   CSN: [id number] Arrival date & time: 10/06/24  1415     Patient presents with: No chief complaint on file.   Mary Swanson is a 68 y.o. female.   68 yo F with a chief complaints of a fever.  Noted by her skilled nursing facility.  Patient is demented at baseline.  Reportedly at her baseline.  No obvious reported cough or congestion.  Chronic wound to the left foot.  Swelling to the right hand which reportedly has been there previously.        Prior to Admission medications   Medication Sig Start Date End Date Taking? Authorizing Provider  acetaminophen  (TYLENOL ) 325 MG tablet Take 650 mg by mouth every 8 (eight) hours as needed for moderate pain, fever or headache.    [provider]  amLODipine  (NORVASC ) 10 MG tablet Take 1 tablet (10 mg total) by mouth daily. Patient not taking: Reported on 08/06/2024 01/02/20   Sebastian Toribio GAILS, MD  cyanocobalamin  1000 MCG tablet Take 1 tablet (1,000 mcg total) by mouth daily. 08/14/24   Danford, Lonni SQUIBB, MD  feeding supplement, GLUCERNA SHAKE, (GLUCERNA SHAKE) LIQD Take 237 mLs by mouth 2 (two) times daily between meals. Between meals    [provider]  ferrous sulfate  220 (44 Fe) MG/5ML solution Take 7.5 mLs by mouth daily.    [provider]  furosemide  (LASIX ) 20 MG tablet Take 20 mg by mouth daily. 07/29/24   [provider]  insulin  glargine (LANTUS  SOLOSTAR) 100 UNIT/ML Solostar Pen Inject 18 Units into the skin daily. 06/18/21   Sebastian Toribio GAILS, MD  LOKELMA  5 g packet Take 1 packet by mouth daily. 07/30/24   [provider]  LORazepam  (ATIVAN ) 0.5 MG tablet Take 1 tablet (0.5 mg total) by mouth in the morning and at bedtime. 02/07/24   Amin, Ankit C, MD  metFORMIN  (GLUCOPHAGE ) 1000 MG tablet Take 1,000 mg by mouth 2 (two) times daily. 01/14/24   [provider]  metoprolol  tartrate (LOPRESSOR ) 25 MG tablet Take  0.5 tablets (12.5 mg total) by mouth 2 (two) times daily. 08/13/24   Danford, Lonni SQUIBB, MD  Multiple Vitamin (MULTIVITAMIN WITH MINERALS) TABS tablet Take 1 tablet by mouth daily. 01/02/20   Sebastian Toribio GAILS, MD  rosuvastatin  (CRESTOR ) 20 MG tablet Take 20 mg by mouth at bedtime.    [provider]    Allergies: Erythromycin, Latex, and Lisinopril     Review of Systems  Updated Vital Signs BP (!) 161/103   Pulse 68   Temp 97.8 F (36.6 C) (Axillary)   Resp 14   Ht 5' 6 (1.676 m)   Wt 61 kg   LMP  (LMP Unknown)   SpO2 97%   BMI 21.71 kg/m   Physical Exam Vitals and nursing note reviewed.  Constitutional:      General: She is not in acute distress.    Appearance: She is well-developed. She is not diaphoretic.  HENT:     Head: Normocephalic.     Comments: Bruising to the left side of the face which reportedly happened with recent fall Eyes:     Pupils: Pupils are equal, round, and reactive to light.  Cardiovascular:     Rate and Rhythm: Normal rate and regular rhythm.     Heart sounds: No murmur heard.    No friction rub. No gallop.  Pulmonary:     Effort: Pulmonary effort is normal.  Breath sounds: No wheezing or rales.  Abdominal:     General: There is no distension.     Palpations: Abdomen is soft.     Tenderness: There is no abdominal tenderness.  Musculoskeletal:        General: No tenderness.     Cervical back: Normal range of motion and neck supple.     Comments: Patient has swelling erythema and warmth to the right upper extremity mostly at the dorsum of the hand.  She does have some blistering about the distal forearm on the dorsal aspects.  Patient has what looks like corns to the bottom of the left foot.  No obvious drainage no erythema no warmth.  Skin:    General: Skin is warm and dry.  Neurological:     Mental Status: She is alert.     Comments: Patient is nonverbal.  Moans to pain.     (all labs ordered are listed, but only abnormal  results are displayed) Labs Reviewed  COMPREHENSIVE METABOLIC PANEL WITH GFR - Abnormal; Notable for the following components:      Result Value   Sodium 150 (*)    Glucose, Bld 132 (*)    BUN 29 (*)    All other components within normal limits  CBC WITH DIFFERENTIAL/PLATELET - Abnormal; Notable for the following components:   RBC 3.78 (*)    Hemoglobin 11.8 (*)    All other components within normal limits  URINALYSIS, W/ REFLEX TO CULTURE (INFECTION SUSPECTED) - Abnormal; Notable for the following components:   APPearance HAZY (*)    Protein, ur 100 (*)    Leukocytes,Ua SMALL (*)    Bacteria, UA MANY (*)    All other components within normal limits  I-STAT CG4 LACTIC ACID, ED - Abnormal; Notable for the following components:   Lactic Acid, Venous 2.2 (*)    All other components within normal limits  CULTURE, BLOOD (ROUTINE X 2)  RESP PANEL BY RT-PCR (RSV, FLU A&B, COVID)  RVPGX2  CULTURE, BLOOD (ROUTINE X 2)  URINE CULTURE  PROTIME-INR  CK  I-STAT CG4 LACTIC ACID, ED    EKG: EKG Interpretation Date/Time:  Monday October 06 2024 16:46:19 EST Ventricular Rate:  76 PR Interval:    QRS Duration:  100 QT Interval:  437 QTC Calculation: 489 R Axis:   29  Text Interpretation: likely nsr Low voltage, precordial leads Repol abnrm suggests ischemia, diffuse leads Artifact in lead(s) I II aVR aVL aVF TECHNICALLY DIFFICULT background noise Otherwise no significant change Confirmed by Emil Share [id number] on 10/06/2024 5:23:19 PM  Radiology: [facility] Foot Complete Left Result Date: 10/06/2024 CLINICAL DATA:  Wound of the left hip. EXAM: LEFT FOOT - COMPLETE 3+ VIEW COMPARISON:  None Available. FINDINGS: No acute fracture or dislocation. The bones are mildly osteopenic. No significant arthritic change. No periosteal elevation or bone erosion. Dressing noted along the plantar aspect of the forefoot. IMPRESSION: No acute osseous abnormality. Electronically Signed   By: Vanetta Chou M.D.    On: 10/06/2024 16:38   DG Hand Complete Right Result Date: 10/06/2024 EXAM: 3 OR MORE VIEW(S) XRAY OF THE RIGHT HAND 10/06/2024 04:15:00 PM COMPARISON: None available. CLINICAL HISTORY: Hand pain. FINDINGS: BONES AND JOINTS: No acute fracture. No focal osseous lesion. No joint dislocation. SOFT TISSUES: The soft tissues are unremarkable. IMPRESSION: 1. No acute osseous abnormality. Electronically signed by: Greig Pique MD 10/06/2024 04:37 PM EST RP Workstation: [id number]   DG Chest Port 1 View Result Date: 10/06/2024 CLINICAL  DATA:  Sepsis. EXAM: PORTABLE CHEST 1 VIEW COMPARISON:  Chest radiograph dated 08/17/2024. FINDINGS: No focal consolidation, pleural effusion, pneumothorax. The cardiac silhouette is within limits. Atherosclerotic calcification of the aorta. No acute osseous pathology. Bilateral breast implants with calcified capsules. IMPRESSION: No active disease. Electronically Signed   By: Vanetta Chou M.D.   On: 10/06/2024 16:36     Procedures   Medications Ordered in the ED  sodium chloride  0.9 % bolus 1,000 mL (1,000 mLs Intravenous New Bag/Given 10/06/24 1734)  cefTRIAXone  (ROCEPHIN ) 2 g in sodium chloride  0.9 % 100 mL IVPB (0 g Intravenous Stopped 10/06/24 2043)                                    Medical Decision Making Amount and/or Complexity of Data Reviewed Labs: ordered. Radiology: ordered.   68 yo F with a chief complaints of fever.  This is reported by her skilled nursing facility.  Patient is nonverbal and reportedly this is at her baseline.  On record review patient had a recent fall.  Was seen in the emergency department setting.  She had CT imaging of the head and C-spine that were unremarkable.  It is reported the patient has had right upper extremity swelling the I do not see this previously documented in her chart.  It does appear to be possibly cellulitic.  UA also positive for UTI.  Rocephin  covers both.  May be hemoconcentration with sodium of  150.  CK is normal.  Chest x-ray independently interpreted by me without focal infiltrate or pneumothorax.  No obvious fracture on plain film of the right hand.  The patients results and plan were reviewed and discussed.   Any x-rays performed were independently reviewed by myself.   Differential diagnosis were considered with the presenting HPI.  Medications  sodium chloride  0.9 % bolus 1,000 mL (1,000 mLs Intravenous New Bag/Given 10/06/24 1734)  cefTRIAXone  (ROCEPHIN ) 2 g in sodium chloride  0.9 % 100 mL IVPB (0 g Intravenous Stopped 10/06/24 2043)    Vitals:   10/06/24 1436 10/06/24 1712 10/06/24 2020 10/06/24 2045  BP:  (!) 159/81 (!) 150/63 (!) 161/103  Pulse:  75 66 68  Resp:  17 13 14   Temp:  97.9 F (36.6 C) 97.8 F (36.6 C)   TempSrc:  Axillary Axillary   SpO2:  98% 99% 97%  Weight: 61 kg     Height: 5' 6 (1.676 m)       Final diagnoses:  Cellulitis of right arm    Admission/ observation were discussed with the admitting physician, patient and/or family and they are comfortable with the plan.       Final diagnoses:  Cellulitis of right arm    ED Discharge Orders     None          Emil Share, DO 10/06/24 2204

## 2024-10-06 NOTE — ED Triage Notes (Addendum)
 Pt BIB EMS from [facility]. Pt is demented and not oriented (this is her normal). C/O swelling to right hand (he has had this issue before). Small lumps noted to right arm  Bruises noted to left side of face is previous fall. Ulcer on foot currently dressed.   EMS vitals  BP 136/70 HR 84 SPO2 94 RA CBG 192 100.4

## 2024-10-06 NOTE — H&P (Addendum)
 History and Physical    Patient: Mary Swanson FMW:[id number] DOB: 18-Feb-1956 DOA: 10/06/2024 DOS: the patient was seen and examined on 10/06/2024 PCP: Patient, No Pcp Per  Patient coming from: Facility  Chief Complaint: No chief complaint on file.  HPI: Mary Swanson is a 68 y.o. female with medical history significant of dementia, non-verbal at baseline, chronic left foot wound, T2DM, sent to the emergency department from her facility for evaluation of fever.  History is limited. Swelling of the right hand was also noted, however reported to be there previously.  On EMS arrival temp 100.4. Unclear if tylenol  administered. In the emergency department, she was afebrile, hemodynamically stable, saturating appropriately on room air.  No leukocytosis.  Sodium was noted to be 150.  Chest x-ray unremarkable.  X-ray of the right hand and left foot without acute osseous abnormality.  UA was suggestive of a UTI. Later notified glucose 52 on CBG.     Review of Systems: Review of Systems  Unable to perform ROS: Patient nonverbal    Past Medical History:  Diagnosis Date   ACE-inhibitor cough 03/14/2019   Acute lower UTI 06/16/2021   Acute metabolic encephalopathy 12/28/2019   AKI (acute kidney injury) 12/27/2019   Altered mental status 06/15/2021   Dehydration 12/27/2019   Diabetes mellitus (HCC) 12/28/2019   Diabetes mellitus without complication (HCC)    Diabetic foot infection (HCC) 01/06/2022   Hypernatremia 06/16/2021   Hypertension    Memory loss of unknown cause 12/03/2019   Sepsis due to Streptococcus pyogenes due to right foot cellulitis 01/06/2022   Transaminitis 01/06/2022   Past Surgical History:  Procedure Laterality Date   CATARACT EXTRACTION, BILATERAL Bilateral    Social History:  reports that she has never smoked. She has never used smokeless tobacco. She reports that she does not currently use alcohol. She reports that she does not use drugs.  Allergies  Allergen Reactions    Erythromycin Diarrhea   Latex Hives   Lisinopril  Cough    Family History  Problem Relation Age of Onset   Diabetes Neg Hx     Prior to Admission medications   Medication Sig Start Date End Date Taking? Authorizing Provider  acetaminophen  (TYLENOL ) 325 MG tablet Take 650 mg by mouth every 8 (eight) hours as needed for moderate pain, fever or headache.    [provider]  amLODipine  (NORVASC ) 10 MG tablet Take 1 tablet (10 mg total) by mouth daily. Patient not taking: Reported on 08/06/2024 01/02/20   Sebastian Toribio GAILS, MD  cyanocobalamin  1000 MCG tablet Take 1 tablet (1,000 mcg total) by mouth daily. 08/14/24   Danford, Lonni SQUIBB, MD  feeding supplement, GLUCERNA SHAKE, (GLUCERNA SHAKE) LIQD Take 237 mLs by mouth 2 (two) times daily between meals. Between meals    [provider]  ferrous sulfate  220 (44 Fe) MG/5ML solution Take 7.5 mLs by mouth daily.    [provider]  furosemide  (LASIX ) 20 MG tablet Take 20 mg by mouth daily. 07/29/24   [provider]  insulin  glargine (LANTUS  SOLOSTAR) 100 UNIT/ML Solostar Pen Inject 18 Units into the skin daily. 06/18/21   Sebastian Toribio GAILS, MD  LOKELMA  5 g packet Take 1 packet by mouth daily. 07/30/24   [provider]  LORazepam  (ATIVAN ) 0.5 MG tablet Take 1 tablet (0.5 mg total) by mouth in the morning and at bedtime. 02/07/24   Amin, Ankit C, MD  metFORMIN  (GLUCOPHAGE ) 1000 MG tablet Take 1,000 mg by mouth 2 (two) times daily.  01/14/24   [provider]  metoprolol  tartrate (LOPRESSOR ) 25 MG tablet Take 0.5 tablets (12.5 mg total) by mouth 2 (two) times daily. 08/13/24   Danford, Lonni SQUIBB, MD  Multiple Vitamin (MULTIVITAMIN WITH MINERALS) TABS tablet Take 1 tablet by mouth daily. 01/02/20   Sebastian Toribio GAILS, MD  rosuvastatin  (CRESTOR ) 20 MG tablet Take 20 mg by mouth at bedtime.    [provider]    Physical Exam: Vitals:   10/06/24 1436 10/06/24 1712 10/06/24 2020 10/06/24 2045   BP:  (!) 159/81 (!) 150/63 (!) 161/103  Pulse:  75 66 68  Resp:  17 13 14   Temp:  97.9 F (36.6 C) 97.8 F (36.6 C)   TempSrc:  Axillary Axillary   SpO2:  98% 99% 97%  Weight: 61 kg     Height: 5' 6 (1.676 m)      Physical Exam Vitals and nursing note reviewed.  Constitutional:      General: She is sleeping. She is not in acute distress.    Appearance: Normal appearance.  HENT:     Head:     Comments: Bruising around left eye, appears fairly old  Eyes:     General: No scleral icterus. Cardiovascular:     Rate and Rhythm: Regular rhythm. Bradycardia present.  Pulmonary:     Effort: Pulmonary effort is normal. No respiratory distress.     Breath sounds: No wheezing.  Abdominal:     General: Bowel sounds are normal. There is no distension.     Palpations: Abdomen is soft.     Tenderness: There is no abdominal tenderness. There is no guarding.  Musculoskeletal:        General: Tenderness (left calf) present.     Comments: Left foot with chronic appearing ulcers on the plantar surface of first and fifth MTP's, minimal drainage from the first. No foot swelling or unilateral calf swelling.  Dp pulses equal bilaterally  Right hand somewhat erythematous compared to the left, no swelling of the hand or forearm   Skin:    Capillary Refill: Capillary refill takes less than 2 seconds.  Psychiatric:     Comments: Unable to assess      Data Reviewed:   Labs on Admission: I have personally reviewed following labs and imaging studies  CBC: Recent Labs  Lab 10/06/24 1721  WBC 8.5  NEUTROABS 6.1  HGB 11.8*  HCT 37.4  MCV 98.9  PLT 291   Basic Metabolic Panel: Recent Labs  Lab 10/06/24 1721  NA 150*  K 4.1  CL 109  CO2 28  GLUCOSE 132*  BUN 29*  CREATININE 0.95  CALCIUM  10.3   GFR: Estimated Creatinine Clearance: 51.6 mL/min (by C-G formula based on SCr of 0.95 mg/dL). Liver Function Tests: Recent Labs  Lab 10/06/24 1721  AST 21  ALT 24  ALKPHOS 66   BILITOT 0.6  PROT 7.4  ALBUMIN 4.0   No results for input(s): LIPASE, AMYLASE in the last 168 hours. No results for input(s): AMMONIA in the last 168 hours. Coagulation Profile: Recent Labs  Lab 10/06/24 1721  INR 1.0   Cardiac Enzymes: Recent Labs  Lab 10/06/24 1721  CKTOTAL 168   BNP (last 3 results) No results for input(s): PROBNP in the last 8760 hours. HbA1C: No results for input(s): HGBA1C in the last 72 hours. CBG: No results for input(s): GLUCAP in the last 168 hours. Lipid Profile: No results for input(s): CHOL, HDL, LDLCALC, TRIG, CHOLHDL, LDLDIRECT in the  last 72 hours. Thyroid  Function Tests: No results for input(s): TSH, T4TOTAL, FREET4, T3FREE, THYROIDAB in the last 72 hours. Anemia Panel: No results for input(s): VITAMINB12, FOLATE, FERRITIN, TIBC, IRON, RETICCTPCT in the last 72 hours. Urine analysis:    Component Value Date/Time   COLORURINE YELLOW 10/06/2024 1807   APPEARANCEUR HAZY (A) 10/06/2024 1807   LABSPEC 1.017 10/06/2024 1807   PHURINE 5.0 10/06/2024 1807   GLUCOSEU NEGATIVE 10/06/2024 1807   HGBUR NEGATIVE 10/06/2024 1807   BILIRUBINUR NEGATIVE 10/06/2024 1807   KETONESUR NEGATIVE 10/06/2024 1807   PROTEINUR 100 (A) 10/06/2024 1807   NITRITE NEGATIVE 10/06/2024 1807   LEUKOCYTESUR SMALL (A) 10/06/2024 1807    Radiological Exams on Admission: DG Foot Complete Left Result Date: 10/06/2024 CLINICAL DATA:  Wound of the left hip. EXAM: LEFT FOOT - COMPLETE 3+ VIEW COMPARISON:  None Available. FINDINGS: No acute fracture or dislocation. The bones are mildly osteopenic. No significant arthritic change. No periosteal elevation or bone erosion. Dressing noted along the plantar aspect of the forefoot. IMPRESSION: No acute osseous abnormality. Electronically Signed   By: Vanetta Chou M.D.   On: 10/06/2024 16:38   DG Hand Complete Right Result Date: 10/06/2024 EXAM: 3 OR MORE VIEW(S) XRAY OF THE  RIGHT HAND 10/06/2024 04:15:00 PM COMPARISON: None available. CLINICAL HISTORY: Hand pain. FINDINGS: BONES AND JOINTS: No acute fracture. No focal osseous lesion. No joint dislocation. SOFT TISSUES: The soft tissues are unremarkable. IMPRESSION: 1. No acute osseous abnormality. Electronically signed by: Greig Pique MD 10/06/2024 04:37 PM EST RP Workstation: [id number]   DG Chest Port 1 View Result Date: 10/06/2024 CLINICAL DATA:  Sepsis. EXAM: PORTABLE CHEST 1 VIEW COMPARISON:  Chest radiograph dated 08/17/2024. FINDINGS: No focal consolidation, pleural effusion, pneumothorax. The cardiac silhouette is within limits. Atherosclerotic calcification of the aorta. No acute osseous pathology. Bilateral breast implants with calcified capsules. IMPRESSION: No active disease. Electronically Signed   By: Vanetta Chou M.D.   On: 10/06/2024 16:36       Assessment and Plan:  68 y.o. female with medical history significant of dementia, non-verbal at baseline, chronic left foot wound sent to the emergency department from her facility for evaluation of fever. UA with possible UTI. CXR unremarkable. Found to have hypernatremia with Na 150. Developed hypoglycemia in the ED with glucose 52.  Apparently mental status at baseline.   Fever - noted at facility. Afebrile here.  Otherwise non-septic, hemodynamically stable.  COVID flu and RSV negative.  Chest x-ray negative. -Rule out DVT left lower extremity given left calf tenderness on exam - empiric rocephin   -Follow-up blood cultures - Supportive care  UTI - Rocephin  pending urine culture  Hypernatremia  Hypoglycemia  - suspected hypovolemic/FTT  - IVF,  monitor chemistry for overcorrection  - NPO pending SLP evaluation  --hypoglycemia protocol, hold long acting insulin , D5LR   Chronic left foot wound - Continue wound care  HTN  - hold BB given sinus bradycardia, holding diuretic given dehydration on presentation   T2DM - hold lantus  given  hypoglycemia  - SSI  Lovenox   D5LR @75  Monitor/replace electrolytes  NPO  Advance Care Planning:   Code Status: Prior assume full, no documentation sent with patient from facility     Severity of Illness: The appropriate patient status for this patient is OBSERVATION. Observation status is judged to be reasonable and necessary in order to provide the required intensity of service to ensure the patient's safety. The patient's presenting symptoms, physical exam findings, and initial radiographic and laboratory  data in the context of their medical condition is felt to place them at decreased risk for further clinical deterioration. Furthermore, it is anticipated that the patient will be medically stable for discharge from the hospital within 2 midnights of admission.   Author: Daved JAYSON Pump, DO 10/06/2024 9:55 PM  For on call review [email or web address].

## 2024-10-06 NOTE — ED Notes (Signed)
CBG 130 

## 2024-10-06 NOTE — ED Notes (Signed)
 Unable to obtain second set due to access

## 2024-10-07 ENCOUNTER — Observation Stay (HOSPITAL_BASED_OUTPATIENT_CLINIC_OR_DEPARTMENT_OTHER)

## 2024-10-07 DIAGNOSIS — N390 Urinary tract infection, site not specified: Secondary | ICD-10-CM | POA: Diagnosis present

## 2024-10-07 DIAGNOSIS — Z7984 Long term (current) use of oral hypoglycemic drugs: Secondary | ICD-10-CM | POA: Diagnosis not present

## 2024-10-07 DIAGNOSIS — N3000 Acute cystitis without hematuria: Secondary | ICD-10-CM | POA: Diagnosis not present

## 2024-10-07 DIAGNOSIS — R509 Fever, unspecified: Secondary | ICD-10-CM | POA: Diagnosis present

## 2024-10-07 DIAGNOSIS — E11621 Type 2 diabetes mellitus with foot ulcer: Secondary | ICD-10-CM | POA: Diagnosis present

## 2024-10-07 DIAGNOSIS — E871 Hypo-osmolality and hyponatremia: Secondary | ICD-10-CM | POA: Diagnosis present

## 2024-10-07 DIAGNOSIS — L03113 Cellulitis of right upper limb: Secondary | ICD-10-CM | POA: Diagnosis present

## 2024-10-07 DIAGNOSIS — F0390 Unspecified dementia without behavioral disturbance: Secondary | ICD-10-CM | POA: Diagnosis present

## 2024-10-07 DIAGNOSIS — Z794 Long term (current) use of insulin: Secondary | ICD-10-CM | POA: Diagnosis not present

## 2024-10-07 DIAGNOSIS — M7989 Other specified soft tissue disorders: Secondary | ICD-10-CM | POA: Diagnosis present

## 2024-10-07 DIAGNOSIS — E785 Hyperlipidemia, unspecified: Secondary | ICD-10-CM | POA: Diagnosis present

## 2024-10-07 DIAGNOSIS — R52 Pain, unspecified: Secondary | ICD-10-CM | POA: Diagnosis not present

## 2024-10-07 DIAGNOSIS — I10 Essential (primary) hypertension: Secondary | ICD-10-CM | POA: Diagnosis present

## 2024-10-07 DIAGNOSIS — R21 Rash and other nonspecific skin eruption: Secondary | ICD-10-CM | POA: Diagnosis present

## 2024-10-07 DIAGNOSIS — E861 Hypovolemia: Secondary | ICD-10-CM | POA: Diagnosis present

## 2024-10-07 DIAGNOSIS — Z1152 Encounter for screening for COVID-19: Secondary | ICD-10-CM | POA: Diagnosis not present

## 2024-10-07 DIAGNOSIS — E1165 Type 2 diabetes mellitus with hyperglycemia: Secondary | ICD-10-CM | POA: Diagnosis not present

## 2024-10-07 DIAGNOSIS — E870 Hyperosmolality and hypernatremia: Secondary | ICD-10-CM | POA: Diagnosis present

## 2024-10-07 DIAGNOSIS — Z9842 Cataract extraction status, left eye: Secondary | ICD-10-CM | POA: Diagnosis not present

## 2024-10-07 DIAGNOSIS — Z9841 Cataract extraction status, right eye: Secondary | ICD-10-CM | POA: Diagnosis not present

## 2024-10-07 DIAGNOSIS — Z66 Do not resuscitate: Secondary | ICD-10-CM | POA: Diagnosis present

## 2024-10-07 DIAGNOSIS — E11649 Type 2 diabetes mellitus with hypoglycemia without coma: Secondary | ICD-10-CM | POA: Diagnosis present

## 2024-10-07 DIAGNOSIS — L97529 Non-pressure chronic ulcer of other part of left foot with unspecified severity: Secondary | ICD-10-CM | POA: Diagnosis present

## 2024-10-07 DIAGNOSIS — D509 Iron deficiency anemia, unspecified: Secondary | ICD-10-CM | POA: Diagnosis present

## 2024-10-07 DIAGNOSIS — E872 Acidosis, unspecified: Secondary | ICD-10-CM | POA: Diagnosis present

## 2024-10-07 DIAGNOSIS — E860 Dehydration: Secondary | ICD-10-CM | POA: Diagnosis present

## 2024-10-07 DIAGNOSIS — Z79899 Other long term (current) drug therapy: Secondary | ICD-10-CM | POA: Diagnosis not present

## 2024-10-07 LAB — BASIC METABOLIC PANEL WITH GFR
Anion gap: 8 (ref 5–15)
BUN: 28 mg/dL — ABNORMAL HIGH (ref 8–23)
CO2: 30 mmol/L (ref 22–32)
Calcium: 9.5 mg/dL (ref 8.9–10.3)
Chloride: 109 mmol/L (ref 98–111)
Creatinine, Ser: 0.77 mg/dL (ref 0.44–1.00)
GFR, Estimated: >60 mL/min (ref >60)
Glucose, Bld: 115 mg/dL — ABNORMAL HIGH (ref 70–99)
Potassium: 3.4 mmol/L — ABNORMAL LOW (ref 3.5–5.1)
Sodium: 147 mmol/L — ABNORMAL HIGH (ref 135–145)

## 2024-10-07 LAB — CBC
HCT: 34.2 % — ABNORMAL LOW (ref 36.0–46.0)
Hemoglobin: 10.7 g/dL — ABNORMAL LOW (ref 12.0–15.0)
MCH: 30.8 pg (ref 26.0–34.0)
MCHC: 31.3 g/dL (ref 30.0–36.0)
MCV: 98.6 fL (ref 80.0–100.0)
Platelets: 266 K/uL (ref 150–400)
RBC: 3.47 MIL/uL — ABNORMAL LOW (ref 3.87–5.11)
RDW: 12.7 % (ref 11.5–15.5)
WBC: 6.3 K/uL (ref 4.0–10.5)
nRBC: 0 % (ref 0.0–0.2)

## 2024-10-07 LAB — GLUCOSE, CAPILLARY
Glucose-Capillary: 137 mg/dL — ABNORMAL HIGH (ref 70–99)
Glucose-Capillary: 129 mg/dL — ABNORMAL HIGH (ref 70–99)
Glucose-Capillary: 139 mg/dL — ABNORMAL HIGH (ref 70–99)
Glucose-Capillary: 283 mg/dL — ABNORMAL HIGH (ref 70–99)
Glucose-Capillary: 294 mg/dL — ABNORMAL HIGH (ref 70–99)

## 2024-10-07 LAB — MRSA NEXT GEN BY PCR, NASAL: MRSA by PCR Next Gen: DETECTED — AB

## 2024-10-07 MED ORDER — HYDRALAZINE HCL 20 MG/ML IJ SOLN
5.0000 mg | Freq: Four times a day (QID) | INTRAMUSCULAR | Status: DC | PRN
  Administered 2024-10-07 – 2024-10-08 (×2): 5 mg via INTRAVENOUS
  Filled 2024-10-07 (×2): qty 1

## 2024-10-07 MED ORDER — AMLODIPINE BESYLATE 10 MG PO TABS
10.0000 mg | ORAL_TABLET | Freq: Every day | ORAL | Status: DC
  Administered 2024-10-07 – 2024-10-08 (×2): 10 mg via ORAL
  Filled 2024-10-07 (×2): qty 1

## 2024-10-07 MED ORDER — ORAL CARE MOUTH RINSE
15.0000 mL | OROMUCOSAL | Status: DC
  Administered 2024-10-07 – 2024-10-08 (×6): 15 mL via OROMUCOSAL

## 2024-10-07 MED ORDER — ORAL CARE MOUTH RINSE: 15.0000 mL | OROMUCOSAL | Status: DC | PRN

## 2024-10-07 MED ORDER — MUPIROCIN 2 % EX OINT
1.0000 | TOPICAL_OINTMENT | Freq: Two times a day (BID) | CUTANEOUS | Status: DC
  Administered 2024-10-07 – 2024-10-08 (×3): 1 via NASAL
  Filled 2024-10-07: qty 22

## 2024-10-07 MED ORDER — POTASSIUM CHLORIDE 20 MEQ PO PACK
60.0000 meq | PACK | Freq: Once | ORAL | Status: AC
  Administered 2024-10-07: 60 meq via ORAL
  Filled 2024-10-07: qty 3

## 2024-10-07 MED ORDER — DEXTROSE IN LACTATED RINGERS 5 % IV SOLN: INTRAVENOUS | Status: AC

## 2024-10-07 MED ORDER — CHLORHEXIDINE GLUCONATE CLOTH 2 % EX PADS
6.0000 | MEDICATED_PAD | Freq: Every day | CUTANEOUS | Status: DC
  Administered 2024-10-07 – 2024-10-08 (×2): 6 via TOPICAL

## 2024-10-07 MED ORDER — HYDROCERIN EX CREA
TOPICAL_CREAM | Freq: Two times a day (BID) | CUTANEOUS | Status: DC
  Filled 2024-10-07: qty 113

## 2024-10-07 NOTE — Plan of Care (Signed)
  Problem: Education: Goal: Ability to describe self-care measures that may prevent or decrease complications (Diabetes Survival Skills Education) will improve Outcome: Progressing Goal: Individualized Educational Video(s) Outcome: Progressing   Problem: Coping: Goal: Ability to adjust to condition or change in health will improve Outcome: Progressing   Problem: Fluid Volume: Goal: Ability to maintain a balanced intake and output will improve Outcome: Progressing   Problem: Health Behavior/Discharge Planning: Goal: Ability to identify and utilize available resources and services will improve Outcome: Progressing Goal: Ability to manage health-related needs will improve Outcome: Progressing   Problem: Metabolic: Goal: Ability to maintain appropriate glucose levels will improve Outcome: Progressing   Problem: Nutritional: Goal: Maintenance of adequate nutrition will improve Outcome: Progressing Goal: Progress toward achieving an optimal weight will improve Outcome: Progressing   Problem: Skin Integrity: Goal: Risk for impaired skin integrity will decrease Outcome: Progressing   Problem: Tissue Perfusion: Goal: Adequacy of tissue perfusion will improve Outcome: Progressing   Problem: Health Behavior/Discharge Planning: Goal: Ability to manage health-related needs will improve Outcome: Progressing   Problem: Activity: Goal: Risk for activity intolerance will decrease Outcome: Progressing   Problem: Pain Managment: Goal: General experience of comfort will improve and/or be controlled Outcome: Progressing   Problem: Safety: Goal: Ability to remain free from injury will improve Outcome: Progressing

## 2024-10-07 NOTE — Progress Notes (Addendum)
 PROGRESS NOTE    Mary Swanson  FMW:[id number] DOB: 13-Apr-1956 DOA: 10/06/2024 PCP: Patient, No Pcp Per    Brief Narrative:   Mary Swanson is a 68 y.o. female with past medical history significant for dementia (nonverbal at baseline), chronic left foot wound, DM2, HTN, HLD, IDA who presented to [facility] ED on 10/06/2024 via EMS from [facility] for fever.  History limited due to her underlying dementia and baseline nonverbal status.  In the ED, temperature 98.2 F, HR 75, RR 17, BP 129/103, SpO2 100% on room air.  WBC 8.5, hemoglobin 11.8, platelet count 291.  Sodium 150, potassium 4.1, chloride 109, CO2 28, glucose 132, BUN 29, creatinine 0.95.  AST 21, ALT 24, total bilirubin 0.6.  Lactic acid 2.2.  COVID/influenza/RSV PCR negative.  Urinalysis with small leukocytes, negative nitrite, many bacteria, 11-20 WBCs.  Chest x-ray with no active cardiopulmonary pulm disease process.  Left foot x-ray with no acute osseous abnormality.  Right hand x-ray with no acute osseous abnormality.  Left lower extremity vascular duplex was negative for DVT.  Patient started on IV antibiotics.  TRH consulted for admission for further evaluation and management.  Assessment & Plan:   Urinary tract infection Lactic acidosis Patient presenting with reported fever by staff at the nursing facility.  Patient was afebrile on arrival without leukocytosis.  Urinalysis with small leukocytes, negative nitrite, many bacteria, 11-20 WBCs on arrival.  Patient is nonverbal at baseline unable to obtain any history regarding possible urinary tract symptoms. -- Urine culture: Pending -- Ceftriaxone  1 g IV every 24 hours   Hypernatremia Sodium 150 on arrival, likely hypovolemic hyponatremia in the setting of poor oral intake/dehydration. -- Na 849>851>852 -- D5 LR at 75 mL/h  Hypoglycemia History of DM2 Hemoglobin A1c 5.7 on 08/06/2024.  Well-controlled.  At baseline on Lantus  18 units subcutaneously daily, metformin  1000 mg  p.o. twice daily -- Hold home Lantus  and metformin  due to hypoglycemia -- Sensitive SSI for coverage -- CBG every 4 hours  HTN On amlodipine  10 mg p.o. daily, metoprolol  tartrate 12.5 mg p.o. twice daily -- restart amlodipine  10 mg PO daily  -- Hydralazine  PRN  HLD On Crestor  20 mg p.o. nightly, will hold for now  Iron deficiency anemia -- Hold home iron supplements Home speech therapy evaluation  Chronic left foot wound X-ray left foot with no acute osseous abnormality. -- Wound RN consult  Dementia --SLP evaluation to ensure able to adequately intake oral medications/food -- Hold home Ativan  for now   DVT prophylaxis: enoxaparin  (LOVENOX ) injection 40 mg Start: 10/07/24 1000    Code Status: Limited: Do not attempt resuscitation (DNR) -DNR-LIMITED -Do Not Intubate/DNI  Family Communication:   Disposition Plan:  Level of care: Telemetry Status is: Observation The patient remains OBS appropriate and will d/c before 2 midnights.    Consultants:  None  Procedures:  None  Antimicrobials:  Ceftriaxone  11/10>>   Subjective: Patient seen examined bedside, lying in bed.  Sitter present at bedside.  Alert, remains nonverbal which is her baseline.  No family present.  Unable to obtain further ROS given dementia and nonverbal status.  Remains on IV antibiotics.  Awaiting speech therapy evaluation, urine culture results.  No acute events overnight per nursing staff.  Objective: Vitals:   10/06/24 2314 10/06/24 2334 10/07/24 0408 10/07/24 0722  BP: (!) 152/66 (!) 168/66 (!) 156/71 (!) 147/66  Pulse: 66 (!) 51 (!) 52 (!) 58  Resp: 17 18 18 18   Temp: 97.6 F (36.4 C)  97.6 F (36.4 C) (!) 97.4 F (36.3 C) 97.7 F (36.5 C)  TempSrc: Axillary Oral Oral Oral  SpO2: 99% 99% 97% 99%  Weight:      Height:        Intake/Output Summary (Last 24 hours) at 10/07/2024 1147 Last data filed at 10/07/2024 0929 Gross per 24 hour  Intake 594.2 ml  Output 950 ml  Net -355.8 ml    Filed Weights   10/06/24 1436  Weight: 61 kg    Examination:  Physical Exam: GEN: NAD, alert, nonverbal which is at baseline; elderly and appears HEENT: NCAT, PERRL, EOMI, sclera clear, dry mucous membrane PULM: CTAB w/o wheezes/crackles, normal respiratory effort, on room air CV: RRR  GI: abd soft, nondistended, + BS MSK: no peripheral edema, left foot wound as depicted below Integumentary: Left foot wound as suspected below, dry/intact, no rashes or wounds    Data Reviewed: I have personally reviewed following labs and imaging studies  CBC: Recent Labs  Lab 10/06/24 1721 10/06/24 2258 10/07/24 0033  WBC 8.5 6.7 6.3  NEUTROABS 6.1  --   --   HGB 11.8* 10.6* 10.7*  HCT 37.4 33.0* 34.2*  MCV 98.9 98.2 98.6  PLT 291 263 266   Basic Metabolic Panel: Recent Labs  Lab 10/06/24 1721 10/06/24 2258 10/07/24 0033  NA 150* 148* 147*  K 4.1 3.5 3.4*  CL 109 110 109  CO2 28 29 30   GLUCOSE 132* 58* 115*  BUN 29* 29* 28*  CREATININE 0.95 0.78 0.77  CALCIUM  10.3 9.5 9.5   GFR: Estimated Creatinine Clearance: 61.3 mL/min (by C-G formula based on SCr of 0.77 mg/dL). Liver Function Tests: Recent Labs  Lab 10/06/24 1721  AST 21  ALT 24  ALKPHOS 66  BILITOT 0.6  PROT 7.4  ALBUMIN 4.0   No results for input(s): LIPASE, AMYLASE in the last 168 hours. No results for input(s): AMMONIA in the last 168 hours. Coagulation Profile: Recent Labs  Lab 10/06/24 1721  INR 1.0   Cardiac Enzymes: Recent Labs  Lab 10/06/24 1721  CKTOTAL 168   BNP (last 3 results) No results for input(s): PROBNP in the last 8760 hours. HbA1C: No results for input(s): HGBA1C in the last 72 hours. CBG: Recent Labs  Lab 10/06/24 2233 10/06/24 2311 10/06/24 2337 10/07/24 0347 10/07/24 0731  GLUCAP 52* 130* 112* 137* 129*   Lipid Profile: No results for input(s): CHOL, HDL, LDLCALC, TRIG, CHOLHDL, LDLDIRECT in the last 72 hours. Thyroid  Function Tests: No  results for input(s): TSH, T4TOTAL, FREET4, T3FREE, THYROIDAB in the last 72 hours. Anemia Panel: No results for input(s): VITAMINB12, FOLATE, FERRITIN, TIBC, IRON, RETICCTPCT in the last 72 hours. Sepsis Labs: Recent Labs  Lab 10/06/24 1719  LATICACIDVEN 2.2*    Recent Results (from the past 240 hours)  Resp panel by RT-PCR (RSV, Flu A&B, Covid) Anterior Nasal Swab     Status: None   Collection Time: 10/06/24  4:28 PM   Specimen: Anterior Nasal Swab  Result Value Ref Range Status   SARS Coronavirus 2 by RT PCR NEGATIVE NEGATIVE Final    Comment: (NOTE) SARS-CoV-2 target nucleic acids are NOT DETECTED.  The SARS-CoV-2 RNA is generally detectable in upper respiratory specimens during the acute phase of infection. The lowest concentration of SARS-CoV-2 viral copies this assay can detect is 138 copies/mL. A negative result does not preclude SARS-Cov-2 infection and should not be used as the sole basis for treatment or other patient management decisions. A negative result  may occur with  improper specimen collection/handling, submission of specimen other than nasopharyngeal swab, presence of viral mutation(s) within the areas targeted by this assay, and inadequate number of viral copies(<138 copies/mL). A negative result must be combined with clinical observations, patient history, and epidemiological information. The expected result is Negative.  Fact Sheet for Patients:  bloggercourse.com  Fact Sheet for Healthcare Providers:  seriousbroker.it  This test is no t yet approved or cleared by the [place]  FDA and  has been authorized for detection and/or diagnosis of SARS-CoV-2 by FDA under an Emergency Use Authorization (EUA). This EUA will remain  in effect (meaning this test can be used) for the duration of the COVID-19 declaration under Section 564(b)(1) of the Act, 21 U.S.C.section 360bbb-3(b)(1),  unless the authorization is terminated  or revoked sooner.       Influenza A by PCR NEGATIVE NEGATIVE Final   Influenza B by PCR NEGATIVE NEGATIVE Final    Comment: (NOTE) The Xpert Xpress SARS-CoV-2/FLU/RSV plus assay is intended as an aid in the diagnosis of influenza from Nasopharyngeal swab specimens and should not be used as a sole basis for treatment. Nasal washings and aspirates are unacceptable for Xpert Xpress SARS-CoV-2/FLU/RSV testing.  Fact Sheet for Patients: bloggercourse.com  Fact Sheet for Healthcare Providers: seriousbroker.it  This test is not yet approved or cleared by the [place]  FDA and has been authorized for detection and/or diagnosis of SARS-CoV-2 by FDA under an Emergency Use Authorization (EUA). This EUA will remain in effect (meaning this test can be used) for the duration of the COVID-19 declaration under Section 564(b)(1) of the Act, 21 U.S.C. section 360bbb-3(b)(1), unless the authorization is terminated or revoked.     Resp Syncytial Virus by PCR NEGATIVE NEGATIVE Final    Comment: (NOTE) Fact Sheet for Patients: bloggercourse.com  Fact Sheet for Healthcare Providers: seriousbroker.it  This test is not yet approved or cleared by the [place]  FDA and has been authorized for detection and/or diagnosis of SARS-CoV-2 by FDA under an Emergency Use Authorization (EUA). This EUA will remain in effect (meaning this test can be used) for the duration of the COVID-19 declaration under Section 564(b)(1) of the Act, 21 U.S.C. section 360bbb-3(b)(1), unless the authorization is terminated or revoked.  Performed at [facility], [address], [place], [place] [address]   Blood Culture (routine x 2)     Status: None (Preliminary result)   Collection Time: 10/06/24  5:08 PM   Specimen: BLOOD RIGHT ARM  Result Value Ref  Range Status   Specimen Description   Final    BLOOD RIGHT ARM Performed at [facility], [address], [place], [place] [address]    Special Requests   Final    BOTTLES DRAWN AEROBIC AND ANAEROBIC Blood Culture adequate volume Performed at [facility], [address], [place], [place] [address]    Culture   Final    NO GROWTH < 12 HOURS Performed at [facility], [address] [place], [place] [address]    Report Status PENDING  Incomplete  MRSA Next Gen by PCR, Nasal     Status: Abnormal   Collection Time: 10/06/24 11:47 PM   Specimen: Nasal Mucosa; Nasal Swab  Result Value Ref Range Status   MRSA by PCR Next Gen DETECTED (A) NOT DETECTED Final    Comment: (NOTE) The GeneXpert MRSA Assay (FDA approved for NASAL specimens only), is one component of a comprehensive MRSA colonization surveillance  program. It is not intended to diagnose MRSA infection nor to guide or monitor treatment for MRSA infections. Test performance is not FDA approved in patients less than 48 years old. Performed at [facility], [address], [place], [place] [address]   Blood Culture (routine x 2)     Status: None (Preliminary result)   Collection Time: 10/07/24 12:33 AM   Specimen: BLOOD RIGHT HAND  Result Value Ref Range Status   Specimen Description   Final    BLOOD RIGHT HAND Performed at [facility], [address] [place], [place] [address]    Special Requests   Final    Blood Culture adequate volume BOTTLES DRAWN AEROBIC AND ANAEROBIC Performed at [facility], [address], [place], [place] [address]    Culture PENDING  Incomplete   Report Status PENDING  Incomplete         Radiology Studies: VAS US  LOWER EXTREMITY VENOUS (DVT) Result Date: 10/07/2024  Lower Venous DVT Study Patient Name:  Mary Swanson  Date of Exam:   10/07/2024 Medical Rec #: [id number]     Accession #:    [id number] Date of Birth:  1956/09/19    Patient Gender: F Patient Age:   40 years Exam Location:  [facility] Procedure:      VAS US  LOWER EXTREMITY VENOUS (DVT) Referring Phys: DAVED [facility] --------------------------------------------------------------------------------  Indications: Swelling, and Pain. Other Indications: Chronic left foot wound. Comparison Study: 08/06/24 - Negative Performing Technologist: Ricka Sturdivant-Jones RDMS, RVT  Examination Guidelines: A complete evaluation includes B-mode imaging, spectral Doppler, color Doppler, and power Doppler as needed of all accessible portions of each vessel. Bilateral testing is considered an integral part of a complete examination. Limited examinations for reoccurring indications may be performed as noted. The reflux portion of the exam is performed with the patient in reverse Trendelenburg.  +-----+---------------+---------+-----------+----------+--------------+ RIGHTCompressibilityPhasicitySpontaneityPropertiesThrombus Aging +-----+---------------+---------+-----------+----------+--------------+ CFV  Full           Yes      Yes                                 +-----+---------------+---------+-----------+----------+--------------+ SFJ  Full                                                        +-----+---------------+---------+-----------+----------+--------------+   +---------+---------------+---------+-----------+----------+--------------+ LEFT     CompressibilityPhasicitySpontaneityPropertiesThrombus Aging +---------+---------------+---------+-----------+----------+--------------+ CFV      Full           Yes      Yes                                 +---------+---------------+---------+-----------+----------+--------------+ SFJ      Full                                                        +---------+---------------+---------+-----------+----------+--------------+ FV Prox  Full                                                         +---------+---------------+---------+-----------+----------+--------------+  FV Mid   Full           Yes      Yes                                 +---------+---------------+---------+-----------+----------+--------------+ FV DistalFull                                                        +---------+---------------+---------+-----------+----------+--------------+ PFV      Full                                                        +---------+---------------+---------+-----------+----------+--------------+ POP      Full           Yes      Yes                                 +---------+---------------+---------+-----------+----------+--------------+ PTV      Full                                                        +---------+---------------+---------+-----------+----------+--------------+ PERO     Full                                                        +---------+---------------+---------+-----------+----------+--------------+    Summary: RIGHT: - No evidence of common femoral vein obstruction.   LEFT: - There is no evidence of deep vein thrombosis in the lower extremity.  - No cystic structure found in the popliteal fossa.  *See table(s) above for measurements and observations.    Preliminary    DG Foot Complete Left Result Date: 10/06/2024 CLINICAL DATA:  Wound of the left hip. EXAM: LEFT FOOT - COMPLETE 3+ VIEW COMPARISON:  None Available. FINDINGS: No acute fracture or dislocation. The bones are mildly osteopenic. No significant arthritic change. No periosteal elevation or bone erosion. Dressing noted along the plantar aspect of the forefoot. IMPRESSION: No acute osseous abnormality. Electronically Signed   By: Vanetta Chou M.D.   On: 10/06/2024 16:38   DG Hand Complete Right Result Date: 10/06/2024 EXAM: 3 OR MORE VIEW(S) XRAY OF THE RIGHT HAND 10/06/2024 04:15:00 PM COMPARISON: None available. CLINICAL HISTORY: Hand pain. FINDINGS: BONES AND  JOINTS: No acute fracture. No focal osseous lesion. No joint dislocation. SOFT TISSUES: The soft tissues are unremarkable. IMPRESSION: 1. No acute osseous abnormality. Electronically signed by: Greig Pique MD 10/06/2024 04:37 PM EST RP Workstation: [id number]   DG Chest Port 1 View Result Date: 10/06/2024 CLINICAL DATA:  Sepsis. EXAM: PORTABLE CHEST 1 VIEW COMPARISON:  Chest radiograph dated 08/17/2024. FINDINGS: No focal consolidation, pleural effusion, pneumothorax. The cardiac silhouette is within limits. Atherosclerotic calcification of the aorta.  No acute osseous pathology. Bilateral breast implants with calcified capsules. IMPRESSION: No active disease. Electronically Signed   By: Vanetta Chou M.D.   On: 10/06/2024 16:36        Scheduled Meds:  Chlorhexidine  Gluconate Cloth  6 each Topical Daily   enoxaparin  (LOVENOX ) injection  40 mg Subcutaneous Q24H   insulin  aspart  0-9 Units Subcutaneous Q4H   mupirocin  ointment  1 Application Nasal BID   mouth rinse  15 mL Mouth Rinse 4 times per day   potassium chloride   60 mEq Oral Once   Continuous Infusions:  cefTRIAXone  (ROCEPHIN )  IV 1 g (10/07/24 1030)   dextrose  5% lactated ringers  Stopped (10/07/24 1026)     LOS: 0 days    Time spent: 51 minutes spent on 10/07/2024 caring for this patient face-to-face including chart review, ordering labs/tests, documenting, discussion with nursing staff, consultants, updating family and interview/physical exam    Camellia PARAS Dierre Crevier, DO [facility] Available via Epic secure chat 7am-7pm After these hours, please refer to coverage provider listed on [email or web address] 10/07/2024, 11:47 AM

## 2024-10-07 NOTE — Consult Note (Signed)
 WOC Nurse Consult Note: Reason for Consult: Left Foot wound Wound type: Plantar DFU Pressure Injury POA:No Measurement: approx 5x5 cm, 4x4 cm Wound bed: dehydrated sloughing Drainage no drainage; dry, malodor Periwound: atrophic tissue, scaring, induration Plantar wound over great toe met and 5th toe met Dressing procedure/placement/frequency: Daily Cleanse left foot plantar lesions with normal saline, pat dry well with gauze, apply chlorhexidine  2% swab over dry lesions and reapply 4x4 foam dressing. Place protective boots and off-load heels onto pillows. Assess skin every shift.   Recommend application of antimicrobial agent to reduce surface bioburden, patient will need follow-up with podiatry  LLE 10/07/24 .    WOC will not follow and will remove patient from census task list. Please reconsult if wound worsens in condition and notify provider.   Sherrilyn Hals MSN RN CWOCN WOC [facility]  [phone] (Available from 7-3 pm Mon-Friday)

## 2024-10-07 NOTE — Plan of Care (Signed)

## 2024-10-07 NOTE — Progress Notes (Signed)
 Clinical/Bedside Swallow Evaluation Patient Details  Name: Mary Swanson MRN: [id number] Date of Birth: August 16, 1956  Today's Date: 10/07/2024 Time: SLP Start Time (ACUTE ONLY): 1430 SLP Stop Time (ACUTE ONLY): 1450 SLP Time Calculation (min) (ACUTE ONLY): 20 min  Past Medical History:  Past Medical History:  Diagnosis Date   ACE-inhibitor cough 03/14/2019   Acute lower UTI 06/16/2021   Acute metabolic encephalopathy 12/28/2019   AKI (acute kidney injury) 12/27/2019   Altered mental status 06/15/2021   Dehydration 12/27/2019   Diabetes mellitus (HCC) 12/28/2019   Diabetes mellitus without complication (HCC)    Diabetic foot infection (HCC) 01/06/2022   Hypernatremia 06/16/2021   Hypertension    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  Swallow evaluation ordered.  Pt on regular but no concentrated sweets - finger foods at her facility.    Assessment / Plan / Recommendation  Clinical Impression  Patient currently having difficulty self feeding due to her dementia impacting motor planning. She did not follow directions but does not demonstrate cranial nerve deficits via observation. Pt consumed Ensure Milkshake via straw, straw sips of water and 3 bites of graham crackers. She did not demonstrate any clinical indication of dysphagia nor aspiration but needed encouragement to eat.  Slightly prolonged mastication with solids noted but oral clearance from limited observing appeared clear.  Empty lunch tray at bedside but no visitors present to determine how  much she consumed.  Pt's swallow is functional but she will require assistance to self feed at this time due to her current illness.  Of note, she is receiving finger foods of no concentrated sweets at her facility.  Recommend continue diet - tdrialing finger foods to help her with intake.  No SLP follow up indicated. Thanks for this consult. SLP Visit Diagnosis: Dysphagia, unspecified (R13.10)    Aspiration Risk  Risk for inadequate nutrition/hydration;Mild aspiration risk    Diet Recommendation Regular;Thin liquid    Liquid Administration via: Cup;Straw;Spoon Medication Administration: Whole meds with liquid Supervision: Patient able to self feed Compensations: Slow rate;Small sips/bites (start intake with liquids, encourage self feeding as able) Postural Changes: Seated upright at 90 degrees;Remain upright for at least 30 minutes after po intake    Other  Recommendations Oral Care Recommendations: Oral care BID Caregiver Recommendations: Other (Comment)     Assistance Recommended at Discharge  N/a  Functional Status Assessment Patient has had a recent decline in their functional status and demonstrates the ability to make significant improvements in function in a reasonable and predictable amount of time.  Frequency and Duration     N/a       Prognosis   good     Swallow Study   General Date of Onset: 10/07/24 HPI: 68 yo female with past medical history significant for dementia (nonverbal at baseline), chronic left foot wound, DM2, HTN, HLD, IDA who presented to [facility] ED on 10/06/2024 via EMS from [facility] for fever.  Swallow evaluation ordered.  Pt on regular but no concentrated sweets - finger foods at her facility. Type of Study:  Bedside Swallow Evaluation Previous Swallow Assessment: none in epic Diet Prior to this Study: Regular;Thin liquids (Level 0) Temperature Spikes Noted: Yes Respiratory Status: Room air History of Recent Intubation:  No Behavior/Cognition: Alert;Doesn't follow directions Oral Cavity Assessment: Within Functional Limits Oral Care Completed by SLP: No (pt declined to allow oral care) Oral Cavity - Dentition: Adequate natural dentition Vision: Functional for self-feeding Self-Feeding Abilities: Other (Comment);Total assist;Needs set up Patient Positioning: Upright in bed Baseline Vocal Quality: Low vocal intensity;Other (comment) (when she spoke, was normal but decreased strength) Volitional Cough: Cognitively unable to elicit Volitional Swallow: Unable to elicit    Oral/Motor/Sensory Function Overall Oral Motor/Sensory Function: Within functional limits   Ice Chips Ice chips: Not tested   Thin Liquid Thin Liquid: Within functional limits    Nectar Thick Nectar Thick Liquid: Within functional limits   Honey Thick Honey Thick Liquid: Not tested   Puree Puree: Within functional limits Presentation: Spoon   Solid     Solid: Within functional limits Presentation: Self Fed      Mary Swanson 10/07/2024,2:59 PM   [name], MS [facility] SLP Acute Rehab Services Office [phone]

## 2024-10-07 NOTE — Progress Notes (Signed)
 Unable to complete admission questions at this time as patient is nonverbal at baseline.

## 2024-10-08 DIAGNOSIS — R509 Fever, unspecified: Secondary | ICD-10-CM | POA: Diagnosis not present

## 2024-10-08 LAB — PHOSPHORUS: Phosphorus: 2.4 mg/dL — ABNORMAL LOW (ref 2.5–4.6)

## 2024-10-08 LAB — CBC
HCT: 33.5 % — ABNORMAL LOW (ref 36.0–46.0)
Hemoglobin: 10.8 g/dL — ABNORMAL LOW (ref 12.0–15.0)
MCH: 31 pg (ref 26.0–34.0)
MCHC: 32.2 g/dL (ref 30.0–36.0)
MCV: 96.3 fL (ref 80.0–100.0)
Platelets: 261 K/uL (ref 150–400)
RBC: 3.48 MIL/uL — ABNORMAL LOW (ref 3.87–5.11)
RDW: 12.7 % (ref 11.5–15.5)
WBC: 6.8 K/uL (ref 4.0–10.5)
nRBC: 0 % (ref 0.0–0.2)

## 2024-10-08 LAB — GLUCOSE, CAPILLARY
Glucose-Capillary: 142 mg/dL — ABNORMAL HIGH (ref 70–99)
Glucose-Capillary: 144 mg/dL — ABNORMAL HIGH (ref 70–99)
Glucose-Capillary: 213 mg/dL — ABNORMAL HIGH (ref 70–99)
Glucose-Capillary: 360 mg/dL — ABNORMAL HIGH (ref 70–99)

## 2024-10-08 LAB — BASIC METABOLIC PANEL WITH GFR
Anion gap: 7 (ref 5–15)
BUN: 16 mg/dL (ref 8–23)
CO2: 28 mmol/L (ref 22–32)
Calcium: 9.5 mg/dL (ref 8.9–10.3)
Chloride: 109 mmol/L (ref 98–111)
Creatinine, Ser: 0.74 mg/dL (ref 0.44–1.00)
GFR, Estimated: >60 mL/min (ref >60)
Glucose, Bld: 148 mg/dL — ABNORMAL HIGH (ref 70–99)
Potassium: 3.8 mmol/L (ref 3.5–5.1)
Sodium: 144 mmol/L (ref 135–145)

## 2024-10-08 LAB — MAGNESIUM: Magnesium: 1.8 mg/dL (ref 1.7–2.4)

## 2024-10-08 LAB — LACTIC ACID, PLASMA: Lactic Acid, Venous: 0.9 mmol/L (ref 0.5–1.9)

## 2024-10-08 MED ORDER — LANTUS SOLOSTAR 100 UNIT/ML ~~LOC~~ SOPN: 8.0000 [IU] | PEN_INJECTOR | Freq: Every day | SUBCUTANEOUS | Status: AC

## 2024-10-08 MED ORDER — SULFAMETHOXAZOLE-TRIMETHOPRIM 800-160 MG PO TABS: 1.0000 | ORAL_TABLET | Freq: Two times a day (BID) | ORAL | Status: AC

## 2024-10-08 MED ORDER — CEFADROXIL 500 MG PO CAPS: 500.0000 mg | ORAL_CAPSULE | Freq: Two times a day (BID) | ORAL | Status: DC

## 2024-10-08 NOTE — TOC Transition Note (Addendum)
 Transition of Care [facility]) - Discharge Note   Patient Details  Name: Mary Swanson MRN: [id number] Date of Birth: Mar 23, 1956  [facility]) CM/SW Contact:  Bascom Service, RN Phone Number: 10/08/2024, 1:59 PM   Clinical Narrative:  ROYCE Knee adm coordinator for [facility] accepted for return back,received all updated info-going to [facility]-rm#2,report tel#[phone]. [facility] called. No further CM needs.When [facility] called they said already called in.    Final next level of care: Memory Care Barriers to Discharge: No Barriers Identified   Patient Goals and CMS Choice            Discharge Placement                       Discharge Plan and Services Additional resources added to the After Visit Summary for                                       Social Drivers of Health (SDOH) Interventions SDOH Screenings   Food Insecurity: No Food Insecurity (10/07/2024)  Housing: Low Risk  (10/07/2024)  Transportation Needs: Patient Unable To Answer (10/07/2024)  Utilities: Patient Unable To Answer (10/07/2024)  Depression (PHQ2-9): Low Risk  (10/01/2019)  Social Connections: Patient Unable To Answer (10/07/2024)  Tobacco Use: Low Risk  (08/06/2024)     Readmission Risk Interventions     No data to display

## 2024-10-08 NOTE — Discharge Summary (Addendum)
 Physician Discharge Summary   Patient: Mary Swanson MRN: [id number] DOB: Mar 27, 1956  Admit date:     10/06/2024  Discharge date: 10/08/24  Discharge Physician: Lonni SHAUNNA Dalton   PCP: Patient, No Pcp Per     Recommendations at discharge:  Complete 3 more days Bactrim for UTI Check blood pressure daily, and titrate antihypertensives as appropriate Glargine insulin  dose reduced, titrate further as appropriate     Discharge Diagnoses: Principal Problem:   Acute cystitis Active Problems:   Dementia   Hypernatremia   Hypoglycemia   Diabetes   Hypertension   Hyperlipidemia   Chronic left foot wound   Iron deficiency anemia     Hospital Course: 68 y.o. F with dementia (nonverbal at baseline), chronic left foot wound, DM2, HTN, HLD, IDA who presented for fever       Urinary tract infection Lactic acidosis The patient presented with fever.  In the hospital, she had no further fever.  Blood cultures had no growth.  X-ray showed no evidence of pneumonia, and radiograph of the left foot showed no evidence of osteomyelitis.  Urine culture growing gram-negative rods, she improved with Rocephin , discharged with Bactrim to complete 3 more days treatment.        Rash I suspect this bullous rash is a reaction to cephalosporins, as it developed after treatment with ceftriaxone  here, and a similar rash developed after Keflex last Sept. - Monitor clinically   Hypernatremia Sodium 150 on arrival, treated with IV fluids and resolved.   Poor prognostic sign.   Hypoglycemia History of DM2 Hemoglobin A1c 5.7 on 08/06/2024.  Home Lantus  was initially held, and she had hyperglycemia.  She was discharged on a lower dose of Lantus , and her home metformin .      HTN Blood pressure labile.  Continued home amlodipine  and metoprolol .   HLD On Crestor    Iron deficiency anemia Hemoglobin stable relative to baseline   Chronic left foot wound No change from baseline    Dementia Evaluated by speech therapy, appears to be at baseline, recommended finger foods, no concentrated sweets, continued thin liquids.           The [place]  Controlled Substances Registry was reviewed for this patient prior to discharge.     Disposition: Long term care facility Diet recommendation:  Discharge Diet Orders (From admission, onward)     Start     Ordered   10/08/24 0000  Diet - low sodium heart healthy        10/08/24 0917             DISCHARGE MEDICATION: Allergies as of 10/08/2024       Reactions   Erythromycin Diarrhea   Latex Hives   Lisinopril  Cough        Medication List     PAUSE taking these medications    furosemide  20 MG tablet Wait to take this until your doctor or other care provider tells you to start again. Commonly known as: LASIX  Take 20 mg by mouth daily.       TAKE these medications    acetaminophen  325 MG tablet Commonly known as: TYLENOL  Take 650 mg by mouth every 8 (eight) hours as needed for moderate pain, fever or headache.   amLODipine  10 MG tablet Commonly known as: NORVASC  Take 1 tablet (10 mg total) by mouth daily.   cefadroxil 500 MG capsule Commonly known as: DURICEF Take 1 capsule (500 mg total) by mouth 2 (two) times daily for 5 days.  cyanocobalamin  1000 MCG tablet Take 1 tablet (1,000 mcg total) by mouth daily.   eucerin lotion Apply 1 Application topically daily.   feeding supplement (GLUCERNA SHAKE) Liqd Take 237 mLs by mouth in the morning, at noon, in the evening, and at bedtime. Between meals   ferrous sulfate  220 (44 Fe) MG/5ML solution Take 7.5 mLs by mouth daily.   glucose 4 GM chewable tablet Chew 1 tablet by mouth once as needed for low blood sugar.   Lantus  SoloStar 100 UNIT/ML Solostar Pen Generic drug: insulin  glargine Inject 8 Units into the skin daily. What changed: how much to take   LORazepam  0.5 MG tablet Commonly known as: ATIVAN  Take 1 tablet (0.5  mg total) by mouth in the morning and at bedtime.   metFORMIN  1000 MG tablet Commonly known as: GLUCOPHAGE  Take 1,000 mg by mouth 2 (two) times daily.   metoprolol  tartrate 25 MG tablet Commonly known as: LOPRESSOR  Take 0.5 tablets (12.5 mg total) by mouth 2 (two) times daily.   multivitamin with minerals Tabs tablet Take 1 tablet by mouth daily.   rosuvastatin  20 MG tablet Commonly known as: CRESTOR  Take 20 mg by mouth at bedtime.         Discharge Instructions     Diet - low sodium heart healthy   Complete by: As directed    Increase activity slowly   Complete by: As directed    No wound care   Complete by: As directed        Discharge Exam: Filed Weights   10/06/24 1436  Weight: 61 kg    General: Pt is awake, eating with nurse tech, not in acute distress Cardiovascular: RRR, nl S1-S2, no murmurs appreciated.   No LE edema.   Respiratory: Normal respiratory rate and rhythm.  CTAB without rales or wheezes. Abdominal: Abdomen soft and non-tender.  No distension or HSM.   Neuro/Psych: Strength symmetric in upper and lower extremities.  Judgment and insight appear severely impaired but at basleine.   Condition at discharge: fair  The results of significant diagnostics from this hospitalization (including imaging, microbiology, ancillary and laboratory) are listed below for reference.   Imaging Studies: VAS US  LOWER EXTREMITY VENOUS (DVT) Result Date: 10/07/2024  Lower Venous DVT Study Patient Name:  [facility]  Date of Exam:   10/07/2024 Medical Rec #: [id number]     Accession #:    [id number] Date of Birth: 1956-01-02    Patient Gender: F Patient Age:   68 years Exam Location:  [facility] Procedure:      VAS US  LOWER EXTREMITY VENOUS (DVT) Referring Phys: DAVED [facility] --------------------------------------------------------------------------------  Indications: Swelling, and Pain. Other Indications: Chronic left foot wound. Comparison Study: 08/06/24 -  Negative Performing Technologist: Ricka Sturdivant-Jones RDMS, RVT  Examination Guidelines: A complete evaluation includes B-mode imaging, spectral Doppler, color Doppler, and power Doppler as needed of all accessible portions of each vessel. Bilateral testing is considered an integral part of a complete examination. Limited examinations for reoccurring indications may be performed as noted. The reflux portion of the exam is performed with the patient in reverse Trendelenburg.  +-----+---------------+---------+-----------+----------+--------------+ RIGHTCompressibilityPhasicitySpontaneityPropertiesThrombus Aging +-----+---------------+---------+-----------+----------+--------------+ CFV  Full           Yes      Yes                                 +-----+---------------+---------+-----------+----------+--------------+ SFJ  Full                                                        +-----+---------------+---------+-----------+----------+--------------+   +---------+---------------+---------+-----------+----------+--------------+  LEFT     CompressibilityPhasicitySpontaneityPropertiesThrombus Aging +---------+---------------+---------+-----------+----------+--------------+ CFV      Full           Yes      Yes                                 +---------+---------------+---------+-----------+----------+--------------+ SFJ      Full                                                        +---------+---------------+---------+-----------+----------+--------------+ FV Prox  Full                                                        +---------+---------------+---------+-----------+----------+--------------+ FV Mid   Full           Yes      Yes                                 +---------+---------------+---------+-----------+----------+--------------+ FV DistalFull                                                         +---------+---------------+---------+-----------+----------+--------------+ PFV      Full                                                        +---------+---------------+---------+-----------+----------+--------------+ POP      Full           Yes      Yes                                 +---------+---------------+---------+-----------+----------+--------------+ PTV      Full                                                        +---------+---------------+---------+-----------+----------+--------------+ PERO     Full                                                        +---------+---------------+---------+-----------+----------+--------------+    Summary: RIGHT: - No evidence of common femoral vein obstruction.   LEFT: - There is no evidence of deep vein thrombosis in the lower extremity.  - No cystic structure found in the popliteal fossa.  *See table(s) above for measurements and observations. Electronically signed by Lonni Gaskins MD  on 10/07/2024 at 12:29:09 PM.    Final    DG Foot Complete Left Result Date: 10/06/2024 CLINICAL DATA:  Wound of the left hip. EXAM: LEFT FOOT - COMPLETE 3+ VIEW COMPARISON:  None Available. FINDINGS: No acute fracture or dislocation. The bones are mildly osteopenic. No significant arthritic change. No periosteal elevation or bone erosion. Dressing noted along the plantar aspect of the forefoot. IMPRESSION: No acute osseous abnormality. Electronically Signed   By: Vanetta Chou M.D.   On: 10/06/2024 16:38   DG Hand Complete Right Result Date: 10/06/2024 EXAM: 3 OR MORE VIEW(S) XRAY OF THE RIGHT HAND 10/06/2024 04:15:00 PM COMPARISON: None available. CLINICAL HISTORY: Hand pain. FINDINGS: BONES AND JOINTS: No acute fracture. No focal osseous lesion. No joint dislocation. SOFT TISSUES: The soft tissues are unremarkable. IMPRESSION: 1. No acute osseous abnormality. Electronically signed by: Greig Pique MD 10/06/2024 04:37 PM EST RP  Workstation: [id number]   DG Chest Port 1 View Result Date: 10/06/2024 CLINICAL DATA:  Sepsis. EXAM: PORTABLE CHEST 1 VIEW COMPARISON:  Chest radiograph dated 08/17/2024. FINDINGS: No focal consolidation, pleural effusion, pneumothorax. The cardiac silhouette is within limits. Atherosclerotic calcification of the aorta. No acute osseous pathology. Bilateral breast implants with calcified capsules. IMPRESSION: No active disease. Electronically Signed   By: Vanetta Chou M.D.   On: 10/06/2024 16:36    Microbiology: Results for orders placed or performed during the hospital encounter of 10/06/24  Resp panel by RT-PCR (RSV, Flu A&B, Covid) Anterior Nasal Swab     Status: None   Collection Time: 10/06/24  4:28 PM   Specimen: Anterior Nasal Swab  Result Value Ref Range Status   SARS Coronavirus 2 by RT PCR NEGATIVE NEGATIVE Final    Comment: (NOTE) SARS-CoV-2 target nucleic acids are NOT DETECTED.  The SARS-CoV-2 RNA is generally detectable in upper respiratory specimens during the acute phase of infection. The lowest concentration of SARS-CoV-2 viral copies this assay can detect is 138 copies/mL. A negative result does not preclude SARS-Cov-2 infection and should not be used as the sole basis for treatment or other patient management decisions. A negative result may occur with  improper specimen collection/handling, submission of specimen other than nasopharyngeal swab, presence of viral mutation(s) within the areas targeted by this assay, and inadequate number of viral copies(<138 copies/mL). A negative result must be combined with clinical observations, patient history, and epidemiological information. The expected result is Negative.  Fact Sheet for Patients:  bloggercourse.com  Fact Sheet for Healthcare Providers:  seriousbroker.it  This test is no t yet approved or cleared by the [place]  FDA and  has been authorized for  detection and/or diagnosis of SARS-CoV-2 by FDA under an Emergency Use Authorization (EUA). This EUA will remain  in effect (meaning this test can be used) for the duration of the COVID-19 declaration under Section 564(b)(1) of the Act, 21 U.S.C.section 360bbb-3(b)(1), unless the authorization is terminated  or revoked sooner.       Influenza A by PCR NEGATIVE NEGATIVE Final   Influenza B by PCR NEGATIVE NEGATIVE Final    Comment: (NOTE) The Xpert Xpress SARS-CoV-2/FLU/RSV plus assay is intended as an aid in the diagnosis of influenza from Nasopharyngeal swab specimens and should not be used as a sole basis for treatment. Nasal washings and aspirates are unacceptable for Xpert Xpress SARS-CoV-2/FLU/RSV testing.  Fact Sheet for Patients: bloggercourse.com  Fact Sheet for Healthcare Providers: seriousbroker.it  This test is not yet approved or cleared by the [place]  FDA and has  been authorized for detection and/or diagnosis of SARS-CoV-2 by FDA under an Emergency Use Authorization (EUA). This EUA will remain in effect (meaning this test can be used) for the duration of the COVID-19 declaration under Section 564(b)(1) of the Act, 21 U.S.C. section 360bbb-3(b)(1), unless the authorization is terminated or revoked.     Resp Syncytial Virus by PCR NEGATIVE NEGATIVE Final    Comment: (NOTE) Fact Sheet for Patients: bloggercourse.com  Fact Sheet for Healthcare Providers: seriousbroker.it  This test is not yet approved or cleared by the [place]  FDA and has been authorized for detection and/or diagnosis of SARS-CoV-2 by FDA under an Emergency Use Authorization (EUA). This EUA will remain in effect (meaning this test can be used) for the duration of the COVID-19 declaration under Section 564(b)(1) of the Act, 21 U.S.C. section 360bbb-3(b)(1), unless the authorization is  terminated or revoked.  Performed at [facility], [address], [place], [place] [address]   Blood Culture (routine x 2)     Status: None (Preliminary result)   Collection Time: 10/06/24  5:08 PM   Specimen: BLOOD RIGHT ARM  Result Value Ref Range Status   Specimen Description   Final    BLOOD RIGHT ARM Performed at [facility], [address], [place], [place] [address]    Special Requests   Final    BOTTLES DRAWN AEROBIC AND ANAEROBIC Blood Culture adequate volume Performed at [facility], [address] [place], [place] [address]    Culture   Final    NO GROWTH 2 DAYS Performed at [facility], [address], [place], [place] [address]    Report Status PENDING  Incomplete  Urine Culture     Status: Abnormal (Preliminary result)   Collection Time: 10/06/24  6:07 PM   Specimen: Urine, Random  Result Value Ref Range Status   Specimen Description   Final    URINE, RANDOM Performed at [facility], [address], [place], [place] [address]    Special Requests   Final    NONE Reflexed from [id number] Performed at [facility], [address] [place], [place] [address]    Culture (A)  Final    >=100,000 COLONIES/mL GRAM NEGATIVE RODS SUBCULTURED FOR ISOLATION Performed at [facility], [address] [place], [place] [address]    Report Status PENDING  Incomplete  MRSA Next Gen by PCR, Nasal     Status: Abnormal   Collection Time: 10/06/24 11:47 PM   Specimen: Nasal Mucosa; Nasal Swab  Result Value Ref Range Status   MRSA by PCR Next Gen DETECTED (A) NOT DETECTED Final    Comment: (NOTE) The GeneXpert MRSA Assay (FDA approved for NASAL specimens only), is one component of a comprehensive MRSA colonization surveillance program. It is not intended to diagnose MRSA infection nor to guide or monitor treatment for MRSA infections. Test performance is not FDA approved in  patients less than 11 years old. Performed at [facility], [address] [place], [place] [address]   Blood Culture (routine x 2)     Status: None (Preliminary result)   Collection Time: 10/07/24 12:33 AM   Specimen: BLOOD RIGHT HAND  Result Value Ref Range Status   Specimen Description   Final    BLOOD RIGHT HAND Performed at [facility], [address] [place], [place] [address]    Special Requests   Final  Blood Culture adequate volume BOTTLES DRAWN AEROBIC AND ANAEROBIC Performed at [facility], [address] [place], [place] [address]    Culture   Final    NO GROWTH 1 DAY Performed at [facility], [address] [place], [place] [address]    Report Status PENDING  Incomplete    Labs: CBC: Recent Labs  Lab 10/06/24 1721 10/06/24 2258 10/07/24 0033 10/08/24 0637  WBC 8.5 6.7 6.3 6.8  NEUTROABS 6.1  --   --   --   HGB 11.8* 10.6* 10.7* 10.8*  HCT 37.4 33.0* 34.2* 33.5*  MCV 98.9 98.2 98.6 96.3  PLT 291 263 266 261   Basic Metabolic Panel: Recent Labs  Lab 10/06/24 1721 10/06/24 2258 10/07/24 0033 10/08/24 0637  NA 150* 148* 147* 144  K 4.1 3.5 3.4* 3.8  CL 109 110 109 109  CO2 28 29 30 28   GLUCOSE 132* 58* 115* 148*  BUN 29* 29* 28* 16  CREATININE 0.95 0.78 0.77 0.74  CALCIUM  10.3 9.5 9.5 9.5  MG  --   --   --  1.8  PHOS  --   --   --  2.4*   Liver Function Tests: Recent Labs  Lab 10/06/24 1721  AST 21  ALT 24  ALKPHOS 66  BILITOT 0.6  PROT 7.4  ALBUMIN 4.0   CBG: Recent Labs  Lab 10/07/24 1636 10/07/24 2012 10/08/24 0000 10/08/24 0436 10/08/24 0757  GLUCAP 283* 294* 213* 144* 142*    Discharge time spent: approximately 25 minutes spent on discharge counseling, evaluation of patient on day of discharge, and coordination of discharge planning with nursing, social work, pharmacy and case management  Signed: Lonni SHAUNNA Dalton, MD [facility] 10/08/2024

## 2024-10-08 NOTE — Plan of Care (Signed)
  Problem: Education: Goal: Ability to describe self-care measures that may prevent or decrease complications (Diabetes Survival Skills Education) will improve Outcome: Progressing Goal: Individualized Educational Video(s) Outcome: Progressing   Problem: Metabolic: Goal: Ability to maintain appropriate glucose levels will improve Outcome: Progressing   Problem: Skin Integrity: Goal: Risk for impaired skin integrity will decrease Outcome: Progressing   Problem: Tissue Perfusion: Goal: Adequacy of tissue perfusion will improve Outcome: Progressing   Problem: Elimination: Goal: Will not experience complications related to bowel motility Outcome: Progressing Goal: Will not experience complications related to urinary retention Outcome: Progressing   Problem: Pain Managment: Goal: General experience of comfort will improve and/or be controlled Outcome: Progressing   Problem: Safety: Goal: Ability to remain free from injury will improve Outcome: Progressing   Problem: Skin Integrity: Goal: Risk for impaired skin integrity will decrease Outcome: Progressing

## 2024-10-08 NOTE — TOC Progression Note (Addendum)
 Transition of Care [facility]) - Progression Note    Patient Details  Name: Mary Swanson MRN: [id number] Date of Birth: 1956/07/19  [facility] of [facility]) CM/SW Contact  Toy LITTIE Agar, RN Phone Number:[phone]  10/08/2024, 10:09 AM  Clinical Narrative:    MD has placed discharge orders. CM has called facility [facility] and spoke with Rosina who confirms that facility is memory care and patient can return. CM will need to complete FL2 and email FL2 and discharge summary for review.CM to follow up.   1135 FL2 and discharge summary have been emailed for review. Awaiting final approval.  1233 CM attempted to follow up with Rosina Blush at [facility] to confirm that facility can accept patient. Rosina is currently in a meeting. Will await return call.   1300 MD sent secure chat stating that d/c summary has been updated. Updated summary has been email to [facility].     Barriers to Discharge: Continued Medical Work up               Expected Discharge Plan and Services         Expected Discharge Date: 10/08/24                                     Social Drivers of Health (SDOH) Interventions SDOH Screenings   Food Insecurity: No Food Insecurity (10/07/2024)  Housing: Low Risk  (10/07/2024)  Transportation Needs: Patient Unable To Answer (10/07/2024)  Utilities: Patient Unable To Answer (10/07/2024)  Depression (PHQ2-9): Low Risk  (10/01/2019)  Social Connections: Patient Unable To Answer (10/07/2024)  Tobacco Use: Low Risk  (08/06/2024)    Readmission Risk Interventions     No data to display

## 2024-10-08 NOTE — Plan of Care (Signed)
  Problem: Nutritional: Goal: Maintenance of adequate nutrition will improve Outcome: Progressing   Problem: Skin Integrity: Goal: Risk for impaired skin integrity will decrease Outcome: Progressing   Problem: Clinical Measurements: Goal: Ability to maintain clinical measurements within normal limits will improve Outcome: Progressing   Problem: Activity: Goal: Risk for activity intolerance will decrease Outcome: Progressing   Problem: Safety: Goal: Ability to remain free from injury will improve Outcome: Progressing

## 2024-10-08 NOTE — Hospital Course (Signed)
 68 y.o. F with dementia (nonverbal at baseline), chronic left foot wound, DM2, HTN, HLD, IDA who presented for fever

## 2024-10-08 NOTE — NC FL2 (Signed)
 [place]  MEDICAID FL2 LEVEL OF CARE FORM     IDENTIFICATION  Patient Name: Mary Swanson Birthdate: Feb 17, 1956 Sex: female Admission Date (Current Location): 10/06/2024  [facility] [place] Number:  Producer, Television/film/video and Address:  [facility],  [address], [place] [address]      Provider Number: [id number]  Attending Physician Name and Address:  Jonel Lonni SQUIBB, *  Relative Name and Phone Number:  Tilton Rouleau   [phone]    Current Level of Care: Hospital Recommended Level of Care: Memory Care Prior Approval Number:    Date Approved/Denied: 01/09/20 PASRR Number: [id number]  Discharge Plan: Other (Comment) (memory care)    Current Diagnoses: Patient Active Problem List   Diagnosis Date Noted   Fever 106 degrees F or over 10/07/2024   Fever 10/06/2024   Cellulitis 08/06/2024   Chronic diastolic CHF (congestive heart failure) (HCC) 08/06/2024   Acute kidney injury 01/31/2024   Palliative care encounter 01/09/2023   Type 2 diabetes mellitus with hyperglycemia, with long-term current use of insulin  (HCC) 06/23/2022   Uncontrolled type 2 diabetes mellitus with hyperglycemia, with long-term current use of insulin  with hyperlipidemia 01/07/2022   UTI (urinary tract infection) 06/16/2021   Alzheimer's dementia with behavioral disturbance (HCC)    Abnormal CXR    Essential hypertension    Dehydration 12/27/2019   Hyperglycemia 12/03/2019   Heart murmur, systolic 05/29/2017   DM (diabetes mellitus), type 2 (HCC) 01/24/2007   Hyperlipidemia 01/24/2007   Obesity (BMI 30-39.9) 01/24/2007   Anemia 01/24/2007   History of depression 01/24/2007   RHINITIS, ALLERGIC 01/24/2007    Orientation RESPIRATION BLADDER Height & Weight      (Dementia / nonverbal)  Normal Incontinent Weight: 61 kg Height:  5' 6 (167.6 cm)  BEHAVIORAL SYMPTOMS/MOOD NEUROLOGICAL BOWEL NUTRITION STATUS     (n/a) Incontinent Diet (Regular)  AMBULATORY STATUS  COMMUNICATION OF NEEDS Skin   Total Care Non-Verbally Other (Comment) (redness noted to face and heels)                       Personal Care Assistance Level of Assistance  Bathing, Feeding, Total care, Dressing Bathing Assistance: Maximum assistance Feeding assistance: Maximum assistance Dressing Assistance: Maximum assistance Total Care Assistance: Maximum assistance   Functional Limitations Info  Sight, Hearing, Speech Sight Info: Adequate Hearing Info: Adequate Speech Info: Impaired (nonverbal)    SPECIAL CARE FACTORS FREQUENCY                       Contractures Contractures Info: Not present    Additional Factors Info  Code Status, Allergies, Psychotropic, Insulin  Sliding Scale, Isolation Precautions, Suctioning Needs Code Status Info: DNR Allergies Info: Erythromycin, Latex, Lisinopril  Psychotropic Info: see discharge summary Insulin  Sliding Scale Info: see discharge summary Isolation Precautions Info: n/a Suctioning Needs: n/a   Current Medications (10/08/2024):  This is the current hospital active medication list Current Facility-Administered Medications  Medication Dose Route Frequency Provider Last Rate Last Admin   acetaminophen  (TYLENOL ) tablet 650 mg  650 mg Oral Q6H PRN Krugh, Marissa C, DO       Or   acetaminophen  (TYLENOL ) suppository 650 mg  650 mg Rectal Q6H PRN Krugh, Marissa C, DO       amLODipine  (NORVASC ) tablet 10 mg  10 mg Oral Daily Austria, Eric J, DO   10 mg at 10/07/24 1330   cefTRIAXone  (ROCEPHIN ) 1 g in sodium chloride  0.9 % 100 mL IVPB  1 g Intravenous Q24H Krugh, Marissa C, DO   Stopped at 10/07/24 1100   Chlorhexidine  Gluconate Cloth 2 % PADS 6 each  6 each Topical Daily Krugh, Marissa C, DO   6 each at 10/08/24 0949   dextrose  5 % in lactated ringers  infusion   Intravenous Continuous Austria, Eric J, DO 75 mL/hr at 10/08/24 0538 New Bag at 10/08/24 0538   enoxaparin  (LOVENOX ) injection 40 mg  40 mg Subcutaneous Q24H Krugh, Marissa  C, DO   40 mg at 10/07/24 1017   hydrALAZINE  (APRESOLINE ) injection 5 mg  5 mg Intravenous Q6H PRN Austria, Eric J, DO   5 mg at 10/08/24 0456   hydrocerin (EUCERIN) cream   Topical BID Austria, Eric J, DO   Given at 10/07/24 2041   insulin  aspart (novoLOG ) injection 0-9 Units  0-9 Units Subcutaneous Q4H Krugh, Marissa C, DO   1 Units at 10/08/24 9073   mupirocin  ointment (BACTROBAN ) 2 % 1 Application  1 Application Nasal BID Krugh, Marissa C, DO   1 Application at 10/07/24 2040   ondansetron  (ZOFRAN ) tablet 4 mg  4 mg Oral Q6H PRN Krugh, Marissa C, DO       Or   ondansetron  (ZOFRAN ) injection 4 mg  4 mg Intravenous Q6H PRN Krugh, Marissa C, DO       Oral care mouth rinse  15 mL Mouth Rinse 4 times per day Krugh, Marissa C, DO   15 mL at 10/08/24 9051   Oral care mouth rinse  15 mL Mouth Rinse PRN Krugh, Marissa C, DO         Discharge Medications: Please see discharge summary for a list of discharge medications.  Relevant Imaging Results:  Relevant Lab Results:   Additional Information SS#  [id number]  Toy LITTIE Agar, RN

## 2024-10-08 NOTE — Progress Notes (Addendum)
 Patient is non-verbal, transported by [facility] to [facility]. This nurse called the number provided by [facility] to give report to the receiving nurse and all three attempts went into Voice Mail. I will continue to try .  15:07: This nurse called and gave report to Rosina, RN  Engineer, Civil (consulting) of Nursing). No further concerns at this time.

## 2024-10-10 LAB — URINE CULTURE: Culture: >=100000 — AB

## 2024-10-11 LAB — CULTURE, BLOOD (ROUTINE X 2)
Culture: NO GROWTH
Special Requests: ADEQUATE

## 2024-10-12 LAB — CULTURE, BLOOD (ROUTINE X 2)
Culture: NO GROWTH
Special Requests: ADEQUATE

## 2024-12-03 ENCOUNTER — Emergency Department (HOSPITAL_COMMUNITY)

## 2024-12-03 ENCOUNTER — Other Ambulatory Visit: Payer: Self-pay

## 2024-12-03 ENCOUNTER — Emergency Department (HOSPITAL_COMMUNITY)
Admission: EM | Admit: 2024-12-03 | Discharge: 2024-12-04 | Disposition: A | Discharge status: Home / Self Care | Source: Skilled Nursing Facility | Attending: Emergency Medicine | Admitting: Emergency Medicine

## 2024-12-03 DIAGNOSIS — Z79899 Other long term (current) drug therapy: Secondary | ICD-10-CM | POA: Diagnosis not present

## 2024-12-03 DIAGNOSIS — Z794 Long term (current) use of insulin: Secondary | ICD-10-CM | POA: Insufficient documentation

## 2024-12-03 DIAGNOSIS — I5032 Chronic diastolic (congestive) heart failure: Secondary | ICD-10-CM | POA: Insufficient documentation

## 2024-12-03 DIAGNOSIS — R5383 Other fatigue: Secondary | ICD-10-CM | POA: Insufficient documentation

## 2024-12-03 DIAGNOSIS — Z7984 Long term (current) use of oral hypoglycemic drugs: Secondary | ICD-10-CM | POA: Diagnosis not present

## 2024-12-03 DIAGNOSIS — G309 Alzheimer's disease, unspecified: Secondary | ICD-10-CM | POA: Diagnosis not present

## 2024-12-03 DIAGNOSIS — E119 Type 2 diabetes mellitus without complications: Secondary | ICD-10-CM | POA: Diagnosis not present

## 2024-12-03 DIAGNOSIS — R4182 Altered mental status, unspecified: Secondary | ICD-10-CM | POA: Diagnosis present

## 2024-12-03 DIAGNOSIS — N390 Urinary tract infection, site not specified: Secondary | ICD-10-CM

## 2024-12-03 DIAGNOSIS — I110 Hypertensive heart disease with heart failure: Secondary | ICD-10-CM | POA: Insufficient documentation

## 2024-12-03 DIAGNOSIS — Z91040 Latex allergy status: Secondary | ICD-10-CM | POA: Diagnosis not present

## 2024-12-03 DIAGNOSIS — F0280 Dementia in other diseases classified elsewhere without behavioral disturbance: Secondary | ICD-10-CM | POA: Diagnosis not present

## 2024-12-03 LAB — CBG MONITORING, ED
Glucose-Capillary: 65 mg/dL — ABNORMAL LOW (ref 70–99)
Glucose-Capillary: 84 mg/dL (ref 70–99)
Glucose-Capillary: 83 mg/dL (ref 70–99)

## 2024-12-03 LAB — I-STAT VENOUS BLOOD GAS, ED
Acid-Base Excess: 9 mmol/L — ABNORMAL HIGH (ref 0.0–2.0)
Bicarbonate: 33.1 mmol/L — ABNORMAL HIGH (ref 20.0–28.0)
Calcium, Ion: 1.13 mmol/L — ABNORMAL LOW (ref 1.15–1.40)
HCT: 34 % — ABNORMAL LOW (ref 36.0–46.0)
Hemoglobin: 11.6 g/dL — ABNORMAL LOW (ref 12.0–15.0)
O2 Saturation: 99 %
Potassium: 4 mmol/L (ref 3.5–5.1)
Sodium: 145 mmol/L (ref 135–145)
TCO2: 34 mmol/L — ABNORMAL HIGH (ref 22–32)
pCO2, Ven: 40.5 mmHg — ABNORMAL LOW (ref 44–60)
pH, Ven: 7.52 — ABNORMAL HIGH (ref 7.25–7.43)
pO2, Ven: 127 mmHg — ABNORMAL HIGH (ref 32–45)

## 2024-12-03 LAB — CBC WITH DIFFERENTIAL/PLATELET
Abs Immature Granulocytes: 0.03 K/uL (ref 0.00–0.07)
Basophils Absolute: 0 K/uL (ref 0.0–0.1)
Basophils Relative: 0 %
Eosinophils Absolute: 0.1 K/uL (ref 0.0–0.5)
Eosinophils Relative: 1 %
HCT: 36.2 % (ref 36.0–46.0)
Hemoglobin: 11.4 g/dL — ABNORMAL LOW (ref 12.0–15.0)
Immature Granulocytes: 0 %
Lymphocytes Relative: 23 %
Lymphs Abs: 1.6 K/uL (ref 0.7–4.0)
MCH: 31.4 pg (ref 26.0–34.0)
MCHC: 31.5 g/dL (ref 30.0–36.0)
MCV: 99.7 fL (ref 80.0–100.0)
Monocytes Absolute: 0.5 K/uL (ref 0.1–1.0)
Monocytes Relative: 7 %
Neutro Abs: 4.5 K/uL (ref 1.7–7.7)
Neutrophils Relative %: 69 %
Platelets: 248 K/uL (ref 150–400)
RBC: 3.63 MIL/uL — ABNORMAL LOW (ref 3.87–5.11)
RDW: 12.2 % (ref 11.5–15.5)
WBC: 6.7 K/uL (ref 4.0–10.5)
nRBC: 0 % (ref 0.0–0.2)

## 2024-12-03 LAB — URINALYSIS, W/ REFLEX TO CULTURE (INFECTION SUSPECTED)
Bilirubin Urine: NEGATIVE
Glucose, UA: NEGATIVE mg/dL
Ketones, ur: 5 mg/dL — AB
Nitrite: POSITIVE — AB
Protein, ur: 100 mg/dL — AB
Specific Gravity, Urine: 1.012 (ref 1.005–1.030)
pH: 6 (ref 5.0–8.0)

## 2024-12-03 LAB — BASIC METABOLIC PANEL WITH GFR
Anion gap: 12 (ref 5–15)
BUN: 18 mg/dL (ref 8–23)
CO2: 28 mmol/L (ref 22–32)
Calcium: 9.6 mg/dL (ref 8.9–10.3)
Chloride: 107 mmol/L (ref 98–111)
Creatinine, Ser: 0.78 mg/dL (ref 0.44–1.00)
GFR, Estimated: >60 mL/min
Glucose, Bld: 68 mg/dL — ABNORMAL LOW (ref 70–99)
Potassium: 3.8 mmol/L (ref 3.5–5.1)
Sodium: 146 mmol/L — ABNORMAL HIGH (ref 135–145)

## 2024-12-03 LAB — RESP PANEL BY RT-PCR (RSV, FLU A&B, COVID)  RVPGX2
Influenza A by PCR: NEGATIVE
Influenza B by PCR: NEGATIVE
Resp Syncytial Virus by PCR: NEGATIVE
SARS Coronavirus 2 by RT PCR: NEGATIVE

## 2024-12-03 LAB — I-STAT CHEM 8, ED
BUN: 24 mg/dL — ABNORMAL HIGH (ref 8–23)
Calcium, Ion: 1.13 mmol/L — ABNORMAL LOW (ref 1.15–1.40)
Chloride: 106 mmol/L (ref 98–111)
Creatinine, Ser: 0.9 mg/dL (ref 0.44–1.00)
Glucose, Bld: 74 mg/dL (ref 70–99)
HCT: 35 % — ABNORMAL LOW (ref 36.0–46.0)
Hemoglobin: 11.9 g/dL — ABNORMAL LOW (ref 12.0–15.0)
Potassium: 4 mmol/L (ref 3.5–5.1)
Sodium: 146 mmol/L — ABNORMAL HIGH (ref 135–145)
TCO2: 31 mmol/L (ref 22–32)

## 2024-12-03 LAB — AMMONIA: Ammonia: 17 umol/L (ref 9–35)

## 2024-12-03 LAB — TSH: TSH: 1.67 u[IU]/mL (ref 0.350–4.500)

## 2024-12-03 MED ORDER — SODIUM CHLORIDE 0.9% FLUSH: 3.0000 mL | Freq: Two times a day (BID) | INTRAVENOUS | Status: DC

## 2024-12-03 MED ORDER — CEPHALEXIN 500 MG PO CAPS: 500.0000 mg | ORAL_CAPSULE | Freq: Two times a day (BID) | ORAL | 0 refills | Status: AC

## 2024-12-03 MED ORDER — SODIUM CHLORIDE 0.9 % IV BOLUS
1000.0000 mL | Freq: Once | INTRAVENOUS | Status: AC
  Administered 2024-12-03: 1000 mL via INTRAVENOUS

## 2024-12-03 MED ORDER — SODIUM CHLORIDE 0.9 % IV SOLN
1.0000 g | Freq: Once | INTRAVENOUS | Status: AC
  Administered 2024-12-03: 1 g via INTRAVENOUS
  Filled 2024-12-03: qty 10

## 2024-12-03 MED ORDER — HYDRALAZINE HCL 20 MG/ML IJ SOLN
5.0000 mg | Freq: Once | INTRAMUSCULAR | Status: AC
  Administered 2024-12-03: 5 mg via INTRAVENOUS
  Filled 2024-12-03: qty 1

## 2024-12-03 MED ORDER — SODIUM CHLORIDE 0.9 % IV SOLN: 250.0000 mL | INTRAVENOUS | Status: DC | PRN

## 2024-12-03 MED ORDER — SODIUM CHLORIDE 0.9% FLUSH: 3.0000 mL | INTRAVENOUS | Status: DC | PRN

## 2024-12-03 NOTE — ED Notes (Addendum)
 Freddi, MD made aware of 34 CBG-- instructed to attempt food and drink. 8oz of apple juice given. Patient unable to understand commands to drink juice.

## 2024-12-03 NOTE — ED Notes (Signed)
 Pts legal guardian updated on pt status

## 2024-12-03 NOTE — ED Notes (Signed)
 [facility] CALLED PICK UP WILL BE  WITHIN 90 MIN.

## 2024-12-03 NOTE — Discharge Instructions (Signed)
 Your test today do not show any signs of a stroke, kidney failure, etc.  There is a possible urinary tract infection, we are going to treat you with antibiotics.  Follow-up with your primary care provider.  Return to the ER for any new or worsening symptoms.

## 2024-12-03 NOTE — ED Notes (Signed)
 EDP made aware of rising BP.

## 2024-12-03 NOTE — ED Notes (Addendum)
 Called [address] and spoke to Elliott, CHARITY [employer]DRA[employer]ER for updated med list. Per SNF RN, BP meds were given this morning and next dose to be given tonight.

## 2024-12-03 NOTE — ED Triage Notes (Signed)
 Pt BIB [facility] coming from [facility]. Patient presents with lethargy since yesterday afternoon that has worsened today. On arrival, patient alert to pain in all extremities with urine scent on pt. Hx of dementia, baseline minimally nonverbal.   EMS: GCS 8 93 CBG 152/80 64HR 98%RA

## 2024-12-03 NOTE — ED Notes (Signed)
 Patient fed apple sauce and tolerated appropriately.

## 2024-12-03 NOTE — ED Notes (Signed)
 Legal guardian updated on pts status and notified that pt will be going back to [facility] via [facility].

## 2024-12-03 NOTE — ED Provider Notes (Signed)
 Care transferred to me.  Per report from her facility she has been less responsive today.  She reportedly got her amlodipine  and metoprolol  this morning.  Blood pressure has been high with me.  Urine shows nitrites and trace leukocytes.  Will treat for possible UTI as the cause of the change in her mental status.  Given a dose of Rocephin  here.  MRI shows no sign of stroke.  I have also viewed/interpreted these images and agree with radiology.  Her blood pressure has been high, at 1 point 200 systolic.  Chart review shows her blood pressure is frequently elevated.  Given 1 dose of hydralazine  which did moderately lower it.  However I do not think this is a hypertensive crisis or pres.  Discussed with the guardian, will treat for this possible UTI and discharged back to her facility as I think she is at her baseline, opens her eyes and follows commands but does not speak much to me.   Mary Hamilton, MD 12/03/24 2157

## 2024-12-03 NOTE — ED Provider Notes (Signed)
 " [place] [facility] Provider Note  CSN: [id number] Arrival date & time: 12/03/24 1315  Chief Complaint(s) Altered Mental Status  HPI Mary Swanson is a 69 y.o. female who is here today for lethargy.  Patient with history of dementia, comes from [facility].  She is nonverbal at baseline.  Her blood sugar for EMS was normal, was normotensive nontachycardic and nontachypneic.   Past Medical History Past Medical History:  Diagnosis Date   ACE-inhibitor cough 03/14/2019   Acute lower UTI 06/16/2021   Acute metabolic encephalopathy 12/28/2019   AKI (acute kidney injury) 12/27/2019   Altered mental status 06/15/2021   Dehydration 12/27/2019   Diabetes mellitus (HCC) 12/28/2019   Diabetes mellitus without complication (HCC)    Diabetic foot infection (HCC) 01/06/2022   Hypernatremia 06/16/2021   Hypertension    Memory loss of unknown cause 12/03/2019   Sepsis due to Streptococcus pyogenes due to right foot cellulitis 01/06/2022   Transaminitis 01/06/2022   Patient Active Problem List   Diagnosis Date Noted   Fever 106 degrees F or over 10/07/2024   Fever 10/06/2024   Cellulitis 08/06/2024   Chronic diastolic CHF (congestive heart failure) (HCC) 08/06/2024   Acute kidney injury 01/31/2024   Palliative care encounter 01/09/2023   Type 2 diabetes mellitus with hyperglycemia, with long-term current use of insulin  (HCC) 06/23/2022   Uncontrolled type 2 diabetes mellitus with hyperglycemia, with long-term current use of insulin  with hyperlipidemia 01/07/2022   UTI (urinary tract infection) 06/16/2021   Alzheimer's dementia with behavioral disturbance (HCC)    Abnormal CXR    Essential hypertension    Dehydration 12/27/2019   Hyperglycemia 12/03/2019   Heart murmur, systolic 05/29/2017   DM (diabetes mellitus), type 2 (HCC) 01/24/2007   Hyperlipidemia 01/24/2007   Obesity (BMI 30-39.9) 01/24/2007   Anemia 01/24/2007   History of depression 01/24/2007    RHINITIS, ALLERGIC 01/24/2007   Home Medication(s) Prior to Admission medications  Medication Sig Start Date End Date Taking? Authorizing Provider  acetaminophen  (TYLENOL ) 325 MG tablet Take 650 mg by mouth every 8 (eight) hours as needed for moderate pain, fever or headache.    [provider]  amLODipine  (NORVASC ) 10 MG tablet Take 1 tablet (10 mg total) by mouth daily. 01/02/20   Sebastian Toribio GAILS, MD  cyanocobalamin  1000 MCG tablet Take 1 tablet (1,000 mcg total) by mouth daily. 08/14/24   Danford, Lonni SQUIBB, MD  Emollient (EUCERIN) lotion Apply 1 Application topically daily.    [provider]  feeding supplement, GLUCERNA SHAKE, (GLUCERNA SHAKE) LIQD Take 237 mLs by mouth in the morning, at noon, in the evening, and at bedtime. Between meals    [provider]  ferrous sulfate  220 (44 Fe) MG/5ML solution Take 7.5 mLs by mouth daily.    [provider]  [Paused] furosemide  (LASIX ) 20 MG tablet Take 20 mg by mouth daily. Wait to take this until your doctor or other care provider tells you to start again. 07/29/24   [provider]  glucose 4 GM chewable tablet Chew 1 tablet by mouth once as needed for low blood sugar.    [provider]  insulin  glargine (LANTUS  SOLOSTAR) 100 UNIT/ML Solostar Pen Inject 8 Units into the skin daily. 10/08/24   Danford, Lonni SQUIBB, MD  LORazepam  (ATIVAN ) 0.5 MG tablet Take 1 tablet (0.5 mg total) by mouth in the morning and at bedtime. 02/07/24   Amin, Ankit C, MD  metFORMIN  (GLUCOPHAGE ) 1000 MG tablet  Take 1,000 mg by mouth 2 (two) times daily. 01/14/24   [provider]  metoprolol  tartrate (LOPRESSOR ) 25 MG tablet Take 0.5 tablets (12.5 mg total) by mouth 2 (two) times daily. 08/13/24   Danford, Lonni SQUIBB, MD  Multiple Vitamin (MULTIVITAMIN WITH MINERALS) TABS tablet Take 1 tablet by mouth daily. 01/02/20   Sebastian Toribio GAILS, MD  rosuvastatin  (CRESTOR ) 20 MG tablet Take 20 mg by mouth at  bedtime.    [provider]                                                                                                                                    Past Surgical History Past Surgical History:  Procedure Laterality Date   CATARACT EXTRACTION, BILATERAL Bilateral    Family History Family History  Problem Relation Age of Onset   Diabetes Neg Hx     Social History Social History[1] Allergies Erythromycin, Latex, and Lisinopril   Review of Systems Review of Systems  Physical Exam Vital Signs  I have reviewed the triage vital signs BP (!) 165/63   Pulse 66   Temp 98.4 F (36.9 C) (Oral)   Resp 15   LMP  (LMP Unknown)   SpO2 100%   Physical Exam Vitals and nursing note reviewed.  Constitutional:      Appearance: She is not toxic-appearing.  HENT:     Head: Normocephalic and atraumatic.     Mouth/Throat:     Mouth: Mucous membranes are moist.  Cardiovascular:     Rate and Rhythm: Normal rate.  Pulmonary:     Effort: Pulmonary effort is normal.  Abdominal:     General: Abdomen is flat.     Palpations: Abdomen is soft.  Musculoskeletal:        General: No swelling or deformity.     Cervical back: Normal range of motion.  Neurological:     Comments: Patient responds to voice, is unable to follow commands.  This reportedly is patient's baseline.     ED Results and Treatments Labs (all labs ordered are listed, but only abnormal results are displayed) Labs Reviewed  I-STAT CHEM 8, ED - Abnormal; Notable for the following components:      Result Value   Sodium 146 (*)    BUN 24 (*)    Calcium , Ion 1.13 (*)    Hemoglobin 11.9 (*)    HCT 35.0 (*)    All other components within normal limits  I-STAT VENOUS BLOOD GAS, ED - Abnormal; Notable for the following components:   pH, Ven 7.520 (*)    pCO2, Ven 40.5 (*)    pO2, Ven 127 (*)    Bicarbonate 33.1 (*)    TCO2 34 (*)    Acid-Base Excess 9.0 (*)    Calcium , Ion 1.13 (*)    HCT 34.0 (*)     Hemoglobin 11.6 (*)    All other components within normal  limits  RESP PANEL BY RT-PCR (RSV, FLU A&B, COVID)  RVPGX2  BASIC METABOLIC PANEL WITH GFR  CBC WITH DIFFERENTIAL/PLATELET  AMMONIA  TSH  URINALYSIS, W/ REFLEX TO CULTURE (INFECTION SUSPECTED)                                                                                                                          Radiology CT Head Wo Contrast Result Date: 12/03/2024 EXAM: CT HEAD WITHOUT CONTRAST 12/03/2024 01:51:00 PM TECHNIQUE: CT of the head was performed without the administration of intravenous contrast. Automated exposure control, iterative reconstruction, and/or weight based adjustment of the mA/kV was utilized to reduce the radiation dose to as low as reasonably achievable. COMPARISON: 08/17/2024 CLINICAL HISTORY: Delirium FINDINGS: BRAIN AND VENTRICLES: No acute hemorrhage. No evidence of acute infarct. Patchy and confluent areas of decreased attenuation throughout the deep and periventricular white matter of the cerebral hemispheres bilaterally, compatible with chronic microvascular ischemic disease. Cerebral ventricle sizes concordant with degree of cerebral volume loss. No hydrocephalus. No extra-axial collection. No mass effect or midline shift. Atherosclerotic calcifications within the cavernous internal carotid arteries. ORBITS: Bilateral lens replacement noted. No acute abnormality. SINUSES: No acute abnormality. SOFT TISSUES AND SKULL: No acute soft tissue abnormality. No skull fracture. IMPRESSION: 1. No acute intracranial abnormality. Electronically signed by: Norman Gatlin MD 12/03/2024 02:07 PM EST RP Workstation: [id number]   DG Chest Portable 1 View Result Date: 12/03/2024 EXAM: 1 VIEW(S) XRAY OF THE CHEST 12/03/2024 01:32:00 PM COMPARISON: 10/06/2024 CLINICAL HISTORY: altered FINDINGS: LUNGS AND PLEURA: No focal pulmonary opacity. No pleural effusion. No pneumothorax. HEART AND MEDIASTINUM: Atherosclerotic plaque. No acute  abnormality of the cardiac and mediastinal silhouettes. BONES AND SOFT TISSUES: Calcified breast prostheses. No acute osseous abnormality. IMPRESSION: 1. No acute findings. Electronically signed by: Norman Gatlin MD 12/03/2024 01:47 PM EST RP Workstation: [id number]    Pertinent labs & imaging results that were available during my care of the patient were reviewed by me and considered in my medical decision making (see MDM for details).  Medications Ordered in ED Medications  sodium chloride  0.9 % bolus 1,000 mL (has no administration in time range)                                                                                                                                     Procedures Procedures  (including critical care time)  Medical Decision Making /  ED Course   This patient presents to the ED for concern of lethargy, this involves an extensive number of treatment options, and is a complaint that carries with it a high risk of complications and morbidity.  The differential diagnosis includes electrolyte abnormalities, dehydration, CVA, UTI.  MDM: On exam, patient does appear to be any significant distress.  She is overall well-appearing.  There is no signs of trauma.  She has normal vital signs.  Patient has dementia is nonverbal at baseline.  This reportedly is a deviation from the patient's baseline, however she looks quite well to me.  Will obtain imaging the patient's head, plain films, urinalysis, blood work.  This patient will be signed out to Dr. Franklyn pending workup and disposition.   Additional history obtained: -Additional history obtained from EMS -External records from outside source obtained and reviewed including: Chart review including previous notes, labs, imaging, consultation notes   Lab Tests: -I ordered, reviewed, and interpreted labs.   The pertinent results include:   Labs Reviewed  I-STAT CHEM 8, ED - Abnormal; Notable for the following components:       Result Value   Sodium 146 (*)    BUN 24 (*)    Calcium , Ion 1.13 (*)    Hemoglobin 11.9 (*)    HCT 35.0 (*)    All other components within normal limits  I-STAT VENOUS BLOOD GAS, ED - Abnormal; Notable for the following components:   pH, Ven 7.520 (*)    pCO2, Ven 40.5 (*)    pO2, Ven 127 (*)    Bicarbonate 33.1 (*)    TCO2 34 (*)    Acid-Base Excess 9.0 (*)    Calcium , Ion 1.13 (*)    HCT 34.0 (*)    Hemoglobin 11.6 (*)    All other components within normal limits  RESP PANEL BY RT-PCR (RSV, FLU A&B, COVID)  RVPGX2  BASIC METABOLIC PANEL WITH GFR  CBC WITH DIFFERENTIAL/PLATELET  AMMONIA  TSH  URINALYSIS, W/ REFLEX TO CULTURE (INFECTION SUSPECTED)    Imaging Studies ordered: I ordered imaging studies including CT head, chest x-ray I independently visualized and interpreted imaging. I agree with the radiologist interpretation   Medicines ordered and prescription drug management: Meds ordered this encounter  Medications   sodium chloride  0.9 % bolus 1,000 mL    -I have reviewed the patients home medicines and have made adjustments as needed  Cardiac Monitoring: The patient was maintained on a cardiac monitor.  I personally viewed and interpreted the cardiac monitored which showed an underlying rhythm of: Sinus rhythm  Social Determinants of Health:  Factors impacting patients care include: Lack of access to primary care   Reevaluation: After the interventions noted above, I reevaluated the patient and found that they have :improved  Co morbidities that complicate the patient evaluation  Past Medical History:  Diagnosis Date   ACE-inhibitor cough 03/14/2019   Acute lower UTI 06/16/2021   Acute metabolic encephalopathy 12/28/2019   AKI (acute kidney injury) 12/27/2019   Altered mental status 06/15/2021   Dehydration 12/27/2019   Diabetes mellitus (HCC) 12/28/2019   Diabetes mellitus without complication (HCC)    Diabetic foot infection (HCC) 01/06/2022    Hypernatremia 06/16/2021   Hypertension    Memory loss of unknown cause 12/03/2019   Sepsis due to Streptococcus pyogenes due to right foot cellulitis 01/06/2022   Transaminitis 01/06/2022         Final Clinical Impression(s) / ED Diagnoses Final diagnoses:  Lethargy     @  PCDICTATION@     [1]  Social History Tobacco Use   Smoking status: Never   Smokeless tobacco: Never  Vaping Use   Vaping status: Never Used  Substance Use Topics   Alcohol use: Not Currently   Drug use: Never     Mannie Pac T, DO 12/03/24 1436  "

## 2024-12-03 NOTE — ED Notes (Signed)
 Phlebotomy asked to obtain remaining blood samples.

## 2024-12-05 LAB — URINE CULTURE: Culture: >=100000 — AB

## 2024-12-06 ENCOUNTER — Telehealth (HOSPITAL_BASED_OUTPATIENT_CLINIC_OR_DEPARTMENT_OTHER): Payer: Self-pay | Admitting: *Deleted

## 2024-12-06 NOTE — Telephone Encounter (Signed)
 Post ED Visit - Positive Culture Follow-up  Culture report reviewed by antimicrobial stewardship pharmacist: [facility] []  Rankin Dee, Pharm.D. []  Venetia Gully, Pharm.D., BCPS AQ-ID []  Garrel Crews, Pharm.D., BCPS []  Almarie Lunger, Pharm.D., BCPS []  [place], [address].D., BCPS, AAHIVP []  Rosaline Bihari, Pharm.D., BCPS, AAHIVP []  Vernell Meier, PharmD, BCPS []  Latanya Hint, PharmD, BCPS []  Donald Medley, PharmD, BCPS []  Rocky Bold, PharmD []  Dorothyann Alert, PharmD, BCPS [x]  Dorn Poot, PharmD  [facility] []  Rosaline Edison, PharmD []  Romona Bliss, PharmD []  Dolphus Roller, PharmD []  Veva Seip, Rph []  Vernell Daunt) Leonce, PharmD []  Eva Allis, PharmD []  Rosaline Millet, PharmD []  Iantha Batch, PharmD []  Arvin Gauss, PharmD []  Wanda Hasting, PharmD []  Ronal Rav, PharmD []  Rocky Slade, PharmD []  Bard Jeans, PharmD   Positive urine culture Treated with Cephalexin , organism sensitive to the same and no further patient follow-up is required at this time.  Albino Alan Novak 12/06/2024, 10:53 AM

## 2025-01-01 NOTE — Progress Notes (Signed)
 Viral panel ordered to assess for viral illness.
